# Patient Record
Sex: Female | Born: 1975
Health system: Southern US, Community
[De-identification: ages and names within clinical notes are randomized; demographics above are authoritative.]

## PROBLEM LIST (undated history)

## (undated) DIAGNOSIS — F32A Depression, unspecified: Secondary | ICD-10-CM

## (undated) DIAGNOSIS — E782 Mixed hyperlipidemia: Secondary | ICD-10-CM

## (undated) DIAGNOSIS — M542 Cervicalgia: Secondary | ICD-10-CM

## (undated) DIAGNOSIS — J309 Allergic rhinitis, unspecified: Secondary | ICD-10-CM

## (undated) DIAGNOSIS — I10 Essential (primary) hypertension: Secondary | ICD-10-CM

## (undated) DIAGNOSIS — G43909 Migraine, unspecified, not intractable, without status migrainosus: Secondary | ICD-10-CM

## (undated) DIAGNOSIS — Z8711 Personal history of peptic ulcer disease: Secondary | ICD-10-CM

## (undated) DIAGNOSIS — E538 Deficiency of other specified B group vitamins: Secondary | ICD-10-CM

## (undated) DIAGNOSIS — K219 Gastro-esophageal reflux disease without esophagitis: Secondary | ICD-10-CM

## (undated) DIAGNOSIS — E611 Iron deficiency: Secondary | ICD-10-CM

## (undated) DIAGNOSIS — R569 Unspecified convulsions: Secondary | ICD-10-CM

## (undated) DIAGNOSIS — R55 Syncope and collapse: Secondary | ICD-10-CM

## (undated) DIAGNOSIS — Z8619 Personal history of other infectious and parasitic diseases: Secondary | ICD-10-CM

## (undated) DIAGNOSIS — F419 Anxiety disorder, unspecified: Secondary | ICD-10-CM

## (undated) DIAGNOSIS — K909 Intestinal malabsorption, unspecified: Secondary | ICD-10-CM

## (undated) DIAGNOSIS — E559 Vitamin D deficiency, unspecified: Secondary | ICD-10-CM

## (undated) DIAGNOSIS — F329 Major depressive disorder, single episode, unspecified: Secondary | ICD-10-CM

## (undated) DIAGNOSIS — E161 Other hypoglycemia: Secondary | ICD-10-CM

## (undated) DIAGNOSIS — Z8719 Personal history of other diseases of the digestive system: Secondary | ICD-10-CM

## (undated) DIAGNOSIS — G8929 Other chronic pain: Secondary | ICD-10-CM

## (undated) DIAGNOSIS — E162 Hypoglycemia, unspecified: Secondary | ICD-10-CM

## (undated) DIAGNOSIS — R4182 Altered mental status, unspecified: Secondary | ICD-10-CM

## (undated) DIAGNOSIS — N926 Irregular menstruation, unspecified: Secondary | ICD-10-CM

## (undated) DIAGNOSIS — R238 Other skin changes: Secondary | ICD-10-CM

## (undated) HISTORY — DX: Altered mental status, unspecified: R41.82

## (undated) HISTORY — DX: Other hypoglycemia: E16.1

## (undated) HISTORY — DX: Mixed hyperlipidemia: E78.2

## (undated) HISTORY — PX: EYE SURGERY: SHX253

## (undated) HISTORY — PX: ENDOMETRIAL BIOPSY: SHX622

## (undated) HISTORY — DX: Intestinal malabsorption, unspecified: K90.9

## (undated) HISTORY — DX: Gastro-esophageal reflux disease without esophagitis: K21.9

## (undated) HISTORY — DX: Deficiency of other specified B group vitamins: E53.8

## (undated) HISTORY — DX: Irregular menstruation, unspecified: N92.6

## (undated) HISTORY — DX: Personal history of other infectious and parasitic diseases: Z86.19

## (undated) HISTORY — DX: Migraine, unspecified, not intractable, without status migrainosus: G43.909

## (undated) HISTORY — DX: Iron deficiency: E61.1

## (undated) HISTORY — DX: Unspecified convulsions: R56.9

## (undated) HISTORY — DX: Syncope and collapse: R55

## (undated) HISTORY — DX: Depression, unspecified: F32.A

## (undated) HISTORY — DX: Personal history of peptic ulcer disease: Z87.11

## (undated) HISTORY — DX: Hypoglycemia, unspecified: E16.2

## (undated) HISTORY — DX: Allergic rhinitis, unspecified: J30.9

## (undated) HISTORY — DX: Cervicalgia: M54.2

## (undated) HISTORY — PX: OTHER SURGICAL HISTORY: SHX169

## (undated) HISTORY — PX: TONSILLECTOMY AND ADENOIDECTOMY: SHX28

## (undated) HISTORY — DX: Major depressive disorder, single episode, unspecified: F32.9

## (undated) HISTORY — DX: Anxiety disorder, unspecified: F41.9

## (undated) HISTORY — DX: Essential (primary) hypertension: I10

## (undated) HISTORY — PX: COLONOSCOPY W/ POLYPECTOMY: SHX1380

## (undated) HISTORY — DX: Personal history of other diseases of the digestive system: Z87.19

## (undated) HISTORY — DX: Vitamin D deficiency, unspecified: E55.9

## (undated) HISTORY — DX: Other chronic pain: G89.29

## (undated) HISTORY — DX: Other skin changes: R23.8

## (undated) MED FILL — Medication: Fill #1 | Status: CN

---

## 1998-09-07 ENCOUNTER — Other Ambulatory Visit: Admission: RE | Admit: 1998-09-07 | Discharge: 1998-09-07 | Payer: Self-pay | Admitting: *Deleted

## 1998-10-25 ENCOUNTER — Encounter: Payer: Self-pay | Admitting: Obstetrics and Gynecology

## 1998-10-25 ENCOUNTER — Ambulatory Visit (HOSPITAL_COMMUNITY): Admission: RE | Admit: 1998-10-25 | Discharge: 1998-10-25 | Payer: Self-pay | Admitting: Obstetrics and Gynecology

## 1999-03-07 ENCOUNTER — Inpatient Hospital Stay (HOSPITAL_COMMUNITY): Admission: AD | Admit: 1999-03-07 | Discharge: 1999-03-07 | Payer: Self-pay | Admitting: Obstetrics and Gynecology

## 1999-03-07 ENCOUNTER — Encounter: Payer: Self-pay | Admitting: Obstetrics and Gynecology

## 1999-03-15 ENCOUNTER — Encounter (HOSPITAL_COMMUNITY): Admission: RE | Admit: 1999-03-15 | Discharge: 1999-03-26 | Payer: Self-pay | Admitting: Obstetrics and Gynecology

## 1999-03-25 ENCOUNTER — Inpatient Hospital Stay (HOSPITAL_COMMUNITY): Admission: AD | Admit: 1999-03-25 | Discharge: 1999-03-27 | Payer: Self-pay | Admitting: Obstetrics and Gynecology

## 1999-03-25 ENCOUNTER — Encounter (INDEPENDENT_AMBULATORY_CARE_PROVIDER_SITE_OTHER): Payer: Self-pay | Admitting: Specialist

## 2000-03-28 ENCOUNTER — Other Ambulatory Visit: Admission: RE | Admit: 2000-03-28 | Discharge: 2000-03-28 | Payer: Self-pay | Admitting: Obstetrics and Gynecology

## 2000-10-07 HISTORY — PX: GASTRIC BYPASS: SHX52

## 2001-04-01 ENCOUNTER — Other Ambulatory Visit: Admission: RE | Admit: 2001-04-01 | Discharge: 2001-04-01 | Payer: Self-pay | Admitting: Obstetrics and Gynecology

## 2001-04-10 ENCOUNTER — Encounter: Payer: Self-pay | Admitting: Obstetrics and Gynecology

## 2001-04-10 ENCOUNTER — Encounter: Admission: RE | Admit: 2001-04-10 | Discharge: 2001-04-10 | Payer: Self-pay | Admitting: Obstetrics and Gynecology

## 2002-01-05 HISTORY — PX: ROUX-EN-Y GASTRIC BYPASS: SHX1104

## 2002-04-01 ENCOUNTER — Other Ambulatory Visit: Admission: RE | Admit: 2002-04-01 | Discharge: 2002-04-01 | Payer: Self-pay | Admitting: Obstetrics and Gynecology

## 2003-03-09 ENCOUNTER — Other Ambulatory Visit: Admission: RE | Admit: 2003-03-09 | Discharge: 2003-03-09 | Payer: Self-pay | Admitting: Obstetrics and Gynecology

## 2004-03-13 ENCOUNTER — Other Ambulatory Visit: Admission: RE | Admit: 2004-03-13 | Discharge: 2004-03-13 | Payer: Self-pay | Admitting: Obstetrics and Gynecology

## 2004-06-27 ENCOUNTER — Ambulatory Visit (HOSPITAL_COMMUNITY): Admission: RE | Admit: 2004-06-27 | Discharge: 2004-06-27 | Payer: Self-pay | Admitting: Obstetrics and Gynecology

## 2004-10-07 HISTORY — PX: TUBAL LIGATION: SHX77

## 2004-10-07 HISTORY — PX: OTHER SURGICAL HISTORY: SHX169

## 2004-10-07 LAB — HM MAMMOGRAPHY: HM Mammogram: NORMAL

## 2004-11-06 ENCOUNTER — Inpatient Hospital Stay (HOSPITAL_COMMUNITY): Admission: AD | Admit: 2004-11-06 | Discharge: 2004-11-10 | Payer: Self-pay | Admitting: Obstetrics and Gynecology

## 2004-11-07 ENCOUNTER — Encounter (INDEPENDENT_AMBULATORY_CARE_PROVIDER_SITE_OTHER): Payer: Self-pay | Admitting: *Deleted

## 2004-11-11 ENCOUNTER — Encounter: Admission: RE | Admit: 2004-11-11 | Discharge: 2004-12-11 | Payer: Self-pay | Admitting: Obstetrics and Gynecology

## 2004-11-16 ENCOUNTER — Encounter: Admission: RE | Admit: 2004-11-16 | Discharge: 2004-11-16 | Payer: Self-pay | Admitting: Obstetrics and Gynecology

## 2005-03-15 ENCOUNTER — Other Ambulatory Visit: Admission: RE | Admit: 2005-03-15 | Discharge: 2005-03-15 | Payer: Self-pay | Admitting: Obstetrics and Gynecology

## 2005-06-08 ENCOUNTER — Encounter (INDEPENDENT_AMBULATORY_CARE_PROVIDER_SITE_OTHER): Payer: Self-pay | Admitting: Specialist

## 2005-06-08 ENCOUNTER — Inpatient Hospital Stay (HOSPITAL_COMMUNITY): Admission: RE | Admit: 2005-06-08 | Discharge: 2005-06-10 | Payer: Self-pay | Admitting: Obstetrics and Gynecology

## 2005-06-24 ENCOUNTER — Inpatient Hospital Stay (HOSPITAL_COMMUNITY): Admission: EM | Admit: 2005-06-24 | Discharge: 2005-06-24 | Payer: Self-pay | Admitting: Diagnostic Radiology

## 2006-08-04 ENCOUNTER — Other Ambulatory Visit: Admission: RE | Admit: 2006-08-04 | Discharge: 2006-08-04 | Payer: Self-pay | Admitting: Family Medicine

## 2007-05-13 ENCOUNTER — Other Ambulatory Visit: Admission: RE | Admit: 2007-05-13 | Discharge: 2007-05-13 | Payer: Self-pay | Admitting: Family Medicine

## 2008-08-19 ENCOUNTER — Other Ambulatory Visit: Admission: RE | Admit: 2008-08-19 | Discharge: 2008-08-19 | Payer: Self-pay | Admitting: Family Medicine

## 2009-07-04 ENCOUNTER — Emergency Department (HOSPITAL_COMMUNITY): Admission: EM | Admit: 2009-07-04 | Discharge: 2009-07-04 | Payer: Self-pay | Admitting: Emergency Medicine

## 2009-08-21 ENCOUNTER — Other Ambulatory Visit: Admission: RE | Admit: 2009-08-21 | Discharge: 2009-08-21 | Payer: Self-pay | Admitting: Family Medicine

## 2009-10-07 DIAGNOSIS — R238 Other skin changes: Secondary | ICD-10-CM

## 2009-10-07 DIAGNOSIS — R233 Spontaneous ecchymoses: Secondary | ICD-10-CM

## 2009-10-07 HISTORY — DX: Spontaneous ecchymoses: R23.3

## 2009-10-07 HISTORY — DX: Other skin changes: R23.8

## 2009-12-05 ENCOUNTER — Ambulatory Visit: Payer: Self-pay | Admitting: Oncology

## 2009-12-13 LAB — CBC WITH DIFFERENTIAL/PLATELET
BASO%: 0.4 % (ref 0.0–2.0)
EOS%: 1.1 % (ref 0.0–7.0)
HGB: 12.3 g/dL (ref 11.6–15.9)
LYMPH%: 30.7 % (ref 14.0–49.7)
MCV: 86.4 fL (ref 79.5–101.0)
MONO#: 0.6 10*3/uL (ref 0.1–0.9)
MONO%: 7.2 % (ref 0.0–14.0)
NEUT#: 4.8 10*3/uL (ref 1.5–6.5)
Platelets: 253 10*3/uL (ref 145–400)
RBC: 4.2 10*6/uL (ref 3.70–5.45)
RDW: 14.3 % (ref 11.2–14.5)
WBC: 8 10*3/uL (ref 3.9–10.3)
lymph#: 2.5 10*3/uL (ref 0.9–3.3)

## 2009-12-13 LAB — APTT: aPTT: 34 seconds (ref 24–37)

## 2009-12-15 LAB — ABO AND RH: Rh Type: POSITIVE

## 2009-12-20 LAB — VON WILLEBRAND FACTOR MULTIMER
Ristocetin Co-Factor: 71 % (ref 42–200)
Von Willebrand Factor Ag: 105 % (ref 50–217)

## 2010-08-20 ENCOUNTER — Other Ambulatory Visit: Admission: RE | Admit: 2010-08-20 | Discharge: 2010-08-20 | Payer: Self-pay | Admitting: Family Medicine

## 2010-10-27 ENCOUNTER — Encounter: Payer: Self-pay | Admitting: Obstetrics and Gynecology

## 2010-10-28 ENCOUNTER — Encounter: Payer: Self-pay | Admitting: Family Medicine

## 2011-02-22 NOTE — Consult Note (Signed)
Toni [name], Toni [name]                 ACCOUNT NO.:  [id number]   MEDICAL RECORD NO.:  [id number]          PATIENT TYPE:  INP   LOCATION:  1407                         FACILITY:  [facility]   PHYSICIAN:  Althea Grimmer. Santogade, M.D.DATE OF BIRTH:  03/16/1976   DATE OF CONSULTATION:  06/23/2005  DATE OF DISCHARGE:                                   CONSULTATION   [name] is a 35 year old female who presents to the emergency room with  three episodes of hematemesis and four episodes of melena in the past 24  hours. She also says she has some lower abdominal discomfort but does not  really complain of upper abdominal pain; however, she had a tubal ligation  earlier this month and has been taking ibuprofen for the discomfort from the  surgery. She has never had a history of an ulcer or gastrointestinal  bleeding before; however, she is status post laparoscopic gastric bypass  surgery which resulted in a 160 pound weight loss several years ago. At the  current time, she has not had any melena since 10 a.m. and her hemoglobin is  11.9 and her BUN 18. She is not orthostatic, though she says she has felt  somewhat lightheaded.   PAST MEDICAL HISTORY:  1.  Pertinent for morbid obesity.  2.  Multiparity.  3.  Recent tubal ligation.  4.  She has also had a cesarean section.  5.  Gastric bypass surgery.   MEDICATIONS:  Micronor, Zoloft 50 mg daily, Citracal, and Sudafed.   ALLERGIES:  None reported.   FAMILY HISTORY:  Grandmother had colon cancer.   SOCIAL HISTORY:  Married. No alcohol or tobacco use.   REVIEW OF SYSTEMS:  Negative except for above.   PHYSICAL EXAMINATION:  GENERAL:  She is in no acute distress.  VITAL SIGNS:  Afebrile. Pulse 74, blood pressure 106/66.  SKIN:  Normal.  HEENT:  Eyes:  Anicteric. Oropharynx:  Unremarkable.  NECK:  Supple without thyromegaly or adenopathy.  CHEST:  Sounds clear.  HEART:  Regular rate and rhythm.  ABDOMEN:  Soft with normal bowel sounds. There is  mild lower abdominal  tenderness to deep palpation. She is still overweight.  EXTREMITIES:  Without edema.   LABORATORY DATA:  Reveal hemoglobin of 11.9, platelet count 337,000, white  blood count 9.7. Prothrombin time is normal. Electrolyte normal. BUN 18.   IMPRESSION:  Upper gastrointestinal bleed in the face of ibuprofen use for  the last few weeks. Rule out ulcer, gastritis, or anastomotic ulcer.   PLAN:  Keep the patient n.p.o., Protonix IV, schedule for endoscopy early in  the morning which is discussed with the patient and reviewed in terms of  technique, preparation, and risks of complications including bleeding and  perforation. She agrees to proceed. Please see the orders.      Althea Grimmer. Luther Parody, M.D.  Electronically Signed     PJS/MEDQ  D:  06/24/2005  T:  06/24/2005  Job:  [id number]   cc:   Jethro Bastos, M.D.  [address]  [address]  [place]  [place] [address]  Fax: [phone]   Shirley Friar, MD  Fax: [phone]   Jackie Plum, M.D.   Naima A. Normand Sloop, M.D.  Fax: [phone]

## 2011-02-22 NOTE — Op Note (Signed)
Toni [name], Toni [name]                 ACCOUNT NO.:  [id number]   MEDICAL RECORD NO.:  [id number]          PATIENT TYPE:  INP   LOCATION:  1407                         FACILITY:  [facility]   PHYSICIAN:  Shirley Friar, MDDATE OF BIRTH:  [birth year]   DATE OF PROCEDURE:  06/24/2005  DATE OF DISCHARGE:  06/24/2005                                 OPERATIVE REPORT   PROCEDURE:  Upper endoscopy.   PREOPERATIVE DIAGNOSIS:  Melena and hematemesis.   POSTOPERATIVE DIAGNOSIS:  Anastomotic ulcer.   HISTORY:  The patient is a 35 year old female, who presented on June 23, 2005, with multiple episodes of hematemesis and melena in the setting of  a gastric bypass several years ago, who recently started taking ibuprofen  twice a day x 2 weeks due to a recent bilateral tubal ligation.  She was  hemodynamically stable on admission and also had a hemoglobin of 11.9 and  BUN of 18.  The patient was placed on Protonix IV and placed NPO for this  procedure to evaluate for anastomotic ulcer.   SEDATIONS:  The patient received 60 mg of Demerol IV and 5 mg of Versed IV.   PROCEDURE:  Informed consent was obtained from the patient prior to  initiation of procedure.   FINDINGS:  Endoscope was inserted into the oropharynx, and esophagus was  intubated without difficulty.  The endoscope was passed down into the  gastric pouch, and careful evaluation in this area revealed a clean-based,  circumferential, ulcerated anastomosis in the immediate area of intact  sutures.  The endoscope was then advanced down into both limbs of the small  intestines which were patent without any bleeding.  The endoscope was  withdrawn back into the gastric remnant and, again, the anastomotic ulcer  was noted which was clean-based without any bleeding or visible vessel  noted.  The stomach otherwise was normal in appearance without any bleeding.  The endoscope was withdrawn into the esophagus where the esophagogastric  junction was noted to be approximately 37 cm from the incisors, and this  area was free of ulcers or erythema.  The endoscope was withdrawn and  confirmed the above findings.   DIAGNOSES:  Anastomotic ulcer, likely secondary to nonsteroidal anti-  inflammatory drugs.   PLAN:  1.  Change Protonix to p.o. twice daily.  2.  Avoid nonsteroidal anti-inflammatory drugs.  3.  Clear today and advance as tolerated.      Shirley Friar, MD  Electronically Signed     Shirley Friar, MD  Electronically Signed    VCS/MEDQ  D:  06/24/2005  T:  06/24/2005  Job:  [id number]

## 2011-02-22 NOTE — H&P (Signed)
Toni [name], Toni [name]                 ACCOUNT NO.:  [id number]   MEDICAL RECORD NO.:  [id number]          PATIENT TYPE:  INP   LOCATION:  1407                         [facility]ACILITY:  [facility]   PHYSICIAN:  Jackie Plum, M.D.DATE OF BIRTH:  01-11-1976   DATE OF ADMISSION:  06/23/2005  DATE OF DISCHARGE:                                HISTORY & PHYSICAL   CHIEF COMPLAINT:  Hematemesis.   HISTORY OF PRESENT ILLNESS:  The patient is a 35 year old lady who presents  with above complaint. The patient has cesarean section 7 months ago and  delivered her baby by a gynecologist and went back for tubal ligation 2  weeks ago. Post tubal ligation she required nonsteroidal analgesics for pain  control. She came to the ED because she vomited bright red blood a couple of  times today, as well as dark stools noted. At the emergency room, the  patient had basic laboratory work done and hospitalist service was asked to  evaluate for admission. The patient denies any chest pain, fever, chills,  cough, sputum production, nausea, or vomiting. She denies any constipation  or diarrhea. She indicated that she is feeling a little bit dizzy,  especially on sitting up. She had some melena without any bright red blood  stools.   PAST MEDICAL HISTORY:  The patient denies any history of diabetes or  hypertension. She does not have any significant previous medical history.   MEDICATIONS:  She has no known medication allergies. The patient's  medications include calcium citrate, multivitamins, Percocet, Zoloft, and  ibuprofen.   FAMILY HISTORY:  Negative for history of strokes or bowel cancers.   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 Does not smoke cigarettes, does not drink alcohol.   REVIEW OF SYSTEMS:  As noted above in the HPI; otherwise, unremarkable.   PHYSICAL EXAMINATION ON ADMISSION:  VITAL SIGNS:  Blood pressure 106/56,  pulse 74, respirations 18,  temperature 97.2 degrees Fahrenheit.  GENERAL:  The patient was not in any acute cardiopulmonary distress.  HEENT:  Normocephalic, atraumatic. Pupils were equal, round, reacted to  light. Extraocular movements were intact. Oropharynx moist. She had mild  conjunctival pallor, no icterus.  NECK:  Supple, no JVD.  LUNGS:  Clear to auscultation.  CARDIAC:  Regular rate and rhythm. No gallops or murmur.  ABDOMEN:  Soft. She has mild epigastric tenderness. No rebound or guarding.  EXTREMITIES:  No cyanosis.  CENTRAL NERVOUS SYSTEM:  Nonfocal.   LABORATORY WORK:  Wbc count 9.6, hemoglobin 11.9, hematocrit 35.8, MCV 86.8,  platelet count 337. Urine pregnancy test was negative. Color yellow,  appearance clear, specific gravity of 1.015, pH 6.0, glucose negative,  moderate hemoglobin present, negative bilirubin, negative ketones, negative  total protein, negative nitrite, negative leukocyte esterase, wbc's 0-2 per  high-power field. PT 13.8, INR 1.0, PTT 31 seconds. Sodium 139, potassium  3.8, chloride 104, CO2 26, glucose 89, BUN 18, creatinine 0.7, calcium 8.7,  total protein 6.2, albumin 3.4, AST 18, ALT 13, alkaline phosphatase 94,  total bilirubin 0.8, lipase 27.  IMPRESSION:  1.  Gastrointestinal bleed, likely upper gastrointestinal bleed secondary to      nonsteroidal antiinflammatory drug gastropathy.  2.  Anemia of blood loss.   She will be admitted for IV fluids, management of her hemoglobin. She is  hemodynamically stable at this point and Dr. Roosvelt Harps has been  consulted.      Jackie Plum, M.D.  Electronically Signed     GO/MEDQ  D:  06/24/2005  T:  06/24/2005  Job:  [id number]   cc:   Althea Grimmer. Luther Parody, M.D.  [address]  [place]  [place] [address]  Fax: [phone]

## 2011-02-22 NOTE — Discharge Summary (Signed)
[name], [name]                 ACCOUNT NO.:  [id number]   MEDICAL RECORD NO.:  [id number]          PATIENT TYPE:  INP   LOCATION:  9310                          FACILITY:  [facility]   PHYSICIAN:  Toni A. Dillard, M.D. DATE OF BIRTH:  11-14-1975   DATE OF ADMISSION:  06/08/2005  DATE OF DISCHARGE:  06/10/2005                                 DISCHARGE SUMMARY   ADMISSION DIAGNOSIS:  Multiparity, desires tubal ligation.   DISCHARGE DIAGNOSES:  Multiparity, desires tubal ligation, status post  attempted laparoscopy, mini laparotomy, bilateral tubal ligation and lysis  of adhesions.   DISCHARGE MEDICATIONS:  Motrin, Percocet and previous medications.   CONDITION ON DISCHARGE:  Stable.   HOSPITAL COURSE:  The patient underwent attempted diagnostic laparoscopy and  mini laparotomy, lysis of adhesions, for bilateral tubal ligation.  On  postoperative day #1, she remained afebrile with stable vital signs.  Abdomen was soft and nontender.  The patient was tolerating some clear  liquids.  Her hemoglobin was 13.2.  Previous hemoglobin was 13.4.  On  postoperative day #2, the patient was tolerating p.o., passing gas and  looked good bowel habits.  She was afebrile with stable vital signs.  Abdomen was soft and nontender.  Minimal vaginal bleeding.  The patient was  deemed to benefit from her hospital stay and will be discharged home.   FOLLOW UP:  She will follow up in four to six weeks at the office and was  given [facility] discharge instructions.      Toni [name], M.D.  Electronically Signed     NAD/MEDQ  D:  06/10/2005  T:  06/10/2005  Job:  [id number]

## 2011-08-26 ENCOUNTER — Other Ambulatory Visit (HOSPITAL_COMMUNITY)
Admission: RE | Admit: 2011-08-26 | Discharge: 2011-08-26 | Disposition: A | Discharge status: Home / Self Care | Payer: BC Managed Care – PPO | Source: Ambulatory Visit | Attending: Family Medicine | Admitting: Family Medicine

## 2011-08-26 ENCOUNTER — Other Ambulatory Visit: Payer: Self-pay | Admitting: Family Medicine

## 2011-08-26 DIAGNOSIS — Z124 Encounter for screening for malignant neoplasm of cervix: Secondary | ICD-10-CM | POA: Insufficient documentation

## 2011-08-26 DIAGNOSIS — Z1159 Encounter for screening for other viral diseases: Secondary | ICD-10-CM | POA: Insufficient documentation

## 2013-10-07 DIAGNOSIS — R569 Unspecified convulsions: Secondary | ICD-10-CM

## 2013-10-07 HISTORY — DX: Unspecified convulsions: R56.9

## 2013-10-30 ENCOUNTER — Encounter (HOSPITAL_COMMUNITY): Payer: Self-pay | Admitting: Emergency Medicine

## 2013-10-30 ENCOUNTER — Emergency Department (HOSPITAL_COMMUNITY)
Admission: EM | Admit: 2013-10-30 | Discharge: 2013-10-30 | Disposition: A | Discharge status: Home / Self Care | Payer: BC Managed Care – PPO | Source: Home / Self Care | Attending: Emergency Medicine | Admitting: Emergency Medicine

## 2013-10-30 DIAGNOSIS — S61209A Unspecified open wound of unspecified finger without damage to nail, initial encounter: Secondary | ICD-10-CM

## 2013-10-30 DIAGNOSIS — S61019A Laceration without foreign body of unspecified thumb without damage to nail, initial encounter: Secondary | ICD-10-CM

## 2013-10-30 MED ORDER — HYDROCODONE-ACETAMINOPHEN 5-325 MG PO TABS: ORAL_TABLET | ORAL | Status: DC

## 2013-10-30 NOTE — ED Provider Notes (Signed)
Chief Complaint:   Chief Complaint  Patient presents with  . Laceration    History of Present Illness:   Toni [name] is a 38 year old female who lacerated her left thumb on a knife at home this afternoon. Her last tetanus shot was about 5 years ago and she has a full range of motion of all her joints and denies any numbness or tingling.  Review of Systems:  Other than noted above, the patient denies any of the following symptoms: Systemic:  No fever or chills. Musculoskeletal:  No joint pain or decreased range of motion. Neuro:  No numbness, tingling, or weakness.  PMFSH:  Past medical history, family history, social history, meds, and allergies were reviewed.   Physical Exam:   Vital signs:  BP 150/98  Pulse 108  Temp(Src) 98.7 F (37.1 C) (Oral)  Resp 18  SpO2 99%  LMP 10/04/2013 Ext:  There is a 1 cm laceration on the volar aspect of the left thumb, just proximal to the interphalangeal joint crease. This is shallow and does not involve the tendon. She has full flexion of the thumb and intact sensation at the tip of the thumb as well as good capillary refill.  All other joints had a full ROM without pain.  Pulses were full.  Good capillary refill in all digits.  No edema. Neurological:  Alert and oriented.  No muscle weakness.  Sensation was intact to light touch.   Procedure: Verbal informed consent was obtained.  The patient was informed of the risks and benefits of the procedure and understands and accepts.  Identity of the patient was verified verbally and by wristband.   The laceration area described above was prepped with Betadine and saline  and anesthetized with a digital block with 5 mL of 2% Xylocaine without epinephrine.  The wound was then closed as follows:  Skin edges were approximated with 3 5-0 nylon sutures.  There were no immediate complications, and the patient tolerated the procedure well. The laceration was then cleansed, Bacitracin ointment was applied and a  clean, dry pressure dressing was put on.   Assessment:  The encounter diagnosis was Thumb laceration.  Plan:   1.  Meds:  The following meds were prescribed:   Discharge Medication List as of 10/30/2013  6:06 PM    START taking these medications   Details  HYDROcodone-acetaminophen (NORCO/VICODIN) 5-325 MG per tablet 1 to 2 tabs every 4 to 6 hours as needed for pain., Print        2.  Patient Education/Counseling:  The patient was given appropriate handouts, self care instructions, and instructed in symptomatic relief. Instructions were given for wound care.    3.  Follow up:  The patient was told to follow up immediately if there is any sign of infection.The patient will return in 14 days for suture removal.      Reuben Likesavid C Ubaldo Daywalt, MD 10/30/13 2116

## 2013-10-30 NOTE — ED Notes (Signed)
Pt reports laceration to left thumb onset 30 minutes ago cutting potatoes Laceration is 2 cm; bleeding controlled; tetanus w/in 5 yrs Alert w/no signs of acute distress.

## 2013-10-30 NOTE — Discharge Instructions (Signed)

## 2013-11-03 ENCOUNTER — Telehealth (HOSPITAL_COMMUNITY): Payer: Self-pay | Admitting: *Deleted

## 2013-11-03 NOTE — ED Notes (Signed)
Pt. called on VM 1/26, and asked for a refill of her Hydrocodone for thumb laceration. Got #20 on 1/24.  Discussed with Dr. Lorenz CoasterKeller and he said no, if it is hurting that bad, she needs to come back for a recheck. I called pt. back and left a message on her voicemail telling her this and to call back if any questions. Vassie MoselleYork, Adysson Revelle M 11/03/2013

## 2014-06-22 ENCOUNTER — Encounter: Payer: Self-pay | Admitting: Neurology

## 2014-06-22 ENCOUNTER — Ambulatory Visit (INDEPENDENT_AMBULATORY_CARE_PROVIDER_SITE_OTHER): Payer: BLUE CROSS/BLUE SHIELD | Admitting: Radiology

## 2014-06-22 ENCOUNTER — Encounter (INDEPENDENT_AMBULATORY_CARE_PROVIDER_SITE_OTHER): Payer: Self-pay

## 2014-06-22 ENCOUNTER — Ambulatory Visit (INDEPENDENT_AMBULATORY_CARE_PROVIDER_SITE_OTHER): Payer: BLUE CROSS/BLUE SHIELD | Admitting: Neurology

## 2014-06-22 VITALS — BP 136/87 | HR 67 | Ht 67.0 in | Wt 260.0 lb

## 2014-06-22 DIAGNOSIS — R569 Unspecified convulsions: Secondary | ICD-10-CM | POA: Diagnosis not present

## 2014-06-22 DIAGNOSIS — G43909 Migraine, unspecified, not intractable, without status migrainosus: Secondary | ICD-10-CM | POA: Insufficient documentation

## 2014-06-22 DIAGNOSIS — R55 Syncope and collapse: Secondary | ICD-10-CM | POA: Insufficient documentation

## 2014-06-22 NOTE — Progress Notes (Signed)
PATIENT: Toni [name] DOB: 20-Apr-1976  HISTORICAL  Toni [name] is a 89 years right-handed female, is referred by her primary care PA Carilyn Goodpasture, accompanied by her fianc and mother at today's clinical visit.  She suffered a seizure in June 18 2014, she drove overnight to help her cousin morning, early morning, while moving boxes to [employer], she felt dizziness, lightheadedness, woke up on the ambulance, this was at [place], this seizure was witnessed by her fianc, saw her felt to the floor, whole-body jerking movement, lasting for few minutes, bilateral tongue biting, urinary incontinence, followed by post event confusion. She now complains of body achy pain, bilateral calf muscle cramping post seizure event.  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  She had a history of obesity, in 2002, with more than 100 pound weight loss,   REVIEW OF SYSTEMS: Full 14 system review of systems performed and notable only for anemia, easy bruising, feeling cold, memory loss, headaches, passing out  ALLERGIES: No Known Allergies  HOME MEDICATIONS: Current Outpatient Prescriptions on File Prior to Visit  Medication Sig Dispense Refill  . HYDROcodone-acetaminophen (NORCO/VICODIN) 5-325 MG per tablet 1 to 2 tabs every 4 to 6 hours as needed for pain.  20 tablet  0     PAST MEDICAL HISTORY: No past medical history on file.  PAST SURGICAL HISTORY: Past Surgical History  Procedure Laterality Date  . Gastric bypass  2002    FAMILY HISTORY:  Mother: COPD, DM. Asthma  SOCIAL HISTORY:  History   Social History  . Marital Status: Married    Spouse Name: N/A    Number of Children: 2  . Years of Education: N/A   Occupational History  . 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   Social History Main  Topics  . Smoking status: Never Smoker   . Smokeless tobacco: Not on file  . Alcohol Use: No  . Drug Use: No  . Sexual Activity: Not on file   Other Topics Concern  . Not on file   Social History Narrative  . No narrative on file   PHYSICAL EXAM   Filed Vitals:   06/22/14 1004  BP: 136/87  Pulse: 67  Height:  (1.702 m)  Weight: 260 lb (117.935 kg)    Not recorded    Body mass index is 40.71 kg/(m^2).   Generalized: In no acute distress  Neck: Supple, no carotid bruits   Cardiac: Regular rate rhythm  Pulmonary: Clear to auscultation bilaterally  Musculoskeletal: No deformity  Neurological examination  Mentation: Alert oriented to time, place, history taking, and causual conversation, obese.  Cranial nerve II-XII: Pupils were equal round reactive to light. Extraocular movements were full.  Visual field were full on confrontational test. Bilateral fundi were sharp.  Facial sensation and strength were normal. Hearing was intact to finger rubbing bilaterally. Uvula tongue midline.  Head turning and shoulder shrug and were normal and symmetric.Tongue protrusion into cheek strength was normal.  Motor: Normal tone, bulk and strength.  Sensory: Intact to fine touch, pinprick, preserved vibratory sensation, and proprioception at toes.  Coordination: Normal finger to nose, heel-to-shin bilaterally there was no truncal ataxia  Gait: Rising up from seated position without assistance, normal stance, without trunk ataxia, moderate stride, good arm swing, smooth turning, able to perform tiptoe, and heel walking without  difficulty.   Romberg signs: Negative  Deep tendon reflexes: Brachioradialis 2/2, biceps 2/2, triceps 2/2, patellar 2/2, Achilles 2/2, plantar responses were flexor bilaterally.   DIAGNOSTIC DATA (LABS, IMAGING, TESTING) - I reviewed patient records, labs, notes, testing and imaging myself where available.  Lab Results  Component Value Date   WBC 8.0  12/13/2009   HGB 12.3 12/13/2009   HCT 36.3 12/13/2009   MCV 86.4 12/13/2009   PLT 253 12/13/2009   ASSESSMENT AND PLAN  Toni [name] is a 38 y.o. female presenting with one generalized seizure in September 12, normal neurological examination, she is on polypharmacy treatment, including occasionally Xanax, trazodone, Zoloft,  1. Complete evaluation with MRI of brain with without contrast  2. EEG.  3. No driving until seizure free for 6 months, this is her first generalized seizure, will not put her on any antiepileptic medications.  4. Return to clinic in one month     Levert Feinstein, M.D. Ph.D.  [facility] [address] [place], [place] [address] [phone]

## 2014-06-22 NOTE — Patient Instructions (Signed)
No driving until seizure-free for 6 months 

## 2014-06-24 ENCOUNTER — Ambulatory Visit: Payer: BC Managed Care – PPO | Admitting: Neurology

## 2014-06-24 NOTE — Procedures (Signed)
   HISTORY: 37 years old female, presenting to his progressive memory loss for many years, episode of lost consciousness in September twelfth 20/15, was tongue biting, urinary incontinence  TECHNIQUE:  16 channel EEG was performed based on standard 10-16 international system. One channel was dedicated to EKG, which has demonstrates normal sinus rhythm of 72 beats per minutes.  Upon awakening, the posterior background activity was well-developed, in alpha range 10 Hz, reactive to eye opening and closure.  There was no evidence of epileptiform discharge.  Photic stimulation was performed, which induced a symmetric photic driving.  Hyperventilation was performed, there was no abnormality elicit.  Stage II sleep was achieved, as evident by K complex, sleep spindles.  CONCLUSION: This is a  normal awake and asleep EEG.  There is no electrodiagnostic evidence of epileptiform discharge

## 2014-06-29 ENCOUNTER — Telehealth: Payer: Self-pay | Admitting: Neurology

## 2014-06-29 ENCOUNTER — Other Ambulatory Visit: Payer: BC Managed Care – PPO

## 2014-06-29 NOTE — Telephone Encounter (Signed)
Patient requesting EEG results, states she has been waiting, please return call and advise.

## 2014-06-29 NOTE — Telephone Encounter (Signed)
I have called her for normal EEG.

## 2014-06-30 ENCOUNTER — Ambulatory Visit (INDEPENDENT_AMBULATORY_CARE_PROVIDER_SITE_OTHER): Payer: BLUE CROSS/BLUE SHIELD

## 2014-06-30 DIAGNOSIS — R569 Unspecified convulsions: Secondary | ICD-10-CM

## 2014-06-30 MED ORDER — GADOPENTETATE DIMEGLUMINE 469.01 MG/ML IV SOLN
20.0000 mL | Freq: Once | INTRAVENOUS | Status: AC | PRN
Start: 2014-06-30 — End: 2014-06-30

## 2014-07-05 ENCOUNTER — Telehealth: Payer: Self-pay | Admitting: Neurology

## 2014-07-05 NOTE — Telephone Encounter (Signed)
Patient calling for MRI results.  Please call anytime and may leave detailed message on voicemail. °

## 2014-07-06 NOTE — Telephone Encounter (Signed)
I have called her normal MRI brain. Normal EEG.

## 2014-07-06 NOTE — Telephone Encounter (Signed)
Patient calling back regarding MRI results, very Anxious.  Please call and advise

## 2014-07-08 ENCOUNTER — Telehealth: Payer: Self-pay | Admitting: *Deleted

## 2014-07-08 NOTE — Telephone Encounter (Signed)
Spoke with Toni [name] and informed her of normal MRI of the brain, Toni [name] verbalized understanding. Toni [name] also wanted appointment cancelled for 07/25/14 with Dr Terrace ArabiaYan, states that she did not need it at this time and will call back and r/s at a later time.

## 2014-07-25 ENCOUNTER — Ambulatory Visit: Payer: BC Managed Care – PPO | Admitting: Neurology

## 2014-08-18 ENCOUNTER — Ambulatory Visit: Payer: BC Managed Care – PPO

## 2014-10-24 ENCOUNTER — Ambulatory Visit: Payer: BC Managed Care – PPO | Admitting: Family

## 2014-11-07 LAB — HM PAP SMEAR: HM Pap smear: NORMAL

## 2014-11-25 ENCOUNTER — Telehealth: Payer: Self-pay | Admitting: Family Medicine

## 2014-11-25 NOTE — Telephone Encounter (Signed)
Patient was suppose to establish with Padonda on 12/02/14 but has to cancel.  She took time off work for the appointment and needs to have a new patient appointment on 12/02/14 due to her already approved requested time off.  Please advise if we can accommodate the patient and work her in for a new patient appointment on 12/02/14.

## 2014-11-28 NOTE — Telephone Encounter (Signed)
Patient is scheduled for 11:15 on 12/02/14.

## 2014-11-28 NOTE — Telephone Encounter (Signed)
We could put her on a wait list to see if anyone cancels is she is ok w/ that - I don't think we have any spots open that day now.

## 2014-11-28 NOTE — Telephone Encounter (Signed)
She didn't want that when I spoke to her.  She wanted an appointment for the 26 due to her work schedule.

## 2014-11-28 NOTE — Telephone Encounter (Addendum)
Ok to work in if two 15 minute appointments back to back to back.

## 2014-12-02 ENCOUNTER — Ambulatory Visit (INDEPENDENT_AMBULATORY_CARE_PROVIDER_SITE_OTHER): Payer: Self-pay | Admitting: Family Medicine

## 2014-12-02 ENCOUNTER — Ambulatory Visit: Payer: BC Managed Care – PPO | Admitting: Family

## 2014-12-02 DIAGNOSIS — R69 Illness, unspecified: Secondary | ICD-10-CM

## 2014-12-02 NOTE — Progress Notes (Signed)
NO SHOW FOR NEW PT VISIT - with very late cancel after insisting we work her in this date and time.

## 2015-03-31 ENCOUNTER — Encounter (HOSPITAL_COMMUNITY): Payer: Self-pay | Admitting: Emergency Medicine

## 2015-03-31 ENCOUNTER — Emergency Department (HOSPITAL_COMMUNITY)
Admission: EM | Admit: 2015-03-31 | Discharge: 2015-03-31 | Disposition: A | Discharge status: Home / Self Care | Payer: 59 | Source: Home / Self Care | Attending: Family Medicine | Admitting: Family Medicine

## 2015-03-31 DIAGNOSIS — S161XXA Strain of muscle, fascia and tendon at neck level, initial encounter: Secondary | ICD-10-CM

## 2015-03-31 LAB — POCT URINALYSIS DIP (DEVICE)
BILIRUBIN URINE: NEGATIVE
Glucose, UA: NEGATIVE mg/dL
Hgb urine dipstick: NEGATIVE
KETONES UR: NEGATIVE mg/dL
LEUKOCYTES UA: NEGATIVE
Nitrite: NEGATIVE
PH: 5.5 (ref 5.0–8.0)
Protein, ur: NEGATIVE mg/dL
Specific Gravity, Urine: >=1.03 (ref 1.005–1.030)
Urobilinogen, UA: 0.2 mg/dL (ref 0.0–1.0)

## 2015-03-31 LAB — POCT PREGNANCY, URINE: PREG TEST UR: NEGATIVE

## 2015-03-31 MED ORDER — OXYCODONE-ACETAMINOPHEN 5-325 MG PO TABS: 1.0000 | ORAL_TABLET | Freq: Three times a day (TID) | ORAL | Status: DC | PRN

## 2015-03-31 MED ORDER — METRONIDAZOLE 500 MG PO TABS
500.0000 mg | ORAL_TABLET | Freq: Two times a day (BID) | ORAL | Status: DC
Start: 2015-03-31 — End: 2015-04-21

## 2015-03-31 MED ORDER — METHOCARBAMOL 500 MG PO TABS: 500.0000 mg | ORAL_TABLET | Freq: Three times a day (TID) | ORAL | Status: DC | PRN

## 2015-03-31 NOTE — ED Notes (Signed)
Here for vag d/c onset 2 days... Hx of BV Denies fevers, chills, abd pain Also reports pain on right side of neck Alert, no signs of acute distress.

## 2015-03-31 NOTE — Discharge Instructions (Signed)
Thank you for coming in today. Come back or go to the emergency room if you notice new weakness new numbness problems walking or bowel or bladder problems. If your belly pain worsens, or you have high fever, bad vomiting, blood in your stool or black tarry stool go to the Emergency Room.  Follow up with your doctor for BV if not better.  Follow up with me for neck pain as needed.   Cervical Strain and Sprain (Whiplash) with Rehab Cervical strain and sprain are injuries that commonly occur with "whiplash" injuries. Whiplash occurs when the neck is forcefully whipped backward or forward, such as during a motor vehicle accident or during contact sports. The muscles, ligaments, tendons, discs, and nerves of the neck are susceptible to injury when this occurs. RISK FACTORS Risk of having a whiplash injury increases if:  Osteoarthritis of the spine.  Situations that make head or neck accidents or trauma more likely.  High-risk sports (football, rugby, wrestling, hockey, auto racing, gymnastics, diving, contact karate, or boxing).  Poor strength and flexibility of the neck.  Previous neck injury.  Poor tackling technique.  Improperly fitted or padded equipment. SYMPTOMS   Pain or stiffness in the front or back of neck or both.  Symptoms may present immediately or up to 24 hours after injury.  Dizziness, headache, nausea, and vomiting.  Muscle spasm with soreness and stiffness in the neck.  Tenderness and swelling at the injury site. PREVENTION  Learn and use proper technique (avoid tackling with the head, spearing, and head-butting; use proper falling techniques to avoid landing on the head).  Warm up and stretch properly before activity.  Maintain physical fitness:  Strength, flexibility, and endurance.  Cardiovascular fitness.  Wear properly fitted and padded protective equipment, such as padded soft collars, for participation in contact sports. PROGNOSIS  Recovery from  cervical strain and sprain injuries is dependent on the extent of the injury. These injuries are usually curable in 1 week to 3 months with appropriate treatment.  RELATED COMPLICATIONS   Temporary numbness and weakness may occur if the nerve roots are damaged, and this may persist until the nerve has completely healed.  Chronic pain due to frequent recurrence of symptoms.  Prolonged healing, especially if activity is resumed too soon (before complete recovery). TREATMENT  Treatment initially involves the use of ice and medication to help reduce pain and inflammation. It is also important to perform strengthening and stretching exercises and modify activities that worsen symptoms so the injury does not get worse. These exercises may be performed at home or with a therapist. For patients who experience severe symptoms, a soft, padded collar may be recommended to be worn around the neck.  Improving your posture may help reduce symptoms. Posture improvement includes pulling your chin and abdomen in while sitting or standing. If you are sitting, sit in a firm chair with your buttocks against the back of the chair. While sleeping, try replacing your pillow with a small towel rolled to 2 inches in diameter, or use a cervical pillow or soft cervical collar. Poor sleeping positions delay healing.  For patients with nerve root damage, which causes numbness or weakness, the use of a cervical traction apparatus may be recommended. Surgery is rarely necessary for these injuries. However, cervical strain and sprains that are present at birth (congenital) may require surgery. MEDICATION   If pain medication is necessary, nonsteroidal anti-inflammatory medications, such as aspirin and ibuprofen, or other minor pain relievers, such as acetaminophen, are  often recommended.  Do not take pain medication for 7 days before surgery.  Prescription pain relievers may be given if deemed necessary by your caregiver. Use  only as directed and only as much as you need. HEAT AND COLD:   Cold treatment (icing) relieves pain and reduces inflammation. Cold treatment should be applied for 10 to 15 minutes every 2 to 3 hours for inflammation and pain and immediately after any activity that aggravates your symptoms. Use ice packs or an ice massage.  Heat treatment may be used prior to performing the stretching and strengthening activities prescribed by your caregiver, physical therapist, or athletic trainer. Use a heat pack or a warm soak. SEEK MEDICAL CARE IF:   Symptoms get worse or do not improve in 2 weeks despite treatment.  New, unexplained symptoms develop (drugs used in treatment may produce side effects). EXERCISES RANGE OF MOTION (ROM) AND STRETCHING EXERCISES - Cervical Strain and Sprain These exercises may help you when beginning to rehabilitate your injury. In order to successfully resolve your symptoms, you must improve your posture. These exercises are designed to help reduce the forward-head and rounded-shoulder posture which contributes to this condition. Your symptoms may resolve with or without further involvement from your physician, physical therapist or athletic trainer. While completing these exercises, remember:   Restoring tissue flexibility helps normal motion to return to the joints. This allows healthier, less painful movement and activity.  An effective stretch should be held for at least 20 seconds, although you may need to begin with shorter hold times for comfort.  A stretch should never be painful. You should only feel a gentle lengthening or release in the stretched tissue. STRETCH- Axial Extensors  Lie on your back on the floor. You may bend your knees for comfort. Place a rolled-up hand towel or dish towel, about 2 inches in diameter, under the part of your head that makes contact with the floor.  Gently tuck your chin, as if trying to make a "double chin," until you feel a gentle  stretch at the base of your head.  Hold __________ seconds. Repeat __________ times. Complete this exercise __________ times per day.  STRETCH - Axial Extension   Stand or sit on a firm surface. Assume a good posture: chest up, shoulders drawn back, abdominal muscles slightly tense, knees unlocked (if standing) and feet hip width apart.  Slowly retract your chin so your head slides back and your chin slightly lowers. Continue to look straight ahead.  You should feel a gentle stretch in the back of your head. Be certain not to feel an aggressive stretch since this can cause headaches later.  Hold for __________ seconds. Repeat __________ times. Complete this exercise __________ times per day. STRETCH - Cervical Side Bend   Stand or sit on a firm surface. Assume a good posture: chest up, shoulders drawn back, abdominal muscles slightly tense, knees unlocked (if standing) and feet hip width apart.  Without letting your nose or shoulders move, slowly tip your right / left ear to your shoulder until your feel a gentle stretch in the muscles on the opposite side of your neck.  Hold __________ seconds. Repeat __________ times. Complete this exercise __________ times per day. STRETCH - Cervical Rotators   Stand or sit on a firm surface. Assume a good posture: chest up, shoulders drawn back, abdominal muscles slightly tense, knees unlocked (if standing) and feet hip width apart.  Keeping your eyes level with the ground, slowly turn your head  until you feel a gentle stretch along the back and opposite side of your neck.  Hold __________ seconds. Repeat __________ times. Complete this exercise __________ times per day. RANGE OF MOTION - Neck Circles   Stand or sit on a firm surface. Assume a good posture: chest up, shoulders drawn back, abdominal muscles slightly tense, knees unlocked (if standing) and feet hip width apart.  Gently roll your head down and around from the back of one shoulder  to the back of the other. The motion should never be forced or painful.  Repeat the motion 10-20 times, or until you feel the neck muscles relax and loosen. Repeat __________ times. Complete the exercise __________ times per day. STRENGTHENING EXERCISES - Cervical Strain and Sprain These exercises may help you when beginning to rehabilitate your injury. They may resolve your symptoms with or without further involvement from your physician, physical therapist, or athletic trainer. While completing these exercises, remember:   Muscles can gain both the endurance and the strength needed for everyday activities through controlled exercises.  Complete these exercises as instructed by your physician, physical therapist, or athletic trainer. Progress the resistance and repetitions only as guided.  You may experience muscle soreness or fatigue, but the pain or discomfort you are trying to eliminate should never worsen during these exercises. If this pain does worsen, stop and make certain you are following the directions exactly. If the pain is still present after adjustments, discontinue the exercise until you can discuss the trouble with your clinician. STRENGTH - Cervical Flexors, Isometric  Face a wall, standing about 6 inches away. Place a small pillow, a ball about 6-8 inches in diameter, or a folded towel between your forehead and the wall.  Slightly tuck your chin and gently push your forehead into the soft object. Push only with mild to moderate intensity, building up tension gradually. Keep your jaw and forehead relaxed.  Hold 10 to 20 seconds. Keep your breathing relaxed.  Release the tension slowly. Relax your neck muscles completely before you start the next repetition. Repeat __________ times. Complete this exercise __________ times per day. STRENGTH- Cervical Lateral Flexors, Isometric   Stand about 6 inches away from a wall. Place a small pillow, a ball about 6-8 inches in diameter,  or a folded towel between the side of your head and the wall.  Slightly tuck your chin and gently tilt your head into the soft object. Push only with mild to moderate intensity, building up tension gradually. Keep your jaw and forehead relaxed.  Hold 10 to 20 seconds. Keep your breathing relaxed.  Release the tension slowly. Relax your neck muscles completely before you start the next repetition. Repeat __________ times. Complete this exercise __________ times per day. STRENGTH - Cervical Extensors, Isometric   Stand about 6 inches away from a wall. Place a small pillow, a ball about 6-8 inches in diameter, or a folded towel between the back of your head and the wall.  Slightly tuck your chin and gently tilt your head back into the soft object. Push only with mild to moderate intensity, building up tension gradually. Keep your jaw and forehead relaxed.  Hold 10 to 20 seconds. Keep your breathing relaxed.  Release the tension slowly. Relax your neck muscles completely before you start the next repetition. Repeat __________ times. Complete this exercise __________ times per day. POSTURE AND BODY MECHANICS CONSIDERATIONS - Cervical Strain and Sprain Keeping correct posture when sitting, standing or completing your activities will reduce the  stress put on different body tissues, allowing injured tissues a chance to heal and limiting painful experiences. The following are general guidelines for improved posture. Your physician or physical therapist will provide you with any instructions specific to your needs. While reading these guidelines, remember:  The exercises prescribed by your provider will help you have the flexibility and strength to maintain correct postures.  The correct posture provides the optimal environment for your joints to work. All of your joints have less wear and tear when properly supported by a spine with good posture. This means you will experience a healthier, less painful  body.  Correct posture must be practiced with all of your activities, especially prolonged sitting and standing. Correct posture is as important when doing repetitive low-stress activities (typing) as it is when doing a single heavy-load activity (lifting). PROLONGED STANDING WHILE SLIGHTLY LEANING FORWARD When completing a task that requires you to lean forward while standing in one place for a long time, place either foot up on a stationary 2- to 4-inch high object to help maintain the best posture. When both feet are on the ground, the low back tends to lose its slight inward curve. If this curve flattens (or becomes too large), then the back and your other joints will experience too much stress, fatigue more quickly, and can cause pain.  RESTING POSITIONS Consider which positions are most painful for you when choosing a resting position. If you have pain with flexion-based activities (sitting, bending, stooping, squatting), choose a position that allows you to rest in a less flexed posture. You would want to avoid curling into a fetal position on your side. If your pain worsens with extension-based activities (prolonged standing, working overhead), avoid resting in an extended position such as sleeping on your stomach. Most people will find more comfort when they rest with their spine in a more neutral position, neither too rounded nor too arched. Lying on a non-sagging bed on your side with a pillow between your knees, or on your back with a pillow under your knees will often provide some relief. Keep in mind, being in any one position for a prolonged period of time, no matter how correct your posture, can still lead to stiffness. WALKING Walk with an upright posture. Your ears, shoulders, and hips should all line up. OFFICE WORK When working at a desk, create an environment that supports good, upright posture. Without extra support, muscles fatigue and lead to excessive strain on joints and other  tissues. CHAIR:  A chair should be able to slide under your desk when your back makes contact with the back of the chair. This allows you to work closely.  The chair's height should allow your eyes to be level with the upper part of your monitor and your hands to be slightly lower than your elbows.  Body position:  Your feet should make contact with the floor. If this is not possible, use a foot rest.  Keep your ears over your shoulders. This will reduce stress on your neck and low back. Document Released: 09/23/2005 Document Revised: 02/07/2014 Document Reviewed: 01/05/2009 [facility] Patient Information 2015 [place], [place]. This information is not intended to replace advice given to you by your health care provider. Make sure you discuss any questions you have with your health care provider.   Bacterial Vaginosis Bacterial vaginosis is a vaginal infection that occurs when the normal balance of bacteria in the vagina is disrupted. It results from an overgrowth of certain bacteria. This  is the most common vaginal infection in women of childbearing age. Treatment is important to prevent complications, especially in pregnant women, as it can cause a premature delivery. CAUSES  Bacterial vaginosis is caused by an increase in harmful bacteria that are normally present in smaller amounts in the vagina. Several different kinds of bacteria can cause bacterial vaginosis. However, the reason that the condition develops is not fully understood. RISK FACTORS Certain activities or behaviors can put you at an increased risk of developing bacterial vaginosis, including:  Having a new sex partner or multiple sex partners.  Douching.  Using an intrauterine device (IUD) for contraception. Women do not get bacterial vaginosis from toilet seats, bedding, swimming pools, or contact with objects around them. SIGNS AND SYMPTOMS  Some women with bacterial vaginosis have no signs or symptoms. Common symptoms  include:  Grey vaginal discharge.  A fishlike odor with discharge, especially after sexual intercourse.  Itching or burning of the vagina and vulva.  Burning or pain with urination. DIAGNOSIS  Your health care provider will take a medical history and examine the vagina for signs of bacterial vaginosis. A sample of vaginal fluid may be taken. Your health care provider will look at this sample under a microscope to check for bacteria and abnormal cells. A vaginal pH test may also be done.  TREATMENT  Bacterial vaginosis may be treated with antibiotic medicines. These may be given in the form of a pill or a vaginal cream. A second round of antibiotics may be prescribed if the condition comes back after treatment.  HOME CARE INSTRUCTIONS   Only take over-the-counter or prescription medicines as directed by your health care provider.  If antibiotic medicine was prescribed, take it as directed. Make sure you finish it even if you start to feel better.  Do not have sex until treatment is completed.  Tell all sexual partners that you have a vaginal infection. They should see their health care provider and be treated if they have problems, such as a mild rash or itching.  Practice safe sex by using condoms and only having one sex partner. SEEK MEDICAL CARE IF:   Your symptoms are not improving after 3 days of treatment.  You have increased discharge or pain.  You have a fever. MAKE SURE YOU:   Understand these instructions.  Will watch your condition.  Will get help right away if you are not doing well or get worse. FOR MORE INFORMATION  Centers for Disease Control and Prevention, Division of STD Prevention: [employer] American Sexual Health Association (ASHA): www.ashastd.org  Document Released: 09/23/2005 Document Revised: 07/14/2013 Document Reviewed: 05/05/2013 [facility] Patient Information 2015 [place], [place]. This information is not intended to replace advice given to you  by your health care provider. Make sure you discuss any questions you have with your health care provider.

## 2015-03-31 NOTE — ED Provider Notes (Signed)
Toni [name] is a 39 y.o. female who presents to Urgent Care today for vaginal discharge and neck pain. Patient notes a 2 day history of vaginal discharge. The discharge is consistent with previous episodes of bacterial vaginosis. She denies any pain fevers chills nausea vomiting or diarrhea. She would like to avoid a pelvic exam if possible.  Additionally patient notes pain in her right trapezius. She has chronic intermittent pain in this area. She's had physical therapy in the past which has helped. She's tried some Tylenol which really is not helping. No radiating pain weakness or numbness. Pain is severe at times. Pain limits her activity.   Past Medical History  Diagnosis Date  . Seizure   . Syncope   . Migraine    Past Surgical History  Procedure Laterality Date  . Gastric bypass  2002  . Cesarean section  2006  . Tubuligation  2006  . Tonsillectomy and adenoidectomy    . Tubes in ears both Bilateral    History  Substance Use Topics  . Smoking status: Never Smoker   . Smokeless tobacco: Never Used  . Alcohol Use: No   ROS as above Medications: No current facility-administered medications for this encounter.   Current Outpatient Prescriptions  Medication Sig Dispense Refill  . Calcium 250 MG CAPS Take by mouth daily.    . Cholecalciferol (VITAMIN D) 2000 UNITS CAPS Take by mouth daily.    . hydrochlorothiazide (MICROZIDE) 12.5 MG capsule Take 12.5 mg by mouth daily.    Marland Kitchen ALPRAZolam (XANAX) 1 MG tablet Take 1 mg by mouth at bedtime as needed for anxiety. 1/2 -1 tablet oral once daily    . BIOTIN PO Take by mouth daily.    . Boric Acid POWD by Does not apply route.    . cetirizine (ZYRTEC) 10 MG tablet Take 10 mg by mouth daily.    . cyclobenzaprine (FLEXERIL) 10 MG tablet Take 10 mg by mouth 3 (three) times daily as needed for muscle spasms.    Marland Kitchen eletriptan (RELPAX) 40 MG tablet Take 40 mg by mouth as needed for migraine or headache. One tablet by mouth at onset of  headache. May repeat in 2 hours if headache persists or recurs.    . fluconazole (DIFLUCAN) 150 MG tablet 150 mg daily.    . fluticasone (FLONASE) 50 MCG/ACT nasal spray Place into both nostrils daily.    . methocarbamol (ROBAXIN) 500 MG tablet Take 1 tablet (500 mg total) by mouth every 8 (eight) hours as needed for muscle spasms. 30 tablet 0  . metroNIDAZOLE (FLAGYL) 500 MG tablet Take 1 tablet (500 mg total) by mouth 2 (two) times daily. 14 tablet 0  . Omega-3 Fatty Acids (FISH OIL) 1000 MG CAPS Take by mouth daily.    Marland Kitchen omeprazole (PRILOSEC) 20 MG capsule Take 20 mg by mouth daily.    . ondansetron (ZOFRAN) 8 MG tablet Take by mouth every 8 (eight) hours as needed for nausea or vomiting.    Marland Kitchen ONDANSETRON HCL IJ Inject as directed.    Marland Kitchen oxyCODONE-acetaminophen (PERCOCET/ROXICET) 5-325 MG per tablet Take 1 tablet by mouth every 8 (eight) hours as needed for severe pain. 9 tablet 0  . sertraline (ZOLOFT) 100 MG tablet Take 100 mg by mouth daily.    . traZODone (DESYREL) 100 MG tablet Take 100 mg by mouth at bedtime.    . vitamin B-12 (CYANOCOBALAMIN) 1000 MCG tablet Take 1,000 mcg by mouth daily.     No Known Allergies  Exam:  BP 152/91 mmHg  Pulse 90  Temp(Src) 98.2 F (36.8 C) (Oral)  Resp 16  SpO2 100%  LMP 03/17/2015 Gen: Well NAD HEENT: EOMI,  MMM Lungs: Normal work of breathing. CTABL Heart: RRR no MRG Abd: NABS, Soft. Nondistended, Nontender Exts: Brisk capillary refill, warm and well perfused.  Neck: Nontender to midline. Tender palpation right trapezius. Normal neck range of motion and negative Spurling's test. Upper extremity strength is intact throughout. Reflexes are equal and normal bilaterally. Normal sensation and motion.  Results for orders placed or performed during the hospital encounter of 03/31/15 (from the past 24 hour(s))  POCT urinalysis dip (device)     Status: None   Collection Time: 03/31/15  8:26 PM  Result Value Ref Range   Glucose, UA NEGATIVE  NEGATIVE mg/dL   Bilirubin Urine NEGATIVE NEGATIVE   Ketones, ur NEGATIVE NEGATIVE mg/dL   Specific Gravity, Urine >=1.030 1.005 - 1.030   Hgb urine dipstick NEGATIVE NEGATIVE   pH 5.5 5.0 - 8.0   Protein, ur NEGATIVE NEGATIVE mg/dL   Urobilinogen, UA 0.2 0.0 - 1.0 mg/dL   Nitrite NEGATIVE NEGATIVE   Leukocytes, UA NEGATIVE NEGATIVE  Pregnancy, urine POC     Status: None   Collection Time: 03/31/15  8:29 PM  Result Value Ref Range   Preg Test, Ur NEGATIVE NEGATIVE   No results found.  Assessment and Plan: 39 y.o. female with  1) vaginal discharge. Discussed options. Patient declines vaginal exam. Treat empirically with metronidazole for presumptive BV. Follow-up with PCP if not better. 2) trapezius pain and spasm. Myofascial strain. Treat with Robaxin Aleve and tiny amount of oxycodone for pain control.  Discussed warning signs or symptoms. Please see discharge instructions. Patient expresses understanding.     Rodolph Bong, MD 03/31/15 2056

## 2015-04-11 ENCOUNTER — Ambulatory Visit: Payer: Self-pay | Admitting: Family Medicine

## 2015-04-11 ENCOUNTER — Ambulatory Visit (INDEPENDENT_AMBULATORY_CARE_PROVIDER_SITE_OTHER): Payer: 59 | Admitting: Family Medicine

## 2015-04-11 ENCOUNTER — Encounter: Payer: Self-pay | Admitting: Family Medicine

## 2015-04-11 VITALS — BP 100/80 | HR 87 | Temp 98.2°F | Ht 65.0 in | Wt 289.2 lb

## 2015-04-11 DIAGNOSIS — F39 Unspecified mood [affective] disorder: Secondary | ICD-10-CM | POA: Diagnosis not present

## 2015-04-11 DIAGNOSIS — Z7689 Persons encountering health services in other specified circumstances: Secondary | ICD-10-CM

## 2015-04-11 DIAGNOSIS — K219 Gastro-esophageal reflux disease without esophagitis: Secondary | ICD-10-CM | POA: Insufficient documentation

## 2015-04-11 DIAGNOSIS — E669 Obesity, unspecified: Secondary | ICD-10-CM

## 2015-04-11 DIAGNOSIS — M542 Cervicalgia: Secondary | ICD-10-CM

## 2015-04-11 DIAGNOSIS — Z7189 Other specified counseling: Secondary | ICD-10-CM

## 2015-04-11 DIAGNOSIS — A499 Bacterial infection, unspecified: Secondary | ICD-10-CM

## 2015-04-11 DIAGNOSIS — N760 Acute vaginitis: Secondary | ICD-10-CM

## 2015-04-11 DIAGNOSIS — G43009 Migraine without aura, not intractable, without status migrainosus: Secondary | ICD-10-CM

## 2015-04-11 DIAGNOSIS — G8929 Other chronic pain: Secondary | ICD-10-CM

## 2015-04-11 DIAGNOSIS — B9689 Other specified bacterial agents as the cause of diseases classified elsewhere: Secondary | ICD-10-CM

## 2015-04-11 DIAGNOSIS — I10 Essential (primary) hypertension: Secondary | ICD-10-CM

## 2015-04-11 MED ORDER — ELETRIPTAN HYDROBROMIDE 40 MG PO TABS: 40.0000 mg | ORAL_TABLET | ORAL | Status: DC | PRN

## 2015-04-11 MED ORDER — ALPRAZOLAM 1 MG PO TABS: ORAL_TABLET | ORAL | Status: DC

## 2015-04-11 NOTE — Progress Notes (Signed)
Pre visit review using our clinic review tool, if applicable. No additional management support is needed unless otherwise documented below in the visit note. 

## 2015-04-11 NOTE — Patient Instructions (Addendum)
BEFORE YOU LEAVE: -schedule follow up in 3 months, come fasting and we will plan to check labs  We will try to get a hold of your neurologist to see if twice weekly vaginal metronidazole would be ok and will try a 4-6 months  Vit D3 1000 IU daily  Vit B12 1000 mcg daily subligual  See the psychiatrist for the anxiety  Follow up with orthopedics about the neck pain  We recommend the following healthy lifestyle measures: - eat a healthy diet consisting of lots of vegetables, fruits, beans, nuts, seeds, healthy meats such as white chicken and fish and whole grains.  - avoid fried foods, fast food, processed foods, sodas, red meet and other fattening foods.  - get a least 150 minutes of aerobic exercise per week.

## 2015-04-11 NOTE — Progress Notes (Addendum)
HPI:  Toni [name] is here to establish care. She was a No Show for her initial visit after insisting we work her in for a specific date and time. Her other PCP left practice. Last PCP and physical: had pap normal in feb  Has the following chronic problems that require follow up and concerns today:  Hx of migraines, seizure: -saw neurologist but did not like her, ? seizur ein 2015, none since -about 1 migraine per month -not on any seizure medications -meds: relpax, zofran -MRI and EEG with neuro in 2015 ok  Bacterial vaginosis: -reports recurrent for many years -has 4-6 episodes per year -treated with metronidazole 1 week ago for this -reports can't keep taking oral metro again due to concern for seizure trigger she reports her neurologist told her; but thinks can do vaginal flagyl -she does not want to follow up with her neurologist as did not like her -clear discharge today and started period today  Seasonal allergies: -takes zyrtec and flonase daily for this -doing well on this  Depression/Anxiety/Insomnia: -started about 10 years ago after second child -zoloft stopped after seizure as she thought may have contributed, xanax prn now 1-2 times per day for stress -no depressive symptoms recently, no severe anxiety or panic -no hx of hospitalization or SI -takes trazadone 1-2 times per month for sleep  HTN: -for many years -on hctz 25 mg daily -denies: CP, SOb, DOE  GERD: -since 2006 -hx of gastric ulcer with GI bleeding, saw GI in the past and did EGD  -as long as she takes the prilosec 20mg  daily she does not have any symptoms symptoms but occ take 40 mg about 2 times per week   Cervical neck strain: -has seen [facility] and [facility] (Dr. Ethelene Halamos) -has seen had MRI, PT, injections -she may try to get back in with Dr. Ethelene Halamos -doing neck exercises -takes percocet and flexeril as needed  Vit D and B12 def: -history of gastric  bypass -gets b12 injections q6 months and 500mcg daily -taking vit D3 1000 IU daily, was on 500 IU   ROS negative for unless reported above: fevers, unintentional weight loss, hearing or vision loss, chest pain, palpitations, struggling to breath, hemoptysis, melena, hematochezia, hematuria, falls, loc, si, thoughts of self harm  Past Medical History  Diagnosis Date  . Seizure     s/p eval with neuro, no medication, no events since  . Syncope     with ? seizure in 2015, eval with neruo  . Migraine   . GERD (gastroesophageal reflux disease)     hx gastric ulcer and upper GI bleed, ? NSAID induced  . Anxiety and depression     started with PPD  . Allergy   . Hypertension   . Bacterial vaginitis   . Vitamin D deficiency   . Vitamin B12 deficiency   . Chronic neck pain     sees [facility] for neck strain    Past Surgical History  Procedure Laterality Date  . Gastric bypass  2002  . Cesarean section  2006  . Tubuligation  2006  . Tonsillectomy and adenoidectomy    . Tubes in ears both Bilateral   . Eye surgery Bilateral     2001    Family History  Problem Relation Age of Onset  . Arthritis Mother   . Aneurysm Father     deceased  . Lung cancer Maternal Aunt   . Hyperlipidemia Mother   . Heart  disease Mother   . Diabetes Mother   . Asthma Mother     History   Social History  . Marital Status: Married    Spouse Name: N/A  . Number of Children: N/A  . Years of Education: N/A   Social History Main Topics  . Smoking status: Never Smoker   . Smokeless tobacco: Never Used  . Alcohol Use: No  . Drug Use: No  . Sexual Activity: Not on file   Other Topics Concern  . None   Social History Narrative     Current outpatient prescriptions:  .  ALPRAZolam (XANAX) 1 MG tablet, 1/2 -1 tablet oral once daily as needed for anxiety. Taper off slowly if you plan to stop. See psychiatrist if you feel you need to continue this medication., Disp: 30 tablet, Rfl:  0 .  BIOTIN PO, Take by mouth daily., Disp: , Rfl:  .  Calcium 250 MG CAPS, Take by mouth daily., Disp: , Rfl:  .  cetirizine (ZYRTEC) 10 MG tablet, Take 10 mg by mouth daily., Disp: , Rfl:  .  Cholecalciferol (VITAMIN D) 2000 UNITS CAPS, Take by mouth daily., Disp: , Rfl:  .  cyclobenzaprine (FLEXERIL) 10 MG tablet, Take 10 mg by mouth 3 (three) times daily as needed for muscle spasms., Disp: , Rfl:  .  eletriptan (RELPAX) 40 MG tablet, Take 1 tablet (40 mg total) by mouth as needed for migraine or headache. One tablet by mouth at onset of headache. May repeat in 2 hours if headache persists or recurs., Disp: 10 tablet, Rfl: 3 .  fluticasone (FLONASE) 50 MCG/ACT nasal spray, Place into both nostrils daily., Disp: , Rfl:  .  hydrochlorothiazide (HYDRODIURIL) 25 MG tablet, Take 25 mg by mouth daily., Disp: , Rfl:  .  metroNIDAZOLE (FLAGYL) 500 MG tablet, Take 1 tablet (500 mg total) by mouth 2 (two) times daily., Disp: 14 tablet, Rfl: 0 .  omeprazole (PRILOSEC) 20 MG capsule, Take 20 mg by mouth daily., Disp: , Rfl:  .  ondansetron (ZOFRAN) 8 MG tablet, Take by mouth every 8 (eight) hours as needed for nausea or vomiting., Disp: , Rfl:  .  ONDANSETRON HCL IJ, Inject as directed., Disp: , Rfl:  .  oxyCODONE-acetaminophen (PERCOCET/ROXICET) 5-325 MG per tablet, Take 1 tablet by mouth every 8 (eight) hours as needed for severe pain., Disp: 9 tablet, Rfl: 0 .  traZODone (DESYREL) 100 MG tablet, Take 100 mg by mouth at bedtime., Disp: , Rfl:  .  vitamin B-12 (CYANOCOBALAMIN) 1000 MCG tablet, Take 1,000 mcg by mouth daily., Disp: , Rfl:   EXAM:  Filed Vitals:   04/11/15 1623  BP: 100/80  Pulse: 87  Temp: 98.2 F (36.8 C)    Body mass index is 48.13 kg/(m^2).  GENERAL: vitals reviewed and listed above, alert, oriented, appears well hydrated and in no acute distress  HEENT: atraumatic, conjunttiva clear, no obvious abnormalities on inspection of external nose and ears  NECK: no obvious masses  on inspection  LUNGS: clear to auscultation bilaterally, no wheezes, rales or rhonchi, good air movement  CV: HRRR, no peripheral edema  MS: moves all extremities without noticeable abnormality  PSYCH: pleasant and cooperative, no obvious depression or anxiety  ASSESSMENT AND PLAN:  Discussed the following assessment and plan:  Migraine without aura and without status migrainosus, not intractable  Anxiety and depression  Obesity  Gastroesophageal reflux disease, esophagitis presence not specified  Chronic neck pain  Essential hypertension  Encounter to establish care  Bacterial vaginosis -We reviewed the PMH, PSH, FH, SH, Meds and Allergies. -We provided refills for any medications we will prescribe as needed. -We addressed current concerns per orders and patient instructions. -We have asked for records for pertinent exams, studies, vaccines and notes from previous providers. -We have advised patient to follow up per instructions below. -advised Vit D3 100 IU daily and subligual b12 daily and recheck these labs in 3 months -advised CBT for stress and information provided and advised against continued xanax for stress without sig anxiety or panic, discussed safe options to taper vs seeing psychiatry and numbers provided to contact psych if she feels worsening or the need to continue benzo, one refill provided as bridge or taper -advised my assistant to contact neuro office to see if PV metro gel bi-weekly would be ok with hx ? seizure -advised I do not rx opoids for chronic musculoskeletal pain, she plans to follow up with her treating orthopedic doctor regarding this -lifestyle recs for obsity, FASTING labs at follow up -lifestyle recs for GERD and continue lowest dose possible PPI  > 30 minutes spent with this patient with > 50% of times spent face to face in counseling.  -Patient advised to return or notify a doctor immediately if symptoms worsen or persist or new  concerns arise.  Patient Instructions  BEFORE YOU LEAVE: -schedule follow up in 3 months, come fasting and we will plan to check labs  We will try to get a hold of your neurologist to see if twice weekly vaginal metronidazole would be ok and will try a 4-6 months  Vit D3 1000 IU daily  Vit B12 1000 mcg daily subligual  See the psychiatrist for the anxiety  Follow up with orthopedics about the neck pain  We recommend the following healthy lifestyle measures: - eat a healthy diet consisting of lots of vegetables, fruits, beans, nuts, seeds, healthy meats such as white chicken and fish and whole grains.  - avoid fried foods, fast food, processed foods, sodas, red meet and other fattening foods.  - get a least 150 minutes of aerobic exercise per week.         Kriste Basque R.

## 2015-04-12 ENCOUNTER — Telehealth: Payer: Self-pay | Admitting: *Deleted

## 2015-04-12 ENCOUNTER — Telehealth: Payer: Self-pay | Admitting: Neurology

## 2015-04-12 ENCOUNTER — Telehealth: Payer: Self-pay | Admitting: Physician Assistant

## 2015-04-12 NOTE — Telephone Encounter (Signed)
I called the pt and informed her of the message below and per Dr Selena BattenKim that the neurologist office could not give any advice since she did not follow up with them. Per Dr Selena BattenKim she is not sure if using the medication vaginally would be any different than the pill form which would cause side effects and in this case she should see a gynecologist as this is their specialty and she was given the gynecologists numbers from our pamphlet.  Patient stated she does not understand why Dr Selena BattenKim keeps referring her out for every problem she has, just like she is referring her to a gastroenterology doctor for a medication she has been taking for years and knows that this works for her and she asked if there were any other doctors here that do not refer to another doctor for problems.  Patient stated she recalls Dr Selena BattenKim telling her BV is not harmless and she has taken this medication for years and does not understand.  I advised the pt Dr Selena BattenKim is trying to help her by referring her to someone else as this is their specialty to help her and other doctors here would do the same.  I advised her if she is having recurrent problems with BV this is all the more a reason she should see a specialist.  She asked if she had a yeast infection would Dr Selena BattenKim treat that and I advised her she does treat yeast infections and she would need to be seen first.  I offered to have Dr Selena BattenKim call the pt tomorrow when she comes in to the office tomorrow and she stated this is not necessary. Message forwarded to Dr Selena BattenKim for review.

## 2015-04-12 NOTE — Telephone Encounter (Signed)
Per our conversation.

## 2015-04-12 NOTE — Telephone Encounter (Signed)
Randa EvensJoanne with  Dr. Selena BattenKim at [facility] called requesting to speak with someone regarding some questions about the patient. Dr. Selena BattenKim would like to know if the patient can take Rx. METRONIZAOLE vaginally, because she read that it lowers the seizure threshold. Please call and advise.

## 2015-04-12 NOTE — Telephone Encounter (Signed)
Spoke with Selena BattenKim - told her this patient was lost to follow up but her records showed one reported seizure, normal EEG and normal MRI.  It will be up to Dr. Selena BattenKim if the benefits of the medication outweigh the risks.  Also, ran this by Dr. Epimenio FootSater and he was in agreement that it will be up to the PCP to prescribe this medication or choose another antibiotic with lower risks.

## 2015-04-12 NOTE — Telephone Encounter (Signed)
It is fine with me if her PCP is ok with the transfer

## 2015-04-12 NOTE — Telephone Encounter (Signed)
Pt called to request transition of care to [facility] to Malva Coganody Martin, PA-C from Dr. Kriste BasqueHannah Kim. I advised pt that both providers have to agree to transition care. Pt understands. [place] Sp[place] Please advise.

## 2015-04-12 NOTE — Telephone Encounter (Signed)
I called [facility] and left a message with Duwayne HeckDanielle for Dr Glenna DurandYin as Dr Selena BattenKim wanted to know if the pt can use Metronidazole vaginally as this lowers the seizure threshold since the pt has a history of seizures.  Duwayne HeckDanielle stated she will forward this to the doctors assistant.

## 2015-04-12 NOTE — Telephone Encounter (Signed)
Marcelino DusterMichelle, Dr Melvyn NovasYin's assistant called back to let Dr Selena BattenKim know he is out of the office today and she consulted with Dr Epimenio FootSater and he stated they have only seen this patient once and she reported to them she had a seizure that was witnessed by a family member at her home, EEG was normal and they did not prescribe any medications for her and she cancelled all follow up visits so he can only give generic information as in her case if Dr Selena BattenKim feels the benefits outweigh the risks, it is up to her to prescribe Flagyl or not.

## 2015-04-13 NOTE — Telephone Encounter (Signed)
Phone message mis-communicated to pt. Called pt. LM for her to call back. Advise per our discussion at her appt doubt much risk with the PV metrogel biweekly and can try if she wishes until gets in with new pcp. Can see gyn if persists or concerns regarding this. Contacted her neurologist as a courtesy given her expressed concern at her visit.  We also did not refer her to GI for anything, so I am not sure why she thought this.  [phone]

## 2015-04-13 NOTE — Telephone Encounter (Signed)
Left msg to notify pt that providers have agreed and pt can [facility] care to [facility], PA-C. Advised her to call in and schedule new pt appt.

## 2015-04-13 NOTE — Telephone Encounter (Signed)
Ok with me 

## 2015-04-21 ENCOUNTER — Telehealth: Payer: Self-pay | Admitting: Behavioral Health

## 2015-04-21 ENCOUNTER — Encounter: Payer: Self-pay | Admitting: Behavioral Health

## 2015-04-21 NOTE — Telephone Encounter (Signed)
Pre-Visit Call completed with patient and chart updated.   Pre-Visit Info documented in Specialty Comments under [employer].    

## 2015-04-21 NOTE — Telephone Encounter (Signed)
Unable to reach patient at time of Pre-Visit Call.  Left message for patient to return call when available.    

## 2015-04-21 NOTE — Addendum Note (Signed)
Addended by: Melanee SpryBYRD, Gaylon Bentz E on: 04/21/2015 03:48 PM   Modules accepted: Orders, Medications

## 2015-04-24 ENCOUNTER — Ambulatory Visit (INDEPENDENT_AMBULATORY_CARE_PROVIDER_SITE_OTHER): Payer: 59 | Admitting: Physician Assistant

## 2015-04-24 ENCOUNTER — Other Ambulatory Visit (HOSPITAL_COMMUNITY)
Admission: RE | Admit: 2015-04-24 | Discharge: 2015-04-24 | Disposition: A | Discharge status: Home / Self Care | Payer: 59 | Source: Ambulatory Visit | Attending: Family Medicine | Admitting: Family Medicine

## 2015-04-24 ENCOUNTER — Encounter: Payer: Self-pay | Admitting: Physician Assistant

## 2015-04-24 VITALS — BP 121/76 | HR 80 | Temp 98.4°F | Ht 65.0 in | Wt 284.6 lb

## 2015-04-24 DIAGNOSIS — Z113 Encounter for screening for infections with a predominantly sexual mode of transmission: Secondary | ICD-10-CM | POA: Insufficient documentation

## 2015-04-24 DIAGNOSIS — N760 Acute vaginitis: Secondary | ICD-10-CM

## 2015-04-24 DIAGNOSIS — N898 Other specified noninflammatory disorders of vagina: Secondary | ICD-10-CM | POA: Diagnosis not present

## 2015-04-24 DIAGNOSIS — K219 Gastro-esophageal reflux disease without esophagitis: Secondary | ICD-10-CM

## 2015-04-24 DIAGNOSIS — A499 Bacterial infection, unspecified: Secondary | ICD-10-CM

## 2015-04-24 DIAGNOSIS — Z8639 Personal history of other endocrine, nutritional and metabolic disease: Secondary | ICD-10-CM

## 2015-04-24 DIAGNOSIS — E559 Vitamin D deficiency, unspecified: Secondary | ICD-10-CM | POA: Diagnosis not present

## 2015-04-24 DIAGNOSIS — F39 Unspecified mood [affective] disorder: Secondary | ICD-10-CM

## 2015-04-24 DIAGNOSIS — B9689 Other specified bacterial agents as the cause of diseases classified elsewhere: Secondary | ICD-10-CM

## 2015-04-24 DIAGNOSIS — G8929 Other chronic pain: Secondary | ICD-10-CM

## 2015-04-24 DIAGNOSIS — M542 Cervicalgia: Secondary | ICD-10-CM

## 2015-04-24 LAB — CBC
HCT: 39.2 % (ref 36.0–46.0)
HEMOGLOBIN: 12.8 g/dL (ref 12.0–15.0)
MCHC: 32.7 g/dL (ref 30.0–36.0)
MCV: 81.9 fl (ref 78.0–100.0)
PLATELETS: 305 10*3/uL (ref 150.0–400.0)
RBC: 4.79 Mil/uL (ref 3.87–5.11)
RDW: 15.7 % — ABNORMAL HIGH (ref 11.5–15.5)
WBC: 6.7 10*3/uL (ref 4.0–10.5)

## 2015-04-24 LAB — VITAMIN B12: VITAMIN B 12: 445 pg/mL (ref 211–911)

## 2015-04-24 MED ORDER — OXYCODONE-ACETAMINOPHEN 10-325 MG PO TABS: 1.0000 | ORAL_TABLET | Freq: Three times a day (TID) | ORAL | Status: DC | PRN

## 2015-04-24 MED ORDER — FLUCONAZOLE 150 MG PO TABS: 150.0000 mg | ORAL_TABLET | Freq: Once | ORAL | Status: DC

## 2015-04-24 MED ORDER — CLINDAMYCIN PHOSPHATE 2 % VA CREA: 1.0000 | TOPICAL_CREAM | Freq: Every day | VAGINAL | Status: DC

## 2015-04-24 NOTE — Progress Notes (Signed)
Pre visit review using our clinic review tool, if applicable. No additional management support is needed unless otherwise documented below in the visit note. 

## 2015-04-24 NOTE — Progress Notes (Signed)
Patient presents to clinic today to transfer care from previous PCP Dr. Selena Batten with [facility].  Patient with history of recurrent bacterial vaginosis, previously well successfully treated with Flagyl. Patient endorses clear odorous discharge 2 days. Denies recent sexual activity. Denies vaginal pain or. Test. Denies urinary urgency, frequency or hematuria.  Patient with history of GERD, well controlled with Prilosec daily. Denies breakthrough symptoms.  Patient also suffers from chronic neck pain, combination of muscle tension/spasm and degenerative disc disease. Has been set up with orthopedics by previous PCP. Endorses symptoms controlled with Percocet and Flexeril daily.  Patient also with history of anxiety and depression. Endorses symptoms currently well controlled with combination of trazodone and Xanax. Takes Xanax as needed for panic attack. Patient is out of medication and needs refill.  Patient also with history of hypertension, taking hydrochlorothiazide 25 mg daily. Patient denies chest pain, palpitations, lightheadedness, dizziness, vision changes or frequent headaches.   BP Readings from Last 3 Encounters:  04/24/15 121/76  04/11/15 100/80  03/31/15 152/91   Past Medical History  Diagnosis Date  . Seizure     s/p eval with neuro, no medication, no events since  . Syncope     with ? seizure in 2015, eval with neruo  . Migraine   . GERD (gastroesophageal reflux disease)     hx gastric ulcer and upper GI bleed, ? NSAID induced  . Anxiety and depression     started with PPD  . Allergy   . Hypertension   . Bacterial vaginitis   . Vitamin D deficiency   . Vitamin B12 deficiency   . Chronic neck pain     sees [facility] for neck strain    Current Outpatient Prescriptions on File Prior to Visit  Medication Sig Dispense Refill  . ALPRAZolam (XANAX) 1 MG tablet 1/2 -1 tablet oral once daily as needed for anxiety. Taper off slowly if you plan to stop.  See psychiatrist if you feel you need to continue this medication. 30 tablet 0  . BIOTIN PO Take by mouth daily.    . Calcium 250 MG CAPS Take by mouth daily.    . cetirizine (ZYRTEC) 10 MG tablet Take 10 mg by mouth daily.    . Cholecalciferol (VITAMIN D) 2000 UNITS CAPS Take by mouth daily.    . cyclobenzaprine (FLEXERIL) 10 MG tablet Take 10 mg by mouth 3 (three) times daily as needed for muscle spasms.    Marland Kitchen eletriptan (RELPAX) 40 MG tablet Take 1 tablet (40 mg total) by mouth as needed for migraine or headache. One tablet by mouth at onset of headache. May repeat in 2 hours if headache persists or recurs. 10 tablet 3  . fluticasone (FLONASE) 50 MCG/ACT nasal spray Place into both nostrils daily.    . hydrochlorothiazide (HYDRODIURIL) 25 MG tablet Take 25 mg by mouth daily.    Marland Kitchen omeprazole (PRILOSEC) 20 MG capsule Take 20 mg by mouth daily.    . ondansetron (ZOFRAN) 8 MG tablet Take by mouth every 8 (eight) hours as needed for nausea or vomiting.    Marland Kitchen ONDANSETRON HCL IJ Inject as directed.    . traZODone (DESYREL) 100 MG tablet Take 100 mg by mouth at bedtime.    . vitamin B-12 (CYANOCOBALAMIN) 1000 MCG tablet Take 1,000 mcg by mouth daily.     No current facility-administered medications on file prior to visit.    Allergies  Allergen Reactions  . Ibuprofen     Causes ulcers  Family History  Problem Relation Age of Onset  . Arthritis Mother   . Aneurysm Father     deceased  . Lung cancer Maternal Aunt   . Hyperlipidemia Mother   . Heart disease Mother   . Diabetes Mother   . Asthma Mother     History   Social History  . Marital Status: Married    Spouse Name: N/A  . Number of Children: N/A  . Years of Education: N/A   Social History Main Topics  . Smoking status: Never Smoker   . Smokeless tobacco: Never Used  . Alcohol Use: No  . Drug Use: No  . Sexual Activity: Not on file   Other Topics Concern  . None   Social History Narrative    Review of Systems -  See HPI.  All other ROS are negative.  BP 121/76 mmHg  Pulse 80  Temp(Src) 98.4 F (36.9 C) (Oral)  Ht 5\' 5"  (1.651 m)  Wt 284 lb 9.6 oz (129.094 kg)  BMI 47.36 kg/m2  SpO2 100%  LMP 04/11/2015  Physical Exam  Constitutional: She is oriented to person, place, and time and well-developed, well-nourished, and in no distress.  HENT:  Head: Normocephalic and atraumatic.  Eyes: Conjunctivae are normal.  Neck: Neck supple. No thyromegaly present.  Cardiovascular: Normal rate, regular rhythm, normal heart sounds and intact distal pulses.   Pulmonary/Chest: Effort normal and breath sounds normal. No respiratory distress. She has no wheezes. She has no rales. She exhibits no tenderness.  Neurological: She is alert and oriented to person, place, and time.  Skin: Skin is warm and dry. No rash noted.  Psychiatric: Affect normal.  Vitals reviewed.   Recent Results (from the past 2160 hour(s))  POCT urinalysis dip (device)     Status: None   Collection Time: 03/31/15  8:26 PM  Result Value Ref Range   Glucose, UA NEGATIVE NEGATIVE mg/dL   Bilirubin Urine NEGATIVE NEGATIVE   Ketones, ur NEGATIVE NEGATIVE mg/dL   Specific Gravity, Urine >=1.030 1.005 - 1.030   Hgb urine dipstick NEGATIVE NEGATIVE   pH 5.5 5.0 - 8.0   Protein, ur NEGATIVE NEGATIVE mg/dL   Urobilinogen, UA 0.2 0.0 - 1.0 mg/dL   Nitrite NEGATIVE NEGATIVE   Leukocytes, UA NEGATIVE NEGATIVE    Comment: Biochemical Testing Only. Please order routine urinalysis from main lab if confirmatory testing is needed.  Pregnancy, urine POC     Status: None   Collection Time: 03/31/15  8:29 PM  Result Value Ref Range   Preg Test, Ur NEGATIVE NEGATIVE    Comment:        THE SENSITIVITY OF THIS METHODOLOGY IS >24 mIU/mL   Urine cytology ancillary only     Status: None   Collection Time: 04/24/15 12:00 AM  Result Value Ref Range   Chlamydia Negative     Comment: Normal Reference Range - Negative   Neisseria gonorrhea Negative      Comment: Normal Reference Range - Negative   Trichomonas Negative     Comment: Normal Reference Range - Negative  Urine cytology ancillary only     Status: None   Collection Time: 04/24/15 12:00 AM  Result Value Ref Range   Bacterial vaginitis Negative for Bacterial Vaginitis Microorganisms     Comment: Normal Reference Range - Negative   Candida vaginitis Negative for Candida Vaginitis Microorganisms     Comment: Normal Reference Range - Negative  CBC     Status: Abnormal   Collection  Time: 04/24/15  8:12 AM  Result Value Ref Range   WBC 6.7 4.0 - 10.5 K/uL   RBC 4.79 3.87 - 5.11 Mil/uL   Platelets 305.0 150.0 - 400.0 K/uL   Hemoglobin 12.8 12.0 - 15.0 g/dL   HCT 16.1 09.6 - 04.5 %   MCV 81.9 78.0 - 100.0 fl   MCHC 32.7 30.0 - 36.0 g/dL   RDW 40.9 (H) 81.1 - 91.4 %  B12     Status: None   Collection Time: 04/24/15  8:12 AM  Result Value Ref Range   Vitamin B-12 445 211 - 911 pg/mL  Vitamin D 1,25 dihydroxy     Status: None   Collection Time: 04/24/15  8:12 AM  Result Value Ref Range   Vitamin D 1, 25 (OH)2 Total 60 18 - 72 pg/mL   Vitamin D3 1, 25 (OH)2 60 pg/mL   Vitamin D2 1, 25 (OH)2 <8 pg/mL    Comment: Vitamin D3, 1,25(OH)2 indicates both endogenous production and supplementation.  Vitamin D2, 1,25(OH)2 is an indicator of exogeous sources, such as diet or supplementation.  Interpretation and therapy are based on measurement of Vitamin D,1,25(OH)2, Total. This test was developed and its analytical performance characteristics have been determined by [facility], [place], [place]. It has not been cleared or approved by the FDA. This assay has been validated pursuant to the CLIA regulations and is used for clinical purposes.     Assessment/Plan: Bacterial vaginosis Positive history. Patient endorses symptoms identical to previous infections. Patient endorses history of seizure last year with negative MRI and neurology clearance. To be careful  we'll stick with topical preparations. Will avoid Flagyl and Rx Cleocin gel. We'll also Rx Diflucan as patient is prone to yeast infections with anabiotic use. Patient encouraged to start a daily probiotic. Topical boric acid and vinegar wipes discussed with patient. Follow-up if symptoms are not improving.  Esophageal reflux Patient endorses well controlled. Continue current regimen.  Anxiety and depression Well-controlled on current regimen. Continue trazodone. Will refill Xanax. Controlled substance contract signed. Will obtain UDS at follow-up.  Vitamin D deficiency Noted in chart. Will obtain repeat vitamin D. We'll also check CBC and B12 due to history of gastric bypass. We'll treat based on findings.  Chronic neck pain Patient encouraged to attend new patient appointment with Dr. Mikle Bosworth as scheduled. Continue current medication regimen.

## 2015-04-24 NOTE — Patient Instructions (Signed)
Please go to the lab for blood work and urine testing. I will call you with your results.  Please continue medications as directed with exception of Trazodone -- take 1/2 tablet at bedtime for mood and sleep. Limit Xanax use.  Use Cleogel for bacterial vaginosis. Use diflucan as directed if needed for yeast infection from antibiotic use. Stay well hydrated and continue probiotic. Consider trying the vinegar treatment we discussed.  Follow-up with me in 4 weeks to reassess mood/stress levels.

## 2015-04-25 LAB — URINE CYTOLOGY ANCILLARY ONLY
Bacterial vaginitis: NEGATIVE
CANDIDA VAGINITIS: NEGATIVE
Chlamydia: NEGATIVE
NEISSERIA GONORRHEA: NEGATIVE
TRICH (WINDOWPATH): NEGATIVE

## 2015-04-26 ENCOUNTER — Telehealth: Payer: Self-pay | Admitting: Physician Assistant

## 2015-04-26 NOTE — Telephone Encounter (Signed)
Relation to pt: self  Call back number: [phone]   Reason for call:  Patient returning your call regarding lab results

## 2015-04-27 LAB — VITAMIN D 1,25 DIHYDROXY
VITAMIN D3 1, 25 (OH): 60 pg/mL
Vitamin D 1, 25 (OH)2 Total: 60 pg/mL (ref 18–72)

## 2015-04-27 NOTE — Telephone Encounter (Signed)
Called and spoke with the pt and informed her of the note below.  Pt verbalized understanding.  Pt stated that she has not seen the Hematologist since her initial visit.  She does have severe bleeding during her menses,and she was given a injection after having a surgical procedure.  Pt stated that she will try to get her records.//AB/CMA

## 2015-04-27 NOTE — Telephone Encounter (Signed)
-----   Message from Waldon Merl, PA-C sent at 04/26/2015  4:31 PM EDT ----- Did she see a Hematologist who diagnosed her with this? Has she ever been treated or had severe bleeding during menses or after surgical procedures? Would like records of her diagnosis so I can review things.  RDW not associated with Vonwillebrand usually. Usually due to issues with iron but in her case since she was not anemic, nothing to worry about presently.

## 2015-04-27 NOTE — Telephone Encounter (Signed)
Patient returning your call regarding lab results. °

## 2015-04-30 DIAGNOSIS — E559 Vitamin D deficiency, unspecified: Secondary | ICD-10-CM | POA: Insufficient documentation

## 2015-04-30 NOTE — Assessment & Plan Note (Signed)
Noted in chart. Will obtain repeat vitamin D. We'll also check CBC and B12 due to history of gastric bypass. We'll treat based on findings.

## 2015-04-30 NOTE — Assessment & Plan Note (Signed)
Positive history. Patient endorses symptoms identical to previous infections. Patient endorses history of seizure last year with negative MRI and neurology clearance. To be careful we'll stick with topical preparations. Will avoid Flagyl and Rx Cleocin gel. We'll also Rx Diflucan as patient is prone to yeast infections with anabiotic use. Patient encouraged to start a daily probiotic. Topical boric acid and vinegar wipes discussed with patient. Follow-up if symptoms are not improving.

## 2015-04-30 NOTE — Assessment & Plan Note (Signed)
Patient endorses well controlled. Continue current regimen.

## 2015-04-30 NOTE — Assessment & Plan Note (Signed)
Well-controlled on current regimen. Continue trazodone. Will refill Xanax. Controlled substance contract signed. Will obtain UDS at follow-up.

## 2015-04-30 NOTE — Assessment & Plan Note (Signed)
Patient encouraged to attend new patient appointment with Dr. Mikle Bosworth as scheduled. Continue current medication regimen.

## 2015-05-10 ENCOUNTER — Telehealth: Payer: Self-pay | Admitting: Family Medicine

## 2015-05-12 MED ORDER — ALPRAZOLAM 1 MG PO TABS: ORAL_TABLET | ORAL | Status: DC

## 2015-05-12 NOTE — Telephone Encounter (Signed)
Rx printed and forwarded to [facility] to sign. CSC and UDS will need to be completed when pt picks up Rx. Pt has been notified and voices understanding.  FW: Medication Renewal Request     Sandford Craze, NP    Sent: Wed May 10, 2015 3:56 PM    To: Kathi Simpers, CMA    Phone [phone]    Teddie Curd    MRN: [id number] DOB: 1976-03-19    Pt Work: [phone] Pt Home: [phone]    Entered: [phone]      Message     OK to give 30 tabs zero refills. Did she complete UDS? If not needs to complete please.

## 2015-05-12 NOTE — Addendum Note (Signed)
Addended by: Mervin Kung A on: 05/12/2015 04:56 PM   Modules accepted: Orders

## 2015-05-15 NOTE — Telephone Encounter (Signed)
Pt signed CSC and completed UDS.  Rx given to pt.

## 2015-05-23 ENCOUNTER — Telehealth: Payer: Self-pay | Admitting: Physician Assistant

## 2015-05-23 NOTE — Telephone Encounter (Signed)
UDS obtained when I was out of office when Melissa refilled medications. UDS results came back positive for medications that she is prescribed. It did also test positive for diazepam and its metabolites -- found in medications like Valium. She is not to be taking medications that are not prescribed to her. We will rescreen before any further refills will be given. If she tests positive for non-prescribed medications again, she will unfortunately no longer be able to receive controlled medications from this practice.

## 2015-05-24 NOTE — Telephone Encounter (Signed)
Called and spoke with the pt on (05/23/15) and informed her of the note below.  Pt verbalized understanding.  Pt asked if the med that show up on the UDS report was Xanax.  Informed the pt that it was not Xanax but it was Valium.  Pt stated that her mother gave her a nerve pill and she took it prior.   Informed the pt again that she should not be taking anyone else medication.   She stated that she has seen Dr. Ethelene Hal and placed her on a pain med Percocet twice daily, and she wanted to make should it will not be a problem.//AB/CMA

## 2015-05-24 NOTE — Telephone Encounter (Deleted)
Called and spoke with the pt on (8/16/

## 2015-06-02 ENCOUNTER — Encounter: Payer: Self-pay | Admitting: Physician Assistant

## 2015-06-02 ENCOUNTER — Ambulatory Visit (INDEPENDENT_AMBULATORY_CARE_PROVIDER_SITE_OTHER): Payer: 59 | Admitting: Physician Assistant

## 2015-06-02 VITALS — BP 126/90 | HR 82 | Temp 98.4°F | Resp 16 | Ht 65.0 in | Wt 284.0 lb

## 2015-06-02 DIAGNOSIS — F39 Unspecified mood [affective] disorder: Secondary | ICD-10-CM | POA: Diagnosis not present

## 2015-06-02 DIAGNOSIS — M542 Cervicalgia: Secondary | ICD-10-CM | POA: Diagnosis not present

## 2015-06-02 DIAGNOSIS — E559 Vitamin D deficiency, unspecified: Secondary | ICD-10-CM

## 2015-06-02 DIAGNOSIS — G8929 Other chronic pain: Secondary | ICD-10-CM | POA: Diagnosis not present

## 2015-06-02 DIAGNOSIS — E539 Vitamin B deficiency, unspecified: Secondary | ICD-10-CM | POA: Diagnosis not present

## 2015-06-02 MED ORDER — AMITRIPTYLINE HCL 10 MG PO TABS: 10.0000 mg | ORAL_TABLET | Freq: Every day | ORAL | Status: DC

## 2015-06-02 MED ORDER — PANTOPRAZOLE SODIUM 40 MG PO TBEC: 40.0000 mg | DELAYED_RELEASE_TABLET | Freq: Every day | ORAL | Status: DC

## 2015-06-02 MED ORDER — ALPRAZOLAM 2 MG PO TABS: 1.0000 mg | ORAL_TABLET | Freq: Three times a day (TID) | ORAL | Status: DC | PRN

## 2015-06-02 MED ORDER — CYANOCOBALAMIN 1000 MCG/ML IJ SOLN
1000.0000 ug | Freq: Once | INTRAMUSCULAR | Status: AC
  Administered 2015-06-02: 1000 ug via INTRAMUSCULAR

## 2015-06-02 NOTE — Patient Instructions (Signed)
Please start the Elavil at bedtime nightly. Continue Xanax as directed.  Start the Pantoprazole (Protonix) once you have ran out of the Prilosec.  Follow-up 6 weeks.

## 2015-06-02 NOTE — Progress Notes (Signed)
Pre visit review using our clinic review tool, if applicable. No additional management support is needed unless otherwise documented below in the visit note/SLS  

## 2015-06-05 DIAGNOSIS — E538 Deficiency of other specified B group vitamins: Secondary | ICD-10-CM | POA: Insufficient documentation

## 2015-06-05 NOTE — Progress Notes (Signed)
Patient presents to clinic today for 1 month follow-up of anxiety and depression, taking Xanax prn for acute anxiety. Is helping some but still with overall anxiety and depressed mood, affecting sleep. Is willing to consider long-term therapy at present time.  Is requesting b12 injection due to hx of b12 deficiency secondary to gastric surgery.  Past Medical History  Diagnosis Date  . Seizure     s/p eval with neuro, no medication, no events since  . Syncope     with ? seizure in 2015, eval with neruo  . Migraine   . GERD (gastroesophageal reflux disease)     hx gastric ulcer and upper GI bleed, ? NSAID induced  . Anxiety and depression     started with PPD  . Allergy   . Hypertension   . Bacterial vaginitis   . Vitamin D deficiency   . Vitamin B12 deficiency   . Chronic neck pain     sees [facility] for [facility] strain    Current Outpatient Prescriptions on File Prior to Visit  Medication Sig Dispense Refill  . BIOTIN PO Take by mouth daily.    . Calcium 250 MG CAPS Take by mouth daily.    . cetirizine (ZYRTEC) 10 MG tablet Take 10 mg by mouth daily.    . Cholecalciferol (VITAMIN D) 2000 UNITS CAPS Take by mouth daily.    Marland Kitchen eletriptan (RELPAX) 40 MG tablet Take 1 tablet (40 mg total) by mouth as needed for migraine or headache. One tablet by mouth at onset of headache. May repeat in 2 hours if headache persists or recurs. 10 tablet 3  . fluticasone (FLONASE) 50 MCG/ACT nasal spray Place into both nostrils daily.    . hydrochlorothiazide (HYDRODIURIL) 25 MG tablet Take 25 mg by mouth daily.    . ondansetron (ZOFRAN) 8 MG tablet Take by mouth every 8 (eight) hours as needed for nausea or vomiting.    Marland Kitchen ONDANSETRON HCL IJ Inject as directed.    Marland Kitchen oxyCODONE-acetaminophen (PERCOCET) 10-325 MG per tablet Take 1 tablet by mouth every 8 (eight) hours as needed for pain. Take 1 tablet by mouth every 8 hours as needed for severe pain. 15 tablet 0  . vitamin B-12  (CYANOCOBALAMIN) 1000 MCG tablet Take 1,000 mcg by mouth daily.     No current facility-administered medications on file prior to visit.    Allergies  Allergen Reactions  . Ibuprofen     Causes ulcers    Family History  Problem Relation Age of Onset  . Arthritis Mother   . Aneurysm Father     deceased  . Lung cancer Maternal Aunt   . Hyperlipidemia Mother   . Heart disease Mother   . Diabetes Mother   . Asthma Mother     Social History   Social History  . Marital Status: Married    Spouse Name: N/A  . Number of Children: N/A  . Years of Education: N/A   Social History Main Topics  . Smoking status: Never Smoker   . Smokeless tobacco: Never Used  . Alcohol Use: No  . Drug Use: No  . Sexual Activity: Not Asked   Other Topics Concern  . None   Social History Narrative    Review of Systems - See HPI.  All other ROS are negative.  BP 126/90 mmHg  Pulse 82  Temp(Src) 98.4 F (36.9 C) (Oral)  Resp 16  Ht 5\' 5"  (1.651 m)  Wt 284  lb (128.822 kg)  BMI 47.26 kg/m2  SpO2 97%  LMP 06/01/2015  Physical Exam  Constitutional: She is oriented to person, place, and time and well-developed, well-nourished, and in no distress.  HENT:  Head: Normocephalic and atraumatic.  Eyes: Conjunctivae are normal.  Cardiovascular: Normal rate, regular rhythm, normal heart sounds and intact distal pulses.   Pulmonary/Chest: Effort normal and breath sounds normal. No respiratory distress. She has no wheezes. She has no rales. She exhibits no tenderness.  Neurological: She is alert and oriented to person, place, and time.  Skin: Skin is warm and dry. No rash noted.  Psychiatric: Affect normal.  Vitals reviewed.   Recent Results (from the past 2160 hour(s))  POCT urinalysis dip (device)     Status: None   Collection Time: 03/31/15  8:26 PM  Result Value Ref Range   Glucose, UA NEGATIVE NEGATIVE mg/dL   Bilirubin Urine NEGATIVE NEGATIVE   Ketones, ur NEGATIVE NEGATIVE mg/dL    Specific Gravity, Urine >=1.030 1.005 - 1.030   Hgb urine dipstick NEGATIVE NEGATIVE   pH 5.5 5.0 - 8.0   Protein, ur NEGATIVE NEGATIVE mg/dL   Urobilinogen, UA 0.2 0.0 - 1.0 mg/dL   Nitrite NEGATIVE NEGATIVE   Leukocytes, UA NEGATIVE NEGATIVE    Comment: Biochemical Testing Only. Please order routine urinalysis from main lab if confirmatory testing is needed.  Pregnancy, urine POC     Status: None   Collection Time: 03/31/15  8:29 PM  Result Value Ref Range   Preg Test, Ur NEGATIVE NEGATIVE    Comment:        THE SENSITIVITY OF THIS METHODOLOGY IS >24 mIU/mL   Urine cytology ancillary only     Status: None   Collection Time: 04/24/15 12:00 AM  Result Value Ref Range   Chlamydia Negative     Comment: Normal Reference Range - Negative   Neisseria gonorrhea Negative     Comment: Normal Reference Range - Negative   Trichomonas Negative     Comment: Normal Reference Range - Negative  Urine cytology ancillary only     Status: None   Collection Time: 04/24/15 12:00 AM  Result Value Ref Range   Bacterial vaginitis Negative for Bacterial Vaginitis Microorganisms     Comment: Normal Reference Range - Negative   Candida vaginitis Negative for Candida Vaginitis Microorganisms     Comment: Normal Reference Range - Negative  CBC     Status: Abnormal   Collection Time: 04/24/15  8:12 AM  Result Value Ref Range   WBC 6.7 4.0 - 10.5 K/uL   RBC 4.79 3.87 - 5.11 Mil/uL   Platelets 305.0 150.0 - 400.0 K/uL   Hemoglobin 12.8 12.0 - 15.0 g/dL   HCT 16.1 09.6 - 04.5 %   MCV 81.9 78.0 - 100.0 fl   MCHC 32.7 30.0 - 36.0 g/dL   RDW 40.9 (H) 81.1 - 91.4 %  B12     Status: None   Collection Time: 04/24/15  8:12 AM  Result Value Ref Range   Vitamin B-12 445 211 - 911 pg/mL  Vitamin D 1,25 dihydroxy     Status: None   Collection Time: 04/24/15  8:12 AM  Result Value Ref Range   Vitamin D 1, 25 (OH)2 Total 60 18 - 72 pg/mL   Vitamin D3 1, 25 (OH)2 60 pg/mL   Vitamin D2 1, 25 (OH)2 <8 pg/mL     Comment: Vitamin D3, 1,25(OH)2 indicates both endogenous production and supplementation.  Vitamin D2, 1,25(OH)2  is an indicator of exogeous sources, such as diet or supplementation.  Interpretation and therapy are based on measurement of Vitamin D,1,25(OH)2, Total. This test was developed and its analytical performance characteristics have been determined by [facility], [place], [place]. It has not been cleared or approved by the FDA. This assay has been validated pursuant to the CLIA regulations and is used for clinical purposes.     Assessment/Plan: Anxiety and depression Will begin Elavil 10 mg nightly. Continue Xanax PRN with goal of weaning off completely over next 6 months. Will obtain repeat UDS at follow-up in 1 month as she had previously tested positive for another benzo at first visit when medications refilled. Return sooner if needed.  Vitamin B deficiency B12 injection given by nursing staff.

## 2015-06-05 NOTE — Assessment & Plan Note (Signed)
Will begin Elavil 10 mg nightly. Continue Xanax PRN with goal of weaning off completely over next 6 months. Will obtain repeat UDS at follow-up in 1 month as she had previously tested positive for another benzo at first visit when medications refilled. Return sooner if needed.

## 2015-06-05 NOTE — Assessment & Plan Note (Signed)
B12 injection given by nursing staff.  

## 2015-07-18 ENCOUNTER — Ambulatory Visit: Payer: 59 | Admitting: Physician Assistant

## 2015-07-19 ENCOUNTER — Ambulatory Visit: Payer: 59 | Admitting: Family Medicine

## 2015-07-21 ENCOUNTER — Encounter: Payer: Self-pay | Admitting: Physician Assistant

## 2015-07-21 ENCOUNTER — Other Ambulatory Visit (HOSPITAL_COMMUNITY)
Admission: RE | Admit: 2015-07-21 | Discharge: 2015-07-21 | Disposition: A | Discharge status: Home / Self Care | Payer: 59 | Source: Ambulatory Visit | Attending: Physician Assistant | Admitting: Physician Assistant

## 2015-07-21 ENCOUNTER — Ambulatory Visit (INDEPENDENT_AMBULATORY_CARE_PROVIDER_SITE_OTHER): Payer: 59 | Admitting: Physician Assistant

## 2015-07-21 VITALS — BP 136/87 | HR 74 | Temp 98.6°F | Resp 16 | Ht 65.0 in | Wt 285.1 lb

## 2015-07-21 DIAGNOSIS — F39 Unspecified mood [affective] disorder: Secondary | ICD-10-CM | POA: Diagnosis not present

## 2015-07-21 DIAGNOSIS — N898 Other specified noninflammatory disorders of vagina: Secondary | ICD-10-CM | POA: Diagnosis not present

## 2015-07-21 DIAGNOSIS — Z113 Encounter for screening for infections with a predominantly sexual mode of transmission: Secondary | ICD-10-CM | POA: Insufficient documentation

## 2015-07-21 DIAGNOSIS — K219 Gastro-esophageal reflux disease without esophagitis: Secondary | ICD-10-CM

## 2015-07-21 DIAGNOSIS — N760 Acute vaginitis: Secondary | ICD-10-CM | POA: Diagnosis present

## 2015-07-21 MED ORDER — ALPRAZOLAM 2 MG PO TABS: 1.0000 mg | ORAL_TABLET | Freq: Three times a day (TID) | ORAL | Status: DC | PRN

## 2015-07-21 MED ORDER — AMITRIPTYLINE HCL 10 MG PO TABS: 10.0000 mg | ORAL_TABLET | Freq: Every day | ORAL | Status: DC

## 2015-07-21 NOTE — Assessment & Plan Note (Signed)
Stable. Medications refilled. UDS obtained today.

## 2015-07-21 NOTE — Patient Instructions (Signed)
Please continue medications as directed. Increase fluids and rest. I will call with all results -- no sexual activity until results are in. Make sure to keep a check on your MyChart so I can contact you this weekend with results and instructions. Start a daily probiotic.  Follow-up in February for an annual exam.

## 2015-07-21 NOTE — Assessment & Plan Note (Signed)
Will continue Prilosec OTC daily. GERD diet discussed again. Follow-up PRN.

## 2015-07-21 NOTE — Progress Notes (Signed)
Patient presents to clinic today for 6-week follow-up of Depression/Anxiety and GERD.   Patient endorses doing better with current medication Amitriptyline. Sleeping better with medication. Denies chest pain or racing heart.  Is averaging using Xanax about three times per day.  Patient endorses switching her Rx Protonix to omeprazole 20 mg daily with better relief of symptoms. Denies breakthrough symptoms as long as she is taking her medications.  Patient endorses 1 week of vaginal discharge with odor. Denies vaginal pain or itching but notes some irritation. Denies urinary symptoms. Is currently sexually active. Patient endorses chlamydia scare last year. Denies fever or chills.   Past Medical History  Diagnosis Date  . Seizure (HCC)     s/p eval with neuro, no medication, no events since  . Syncope     with ? seizure in 2015, eval with neruo  . Migraine   . GERD (gastroesophageal reflux disease)     hx gastric ulcer and upper GI bleed, ? NSAID induced  . Anxiety and depression     started with PPD  . Allergy   . Hypertension   . Bacterial vaginitis   . Vitamin D deficiency   . Vitamin B12 deficiency   . Chronic neck pain     sees [facility] for neck strain    Current Outpatient Prescriptions on File Prior to Visit  Medication Sig Dispense Refill  . BIOTIN PO Take by mouth daily.    . Calcium 250 MG CAPS Take by mouth daily.    . cetirizine (ZYRTEC) 10 MG tablet Take 10 mg by mouth daily.    . Cholecalciferol (VITAMIN D) 2000 UNITS CAPS Take by mouth daily.    Marland Kitchen eletriptan (RELPAX) 40 MG tablet Take 1 tablet (40 mg total) by mouth as needed for migraine or headache. One tablet by mouth at onset of headache. May repeat in 2 hours if headache persists or recurs. 10 tablet 3  . fluticasone (FLONASE) 50 MCG/ACT nasal spray Place into both nostrils daily.    . hydrochlorothiazide (HYDRODIURIL) 25 MG tablet Take 25 mg by mouth daily.    . methocarbamol (ROBAXIN) 500 MG  tablet Take 500 mg by mouth every 8 (eight) hours as needed for muscle spasms.    . ondansetron (ZOFRAN) 8 MG tablet Take by mouth every 8 (eight) hours as needed for nausea or vomiting.    Marland Kitchen oxyCODONE-acetaminophen (PERCOCET) 10-325 MG per tablet Take 1 tablet by mouth every 8 (eight) hours as needed for pain. Take 1 tablet by mouth every 8 hours as needed for severe pain. 15 tablet 0  . pantoprazole (PROTONIX) 40 MG tablet Take 1 tablet (40 mg total) by mouth daily. 90 tablet 1  . vitamin B-12 (CYANOCOBALAMIN) 1000 MCG tablet Take 1,000 mcg by mouth daily.     No current facility-administered medications on file prior to visit.    Allergies  Allergen Reactions  . Ibuprofen     Causes ulcers    Family History  Problem Relation Age of Onset  . Arthritis Mother   . Aneurysm Father     deceased  . Lung cancer Maternal Aunt   . Hyperlipidemia Mother   . Heart disease Mother   . Diabetes Mother   . Asthma Mother     Social History   Social History  . Marital Status: Married    Spouse Name: N/A  . Number of Children: N/A  . Years of Education: N/A   Social History Main Topics  .  Smoking status: Never Smoker   . Smokeless tobacco: Never Used  . Alcohol Use: No  . Drug Use: No  . Sexual Activity: Not Asked   Other Topics Concern  . None   Social History Narrative    Review of Systems - See HPI.  All other ROS are negative.  BP 136/87 mmHg  Pulse 74  Temp(Src) 98.6 F (37 C) (Oral)  Resp 16  Ht 5\' 5"  (1.651 m)  Wt 285 lb 2 oz (129.332 kg)  BMI 47.45 kg/m2  SpO2 100%  LMP 06/28/2015  Physical Exam  Constitutional: She is oriented to person, place, and time and well-developed, well-nourished, and in no distress.  HENT:  Head: Normocephalic and atraumatic.  Eyes: Conjunctivae are normal.  Cardiovascular: Normal rate, regular rhythm, normal heart sounds and intact distal pulses.   Pulmonary/Chest: Effort normal and breath sounds normal. No respiratory distress.  She has no wheezes. She has no rales. She exhibits no tenderness.  Neurological: She is alert and oriented to person, place, and time.  Skin: Skin is warm and dry. No rash noted.  Psychiatric: Affect normal.  Vitals reviewed.   Recent Results (from the past 2160 hour(s))  Urine cytology ancillary only     Status: None   Collection Time: 04/24/15 12:00 AM  Result Value Ref Range   Chlamydia Negative     Comment: Normal Reference Range - Negative   Neisseria gonorrhea Negative     Comment: Normal Reference Range - Negative   Trichomonas Negative     Comment: Normal Reference Range - Negative  Urine cytology ancillary only     Status: None   Collection Time: 04/24/15 12:00 AM  Result Value Ref Range   Bacterial vaginitis Negative for Bacterial Vaginitis Microorganisms     Comment: Normal Reference Range - Negative   Candida vaginitis Negative for Candida Vaginitis Microorganisms     Comment: Normal Reference Range - Negative  CBC     Status: Abnormal   Collection Time: 04/24/15  8:12 AM  Result Value Ref Range   WBC 6.7 4.0 - 10.5 K/uL   RBC 4.79 3.87 - 5.11 Mil/uL   Platelets 305.0 150.0 - 400.0 K/uL   Hemoglobin 12.8 12.0 - 15.0 g/dL   HCT 16.139.2 09.636.0 - 04.546.0 %   MCV 81.9 78.0 - 100.0 fl   MCHC 32.7 30.0 - 36.0 g/dL   RDW 40.915.7 (H) 81.111.5 - 91.415.5 %  B12     Status: None   Collection Time: 04/24/15  8:12 AM  Result Value Ref Range   Vitamin B-12 445 211 - 911 pg/mL  Vitamin D 1,25 dihydroxy     Status: None   Collection Time: 04/24/15  8:12 AM  Result Value Ref Range   Vitamin D 1, 25 (OH)2 Total 60 18 - 72 pg/mL   Vitamin D3 1, 25 (OH)2 60 pg/mL   Vitamin D2 1, 25 (OH)2 <8 pg/mL    Comment: Vitamin D3, 1,25(OH)2 indicates both endogenous production and supplementation.  Vitamin D2, 1,25(OH)2 is an indicator of exogeous sources, such as diet or supplementation.  Interpretation and therapy are based on measurement of Vitamin D,1,25(OH)2, Total. This test was developed and its  analytical performance characteristics have been determined by [facility], [place], [place]. It has not been cleared or approved by the FDA. This assay has been validated pursuant to the CLIA regulations and is used for clinical purposes.     Assessment/Plan: Vaginal discharge Patient deferring vaginal examination. Will  obtain urine ancillary testing for vaginal infections -- gardnerella, yeast, gonorrhea and chlamydia. Will treat based on results.  Esophageal reflux Will continue Prilosec OTC daily. GERD diet discussed again. Follow-up PRN.  Anxiety and depression Stable. Medications refilled. UDS obtained today.

## 2015-07-21 NOTE — Assessment & Plan Note (Signed)
Patient deferring vaginal examination. Will obtain urine ancillary testing for vaginal infections -- gardnerella, yeast, gonorrhea and chlamydia. Will treat based on results.

## 2015-07-21 NOTE — Progress Notes (Signed)
Pre visit review using our clinic review tool, if applicable. No additional management support is needed unless otherwise documented below in the visit note/SLS  

## 2015-07-25 LAB — URINE CYTOLOGY ANCILLARY ONLY
CHLAMYDIA, DNA PROBE: NEGATIVE
NEISSERIA GONORRHEA: NEGATIVE
TRICH (WINDOWPATH): NEGATIVE

## 2015-07-26 ENCOUNTER — Encounter: Payer: Self-pay | Admitting: Physician Assistant

## 2015-07-27 LAB — CERVICOVAGINAL ANCILLARY ONLY
BACTERIAL VAGINITIS: POSITIVE — AB
CANDIDA VAGINITIS: NEGATIVE

## 2015-07-28 ENCOUNTER — Telehealth: Payer: Self-pay | Admitting: *Deleted

## 2015-07-28 ENCOUNTER — Encounter: Payer: Self-pay | Admitting: Physician Assistant

## 2015-07-28 MED ORDER — METRONIDAZOLE 500 MG PO TABS: 500.0000 mg | ORAL_TABLET | Freq: Two times a day (BID) | ORAL | Status: DC

## 2015-07-28 NOTE — Telephone Encounter (Signed)
-----   Message from Waldon MerlWilliam C Martin, PA-C sent at 07/28/2015  7:14 AM EDT ----- Positive for BV infection. Please send in Rx for Flagyl 500 mg BID x 7 days with 0 refills. Please call and inform the patient.

## 2015-07-28 NOTE — Telephone Encounter (Signed)
Patient informed, understood & agreed; Rx sent to requested pharmacy/SLS

## 2015-08-16 ENCOUNTER — Other Ambulatory Visit: Payer: Self-pay | Admitting: Physician Assistant

## 2015-08-18 ENCOUNTER — Encounter: Payer: Self-pay | Admitting: Physician Assistant

## 2015-08-18 MED ORDER — ALPRAZOLAM 2 MG PO TABS: 1.0000 mg | ORAL_TABLET | Freq: Three times a day (TID) | ORAL | Status: DC | PRN

## 2015-08-18 NOTE — Addendum Note (Signed)
Addended by: Regis BillSCATES, SHARON L on: 08/18/2015 04:29 PM   Modules accepted: Orders

## 2015-08-18 NOTE — Telephone Encounter (Signed)
Rx resent topharmacy, provider informed patient/SLS

## 2015-08-23 ENCOUNTER — Telehealth: Payer: Self-pay | Admitting: Physician Assistant

## 2015-08-23 MED ORDER — ALPRAZOLAM 2 MG PO TABS: 1.0000 mg | ORAL_TABLET | Freq: Three times a day (TID) | ORAL | Status: DC | PRN

## 2015-08-23 NOTE — Telephone Encounter (Signed)
°  Relation to ZO:XWRUpt:self Call back [phone] Pharmacy:[facility]  Reason for call: pt states that the pharmacy informed her that they received the rx for alprazolam Prudy Feeler(XANAX) 2 MG tablet , however it did not have attending physician or the NPI # on it. Need rx refaxed .

## 2015-08-23 NOTE — Telephone Encounter (Signed)
Rx reprinted -- NPI and attending MD listed on Rx. Refaxed to mail order pharmacy. Not sure what the issue was last time as those things always print out with Rx. Must have been computer error.

## 2015-08-24 ENCOUNTER — Encounter: Payer: Self-pay | Admitting: Physician Assistant

## 2015-08-25 NOTE — Telephone Encounter (Signed)
Called [facility] to follow up regarding xanax refill.  Associate stated that there is a hold on the medication.  They need DEA and NPI of the supervising provider to process the request.  Information was then given by phone.  Per Ricki Rodriguezdriana this is all that was needed in order to process rx.    Called patient to make her aware.  Left a message for call back.

## 2015-08-28 NOTE — Telephone Encounter (Signed)
Pt returned call.  Made aware that the information was provided to pharmacy and that her Rx should have processed.  She was advised to call them back regarding Rx and to call back if she encounter further barriers.  She stated understanding and agreed.

## 2015-09-19 ENCOUNTER — Encounter: Payer: Self-pay | Admitting: Physician Assistant

## 2015-09-20 MED ORDER — OMEPRAZOLE 20 MG PO CPDR: 20.0000 mg | DELAYED_RELEASE_CAPSULE | Freq: Every day | ORAL | Status: DC

## 2015-10-03 ENCOUNTER — Telehealth: Payer: Self-pay | Admitting: *Deleted

## 2015-10-03 NOTE — Telephone Encounter (Signed)
Requesting Xanax 2mg -Take 0.5mg  tablet by mouth 3 times daily as needed for anxiety. Last refill:08/23/15:#90,0 Last OV:07/21/15-Has an appt scheduled for-11/24/15 UDS:07/21/15-Moderate risk-Next screen:10/21/15 Please advise.//AB/CMA

## 2015-10-04 MED ORDER — ALPRAZOLAM 2 MG PO TABS: 1.0000 mg | ORAL_TABLET | Freq: Three times a day (TID) | ORAL | Status: DC | PRN

## 2015-10-04 NOTE — Telephone Encounter (Signed)
Ok for #30, no refills.  If she wants the full amount she will need to wait until PCP returns

## 2015-10-04 NOTE — Telephone Encounter (Signed)
Called and spoke with the pt and informed her of the note below.  Pt verbalized understanding and agreed.  Rx printed and faxed to the pharmacy.//AB/CMA

## 2015-10-06 ENCOUNTER — Ambulatory Visit (INDEPENDENT_AMBULATORY_CARE_PROVIDER_SITE_OTHER): Payer: 59 | Admitting: Family Medicine

## 2015-10-06 ENCOUNTER — Encounter: Payer: Self-pay | Admitting: Family Medicine

## 2015-10-06 VITALS — BP 100/80 | HR 103 | Temp 103.0°F | Ht 65.0 in | Wt 272.9 lb

## 2015-10-06 DIAGNOSIS — N939 Abnormal uterine and vaginal bleeding, unspecified: Secondary | ICD-10-CM | POA: Diagnosis not present

## 2015-10-06 LAB — CBC WITH DIFFERENTIAL/PLATELET
BASOS ABS: 0 10*3/uL (ref 0.0–0.1)
Basophils Relative: 0 % (ref 0–1)
EOS ABS: 0.1 10*3/uL (ref 0.0–0.7)
EOS PCT: 1 % (ref 0–5)
HCT: 37.8 % (ref 36.0–46.0)
Hemoglobin: 11.9 g/dL — ABNORMAL LOW (ref 12.0–15.0)
LYMPHS ABS: 2.6 10*3/uL (ref 0.7–4.0)
Lymphocytes Relative: 29 % (ref 12–46)
MCH: 25.2 pg — AB (ref 26.0–34.0)
MCHC: 31.5 g/dL (ref 30.0–36.0)
MCV: 79.9 fL (ref 78.0–100.0)
MONO ABS: 0.9 10*3/uL (ref 0.1–1.0)
MPV: 10.4 fL (ref 8.6–12.4)
Monocytes Relative: 10 % (ref 3–12)
Neutro Abs: 5.5 10*3/uL (ref 1.7–7.7)
Neutrophils Relative %: 60 % (ref 43–77)
PLATELETS: 346 10*3/uL (ref 150–400)
RBC: 4.73 MIL/uL (ref 3.87–5.11)
RDW: 15.9 % — AB (ref 11.5–15.5)
WBC: 9.1 10*3/uL (ref 4.0–10.5)

## 2015-10-06 LAB — POCT URINE PREGNANCY: Preg Test, Ur: NEGATIVE

## 2015-10-06 NOTE — Patient Instructions (Signed)

## 2015-10-06 NOTE — Progress Notes (Signed)
Subjective:    Patient ID: Toni [name], female    DOB: 08-27-1976, 39 y.o.   MRN: [id number]  HPI Patient seen with abnormal uterine bleeding Usually has normal periods but over the past few months has had some occasional spotting between periods She has reported von Willebrand trait. Sometimes has easy bruising.  Denies any gross hematuria. She is fairly certain this is vaginal spotting-as source of bleeding. She recalls normal menstrual period November 2 and then normal menstrual Dec 14th-lasting about 5 days which is her norm. Yesterday on the 29th she had recurrence of menstrual bleeding. For the past 3 or 4 month she's had some intermittent spotting between periods. She has occasional abdominal cramping but no pain. Patient relates bilateral tubal ligation 2006. No dizziness or syncope.   Past Medical History  Diagnosis Date  . Seizure (HCC)     s/p eval with neuro, no medication, no events since  . Syncope     with ? seizure in 2015, eval with neruo  . Migraine   . GERD (gastroesophageal reflux disease)     hx gastric ulcer and upper GI bleed, ? NSAID induced  . Anxiety and depression     started with PPD  . Allergy   . Hypertension   . Bacterial vaginitis   . Vitamin D deficiency   . Vitamin B12 deficiency   . Chronic neck pain     sees [place] [facility] for neck strain   Past Surgical History  Procedure Laterality Date  . Gastric bypass  2002  . Cesarean section  2006  . Tubuligation  2006  . Tonsillectomy and adenoidectomy    . Tubes in ears both Bilateral   . Eye surgery Bilateral     2001    reports that she has never smoked. She has never used smokeless tobacco. She reports that she does not drink alcohol or use illicit drugs. family history includes Aneurysm in her father; Arthritis in her mother; Asthma in her mother; Diabetes in her mother; Heart disease in her mother; Hyperlipidemia in her mother; Lung cancer in her maternal  aunt. Allergies  Allergen Reactions  . Ibuprofen     Causes ulcers      Review of Systems  Constitutional: Negative for fever and chills.  Respiratory: Negative for shortness of breath.   Cardiovascular: Negative for chest pain.  Gastrointestinal: Negative for nausea, vomiting, abdominal pain and blood in stool.  Genitourinary: Positive for menstrual problem. Negative for dysuria, hematuria, vaginal pain and pelvic pain.       Objective:   Physical Exam  Constitutional: She appears well-developed and well-nourished.  Neck: Neck supple. No thyromegaly present.  Cardiovascular: Normal rate and regular rhythm.   Pulmonary/Chest: Effort normal and breath sounds normal. No respiratory distress. She has no wheezes. She has no rales.  Genitourinary:  Normal external genitalia. Patient has some dried blood in the vaginal vault. Cervix is grossly normal in appearance. Patient has minimal tenderness right adnexa. No masses palpated. No vaginal discharge          Assessment & Plan:  Abnormal uterine bleeding. Patient has had some recent intermenstrual spotting for the past few months and current period started 2 weeks early. History of reported von Willebrand trait-though she has not had any chronic bleeding problems related to this. Check CBC and TSH. She does not have any galactorrhea, headaches or other symptoms to suggest likely prolactin issue. We discussed possible pelvic ultrasound but start with the above  labs first. She is encouraged to follow-up with her primary provider if symptoms persist. She has scheduled gynecologic follow-up in February and has gotten regular Pap smears in the past

## 2015-10-07 LAB — TSH: TSH: 1.12 u[IU]/mL (ref 0.350–4.500)

## 2015-10-09 ENCOUNTER — Encounter: Payer: Self-pay | Admitting: Family Medicine

## 2015-10-09 ENCOUNTER — Inpatient Hospital Stay (HOSPITAL_COMMUNITY)
Admission: AD | Admit: 2015-10-09 | Discharge: 2015-10-09 | Discharge status: Against Medical Advice | Payer: 59 | Source: Ambulatory Visit | Attending: Family Medicine | Admitting: Family Medicine

## 2015-10-09 DIAGNOSIS — Z3200 Encounter for pregnancy test, result unknown: Secondary | ICD-10-CM | POA: Diagnosis present

## 2015-10-09 DIAGNOSIS — Z3202 Encounter for pregnancy test, result negative: Secondary | ICD-10-CM | POA: Diagnosis not present

## 2015-10-09 LAB — POCT PREGNANCY, URINE: PREG TEST UR: NEGATIVE

## 2015-10-09 NOTE — MAU Note (Signed)
Pt signed out AMA, unable to stay, states she will try to see her MD tomorrow.

## 2015-10-09 NOTE — MAU Note (Addendum)
Pt C/O breakthrough bleeding between cycles 2 months.  Had period on 12/14 - somewhat heavy, started bleeding again on Thursday, is heavy with clots.  C/O lower abd cramping. Saw MD on Friday - had pelvic exam & CBC done, his office is closed today.  Hx of von Wildebrand's trait.

## 2015-10-10 NOTE — Telephone Encounter (Signed)
Okay to refer pt to OBGYN?

## 2015-10-11 DIAGNOSIS — N926 Irregular menstruation, unspecified: Secondary | ICD-10-CM | POA: Diagnosis not present

## 2015-10-12 ENCOUNTER — Ambulatory Visit: Payer: BLUE CROSS/BLUE SHIELD | Admitting: Podiatry

## 2015-10-19 DIAGNOSIS — H52223 Regular astigmatism, bilateral: Secondary | ICD-10-CM | POA: Diagnosis not present

## 2015-10-19 DIAGNOSIS — I10 Essential (primary) hypertension: Secondary | ICD-10-CM | POA: Diagnosis not present

## 2015-10-19 DIAGNOSIS — H35033 Hypertensive retinopathy, bilateral: Secondary | ICD-10-CM | POA: Diagnosis not present

## 2015-10-19 DIAGNOSIS — H5213 Myopia, bilateral: Secondary | ICD-10-CM | POA: Diagnosis not present

## 2015-10-19 DIAGNOSIS — H1789 Other corneal scars and opacities: Secondary | ICD-10-CM | POA: Diagnosis not present

## 2015-10-29 ENCOUNTER — Encounter: Payer: Self-pay | Admitting: Physician Assistant

## 2015-10-29 DIAGNOSIS — G8929 Other chronic pain: Secondary | ICD-10-CM

## 2015-10-29 DIAGNOSIS — R1013 Epigastric pain: Secondary | ICD-10-CM

## 2015-10-29 DIAGNOSIS — K219 Gastro-esophageal reflux disease without esophagitis: Secondary | ICD-10-CM

## 2015-10-30 ENCOUNTER — Encounter: Payer: Self-pay | Admitting: Physician Assistant

## 2015-11-15 DIAGNOSIS — M791 Myalgia: Secondary | ICD-10-CM | POA: Diagnosis not present

## 2015-11-15 DIAGNOSIS — G894 Chronic pain syndrome: Secondary | ICD-10-CM | POA: Diagnosis not present

## 2015-11-15 DIAGNOSIS — M25571 Pain in right ankle and joints of right foot: Secondary | ICD-10-CM | POA: Diagnosis not present

## 2015-11-15 DIAGNOSIS — M542 Cervicalgia: Secondary | ICD-10-CM | POA: Diagnosis not present

## 2015-11-16 ENCOUNTER — Other Ambulatory Visit (INDEPENDENT_AMBULATORY_CARE_PROVIDER_SITE_OTHER): Payer: 59

## 2015-11-16 ENCOUNTER — Ambulatory Visit (INDEPENDENT_AMBULATORY_CARE_PROVIDER_SITE_OTHER): Payer: 59 | Admitting: Physician Assistant

## 2015-11-16 ENCOUNTER — Encounter: Payer: Self-pay | Admitting: Physician Assistant

## 2015-11-16 VITALS — BP 140/86 | HR 82 | Ht 66.0 in | Wt 270.0 lb

## 2015-11-16 DIAGNOSIS — D509 Iron deficiency anemia, unspecified: Secondary | ICD-10-CM

## 2015-11-16 DIAGNOSIS — R1013 Epigastric pain: Secondary | ICD-10-CM

## 2015-11-16 DIAGNOSIS — K289 Gastrojejunal ulcer, unspecified as acute or chronic, without hemorrhage or perforation: Secondary | ICD-10-CM | POA: Diagnosis not present

## 2015-11-16 LAB — CBC WITH DIFFERENTIAL/PLATELET
BASOS ABS: 0 10*3/uL (ref 0.0–0.1)
Basophils Relative: 0.4 % (ref 0.0–3.0)
EOS ABS: 0 10*3/uL (ref 0.0–0.7)
Eosinophils Relative: 0.3 % (ref 0.0–5.0)
HCT: 38.3 % (ref 36.0–46.0)
Hemoglobin: 12.3 g/dL (ref 12.0–15.0)
LYMPHS ABS: 2.9 10*3/uL (ref 0.7–4.0)
Lymphocytes Relative: 34.9 % (ref 12.0–46.0)
MCHC: 32.1 g/dL (ref 30.0–36.0)
MCV: 78.4 fl (ref 78.0–100.0)
MONO ABS: 0.5 10*3/uL (ref 0.1–1.0)
MONOS PCT: 6.6 % (ref 3.0–12.0)
NEUTROS ABS: 4.8 10*3/uL (ref 1.4–7.7)
NEUTROS PCT: 57.8 % (ref 43.0–77.0)
PLATELETS: 306 10*3/uL (ref 150.0–400.0)
RBC: 4.89 Mil/uL (ref 3.87–5.11)
RDW: 16.9 % — ABNORMAL HIGH (ref 11.5–15.5)
WBC: 8.2 10*3/uL (ref 4.0–10.5)

## 2015-11-16 LAB — IBC PANEL
IRON: 25 ug/dL — AB (ref 42–145)
SATURATION RATIOS: 5.3 % — AB (ref 20.0–50.0)
TRANSFERRIN: 335 mg/dL (ref 212.0–360.0)

## 2015-11-16 LAB — FERRITIN: FERRITIN: 5.3 ng/mL — AB (ref 10.0–291.0)

## 2015-11-16 MED ORDER — OMEPRAZOLE 20 MG PO CPDR: 20.0000 mg | DELAYED_RELEASE_CAPSULE | Freq: Two times a day (BID) | ORAL | Status: DC

## 2015-11-16 MED FILL — OMEPRAZOLE DR 20 MG CAPSULE: 20 | 30 days supply | Qty: 60 | Fill #0

## 2015-11-16 NOTE — Patient Instructions (Signed)
Please go to the basement level to have your labs drawn.  Increase Prilosec 20 mg to twice daily. We sent refills to the pharmacy [facility].  You have been scheduled for an endoscopy. Please follow written instructions given to you at your visit today. If you use inhalers (even only as needed), please bring them with you on the day of your procedure. Your physician has requested that you go to www.startemmi.com and enter the access code given to you at your visit today. This web site gives a general overview about your procedure. However, you should still follow specific instructions given to you by our office regarding your preparation for the procedure.

## 2015-11-16 NOTE — Progress Notes (Signed)
Patient ID: Toni [name], female   DOB: 08-20-1976, 40 y.o.   MRN: [id number]   Subjective:    Patient ID: Toni [name], female    DOB: January 23, 1976, 40 y.o.   MRN: [id number]  HPI  Toni [name] is a pleasant 40 year old African-American female referred today by Toni Cogan PA-C. She is new Toni [name] but had undergone remote EGD with Dr. Bosie [name] in 2006 at which time she was admitted with a [name] bleed. She was found to have an anastomotic ulcer. Patient is status post Roux-en-Y gastric bypass done in 2002, she has had B12 deficiency. She comes in today stating that she has been on medication for her stomach and for acid reflux ever since 2006. She has generally taken omeprazole 20 mg by mouth every morning. She says she was switched to Protonix at one point but did not find that worked as well and went back to [employer]. She says over the past year she has been having problems with intermittent epigastric pain and burning and cramping. She has no complaints of dysphagia or odynophagia and really does not have much in the way of heartburn. He has not been taking any aspirin or NSAIDs. She feels that her epigastric discomfort is worse postprandially. She is also undergoing GYN workup at this time for excessive vaginal bleeding. She says she has an appointment tomorrow. Recent labs done on 10/06/2015 hemoglobin 11.9 hematocrit of 37.8 MCV of 79 platelets 346. She is on chronic B12 replacement as not required iron replacement in the past.   Review of Systems Pertinent positive and negative review of systems were noted in the above HPI section.  All other review of systems was otherwise negative.  Outpatient Encounter Prescriptions as of 11/16/2015  Medication Sig  . alprazolam (XANAX) 2 MG tablet Take 0.5 tablets (1 mg total) by mouth 3 (three) times daily as needed for anxiety.  Marland Kitchen amitriptyline (ELAVIL) 10 MG tablet Take 1 tablet (10 mg total) by mouth at bedtime.  Marland Kitchen BIOTIN PO Take by mouth  daily.  . Calcium 250 MG CAPS Take by mouth daily.  . cetirizine (ZYRTEC) 10 MG tablet Take 10 mg by mouth daily.  . Cholecalciferol (VITAMIN D) 2000 UNITS CAPS Take by mouth daily.  Marland Kitchen eletriptan (RELPAX) 40 MG tablet Take 1 tablet (40 mg total) by mouth as needed for migraine or headache. One tablet by mouth at onset of headache. May repeat in 2 hours if headache persists or recurs.  . fluticasone (FLONASE) 50 MCG/ACT nasal spray Place into both nostrils daily.  . hydrochlorothiazide (HYDRODIURIL) 25 MG tablet Take 25 mg by mouth daily.  . methocarbamol (ROBAXIN) 500 MG tablet Take 500 mg by mouth every 8 (eight) hours as needed for muscle spasms.  Marland Kitchen omeprazole (PRILOSEC) 20 MG capsule Take 1 capsule (20 mg total) by mouth 2 (two) times daily before a meal.  . ondansetron (ZOFRAN) 8 MG tablet Take by mouth every 8 (eight) hours as needed for nausea or vomiting.  Marland Kitchen oxyCODONE-acetaminophen (PERCOCET) 10-325 MG per tablet Take 1 tablet by mouth every 8 (eight) hours as needed for pain. Take 1 tablet by mouth every 8 hours as needed for severe pain.  . vitamin B-12 (CYANOCOBALAMIN) 1000 MCG tablet Take 1,000 mcg by mouth daily.  . [DISCONTINUED] omeprazole (PRILOSEC) 20 MG capsule Take 1 capsule (20 mg total) by mouth daily.   No facility-administered encounter medications on file as of 11/16/2015.   Allergies  Allergen Reactions  . Ibuprofen  Causes ulcers   Patient Active Problem List   Diagnosis Date Noted  . Vaginal discharge 07/21/2015  . Vitamin B deficiency 06/05/2015  . Vitamin D deficiency 04/30/2015  . Chronic neck pain 04/11/2015  . Esophageal reflux 04/11/2015  . Obesity 04/11/2015  . Anxiety and depression 04/11/2015  . Migraine    Social History   Social History  . Marital Status: Married    Spouse Name: N/A  . Number of Children: N/A  . Years of Education: N/A   Occupational History  . Not on file.   Social History Main Topics  . Smoking status: Never Smoker     . Smokeless tobacco: Never Used  . Alcohol Use: 0.0 oz/week    0 Standard drinks or equivalent per week     Comment: occasional   . Drug Use: No  . Sexual Activity: Not on file   Other Topics Concern  . Not on file   Social History Narrative    [name]'s family history includes Aneurysm in her father; Arthritis in her mother; Asthma in her mother; Diabetes in her mother; Heart disease in her mother; Hyperlipidemia in her mother; Lung cancer in her maternal aunt.      Objective:    Filed Vitals:   11/16/15 1030  BP: 140/86  Pulse: 82    Physical Exam  well-developed African American female in no acute distress, pleasant blood pressure 140/86 pulse 82 height 5 foot 6 weight 270, BMI 43.6. HEENT ;nontraumatic normocephalic EOMI PERRLA sclera anicteric, Cardiovascular; regular rate and rhythm with S1-S2, Pulmonary; clear bilaterally, Abdomen; soft, bowel sounds are present she has some mild epigastric tenderness no guarding or rebound no palpable mass or hepatosplenomegaly bowel sounds present, Rectal; exam not done, Extremities ;no clubbing cyanosis or edema skin warm and dry, Neuropsych; mood and affect appropriate     Assessment & Plan:   #1 40 yo female s/p Roux-en -y gastric bypass 2002 with anastomotic ulcer with bleed 2006, now with one year hx of persistent intermittent epigastric pain and burning despite Omeprazole 20 mg daily  R/O recurrent anastomotic ulcer , gastropathy, Hpylori #2 chronic b12 deficiency- on replacement #3 mild microcytic anemia- likely related to menorrhagia #4 obesity #5 Family hx of colon cancer - Toni [name]-age 77's  Plan; increase Prilosec to 20 mg po BID short term Schedule for EGD with Dr Toni [name]-procedure discussed in detail with pt ans she is agreeable to proceed Cbc, Fe studies today  Toni [name] Toni Hillock PA-C 11/16/2015   Cc: Toni Merl, PA-C

## 2015-11-16 NOTE — Progress Notes (Signed)
Agree with assessment and plan. I would otherwise recommend she break open prilosec capsules and ingest with food and take them that way to maximize absorption given gastric bypass status, as she is likely not absorbing much PPI if she is not breaking open the capsules first. We will await EGD results.

## 2015-11-17 ENCOUNTER — Other Ambulatory Visit: Payer: Self-pay | Admitting: Obstetrics & Gynecology

## 2015-11-17 DIAGNOSIS — R300 Dysuria: Secondary | ICD-10-CM | POA: Diagnosis not present

## 2015-11-17 DIAGNOSIS — N898 Other specified noninflammatory disorders of vagina: Secondary | ICD-10-CM | POA: Diagnosis not present

## 2015-11-17 DIAGNOSIS — N938 Other specified abnormal uterine and vaginal bleeding: Secondary | ICD-10-CM | POA: Diagnosis not present

## 2015-11-17 DIAGNOSIS — Z1231 Encounter for screening mammogram for malignant neoplasm of breast: Secondary | ICD-10-CM | POA: Diagnosis not present

## 2015-11-17 DIAGNOSIS — N926 Irregular menstruation, unspecified: Secondary | ICD-10-CM | POA: Diagnosis not present

## 2015-11-17 DIAGNOSIS — N939 Abnormal uterine and vaginal bleeding, unspecified: Secondary | ICD-10-CM | POA: Diagnosis not present

## 2015-11-17 DIAGNOSIS — B373 Candidiasis of vulva and vagina: Secondary | ICD-10-CM | POA: Diagnosis not present

## 2015-11-17 LAB — HM MAMMOGRAPHY

## 2015-11-20 ENCOUNTER — Ambulatory Visit (AMBULATORY_SURGERY_CENTER): Payer: 59 | Admitting: Gastroenterology

## 2015-11-20 ENCOUNTER — Encounter: Payer: Self-pay | Admitting: Gastroenterology

## 2015-11-20 VITALS — BP 111/54 | HR 66 | Temp 98.7°F | Resp 18 | Ht 66.0 in | Wt 270.0 lb

## 2015-11-20 DIAGNOSIS — R1013 Epigastric pain: Secondary | ICD-10-CM | POA: Diagnosis not present

## 2015-11-20 DIAGNOSIS — K2950 Unspecified chronic gastritis without bleeding: Secondary | ICD-10-CM | POA: Diagnosis not present

## 2015-11-20 DIAGNOSIS — K219 Gastro-esophageal reflux disease without esophagitis: Secondary | ICD-10-CM | POA: Diagnosis not present

## 2015-11-20 MED ORDER — SODIUM CHLORIDE 0.9 % IV SOLN: 500.0000 mL | INTRAVENOUS | Status: DC

## 2015-11-20 NOTE — Progress Notes (Signed)
Called to room to assist during endoscopic procedure.  Patient ID and intended procedure confirmed with present staff. Received instructions for my participation in the procedure from the performing physician.  

## 2015-11-20 NOTE — Progress Notes (Signed)
  [facility] Anesthesia Post-op Note  Patient: Toni [name]  Procedure(s) Performed: endoscopy  Patient Location: [facility] - Recovery Area  Anesthesia Type: Deep Sedation/Propofol  Level of Consciousness: awake, oriented and patient cooperative  Airway and Oxygen Therapy: Patient Spontanous Breathing  Post-op Pain: none  Post-op Assessment:  Post-op Vital signs reviewed, Patient's Cardiovascular Status Stable, Respiratory Function Stable, Patent Airway, No signs of Nausea or vomiting and Pain level controlled  Post-op Vital Signs: Reviewed and stable  Complications: No apparent anesthesia complications  Kemar Pandit E 2:31 PM

## 2015-11-20 NOTE — Op Note (Signed)
[facility] [address] [place], [address]   ENDOSCOPY PROCEDURE REPORT  PATIENT: Maricel, Swartzendruber  MR#: [id number] BIRTHDATE: 10/24/1975 , 40  yrs. old GENDER: female ENDOSCOPIST: Benancio Deeds, MD REFERRED BY: PROCEDURE DATE:  11/20/2015 PROCEDURE:  EGD w/ biopsy ASA CLASS:     Class II INDICATIONS:  dyspepsia and heartburn, history of gastric bypass with marginal ulcer. MEDICATIONS: Propofol 350 mg IV TOPICAL ANESTHETIC:  DESCRIPTION OF PROCEDURE: After the risks benefits and alternatives of the procedure were thoroughly explained, informed consent was obtained.  The LB [id number] endoscope was introduced through the mouth and advanced to the second portion of the duodenum , Without limitations.  The instrument was slowly withdrawn as the mucosa was fully examined.    FINDINGS: The esophagus was normal in appearance.  DH, GEJ, and SCJ located 33cm from the incisors, with normal appearing GEJ.  Normal gastric pouch was appreciated without ulcerations or erosions. Biopsies taken to rule out H pylori.  The surgical anastomosis was normal and widely patent.  No ulcerations appreciated.  The blind limb was normal, as was the distal small bowel.  Retroflexed views revealed no abnormalities.     The scope was then withdrawn from the patient and the procedure completed.  COMPLICATIONS: There were no immediate complications.  ENDOSCOPIC IMPRESSION: Normal esophagus Normal gastric pouch, biopsies obtained Normal surgical anastomosis, no marginal ulcers appreciated, normal small bowel  RECOMMENDATIONS: Await pathology results Resume diet Resume medications Break omeprazole capsules open and mix with food to maximize absorption given gastric bypass state  eSigned:  Benancio Deeds, MD 11/20/2015 2:30 PM    CC: the patient

## 2015-11-20 NOTE — Patient Instructions (Signed)
YOU HAD AN ENDOSCOPIC PROCEDURE TODAY AT THE [facility]:   Refer to the procedure report that was given to you for any specific questions about what was found during the examination.  If the procedure report does not answer your questions, please call your gastroenterologist to clarify.  If you requested that your care partner not be given the details of your procedure findings, then the procedure report has been included in a sealed envelope for you to review at your convenience later.  YOU SHOULD EXPECT: Some feelings of bloating in the abdomen. Passage of more gas than usual.  Walking can help get rid of the air that was put into your GI tract during the procedure and reduce the bloating. If you had a lower endoscopy (such as a colonoscopy or flexible sigmoidoscopy) you may notice spotting of blood in your stool or on the toilet paper. If you underwent a bowel prep for your procedure, you may not have a normal bowel movement for a few days.  Please Note:  You might notice some irritation and congestion in your nose or some drainage.  This is from the oxygen used during your procedure.  There is no need for concern and it should clear up in a day or so.  SYMPTOMS TO REPORT IMMEDIATELY:    Following upper endoscopy (EGD)  Vomiting of blood or coffee ground material  New chest pain or pain under the shoulder blades  Painful or persistently difficult swallowing  New shortness of breath  Fever of 100F or higher  Black, tarry-looking stools  For urgent or emergent issues, a gastroenterologist can be reached at any hour by calling [phone].   DIET: Your first meal following the procedure should be a small meal and then it is ok to progress to your normal diet. Heavy or fried foods are harder to digest and may make you feel nauseous or bloated.  Likewise, meals heavy in dairy and vegetables can increase bloating.  Drink plenty of fluids but you should avoid alcoholic beverages  for 24 hours.  ACTIVITY:  You should plan to take it easy for the rest of today and you should NOT DRIVE or use heavy machinery until tomorrow (because of the sedation medicines used during the test).    FOLLOW UP: Our staff will call the number listed on your records the next business day following your procedure to check on you and address any questions or concerns that you may have regarding the information given to you following your procedure. If we do not reach you, we will leave a message.  However, if you are feeling well and you are not experiencing any problems, there is no need to return our call.  We will assume that you have returned to your regular daily activities without incident.  If any biopsies were taken you will be contacted by phone or by letter within the next 1-3 weeks.  Please call us at [phone] if you have not heard about the biopsies in 3 weeks.    SIGNATURES/CONFIDENTIALITY: You and/or your care partner have signed paperwork which will be entered into your electronic medical record.  These signatures attest to the fact that that the information above on your After Visit Summary has been reviewed and is understood.  Full responsibility of the confidentiality of this discharge information lies with you and/or your care-partner.  Break omeprazole capsules and mix with food to maximize absorption.  Wait biopsy results.

## 2015-11-21 ENCOUNTER — Telehealth: Payer: Self-pay

## 2015-11-21 NOTE — Telephone Encounter (Signed)
  Follow up Call-  Call back number 11/20/2015  Post procedure Call Back phone  # [phone]  Permission to leave phone message Yes     Patient was called for follow up after procedure on 11/20/2015. No answer at the number given for follow up phone call. A message was left on the answering machine.

## 2015-11-23 ENCOUNTER — Encounter: Payer: Self-pay | Admitting: Behavioral Health

## 2015-11-23 ENCOUNTER — Telehealth: Payer: Self-pay | Admitting: Behavioral Health

## 2015-11-23 NOTE — Telephone Encounter (Signed)
Pre-Visit Call completed with patient and chart updated.   Pre-Visit Info documented in Specialty Comments under [employer].    

## 2015-11-24 ENCOUNTER — Ambulatory Visit (INDEPENDENT_AMBULATORY_CARE_PROVIDER_SITE_OTHER): Payer: 59 | Admitting: Physician Assistant

## 2015-11-24 ENCOUNTER — Encounter: Payer: Self-pay | Admitting: Physician Assistant

## 2015-11-24 VITALS — BP 110/82 | HR 81 | Temp 98.8°F | Ht 66.0 in | Wt 269.2 lb

## 2015-11-24 DIAGNOSIS — Z0000 Encounter for general adult medical examination without abnormal findings: Secondary | ICD-10-CM | POA: Insufficient documentation

## 2015-11-24 DIAGNOSIS — E538 Deficiency of other specified B group vitamins: Secondary | ICD-10-CM

## 2015-11-24 DIAGNOSIS — F39 Unspecified mood [affective] disorder: Secondary | ICD-10-CM | POA: Diagnosis not present

## 2015-11-24 DIAGNOSIS — G8929 Other chronic pain: Secondary | ICD-10-CM

## 2015-11-24 DIAGNOSIS — M542 Cervicalgia: Secondary | ICD-10-CM | POA: Diagnosis not present

## 2015-11-24 LAB — CBC
HEMATOCRIT: 35 % — AB (ref 36.0–46.0)
Hemoglobin: 11.4 g/dL — ABNORMAL LOW (ref 12.0–15.0)
MCHC: 32.7 g/dL (ref 30.0–36.0)
MCV: 78.5 fl (ref 78.0–100.0)
PLATELETS: 300 10*3/uL (ref 150.0–400.0)
RBC: 4.46 Mil/uL (ref 3.87–5.11)
RDW: 17 % — ABNORMAL HIGH (ref 11.5–15.5)
WBC: 8.5 10*3/uL (ref 4.0–10.5)

## 2015-11-24 LAB — COMPREHENSIVE METABOLIC PANEL
ALK PHOS: 72 U/L (ref 39–117)
ALT: 11 U/L (ref 0–35)
AST: 16 U/L (ref 0–37)
Albumin: 3.9 g/dL (ref 3.5–5.2)
BUN: 12 mg/dL (ref 6–23)
CHLORIDE: 108 meq/L (ref 96–112)
CO2: 23 mEq/L (ref 19–32)
Calcium: 8.9 mg/dL (ref 8.4–10.5)
Creatinine, Ser: 0.84 mg/dL (ref 0.40–1.20)
GFR: 96.53 mL/min (ref >60.00)
GLUCOSE: 74 mg/dL (ref 70–99)
POTASSIUM: 3.8 meq/L (ref 3.5–5.1)
Sodium: 139 mEq/L (ref 135–145)
Total Bilirubin: 0.3 mg/dL (ref 0.2–1.2)
Total Protein: 7.1 g/dL (ref 6.0–8.3)

## 2015-11-24 LAB — URINALYSIS, ROUTINE W REFLEX MICROSCOPIC
Hgb urine dipstick: NEGATIVE
LEUKOCYTES UA: NEGATIVE
Nitrite: NEGATIVE
PH: 6 (ref 5.0–8.0)
SPECIFIC GRAVITY, URINE: 1.025 (ref 1.000–1.030)
URINE GLUCOSE: NEGATIVE
UROBILINOGEN UA: 0.2 (ref 0.0–1.0)

## 2015-11-24 LAB — LIPID PANEL
CHOL/HDL RATIO: 3
Cholesterol: 185 mg/dL (ref 0–200)
HDL: 64.3 mg/dL (ref >39.00)
LDL CALC: 97 mg/dL (ref 0–99)
NonHDL: 120.53
Triglycerides: 119 mg/dL (ref 0.0–149.0)
VLDL: 23.8 mg/dL (ref 0.0–40.0)

## 2015-11-24 LAB — TSH: TSH: 1.08 u[IU]/mL (ref 0.35–4.50)

## 2015-11-24 LAB — HEMOGLOBIN A1C: Hgb A1c MFr Bld: 5.2 % (ref 4.6–6.5)

## 2015-11-24 MED ORDER — AMITRIPTYLINE HCL 10 MG PO TABS: 10.0000 mg | ORAL_TABLET | Freq: Every day | ORAL | Status: DC

## 2015-11-24 MED ORDER — CETIRIZINE HCL 10 MG PO TABS: 10.0000 mg | ORAL_TABLET | Freq: Every day | ORAL | Status: DC

## 2015-11-24 MED ORDER — CYANOCOBALAMIN 1000 MCG/ML IJ SOLN
1000.0000 ug | Freq: Once | INTRAMUSCULAR | Status: AC
  Administered 2015-11-24: 1000 ug via INTRAMUSCULAR

## 2015-11-24 MED ORDER — ALPRAZOLAM 2 MG PO TABS: 1.0000 mg | ORAL_TABLET | Freq: Three times a day (TID) | ORAL | Status: DC | PRN

## 2015-11-24 MED FILL — ALPRAZolam 2 MG TABS: 2 | 60 days supply | Qty: 90 | Fill #0

## 2015-11-24 MED FILL — AMITRIPTYLINE HCL 10 MG TAB: 10 | 30 days supply | Qty: 30 | Fill #0

## 2015-11-24 NOTE — Addendum Note (Signed)
Addended by: Verdie Shire on: 11/24/2015 09:22 AM   Modules accepted: Orders

## 2015-11-24 NOTE — Assessment & Plan Note (Signed)
Depression screen negative. Health Maintenance reviewed -- immunizations, pap and mammogram up-to-date. Preventive schedule discussed and handout given in AVS. Will obtain fasting labs today.

## 2015-11-24 NOTE — Progress Notes (Signed)
Pre visit review using our clinic review tool, if applicable. No additional management support is needed unless otherwise documented below in the visit note. 

## 2015-11-24 NOTE — Progress Notes (Signed)
  Patient presents to clinic today for annual exam.  Patient is fasting for labs.  Acute Concerns: Denies acute concerns at today's visit.  Chronic Issues: Anxiety and Depression -- doing well with current regimen of Elavil and Xanax. Is sleeping well. Denies SI/HI.  Migraines -- Endorses very rare headaches now that she is on the Amitriptyline. Is very happy with improvement in symptoms.  Health Maintenance: Immunizations -- up-to-date Mammogram -- up-to-date PAP --up-to-date  Past Medical History  Diagnosis Date  . Syncope     with ? seizure in 2015, eval with neruo  . Migraine   . GERD (gastroesophageal reflux disease)     hx gastric ulcer and upper GI bleed, ? NSAID induced  . Anxiety and depression     started with PPD  . Allergy   . Hypertension   . Bacterial vaginitis   . Vitamin D deficiency   . Vitamin B12 deficiency   . Chronic neck pain     sees [place] [facility] for neck strain  . Seizure ([facility])     s/p eval with neuro, no medication, no events since    Past Surgical History  Procedure Laterality Date  . Gastric bypass  2002  . Cesarean section  2006  . Tubuligation  2006  . Tonsillectomy and adenoidectomy    . Tubes in ears both Bilateral   . Eye surgery Bilateral     2001  . Gastric bypass    . Endometrial biopsy      Current Outpatient Prescriptions on File Prior to Visit  Medication Sig Dispense Refill  . BIOTIN PO Take by mouth daily.    . Calcium 250 MG CAPS Take by mouth daily.    . Cholecalciferol (VITAMIN D) 2000 UNITS CAPS Take by mouth daily.    . eletriptan (RELPAX) 40 MG tablet Take 1 tablet (40 mg total) by mouth as needed for migraine or headache. One tablet by mouth at onset of headache. May repeat in 2 hours if headache persists or recurs. 10 tablet 3  . fluticasone (FLONASE) 50 MCG/ACT nasal spray Place into both nostrils daily.    . hydrochlorothiazide (HYDRODIURIL) 25 MG tablet Take 25 mg by mouth daily.    .  methocarbamol (ROBAXIN) 500 MG tablet Take 500 mg by mouth every 8 (eight) hours as needed for muscle spasms.    . MetroNIDAZOLE (FLAGYL PO) Take 1 tablet by mouth every 12 (twelve) hours.    . MONONESSA 0.25-35 MG-MCG tablet TK 1 T PO QD  4  . omeprazole (PRILOSEC) 20 MG capsule Take 1 capsule (20 mg total) by mouth 2 (two) times daily before a meal. 60 capsule 1  . ondansetron (ZOFRAN) 8 MG tablet Take by mouth every 8 (eight) hours as needed for nausea or vomiting.    . oxyCODONE-acetaminophen (PERCOCET) 10-325 MG per tablet Take 1 tablet by mouth every 8 (eight) hours as needed for pain. Take 1 tablet by mouth every 8 hours as needed for severe pain. 15 tablet 0  . vitamin B-12 (CYANOCOBALAMIN) 1000 MCG tablet Take 1,000 mcg by mouth daily.     No current facility-administered medications on file prior to visit.    Allergies  Allergen Reactions  . Ibuprofen     Causes ulcers    Family History  Problem Relation Age of Onset  . Arthritis Mother   . Aneurysm Father     deceased  . Lung cancer Maternal Aunt   . Hyperlipidemia Mother   .   Heart disease Mother   . Diabetes Mother   . Asthma Mother     Social History   Social History  . Marital Status: Married    Spouse Name: N/A  . Number of Children: N/A  . Years of Education: N/A   Occupational History  . Not on file.   Social History Main Topics  . Smoking status: Never Smoker   . Smokeless tobacco: Never Used  . Alcohol Use: 0.0 oz/week    0 Standard drinks or equivalent per week     Comment: occasional   . Drug Use: No  . Sexual Activity: Not on file   Other Topics Concern  . Not on file   Social History Narrative   Review of Systems  Constitutional: Negative for fever and weight loss.  HENT: Negative for ear discharge, ear pain, hearing loss and tinnitus.   Eyes: Negative for blurred vision, double vision, photophobia and pain.  Respiratory: Negative for cough and shortness of breath.   Cardiovascular:  Negative for chest pain and palpitations.  Gastrointestinal: Positive for heartburn. Negative for nausea, vomiting, abdominal pain, diarrhea, constipation, blood in stool and melena.  Genitourinary: Negative for dysuria, urgency, frequency, hematuria and flank pain.  Musculoskeletal: Negative for falls.  Neurological: Negative for dizziness, loss of consciousness and headaches.  Endo/Heme/Allergies: Negative for environmental allergies.  Psychiatric/Behavioral: Negative for depression, suicidal ideas, hallucinations and substance abuse. The patient is not nervous/anxious and does not have insomnia.     BP 110/82 mmHg  Pulse 81  Temp(Src) 98.8 F (37.1 C) (Oral)  Ht 5' 6" (1.676 m)  Wt 269 lb 3.2 oz (122.108 kg)  BMI 43.47 kg/m2  SpO2 99%  LMP 11/04/2015  Physical Exam  Constitutional: She is oriented to person, place, and time and well-developed, well-nourished, and in no distress.  HENT:  Head: Normocephalic and atraumatic.  Right Ear: Tympanic membrane, external ear and ear canal normal.  Left Ear: Tympanic membrane, external ear and ear canal normal.  Nose: Nose normal. No mucosal edema.  Mouth/Throat: Uvula is midline, oropharynx is clear and moist and mucous membranes are normal. No oropharyngeal exudate or posterior oropharyngeal erythema.  Eyes: Conjunctivae are normal. Pupils are equal, round, and reactive to light.  Neck: Neck supple. No thyromegaly present.  Cardiovascular: Normal rate, regular rhythm, normal heart sounds and intact distal pulses.   Pulmonary/Chest: Effort normal and breath sounds normal. No respiratory distress. She has no wheezes. She has no rales.  Abdominal: Soft. Bowel sounds are normal. She exhibits no distension and no mass. There is no tenderness. There is no rebound and no guarding.  Lymphadenopathy:    She has no cervical adenopathy.  Neurological: She is alert and oriented to person, place, and time. No cranial nerve deficit.  Skin: Skin is  warm and dry. No rash noted.  Psychiatric: Affect normal.  Vitals reviewed.   Recent Results (from the past 2160 hour(s))  CBC with Differential/Platelet     Status: Abnormal   Collection Time: 10/06/15  4:34 PM  Result Value Ref Range   WBC 9.1 4.0 - 10.5 K/uL   RBC 4.73 3.87 - 5.11 MIL/uL   Hemoglobin 11.9 (L) 12.0 - 15.0 g/dL   HCT 37.8 36.0 - 46.0 %   MCV 79.9 78.0 - 100.0 fL   MCH 25.2 (L) 26.0 - 34.0 pg   MCHC 31.5 30.0 - 36.0 g/dL   RDW 15.9 (H) 11.5 - 15.5 %   Platelets 346 150 - 400 K/uL  MPV 10.4 8.6 - 12.4 fL   Neutrophils Relative % 60 43 - 77 %   Neutro Abs 5.5 1.7 - 7.7 K/uL   Lymphocytes Relative 29 12 - 46 %   Lymphs Abs 2.6 0.7 - 4.0 K/uL   Monocytes Relative 10 3 - 12 %   Monocytes Absolute 0.9 0.1 - 1.0 K/uL   Eosinophils Relative 1 0 - 5 %   Eosinophils Absolute 0.1 0.0 - 0.7 K/uL   Basophils Relative 0 0 - 1 %   Basophils Absolute 0.0 0.0 - 0.1 K/uL   Smear Review Criteria for review not met   TSH     Status: None   Collection Time: 10/06/15  4:34 PM  Result Value Ref Range   TSH 1.120 0.350 - 4.500 uIU/mL  POCT urine pregnancy     Status: None   Collection Time: 10/06/15  4:51 PM  Result Value Ref Range   Preg Test, Ur Negative Negative  Pregnancy, urine POC     Status: None   Collection Time: 10/09/15 12:57 PM  Result Value Ref Range   Preg Test, Ur NEGATIVE NEGATIVE    Comment:        THE SENSITIVITY OF THIS METHODOLOGY IS >24 mIU/mL   CBC with Differential/Platelet     Status: Abnormal   Collection Time: 11/16/15 11:30 AM  Result Value Ref Range   WBC 8.2 4.0 - 10.5 K/uL   RBC 4.89 3.87 - 5.11 Mil/uL   Hemoglobin 12.3 12.0 - 15.0 g/dL   HCT 38.3 36.0 - 46.0 %   MCV 78.4 78.0 - 100.0 fl   MCHC 32.1 30.0 - 36.0 g/dL   RDW 16.9 (H) 11.5 - 15.5 %   Platelets 306.0 150.0 - 400.0 K/uL   Neutrophils Relative % 57.8 43.0 - 77.0 %   Lymphocytes Relative 34.9 12.0 - 46.0 %   Monocytes Relative 6.6 3.0 - 12.0 %   Eosinophils Relative 0.3 0.0  - 5.0 %   Basophils Relative 0.4 0.0 - 3.0 %   Neutro Abs 4.8 1.4 - 7.7 K/uL   Lymphs Abs 2.9 0.7 - 4.0 K/uL   Monocytes Absolute 0.5 0.1 - 1.0 K/uL   Eosinophils Absolute 0.0 0.0 - 0.7 K/uL   Basophils Absolute 0.0 0.0 - 0.1 K/uL  IBC panel     Status: Abnormal   Collection Time: 11/16/15 11:30 AM  Result Value Ref Range   Iron 25 (L) 42 - 145 ug/dL   Transferrin 335.0 212.0 - 360.0 mg/dL   Saturation Ratios 5.3 (L) 20.0 - 50.0 %  Ferritin     Status: Abnormal   Collection Time: 11/16/15 11:30 AM  Result Value Ref Range   Ferritin 5.3 (L) 10.0 - 291.0 ng/mL  [facility] MAMMOGRAPHY     Status: None   Collection Time: 11/17/15 12:00 AM  Result Value Ref Range   [facility] Mammogram      with [facility]; pt. reported; results pending    Assessment/Plan: Visit for preventive health examination Depression screen negative. Health Maintenance reviewed -- immunizations, pap and mammogram up-to-date. Preventive schedule discussed and handout given in AVS. Will obtain fasting labs today.   Anxiety and depression Doing very well. Continue current medication regimen.

## 2015-11-24 NOTE — Patient Instructions (Signed)
Please go to the lab for blood work.  I will call you with your results. If your blood work is normal we will follow-up yearly for physicals.  Please continue chronic medications as directed.  Preventive Care for Adults, Female A healthy lifestyle and preventive care can promote health and wellness. Preventive health guidelines for women include the following key practices.  A routine yearly physical is a good way to check with your health care provider about your health and preventive screening. It is a chance to share any concerns and updates on your health and to receive a thorough exam.  Visit your dentist for a routine exam and preventive care every 6 months. Brush your teeth twice a day and floss once a day. Good oral hygiene prevents tooth decay and gum disease.  The frequency of eye exams is based on your age, health, family medical history, use of contact lenses, and other factors. Follow your health care provider's recommendations for frequency of eye exams.  Eat a healthy diet. Foods like vegetables, fruits, whole grains, low-fat dairy products, and lean protein foods contain the nutrients you need without too many calories. Decrease your intake of foods high in solid fats, added sugars, and salt. Eat the right amount of calories for you.Get information about a proper diet from your health care provider, if necessary.  Regular physical exercise is one of the most important things you can do for your health. Most adults should get at least 150 minutes of moderate-intensity exercise (any activity that increases your heart rate and causes you to sweat) each week. In addition, most adults need muscle-strengthening exercises on 2 or more days a week.  Maintain a healthy weight. The body mass index (BMI) is a screening tool to identify possible weight problems. It provides an estimate of body fat based on height and weight. Your health care provider can find your BMI and can help you achieve  or maintain a healthy weight.For adults 20 years and older:  A BMI below 18.5 is considered underweight.  A BMI of 18.5 to 24.9 is normal.  A BMI of 25 to 29.9 is considered overweight.  A BMI of 30 and above is considered obese.  Maintain normal blood lipids and cholesterol levels by exercising and minimizing your intake of saturated fat. Eat a balanced diet with plenty of fruit and vegetables. Blood tests for lipids and cholesterol should begin at age 72 and be repeated every 5 years. If your lipid or cholesterol levels are high, you are over 50, or you are at high risk for heart disease, you may need your cholesterol levels checked more frequently.Ongoing high lipid and cholesterol levels should be treated with medicines if diet and exercise are not working.  If you smoke, find out from your health care provider how to quit. If you do not use tobacco, do not start.  Lung cancer screening is recommended for adults aged 53-80 years who are at high risk for developing lung cancer because of a history of smoking. A yearly low-dose CT scan of the lungs is recommended for people who have at least a 30-pack-year history of smoking and are a current smoker or have quit within the past 15 years. A pack year of smoking is smoking an average of 1 pack of cigarettes a day for 1 year (for example: 1 pack a day for 30 years or 2 packs a day for 15 years). Yearly screening should continue until the smoker has stopped smoking for at  least 15 years. Yearly screening should be stopped for people who develop a health problem that would prevent them from having lung cancer treatment.  If you are pregnant, do not drink alcohol. If you are breastfeeding, be very cautious about drinking alcohol. If you are not pregnant and choose to drink alcohol, do not have more than 1 drink per day. One drink is considered to be 12 ounces (355 mL) of beer, 5 ounces (148 mL) of wine, or 1.5 ounces (44 mL) of liquor.  Avoid use of  street drugs. Do not share needles with anyone. Ask for help if you need support or instructions about stopping the use of drugs.  High blood pressure causes heart disease and increases the risk of stroke. Your blood pressure should be checked at least every 1 to 2 years. Ongoing high blood pressure should be treated with medicines if weight loss and exercise do not work.  If you are 26-55 years old, ask your health care provider if you should take aspirin to prevent strokes.  Diabetes screening is done by taking a blood sample to check your blood glucose level after you have not eaten for a certain period of time (fasting). If you are not overweight and you do not have risk factors for diabetes, you should be screened once every 3 years starting at age 19. If you are overweight or obese and you are 4-35 years of age, you should be screened for diabetes every year as part of your cardiovascular risk assessment.  Breast cancer screening is essential preventive care for women. You should practice "breast self-awareness." This means understanding the normal appearance and feel of your breasts and may include breast self-examination. Any changes detected, no matter how small, should be reported to a health care provider. Women in their 44s and 30s should have a clinical breast exam (CBE) by a health care provider as part of a regular health exam every 1 to 3 years. After age 52, women should have a CBE every year. Starting at age 78, women should consider having a mammogram (breast X-ray test) every year. Women who have a family history of breast cancer should talk to their health care provider about genetic screening. Women at a high risk of breast cancer should talk to their health care providers about having an MRI and a mammogram every year.  Breast cancer gene (BRCA)-related cancer risk assessment is recommended for women who have family members with BRCA-related cancers. BRCA-related cancers include  breast, ovarian, tubal, and peritoneal cancers. Having family members with these cancers may be associated with an increased risk for harmful changes (mutations) in the breast cancer genes BRCA1 and BRCA2. Results of the assessment will determine the need for genetic counseling and BRCA1 and BRCA2 testing.  Your health care provider may recommend that you be screened regularly for cancer of the pelvic organs (ovaries, uterus, and vagina). This screening involves a pelvic examination, including checking for microscopic changes to the surface of your cervix (Pap test). You may be encouraged to have this screening done every 3 years, beginning at age 72.  For women ages 46-65, health care providers may recommend pelvic exams and Pap testing every 3 years, or they may recommend the Pap and pelvic exam, combined with testing for human papilloma virus (HPV), every 5 years. Some types of HPV increase your risk of cervical cancer. Testing for HPV may also be done on women of any age with unclear Pap test results.  Other health care  providers may not recommend any screening for nonpregnant women who are considered low risk for pelvic cancer and who do not have symptoms. Ask your health care provider if a screening pelvic exam is right for you.  If you have had past treatment for cervical cancer or a condition that could lead to cancer, you need Pap tests and screening for cancer for at least 20 years after your treatment. If Pap tests have been discontinued, your risk factors (such as having a new sexual partner) need to be reassessed to determine if screening should resume. Some women have medical problems that increase the chance of getting cervical cancer. In these cases, your health care provider may recommend more frequent screening and Pap tests.  Colorectal cancer can be detected and often prevented. Most routine colorectal cancer screening begins at the age of 19 years and continues through age 80 years.  However, your health care provider may recommend screening at an earlier age if you have risk factors for colon cancer. On a yearly basis, your health care provider may provide home test kits to check for hidden blood in the stool. Use of a small camera at the end of a tube, to directly examine the colon (sigmoidoscopy or colonoscopy), can detect the earliest forms of colorectal cancer. Talk to your health care provider about this at age 63, when routine screening begins. Direct exam of the colon should be repeated every 5-10 years through age 65 years, unless early forms of precancerous polyps or small growths are found.  People who are at an increased risk for hepatitis B should be screened for this virus. You are considered at high risk for hepatitis B if:  You were born in a country where hepatitis B occurs often. Talk with your health care provider about which countries are considered high risk.  Your parents were born in a high-risk country and you have not received a shot to protect against hepatitis B (hepatitis B vaccine).  You have HIV or AIDS.  You use needles to inject street drugs.  You live with, or have sex with, someone who has hepatitis B.  You get hemodialysis treatment.  You take certain medicines for conditions like cancer, organ transplantation, and autoimmune conditions.  Hepatitis C blood testing is recommended for all people born from [birth year] through 1965 and any individual with known risks for hepatitis C.  Practice safe sex. Use condoms and avoid high-risk sexual practices to reduce the spread of sexually transmitted infections (STIs). STIs include gonorrhea, chlamydia, syphilis, trichomonas, herpes, HPV, and human immunodeficiency virus (HIV). Herpes, HIV, and HPV are viral illnesses that have no cure. They can result in disability, cancer, and death.  You should be screened for sexually transmitted illnesses (STIs) including gonorrhea and chlamydia if:  You are  sexually active and are younger than 24 years.  You are older than 24 years and your health care provider tells you that you are at risk for this type of infection.  Your sexual activity has changed since you were last screened and you are at an increased risk for chlamydia or gonorrhea. Ask your health care provider if you are at risk.  If you are at risk of being infected with HIV, it is recommended that you take a prescription medicine daily to prevent HIV infection. This is called preexposure prophylaxis (PrEP). You are considered at risk if:  You are sexually active and do not regularly use condoms or know the HIV status of your partner(s).  You take drugs by injection.  You are sexually active with a partner who has HIV.  Talk with your health care provider about whether you are at high risk of being infected with HIV. If you choose to begin PrEP, you should first be tested for HIV. You should then be tested every 3 months for as long as you are taking PrEP.  Osteoporosis is a disease in which the bones lose minerals and strength with aging. This can result in serious bone fractures or breaks. The risk of osteoporosis can be identified using a bone density scan. Women ages 48 years and over and women at risk for fractures or osteoporosis should discuss screening with their health care providers. Ask your health care provider whether you should take a calcium supplement or vitamin D to reduce the rate of osteoporosis.  Menopause can be associated with physical symptoms and risks. Hormone replacement therapy is available to decrease symptoms and risks. You should talk to your health care provider about whether hormone replacement therapy is right for you.  Use sunscreen. Apply sunscreen liberally and repeatedly throughout the day. You should seek shade when your shadow is shorter than you. Protect yourself by wearing long sleeves, pants, a wide-brimmed hat, and sunglasses year round, whenever  you are outdoors.  Once a month, do a whole body skin exam, using a mirror to look at the skin on your back. Tell your health care provider of new moles, moles that have irregular borders, moles that are larger than a pencil eraser, or moles that have changed in shape or color.  Stay current with required vaccines (immunizations).  Influenza vaccine. All adults should be immunized every year.  Tetanus, diphtheria, and acellular pertussis (Td, Tdap) vaccine. Pregnant women should receive 1 dose of Tdap vaccine during each pregnancy. The dose should be obtained regardless of the length of time since the last dose. Immunization is preferred during the 27th-36th week of gestation. An adult who has not previously received Tdap or who does not know her vaccine status should receive 1 dose of Tdap. This initial dose should be followed by tetanus and diphtheria toxoids (Td) booster doses every 10 years. Adults with an unknown or incomplete history of completing a 3-dose immunization series with Td-containing vaccines should begin or complete a primary immunization series including a Tdap dose. Adults should receive a Td booster every 10 years.  Varicella vaccine. An adult without evidence of immunity to varicella should receive 2 doses or a second dose if she has previously received 1 dose. Pregnant females who do not have evidence of immunity should receive the first dose after pregnancy. This first dose should be obtained before leaving the health care facility. The second dose should be obtained 4-8 weeks after the first dose.  Human papillomavirus (HPV) vaccine. Females aged 13-26 years who have not received the vaccine previously should obtain the 3-dose series. The vaccine is not recommended for use in pregnant females. However, pregnancy testing is not needed before receiving a dose. If a female is found to be pregnant after receiving a dose, no treatment is needed. In that case, the remaining doses  should be delayed until after the pregnancy. Immunization is recommended for any person with an immunocompromised condition through the age of 74 years if she did not get any or all doses earlier. During the 3-dose series, the second dose should be obtained 4-8 weeks after the first dose. The third dose should be obtained 24 weeks after the first  dose and 16 weeks after the second dose.  Zoster vaccine. One dose is recommended for adults aged 36 years or older unless certain conditions are present.  Measles, mumps, and rubella (MMR) vaccine. Adults born before [birth year] generally are considered immune to measles and mumps. Adults born in [birth year] or later should have 1 or more doses of MMR vaccine unless there is a contraindication to the vaccine or there is laboratory evidence of immunity to each of the three diseases. A routine second dose of MMR vaccine should be obtained at least 28 days after the first dose for students attending postsecondary schools, health care workers, or international travelers. People who received inactivated measles vaccine or an unknown type of measles vaccine during 1963-1967 should receive 2 doses of MMR vaccine. People who received inactivated mumps vaccine or an unknown type of mumps vaccine before 1979 and are at high risk for mumps infection should consider immunization with 2 doses of MMR vaccine. For females of childbearing age, rubella immunity should be determined. If there is no evidence of immunity, females who are not pregnant should be vaccinated. If there is no evidence of immunity, females who are pregnant should delay immunization until after pregnancy. Unvaccinated health care workers born before 65 who lack laboratory evidence of measles, mumps, or rubella immunity or laboratory confirmation of disease should consider measles and mumps immunization with 2 doses of MMR vaccine or rubella immunization with 1 dose of MMR vaccine.  Pneumococcal 13-valent conjugate (PCV13)  vaccine. When indicated, a person who is uncertain of his immunization history and has no record of immunization should receive the PCV13 vaccine. All adults 8 years of age and older should receive this vaccine. An adult aged 35 years or older who has certain medical conditions and has not been previously immunized should receive 1 dose of PCV13 vaccine. This PCV13 should be followed with a dose of pneumococcal polysaccharide (PPSV23) vaccine. Adults who are at high risk for pneumococcal disease should obtain the PPSV23 vaccine at least 8 weeks after the dose of PCV13 vaccine. Adults older than 40 years of age who have normal immune system function should obtain the PPSV23 vaccine dose at least 1 year after the dose of PCV13 vaccine.  Pneumococcal polysaccharide (PPSV23) vaccine. When PCV13 is also indicated, PCV13 should be obtained first. All adults aged 67 years and older should be immunized. An adult younger than age 10 years who has certain medical conditions should be immunized. Any person who resides in a nursing home or long-term care facility should be immunized. An adult smoker should be immunized. People with an immunocompromised condition and certain other conditions should receive both PCV13 and PPSV23 vaccines. People with human immunodeficiency virus (HIV) infection should be immunized as soon as possible after diagnosis. Immunization during chemotherapy or radiation therapy should be avoided. Routine use of PPSV23 vaccine is not recommended for American Indians, [place]atives, or people younger than 65 years unless there are medical conditions that require PPSV23 vaccine. When indicated, people who have unknown immunization and have no record of immunization should receive PPSV23 vaccine. One-time revaccination 5 years after the first dose of PPSV23 is recommended for people aged 19-64 years who have chronic kidney failure, nephrotic syndrome, asplenia, or immunocompromised conditions. People who  received 1-2 doses of PPSV23 before age 54 years should receive another dose of PPSV23 vaccine at age 4 years or later if at least 5 years have passed since the previous dose. Doses of PPSV23 are not needed for  people immunized with PPSV23 at or after age 55 years.  Meningococcal vaccine. Adults with asplenia or persistent complement component deficiencies should receive 2 doses of quadrivalent meningococcal conjugate (MenACWY-D) vaccine. The doses should be obtained at least 2 months apart. Microbiologists working with certain meningococcal bacteria, [employer] recruits, people at risk during an outbreak, and people who travel to or live in countries with a high rate of meningitis should be immunized. A first-year college student up through age 63 years who is living in a residence hall should receive a dose if she did not receive a dose on or after her 16th birthday. Adults who have certain high-risk conditions should receive one or more doses of vaccine.  Hepatitis A vaccine. Adults who wish to be protected from this disease, have certain high-risk conditions, work with hepatitis A-infected animals, work in hepatitis A research labs, or travel to or work in countries with a high rate of hepatitis A should be immunized. Adults who were previously unvaccinated and who anticipate close contact with an international adoptee during the first 60 days after arrival in the [place] from a country with a high rate of hepatitis A should be immunized.  Hepatitis B vaccine. Adults who wish to be protected from this disease, have certain high-risk conditions, may be exposed to blood or other infectious body fluids, are household contacts or sex partners of hepatitis B positive people, are clients or workers in certain care facilities, or travel to or work in countries with a high rate of hepatitis B should be immunized.  Haemophilus influenzae type b (Hib) vaccine. A previously unvaccinated person with asplenia or  sickle cell disease or having a scheduled splenectomy should receive 1 dose of Hib vaccine. Regardless of previous immunization, a recipient of a hematopoietic stem cell transplant should receive a 3-dose series 6-12 months after her successful transplant. Hib vaccine is not recommended for adults with HIV infection. Preventive Services / Frequency Ages 54 to 53 years  Blood pressure check.** / Every 3-5 years.  Lipid and cholesterol check.** / Every 5 years beginning at age 23.  Clinical breast exam.** / Every 3 years for women in their 7s and 70s.  BRCA-related cancer risk assessment.** / For women who have family members with a BRCA-related cancer (breast, ovarian, tubal, or peritoneal cancers).  Pap test.** / Every 2 years from ages 21 through 39. Every 3 years starting at age 2 through age 44 or 88 with a history of 3 consecutive normal Pap tests.  HPV screening.** / Every 3 years from ages 31 through ages 9 to 25 with a history of 3 consecutive normal Pap tests.  Hepatitis C blood test.** / For any individual with known risks for hepatitis C.  Skin self-exam. / Monthly.  Influenza vaccine. / Every year.  Tetanus, diphtheria, and acellular pertussis (Tdap, Td) vaccine.** / Consult your health care provider. Pregnant women should receive 1 dose of Tdap vaccine during each pregnancy. 1 dose of Td every 10 years.  Varicella vaccine.** / Consult your health care provider. Pregnant females who do not have evidence of immunity should receive the first dose after pregnancy.  HPV vaccine. / 3 doses over 6 months, if 22 and younger. The vaccine is not recommended for use in pregnant females. However, pregnancy testing is not needed before receiving a dose.  Measles, mumps, rubella (MMR) vaccine.** / You need at least 1 dose of MMR if you were born in 1957 or later. You may also need a 2nd  dose. For females of childbearing age, rubella immunity should be determined. If there is no evidence  of immunity, females who are not pregnant should be vaccinated. If there is no evidence of immunity, females who are pregnant should delay immunization until after pregnancy.  Pneumococcal 13-valent conjugate (PCV13) vaccine.** / Consult your health care provider.  Pneumococcal polysaccharide (PPSV23) vaccine.** / 1 to 2 doses if you smoke cigarettes or if you have certain conditions.  Meningococcal vaccine.** / 1 dose if you are age 44 to 63 years and a Market researcher living in a residence hall, or have one of several medical conditions, you need to get vaccinated against meningococcal disease. You may also need additional booster doses.  Hepatitis A vaccine.** / Consult your health care provider.  Hepatitis B vaccine.** / Consult your health care provider.  Haemophilus influenzae type b (Hib) vaccine.** / Consult your health care provider. Ages 66 to 61 years  Blood pressure check.** / Every year.  Lipid and cholesterol check.** / Every 5 years beginning at age 17 years.  Lung cancer screening. / Every year if you are aged 47-80 years and have a 30-pack-year history of smoking and currently smoke or have quit within the past 15 years. Yearly screening is stopped once you have quit smoking for at least 15 years or develop a health problem that would prevent you from having lung cancer treatment.  Clinical breast exam.** / Every year after age 44 years.  BRCA-related cancer risk assessment.** / For women who have family members with a BRCA-related cancer (breast, ovarian, tubal, or peritoneal cancers).  Mammogram.** / Every year beginning at age 9 years and continuing for as long as you are in good health. Consult with your health care provider.  Pap test.** / Every 3 years starting at age 48 years through age 77 or 63 years with a history of 3 consecutive normal Pap tests.  HPV screening.** / Every 3 years from ages 28 years through ages 85 to 68 years with a history of 3  consecutive normal Pap tests.  Fecal occult blood test (FOBT) of stool. / Every year beginning at age 37 years and continuing until age 68 years. You may not need to do this test if you get a colonoscopy every 10 years.  Flexible sigmoidoscopy or colonoscopy.** / Every 5 years for a flexible sigmoidoscopy or every 10 years for a colonoscopy beginning at age 27 years and continuing until age 75 years.  Hepatitis C blood test.** / For all people born from [birth year] through 1965 and any individual with known risks for hepatitis C.  Skin self-exam. / Monthly.  Influenza vaccine. / Every year.  Tetanus, diphtheria, and acellular pertussis (Tdap/Td) vaccine.** / Consult your health care provider. Pregnant women should receive 1 dose of Tdap vaccine during each pregnancy. 1 dose of Td every 10 years.  Varicella vaccine.** / Consult your health care provider. Pregnant females who do not have evidence of immunity should receive the first dose after pregnancy.  Zoster vaccine.** / 1 dose for adults aged 46 years or older.  Measles, mumps, rubella (MMR) vaccine.** / You need at least 1 dose of MMR if you were born in 1957 or later. You may also need a second dose. For females of childbearing age, rubella immunity should be determined. If there is no evidence of immunity, females who are not pregnant should be vaccinated. If there is no evidence of immunity, females who are pregnant should delay immunization until after pregnancy.  Pneumococcal 13-valent conjugate (PCV13) vaccine.** / Consult your health care provider.  Pneumococcal polysaccharide (PPSV23) vaccine.** / 1 to 2 doses if you smoke cigarettes or if you have certain conditions.  Meningococcal vaccine.** / Consult your health care provider.  Hepatitis A vaccine.** / Consult your health care provider.  Hepatitis B vaccine.** / Consult your health care provider.  Haemophilus influenzae type b (Hib) vaccine.** / Consult your health care  provider. Ages 56 years and over  Blood pressure check.** / Every year.  Lipid and cholesterol check.** / Every 5 years beginning at age 36 years.  Lung cancer screening. / Every year if you are aged 1-80 years and have a 30-pack-year history of smoking and currently smoke or have quit within the past 15 years. Yearly screening is stopped once you have quit smoking for at least 15 years or develop a health problem that would prevent you from having lung cancer treatment.  Clinical breast exam.** / Every year after age 37 years.  BRCA-related cancer risk assessment.** / For women who have family members with a BRCA-related cancer (breast, ovarian, tubal, or peritoneal cancers).  Mammogram.** / Every year beginning at age 15 years and continuing for as long as you are in good health. Consult with your health care provider.  Pap test.** / Every 3 years starting at age 98 years through age 21 or 61 years with 3 consecutive normal Pap tests. Testing can be stopped between 65 and 70 years with 3 consecutive normal Pap tests and no abnormal Pap or HPV tests in the past 10 years.  HPV screening.** / Every 3 years from ages 49 years through ages 70 or 32 years with a history of 3 consecutive normal Pap tests. Testing can be stopped between 65 and 70 years with 3 consecutive normal Pap tests and no abnormal Pap or HPV tests in the past 10 years.  Fecal occult blood test (FOBT) of stool. / Every year beginning at age 2 years and continuing until age 13 years. You may not need to do this test if you get a colonoscopy every 10 years.  Flexible sigmoidoscopy or colonoscopy.** / Every 5 years for a flexible sigmoidoscopy or every 10 years for a colonoscopy beginning at age 66 years and continuing until age 73 years.  Hepatitis C blood test.** / For all people born from [birth year] through 1965 and any individual with known risks for hepatitis C.  Osteoporosis screening.** / A one-time screening for women ages  31 years and over and women at risk for fractures or osteoporosis.  Skin self-exam. / Monthly.  Influenza vaccine. / Every year.  Tetanus, diphtheria, and acellular pertussis (Tdap/Td) vaccine.** / 1 dose of Td every 10 years.  Varicella vaccine.** / Consult your health care provider.  Zoster vaccine.** / 1 dose for adults aged 35 years or older.  Pneumococcal 13-valent conjugate (PCV13) vaccine.** / Consult your health care provider.  Pneumococcal polysaccharide (PPSV23) vaccine.** / 1 dose for all adults aged 98 years and older.  Meningococcal vaccine.** / Consult your health care provider.  Hepatitis A vaccine.** / Consult your health care provider.  Hepatitis B vaccine.** / Consult your health care provider.  Haemophilus influenzae type b (Hib) vaccine.** / Consult your health care provider. ** Family history and personal history of risk and conditions may change your health care provider's recommendations.   This information is not intended to replace advice given to you by your health care provider. Make sure you discuss any questions you have  with your health care provider.   Document Released: 11/19/2001 Document Revised: 10/14/2014 Document Reviewed: 02/18/2011 Elsevier Interactive Patient Education [employer].

## 2015-11-24 NOTE — Assessment & Plan Note (Signed)
Doing very well. Continue current medication regimen.  

## 2015-11-27 ENCOUNTER — Telehealth: Payer: Self-pay | Admitting: *Deleted

## 2015-11-27 DIAGNOSIS — R8290 Unspecified abnormal findings in urine: Secondary | ICD-10-CM

## 2015-11-27 NOTE — Telephone Encounter (Signed)
Called and spoke with the pt and informed her of recent lab results and note.  Pt verbalized understanding and agreed.  Pt was scheduled a lab appt for (Friday-01/05/16 @ 7:15am).  Future lab ordered and sent.//AB/CMA

## 2015-11-27 NOTE — Telephone Encounter (Signed)
Called and [facility] @ 4;37pm @ [phone]) informing the pt that Selena Batten stated that it was only a trace of ketones not anything worrisome, but likely due to mild dehydration from fasting for labs.  Informed the pt that it's her decision if she would like to change the lab appt to a sooner appt.  The 31th will be okay.  Its at her earliest convenience.  Asked the pt to give [place] a call back if she would like to change the lab appt time.//AB/CMA

## 2015-11-27 NOTE — Telephone Encounter (Signed)
Pt says that she was told to schedule a lab appt. Pt has scheduled but would like to know if PCP need labs completed sooner than scheduled date of 3/31?

## 2015-11-27 NOTE — Telephone Encounter (Signed)
-----   Message from Waldon Merl, PA-C sent at 11/26/2015  8:01 PM EST ----- Labs look great overall. Her anemia is very mild and stable. Will continue her B12 injections. Urine with trace ketones which is not a typical finding. Do not feel this is anything worrisome, likely due to mild dehydration from fasting for labs. Would like her to return to the lab for repeat UA at her earliest convenience just to make sure all is good.

## 2015-11-28 ENCOUNTER — Other Ambulatory Visit: Payer: Self-pay

## 2015-11-28 MED ORDER — INTEGRA PLUS PO CAPS: ORAL_CAPSULE | ORAL | Status: DC

## 2015-11-28 MED FILL — FOLIVANE-PLUS CAPSULE: 30 days supply | Qty: 30 | Fill #0

## 2015-12-08 ENCOUNTER — Telehealth: Payer: Self-pay | Admitting: Physician Assistant

## 2015-12-08 NOTE — Telephone Encounter (Signed)
That means she scheduled an appointment for Monday.

## 2015-12-08 NOTE — Telephone Encounter (Signed)
°  Relation to ZO:XWRUpt:self Call back number: [phone] Pharmacy:  Reason for call:  Patient sent a message stating the following:    Appointment For: MONROE-THOMPSON,Meda ([id number])    Visit Type: MYCHART OFFICE VISIT (1064)      12/11/2015   7:00 AM 15 mins. Waldon MerlWilliam C Martin, PA-C  [id number]      Patient Comments:   Office Visit   Vaginal irritation after meds finished.

## 2015-12-11 ENCOUNTER — Ambulatory Visit: Payer: Self-pay | Admitting: Physician Assistant

## 2015-12-20 DIAGNOSIS — N771 Vaginitis, vulvitis and vulvovaginitis in diseases classified elsewhere: Secondary | ICD-10-CM | POA: Diagnosis not present

## 2015-12-20 DIAGNOSIS — N7689 Other specified inflammation of vagina and vulva: Secondary | ICD-10-CM | POA: Diagnosis not present

## 2015-12-22 ENCOUNTER — Other Ambulatory Visit: Payer: Self-pay | Admitting: Physician Assistant

## 2015-12-22 ENCOUNTER — Telehealth: Payer: Self-pay | Admitting: Physician Assistant

## 2015-12-22 MED FILL — OMEPRAZOLE DR 20 MG CAPSULE: 20 | 30 days supply | Qty: 60 | Fill #1

## 2015-12-22 MED FILL — FOLIVANE-PLUS CAPSULE: 30 days supply | Qty: 30 | Fill #1

## 2015-12-22 MED FILL — AMITRIPTYLINE HCL 10 MG TAB: 10 | 30 days supply | Qty: 30 | Fill #1

## 2015-12-22 NOTE — Telephone Encounter (Signed)
Pt called in stating she is out of xanax. RX is marked as "done". Pt needs [facility] [address] bc they close at 5:30pm today.

## 2015-12-22 NOTE — Telephone Encounter (Signed)
Requesting: alprazolam Contract  05/12/2015 UDS  Moderate--due Last OV  11/24/2015 Last Refill   #90 with 0 refills on 11/24/2015  Please Advise

## 2015-12-24 NOTE — Telephone Encounter (Signed)
Sorry did not see this til after hours. Can refill as requested

## 2015-12-25 ENCOUNTER — Other Ambulatory Visit: Payer: Self-pay | Admitting: Physician Assistant

## 2015-12-25 MED ORDER — ALPRAZOLAM 2 MG PO TABS: 1.0000 mg | ORAL_TABLET | Freq: Three times a day (TID) | ORAL | Status: DC | PRN

## 2015-12-25 NOTE — Telephone Encounter (Signed)
Have already handled this.

## 2015-12-25 NOTE — Telephone Encounter (Signed)
Completed.

## 2015-12-26 ENCOUNTER — Telehealth: Payer: Self-pay | Admitting: Physician Assistant

## 2015-12-26 DIAGNOSIS — Z8639 Personal history of other endocrine, nutritional and metabolic disease: Secondary | ICD-10-CM

## 2015-12-26 DIAGNOSIS — E559 Vitamin D deficiency, unspecified: Secondary | ICD-10-CM

## 2015-12-26 NOTE — Telephone Encounter (Signed)
Relation to pt: self Call back number:[phone]   Reason for call:  patient called to confirm upcoming lab appointment for 01/05/16 and would like to know why she's repeating UA patient states she was unaware of intial UA results in addition would like her Viatmin D and B checked as well. Please advise

## 2015-12-26 NOTE — Telephone Encounter (Signed)
Called and spoke with the pt and informed her again of her recent lab results and note.  Pt again verbalized understanding.  She stated again that she would like to have her Vit. D and B levels drawn when she come in for the repeat UA.  [facility] from the pt and he okay the request for the Vit D and B level to be drawn.  Future labs ordered and sent.//AB/CMA

## 2016-01-05 ENCOUNTER — Other Ambulatory Visit: Payer: 59

## 2016-01-10 DIAGNOSIS — R8271 Bacteriuria: Secondary | ICD-10-CM | POA: Diagnosis not present

## 2016-01-10 DIAGNOSIS — N760 Acute vaginitis: Secondary | ICD-10-CM | POA: Diagnosis not present

## 2016-01-12 ENCOUNTER — Other Ambulatory Visit: Payer: 59

## 2016-01-16 ENCOUNTER — Other Ambulatory Visit (INDEPENDENT_AMBULATORY_CARE_PROVIDER_SITE_OTHER): Payer: 59

## 2016-01-16 DIAGNOSIS — R8290 Unspecified abnormal findings in urine: Secondary | ICD-10-CM | POA: Diagnosis not present

## 2016-01-16 DIAGNOSIS — Z8639 Personal history of other endocrine, nutritional and metabolic disease: Secondary | ICD-10-CM | POA: Diagnosis not present

## 2016-01-16 DIAGNOSIS — E559 Vitamin D deficiency, unspecified: Secondary | ICD-10-CM

## 2016-01-16 LAB — URINALYSIS, ROUTINE W REFLEX MICROSCOPIC
BILIRUBIN URINE: NEGATIVE
Hgb urine dipstick: NEGATIVE
Ketones, ur: NEGATIVE
Leukocytes, UA: NEGATIVE
NITRITE: NEGATIVE
PH: 6.5 (ref 5.0–8.0)
SPECIFIC GRAVITY, URINE: 1.015 (ref 1.000–1.030)
TOTAL PROTEIN, URINE-UPE24: NEGATIVE
Urine Glucose: NEGATIVE
Urobilinogen, UA: 0.2 (ref 0.0–1.0)

## 2016-01-16 LAB — VITAMIN B12: Vitamin B-12: 179 pg/mL — ABNORMAL LOW (ref 211–911)

## 2016-01-16 LAB — VITAMIN D 25 HYDROXY (VIT D DEFICIENCY, FRACTURES): VITD: 22.31 ng/mL — ABNORMAL LOW (ref 30.00–100.00)

## 2016-01-17 ENCOUNTER — Telehealth: Payer: Self-pay | Admitting: *Deleted

## 2016-01-17 DIAGNOSIS — E538 Deficiency of other specified B group vitamins: Secondary | ICD-10-CM

## 2016-01-17 DIAGNOSIS — N390 Urinary tract infection, site not specified: Secondary | ICD-10-CM

## 2016-01-17 MED ORDER — VITAMIN D (ERGOCALCIFEROL) 1.25 MG (50000 UNIT) PO CAPS: ORAL_CAPSULE | ORAL | Status: DC

## 2016-01-17 NOTE — Telephone Encounter (Signed)
Level is low so I would recommend a repeat injection. We can try to recheck in 1 month after injection to assess if level stays normal or returns to a low level. This would help us verify how often to dose it.

## 2016-01-17 NOTE — Telephone Encounter (Signed)
-----   Message from Waldon MerlWilliam C Martin, PA-C sent at 01/16/2016  9:21 PM EDT ----- Repeat urine looks great. Vitamin D remains low. Recommend ERgocalciferol 50,000 units once weekly for 10 weeks, then once monthly along with her daily OTC Vitamin D. B12 is low -- will need to continue b12 injections monthly.

## 2016-01-17 NOTE — Telephone Encounter (Signed)
Called and spoke with the pt and informed her of recent lab results and note.  Pt verbalized understanding and agreed.  New prescription sent to the pharmacy by e-script.  Pt stated that she was only taking her Vitamin B12 injection every 6 months.  She wants to verify having to take the injection monthly.  Please advise.//AB/CMA

## 2016-01-18 MED FILL — VIT D2 1.25 MG (50,000 UNIT: 1.25 MG | 70 days supply | Qty: 10 | Fill #0

## 2016-01-22 ENCOUNTER — Other Ambulatory Visit: Payer: Self-pay | Admitting: Physician Assistant

## 2016-01-22 MED FILL — ALPRAZolam 2 MG TABS: 2 | 60 days supply | Qty: 90 | Fill #0

## 2016-01-22 NOTE — Telephone Encounter (Signed)
Patient informed of all instructions.

## 2016-01-22 NOTE — Telephone Encounter (Signed)
I would say ok to give injection and repeat labs in 1 month.

## 2016-01-22 NOTE — Telephone Encounter (Signed)
Spoke to the patient and she is not due until August for her next B12 injection.  She wanted to know if ok to go ahead and schedule nurse visit now to have B12 injection or do you want her to wait? Once scheduled she understands to recheck B12 levels a month after injection

## 2016-01-22 NOTE — Telephone Encounter (Signed)
Scheduled appointment for her B12 injection for this coming Friday. Patient states she was seen at an Urgent Care and diagnosed with a UTI.  Once she completes the antibiotic would like to come here to have her urine rechecked to make sure urine is clear.  Advise please.

## 2016-01-22 NOTE — Telephone Encounter (Signed)
Pt requesting refills of Omeprazole 20 mg BID. Last seen 11-20-15 for endo. No follow up visit at this time. Please advise on refills.

## 2016-01-22 NOTE — Telephone Encounter (Signed)
She can come in for a UA once antibiotic is completed. Ok to place order. She will need to call up here to schedule with the lab if she is not coming in to see me.

## 2016-01-22 NOTE — Telephone Encounter (Signed)
Okay to refill however are her symptoms well controlled on this regimen? Given gastric bypass status her capsule should be opened prior to ingestion. If symptoms persist I would like to see her back in clinic. Thanks

## 2016-01-23 MED FILL — OMEPRAZOLE DR 20 MG CAPSULE: 20 | 30 days supply | Qty: 60 | Fill #0 | Status: TO

## 2016-01-23 NOTE — Telephone Encounter (Signed)
Pt contacted. She states the omeprazole twice a day is working much better. Pt informed to call and make a follow up if symptoms become worse.

## 2016-01-26 ENCOUNTER — Ambulatory Visit (INDEPENDENT_AMBULATORY_CARE_PROVIDER_SITE_OTHER): Payer: 59 | Admitting: Behavioral Health

## 2016-01-26 ENCOUNTER — Other Ambulatory Visit (INDEPENDENT_AMBULATORY_CARE_PROVIDER_SITE_OTHER): Payer: 59

## 2016-01-26 DIAGNOSIS — N390 Urinary tract infection, site not specified: Secondary | ICD-10-CM

## 2016-01-26 DIAGNOSIS — E538 Deficiency of other specified B group vitamins: Secondary | ICD-10-CM

## 2016-01-26 MED ORDER — CYANOCOBALAMIN 1000 MCG/ML IJ SOLN
1000.0000 ug | Freq: Once | INTRAMUSCULAR | Status: AC
  Administered 2016-01-26: 1000 ug via INTRAMUSCULAR

## 2016-01-26 NOTE — Progress Notes (Signed)
Pre visit review using our clinic review tool, if applicable. No additional management support is needed unless otherwise documented below in the visit note.  Patient came in office today for B12 injection. IM given in Right Deltoid. Patient tolerated injection well. Next appointment scheduled for 02/23/16 at 4:00 PM.

## 2016-01-27 LAB — URINALYSIS
BILIRUBIN URINE: NEGATIVE
GLUCOSE, UA: NEGATIVE
HGB URINE DIPSTICK: NEGATIVE
Leukocytes, UA: NEGATIVE
Nitrite: NEGATIVE
PH: 5.5 (ref 5.0–8.0)
Specific Gravity, Urine: 1.038 — ABNORMAL HIGH (ref 1.001–1.035)

## 2016-01-30 MED FILL — FLUCONAZOLE 150 MG TABLET: 150 | 1 days supply | Qty: 1 | Fill #0

## 2016-02-05 ENCOUNTER — Ambulatory Visit: Payer: 59 | Admitting: Physician Assistant

## 2016-02-07 DIAGNOSIS — M791 Myalgia: Secondary | ICD-10-CM | POA: Diagnosis not present

## 2016-02-07 DIAGNOSIS — M542 Cervicalgia: Secondary | ICD-10-CM | POA: Diagnosis not present

## 2016-02-07 DIAGNOSIS — G894 Chronic pain syndrome: Secondary | ICD-10-CM | POA: Diagnosis not present

## 2016-02-07 DIAGNOSIS — Z79891 Long term (current) use of opiate analgesic: Secondary | ICD-10-CM | POA: Diagnosis not present

## 2016-02-09 ENCOUNTER — Encounter: Payer: Self-pay | Admitting: Physician Assistant

## 2016-02-12 NOTE — Telephone Encounter (Signed)
Called and spoke with the pt and informed her per Selena BattenCody that it will be to early to have the Vit B12 on Tues.  It will not be 4 weeks.   Informed the pt that she can change her follow-up appt to 5/19.  Pt verbalized understanding and agreed to change the follow-up appt date.//AB/CMA

## 2016-02-13 ENCOUNTER — Encounter: Payer: Self-pay | Admitting: Physician Assistant

## 2016-02-13 ENCOUNTER — Ambulatory Visit (INDEPENDENT_AMBULATORY_CARE_PROVIDER_SITE_OTHER): Payer: 59 | Admitting: Physician Assistant

## 2016-02-13 ENCOUNTER — Other Ambulatory Visit (HOSPITAL_COMMUNITY)
Admission: RE | Admit: 2016-02-13 | Discharge: 2016-02-13 | Disposition: A | Discharge status: Home / Self Care | Payer: 59 | Source: Ambulatory Visit | Attending: Physician Assistant | Admitting: Physician Assistant

## 2016-02-13 VITALS — BP 132/80 | HR 86 | Temp 97.9°F | Resp 16 | Ht 66.0 in | Wt 260.4 lb

## 2016-02-13 DIAGNOSIS — N898 Other specified noninflammatory disorders of vagina: Secondary | ICD-10-CM | POA: Diagnosis not present

## 2016-02-13 DIAGNOSIS — N760 Acute vaginitis: Secondary | ICD-10-CM | POA: Diagnosis not present

## 2016-02-13 DIAGNOSIS — F39 Unspecified mood [affective] disorder: Secondary | ICD-10-CM | POA: Diagnosis not present

## 2016-02-13 DIAGNOSIS — Z113 Encounter for screening for infections with a predominantly sexual mode of transmission: Secondary | ICD-10-CM | POA: Diagnosis not present

## 2016-02-13 LAB — POCT URINALYSIS DIPSTICK
Bilirubin, UA: POSITIVE
Blood, UA: NEGATIVE
Glucose, UA: NEGATIVE
Ketones, UA: POSITIVE
Leukocytes, UA: NEGATIVE
NITRITE UA: NEGATIVE
PH UA: 6
PROTEIN UA: POSITIVE
Spec Grav, UA: >=1.03
Urobilinogen, UA: 0.2

## 2016-02-13 MED ORDER — AMITRIPTYLINE HCL 25 MG PO TABS: 25.0000 mg | ORAL_TABLET | Freq: Every day | ORAL | Status: DC

## 2016-02-13 MED FILL — AMITRIPTYLINE HCL 25 MG TAB: 25 | 30 days supply | Qty: 30 | Fill #0

## 2016-02-13 NOTE — Assessment & Plan Note (Signed)
Will increase Elavil to 25 mg nightly. Continue Xanax as directed. FU 3 months.

## 2016-02-13 NOTE — Assessment & Plan Note (Signed)
Endorses recent BV treated with Flagyl and recent UTI. Denies residual dysuria. Notes mild frequency that is chronic for her. Denies vaginal pain or pruritus. Declines exam. Will check UA, Urine culture, Urine ancillary for BV, G/C, yeast and trichomoniasis.

## 2016-02-13 NOTE — Progress Notes (Signed)
Patient presents to clinic today for follow-up of anxiety/depression as well as to give a repeat urine sample for UA due to some abnormal findings noted (trace ketones and protein).  Endorses urinary frequency at nighttime. Endorses some dysuria and vaginal discharge with odor. Endorses recent diagnosis of bacterial vaginitis and UTI by GYN. Has just completed course of Flagyl and Diflucan. Endorses some mild residual discharge. Is sexually active with her husband only. Is requesting urine testing for STI.  Endorses mood is labile even with current use of Elavil 10 mg nightly and Xanax PRN. Is trying to remove herself from stressful situations. Is having some issue with sleep and acute anxiety. Denies SI/HI.   Past Medical History  Diagnosis Date  . Syncope     with ? seizure in 2015, eval with neruo  . Migraine   . GERD (gastroesophageal reflux disease)     hx gastric ulcer and upper GI bleed, ? NSAID induced  . Anxiety and depression     started with PPD  . Allergy   . Hypertension   . Bacterial vaginitis   . Vitamin D deficiency   . Vitamin B12 deficiency   . Chronic neck pain     sees [place] and [facility] for neck strain  . Seizure ([place])     s/p eval with neuro, no medication, no events since    Current Outpatient Prescriptions on File Prior to Visit  Medication Sig Dispense Refill  . alprazolam (XANAX) 2 MG tablet Take 0.5 tablets (1 mg total) by mouth 3 (three) times daily as needed for anxiety. 90 tablet 0  . BIOTIN PO Take by mouth daily.    . Calcium 250 MG CAPS Take by mouth daily.    . cetirizine (ZYRTEC) 10 MG tablet Take 1 tablet (10 mg total) by mouth daily. 30 tablet 3  . Cholecalciferol (VITAMIN D) 2000 UNITS CAPS Take by mouth daily.    Marland Kitchen eletriptan (RELPAX) 40 MG tablet Take 1 tablet (40 mg total) by mouth as needed for migraine or headache. One tablet by mouth at onset of headache. May repeat in 2 hours if headache persists or recurs. 10 tablet 3  .  FeFum-FePoly-FA-B Cmp-C-Biot (INTEGRA PLUS) CAPS 1 PO daily for anemia 30 capsule 3  . fluticasone (FLONASE) 50 MCG/ACT nasal spray Place into both nostrils daily.    . methocarbamol (ROBAXIN) 500 MG tablet Take 500 mg by mouth every 8 (eight) hours as needed for muscle spasms.    Marland Kitchen MONONESSA 0.25-35 MG-MCG tablet TK 1 T PO QD  4  . omeprazole (PRILOSEC) 20 MG capsule TAKE 1 CAPSULE BY MOUTH 2 TIMES DAILY BEFORE A MEAL 60 capsule 11  . ondansetron (ZOFRAN) 8 MG tablet Take by mouth every 8 (eight) hours as needed for nausea or vomiting.    Marland Kitchen oxyCODONE-acetaminophen (PERCOCET) 10-325 MG per tablet Take 1 tablet by mouth every 8 (eight) hours as needed for pain. Take 1 tablet by mouth every 8 hours as needed for severe pain. 15 tablet 0  . vitamin B-12 (CYANOCOBALAMIN) 1000 MCG tablet Take 1,000 mcg by mouth daily.    . Vitamin D, Ergocalciferol, (DRISDOL) 50000 units CAPS capsule Take 1 capsule (50,000 units) by mouth every 7 days for 10 weeks. 10 capsule 0   No current facility-administered medications on file prior to visit.    Allergies  Allergen Reactions  . Ibuprofen     Causes ulcers    Family History  Problem Relation Age of  Onset  . Arthritis Mother   . Aneurysm Father     deceased  . Lung cancer Maternal Aunt   . Hyperlipidemia Mother   . Heart disease Mother   . Diabetes Mother   . Asthma Mother     Social History   Social History  . Marital Status: Married    Spouse Name: N/A  . Number of Children: N/A  . Years of Education: N/A   Social History Main Topics  . Smoking status: Never Smoker   . Smokeless tobacco: Never Used  . Alcohol Use: 0.0 oz/week    0 Standard drinks or equivalent per week     Comment: occasional   . Drug Use: No  . Sexual Activity: Not Asked   Other Topics Concern  . None   Social History Narrative   Review of Systems - See HPI.  All other ROS are negative.  BP 132/80 mmHg  Pulse 86  Temp(Src) 97.9 F (36.6 C) (Oral)  Resp 16   Ht 5' 6"  (1.676 m)  Wt 260 lb 6 oz (118.105 kg)  BMI 42.05 kg/m2  SpO2 100%  LMP 02/07/2016  Physical Exam  Constitutional: She is oriented to person, place, and time and well-developed, well-nourished, and in no distress.  HENT:  Head: Normocephalic and atraumatic.  Cardiovascular: Normal rate, regular rhythm, normal heart sounds and intact distal pulses.   Pulmonary/Chest: Effort normal and breath sounds normal. No respiratory distress. She has no wheezes. She has no rales. She exhibits no tenderness.  Neurological: She is alert and oriented to person, place, and time.  Skin: Skin is warm and dry. No rash noted.  Psychiatric: Her mood appears anxious.  Vitals reviewed.   Recent Results (from the past 2160 hour(s))  CBC with Differential/Platelet     Status: Abnormal   Collection Time: 11/16/15 11:30 AM  Result Value Ref Range   WBC 8.2 4.0 - 10.5 K/uL   RBC 4.89 3.87 - 5.11 Mil/uL   Hemoglobin 12.3 12.0 - 15.0 g/dL   HCT 38.3 36.0 - 46.0 %   MCV 78.4 78.0 - 100.0 fl   MCHC 32.1 30.0 - 36.0 g/dL   RDW 16.9 (H) 11.5 - 15.5 %   Platelets 306.0 150.0 - 400.0 K/uL   Neutrophils Relative % 57.8 43.0 - 77.0 %   Lymphocytes Relative 34.9 12.0 - 46.0 %   Monocytes Relative 6.6 3.0 - 12.0 %   Eosinophils Relative 0.3 0.0 - 5.0 %   Basophils Relative 0.4 0.0 - 3.0 %   Neutro Abs 4.8 1.4 - 7.7 K/uL   Lymphs Abs 2.9 0.7 - 4.0 K/uL   Monocytes Absolute 0.5 0.1 - 1.0 K/uL   Eosinophils Absolute 0.0 0.0 - 0.7 K/uL   Basophils Absolute 0.0 0.0 - 0.1 K/uL  IBC panel     Status: Abnormal   Collection Time: 11/16/15 11:30 AM  Result Value Ref Range   Iron 25 (L) 42 - 145 ug/dL   Transferrin 335.0 212.0 - 360.0 mg/dL   Saturation Ratios 5.3 (L) 20.0 - 50.0 %  Ferritin     Status: Abnormal   Collection Time: 11/16/15 11:30 AM  Result Value Ref Range   Ferritin 5.3 (L) 10.0 - 291.0 ng/mL  HM MAMMOGRAPHY     Status: None   Collection Time: 11/17/15 12:00 AM  Result Value Ref Range   HM  Mammogram      with [facility]; pt. reported; results pending  CBC  Status: Abnormal   Collection Time: 11/24/15  8:09 AM  Result Value Ref Range   WBC 8.5 4.0 - 10.5 K/uL   RBC 4.46 3.87 - 5.11 Mil/uL   Platelets 300.0 150.0 - 400.0 K/uL   Hemoglobin 11.4 (L) 12.0 - 15.0 g/dL   HCT 35.0 (L) 36.0 - 46.0 %   MCV 78.5 78.0 - 100.0 fl   MCHC 32.7 30.0 - 36.0 g/dL   RDW 17.0 (H) 11.5 - 15.5 %  Comp Met (CMET)     Status: None   Collection Time: 11/24/15  8:09 AM  Result Value Ref Range   Sodium 139 135 - 145 mEq/L   Potassium 3.8 3.5 - 5.1 mEq/L   Chloride 108 96 - 112 mEq/L   CO2 23 19 - 32 mEq/L   Glucose, Bld 74 70 - 99 mg/dL   BUN 12 6 - 23 mg/dL   Creatinine, Ser 0.84 0.40 - 1.20 mg/dL   Total Bilirubin 0.3 0.2 - 1.2 mg/dL   Alkaline Phosphatase 72 39 - 117 U/L   AST 16 0 - 37 U/L   ALT 11 0 - 35 U/L   Total Protein 7.1 6.0 - 8.3 g/dL   Albumin 3.9 3.5 - 5.2 g/dL   Calcium 8.9 8.4 - 10.5 mg/dL   GFR 96.53 >60.00 mL/min  TSH     Status: None   Collection Time: 11/24/15  8:09 AM  Result Value Ref Range   TSH 1.08 0.35 - 4.50 uIU/mL  Hemoglobin A1c     Status: None   Collection Time: 11/24/15  8:09 AM  Result Value Ref Range   Hgb A1c MFr Bld 5.2 4.6 - 6.5 %    Comment: Glycemic Control Guidelines for People with Diabetes:Non Diabetic:  <6%Goal of Therapy: <7%Additional Action Suggested:  >8%   Urinalysis, Routine w reflex microscopic     Status: Abnormal   Collection Time: 11/24/15  8:09 AM  Result Value Ref Range   Color, Urine YELLOW Yellow;Lt. Yellow   APPearance CLEAR Clear   Specific Gravity, Urine 1.025 1.000-1.030   pH 6.0 5.0 - 8.0   Total Protein, Urine TRACE (A) Negative   Urine Glucose NEGATIVE Negative   Ketones, ur TRACE (A) Negative   Bilirubin Urine SMALL (A) Negative   Hgb urine dipstick NEGATIVE Negative   Urobilinogen, UA 0.2 0.0 - 1.0   Leukocytes, UA NEGATIVE Negative   Nitrite NEGATIVE Negative   WBC, UA 0-2/hpf 0-2/hpf   RBC  / HPF 0-2/hpf 0-2/hpf   Squamous Epithelial / LPF Rare(0-4/hpf) Rare(0-4/hpf)  Lipid Profile     Status: None   Collection Time: 11/24/15  8:09 AM  Result Value Ref Range   Cholesterol 185 0 - 200 mg/dL    Comment: ATP III Classification       Desirable:  < 200 mg/dL               Borderline High:  200 - 239 mg/dL          High:  > = 240 mg/dL   Triglycerides 119.0 0.0 - 149.0 mg/dL    Comment: Normal:  <150 mg/dLBorderline High:  150 - 199 mg/dL   HDL 64.30 >39.00 mg/dL   VLDL 23.8 0.0 - 40.0 mg/dL   LDL Cholesterol 97 0 - 99 mg/dL   Total CHOL/HDL Ratio 3     Comment:                Men  Women1/2 Average Risk     3.4          3.3Average Risk          5.0          4.42X Average Risk          9.6          7.13X Average Risk          15.0          11.0                       NonHDL 120.53     Comment: NOTE:  Non-HDL goal should be 30 mg/dL higher than patient's LDL goal (i.e. LDL goal of < 70 mg/dL, would have non-HDL goal of < 100 mg/dL)  Urinalysis, Routine w reflex microscopic     Status: Abnormal   Collection Time: 01/16/16  7:06 AM  Result Value Ref Range   Color, Urine YELLOW Yellow;Lt. Yellow   APPearance CLEAR Clear   Specific Gravity, Urine 1.015 1.000-1.030   pH 6.5 5.0 - 8.0   Total Protein, Urine NEGATIVE Negative   Urine Glucose NEGATIVE Negative   Ketones, ur NEGATIVE Negative   Bilirubin Urine NEGATIVE Negative   Hgb urine dipstick NEGATIVE Negative   Urobilinogen, UA 0.2 0.0 - 1.0   Leukocytes, UA NEGATIVE Negative   Nitrite NEGATIVE Negative   WBC, UA 0-2/hpf 0-2/hpf   RBC / HPF 0-2/hpf 0-2/hpf   Squamous Epithelial / LPF Rare(0-4/hpf) Rare(0-4/hpf)   Hyaline Casts, UA Presence of (A) None  VITAMIN D 25 Hydroxy (Vit-D Deficiency, Fractures)     Status: Abnormal   Collection Time: 01/16/16  7:06 AM  Result Value Ref Range   VITD 22.31 (L) 30.00 - 100.00 ng/mL  Vitamin B12     Status: Abnormal   Collection Time: 01/16/16  7:06 AM  Result Value Ref Range     Vitamin B-12 179 (L) 211 - 911 pg/mL  Urinalysis     Status: Abnormal   Collection Time: 01/26/16  4:39 PM  Result Value Ref Range   Color, Urine DARK YELLOW YELLOW    Comment: ** Please note change in unit of measure and reference range(s). **      APPearance CLEAR CLEAR   Specific Gravity, Urine 1.038 (H) 1.001 - 1.035   pH 5.5 5.0 - 8.0   Glucose, UA NEGATIVE NEGATIVE   Bilirubin Urine NEGATIVE NEGATIVE    Comment: Verified by repeat analysis.   Ketones, ur TRACE (A) NEGATIVE   Hgb urine dipstick NEGATIVE NEGATIVE   Protein, ur TRACE (A) NEGATIVE   Nitrite NEGATIVE NEGATIVE   Leukocytes, UA NEGATIVE NEGATIVE    Assessment/Plan: Anxiety and depression Will increase Elavil to 25 mg nightly. Continue Xanax as directed. FU 3 months.  Vaginal discharge Endorses recent BV treated with Flagyl and recent UTI. Denies residual dysuria. Notes mild frequency that is chronic for her. Denies vaginal pain or pruritus. Declines exam. Will check UA, Urine culture, Urine ancillary for BV, G/C, yeast and trichomoniasis.

## 2016-02-13 NOTE — Patient Instructions (Signed)
Please stay hydrated and continue medications as directed by GYN. Try the vinegar wiping as directed to help cut down on occurrence of BV. I will call you with all of your results. If indicated by results, we will start additional treatment.  Start the new dose of Amitriptyline for mood and sleep. Continue Xanax as directed. Follow-up in 3 months.

## 2016-02-14 LAB — URINE CYTOLOGY ANCILLARY ONLY
CHLAMYDIA, DNA PROBE: NEGATIVE
NEISSERIA GONORRHEA: NEGATIVE
Trichomonas: NEGATIVE

## 2016-02-15 ENCOUNTER — Encounter: Payer: Self-pay | Admitting: Physician Assistant

## 2016-02-15 LAB — URINE CYTOLOGY ANCILLARY ONLY
Bacterial vaginitis: NEGATIVE
Candida vaginitis: NEGATIVE

## 2016-02-16 LAB — CULTURE, URINE COMPREHENSIVE: Colony Count: 2000

## 2016-02-22 ENCOUNTER — Ambulatory Visit (INDEPENDENT_AMBULATORY_CARE_PROVIDER_SITE_OTHER): Payer: 59 | Admitting: Behavioral Health

## 2016-02-22 ENCOUNTER — Encounter: Payer: Self-pay | Admitting: Physician Assistant

## 2016-02-22 DIAGNOSIS — E538 Deficiency of other specified B group vitamins: Secondary | ICD-10-CM | POA: Diagnosis not present

## 2016-02-22 MED ORDER — CYANOCOBALAMIN 1000 MCG/ML IJ SOLN
1000.0000 ug | Freq: Once | INTRAMUSCULAR | Status: AC
  Administered 2016-02-22: 1000 ug via INTRAMUSCULAR

## 2016-02-22 NOTE — Progress Notes (Signed)
Pre visit review using our clinic review tool, if applicable. No additional management support is needed unless otherwise documented below in the visit note.  Patient in office today for B12 injection. IM given in Left Deltoid. Patient tolerated injection well. Next appointment scheduled for 03/21/16 at 4:00 PM.

## 2016-02-23 ENCOUNTER — Ambulatory Visit: Payer: 59

## 2016-03-07 DIAGNOSIS — E162 Hypoglycemia, unspecified: Secondary | ICD-10-CM

## 2016-03-07 HISTORY — DX: Hypoglycemia, unspecified: E16.2

## 2016-03-15 ENCOUNTER — Other Ambulatory Visit: Payer: Self-pay | Admitting: Physician Assistant

## 2016-03-15 MED ORDER — ALPRAZOLAM 2 MG PO TABS: 1.0000 mg | ORAL_TABLET | Freq: Three times a day (TID) | ORAL | Status: DC | PRN

## 2016-03-15 NOTE — Telephone Encounter (Signed)
Rx faxed to pharmacy/[facility] 06/09

## 2016-03-15 NOTE — Addendum Note (Signed)
Addended by: Marcelline MatesMARTIN, Neidra Girvan on: 03/15/2016 08:56 AM   Modules accepted: Orders

## 2016-03-19 ENCOUNTER — Ambulatory Visit: Payer: 59 | Admitting: Physician Assistant

## 2016-03-20 ENCOUNTER — Encounter: Payer: Self-pay | Admitting: Physician Assistant

## 2016-03-20 ENCOUNTER — Other Ambulatory Visit: Payer: Self-pay | Admitting: Physician Assistant

## 2016-03-20 ENCOUNTER — Other Ambulatory Visit (HOSPITAL_COMMUNITY)
Admission: RE | Admit: 2016-03-20 | Discharge: 2016-03-20 | Disposition: A | Discharge status: Home / Self Care | Payer: 59 | Source: Ambulatory Visit | Attending: Physician Assistant | Admitting: Physician Assistant

## 2016-03-20 ENCOUNTER — Ambulatory Visit (INDEPENDENT_AMBULATORY_CARE_PROVIDER_SITE_OTHER): Payer: 59 | Admitting: Physician Assistant

## 2016-03-20 VITALS — BP 112/82 | HR 68 | Temp 98.6°F | Resp 16 | Ht 66.0 in | Wt 264.5 lb

## 2016-03-20 DIAGNOSIS — N898 Other specified noninflammatory disorders of vagina: Secondary | ICD-10-CM

## 2016-03-20 DIAGNOSIS — Z113 Encounter for screening for infections with a predominantly sexual mode of transmission: Secondary | ICD-10-CM | POA: Diagnosis not present

## 2016-03-20 DIAGNOSIS — M545 Low back pain, unspecified: Secondary | ICD-10-CM

## 2016-03-20 DIAGNOSIS — M546 Pain in thoracic spine: Secondary | ICD-10-CM | POA: Diagnosis not present

## 2016-03-20 LAB — POCT URINALYSIS DIPSTICK
Bilirubin, UA: NEGATIVE
Glucose, UA: NEGATIVE
Ketones, UA: NEGATIVE
Leukocytes, UA: NEGATIVE
NITRITE UA: NEGATIVE
PH UA: 6
Protein, UA: NEGATIVE
RBC UA: NEGATIVE
Spec Grav, UA: 1.02
UROBILINOGEN UA: 0.2

## 2016-03-20 MED ORDER — FLUCONAZOLE 150 MG PO TABS: 150.0000 mg | ORAL_TABLET | Freq: Once | ORAL | Status: DC

## 2016-03-20 MED FILL — OMEPRAZOLE DR 20 MG CAPSULE: 20 | 30 days supply | Qty: 60 | Fill #0 | Status: TO

## 2016-03-20 MED FILL — FLUCONAZOLE 150 MG TABLET: 150 | 2 days supply | Qty: 2 | Fill #0

## 2016-03-20 MED FILL — ALPRAZolam 2 MG TABS: 2 | 60 days supply | Qty: 90 | Fill #0

## 2016-03-20 NOTE — Progress Notes (Signed)
Pre visit review using our clinic review tool, if applicable. No additional management support is needed unless otherwise documented below in the visit note/SLS  

## 2016-03-20 NOTE — Progress Notes (Signed)
Patient with history of both bacterial vaginitis and yeast vaginitis presents to clinic today c/o thick white vaginal discharge 3 days. Patient endorses some mild vaginal discomfort. Denies pruritus, vaginal lesion, dyspareunia, urinary urgency, frequency or dysuria. Denies hematuria.  She has had some right-sided lumbar pain with current symptoms since this morning. Denies fever, chills, nausea or vomiting. Denies suprapubic pressure or pain.   Past Medical History  Diagnosis Date  . Syncope     with ? seizure in 2015, eval with neruo  . Migraine   . GERD (gastroesophageal reflux disease)     hx gastric ulcer and upper GI bleed, ? NSAID induced  . Anxiety and depression     started with PPD  . Allergy   . Hypertension   . Bacterial vaginitis   . Vitamin D deficiency   . Vitamin B12 deficiency   . Chronic neck pain     sees [place] [facility] for neck strain  . Seizure ([place])     s/p eval with neuro, no medication, no events since    No current facility-administered medications on file prior to visit.   Current Outpatient Prescriptions on File Prior to Visit  Medication Sig Dispense Refill  . alprazolam (XANAX) 2 MG tablet Take 0.5 tablets (1 mg total) by mouth 3 (three) times daily as needed for anxiety. 90 tablet 0  . amitriptyline (ELAVIL) 25 MG tablet Take 1 tablet (25 mg total) by mouth at bedtime. 30 tablet 5  . BIOTIN PO Take by mouth daily.    . Calcium 250 MG CAPS Take by mouth daily.    . cetirizine (ZYRTEC) 10 MG tablet Take 1 tablet (10 mg total) by mouth daily. 30 tablet 3  . Cholecalciferol (VITAMIN D) 2000 UNITS CAPS Take by mouth daily.    Marland Kitchen eletriptan (RELPAX) 40 MG tablet Take 1 tablet (40 mg total) by mouth as needed for migraine or headache. One tablet by mouth at onset of headache. May repeat in 2 hours if headache persists or recurs. 10 tablet 3  . FeFum-FePoly-FA-B Cmp-C-Biot (INTEGRA PLUS) CAPS 1 PO daily for anemia 30 capsule 3  . methocarbamol  (ROBAXIN) 500 MG tablet Take 500 mg by mouth every 8 (eight) hours as needed for muscle spasms.    Marland Kitchen MONONESSA 0.25-35 MG-MCG tablet TK 1 T PO QD  4  . omeprazole (PRILOSEC) 20 MG capsule TAKE 1 CAPSULE BY MOUTH 2 TIMES DAILY BEFORE A MEAL 60 capsule 11  . ondansetron (ZOFRAN) 8 MG tablet Take by mouth every 8 (eight) hours as needed for nausea or vomiting.    Marland Kitchen oxyCODONE-acetaminophen (PERCOCET) 10-325 MG per tablet Take 1 tablet by mouth every 8 (eight) hours as needed for pain. Take 1 tablet by mouth every 8 hours as needed for severe pain. 15 tablet 0  . vitamin B-12 (CYANOCOBALAMIN) 1000 MCG tablet Take 1,000 mcg by mouth daily.    . Vitamin D, Ergocalciferol, (DRISDOL) 50000 units CAPS capsule Take 1 capsule (50,000 units) by mouth every 7 days for 10 weeks. 10 capsule 0    Allergies  Allergen Reactions  . Ibuprofen     Causes ulcers    Family History  Problem Relation Age of Onset  . Arthritis Mother   . Aneurysm Father     deceased  . Lung cancer Maternal Aunt   . Hyperlipidemia Mother   . Heart disease Mother   . Diabetes Mother   . Asthma Mother     Social  History   Social History  . Marital Status: Married    Spouse Name: N/A  . Number of Children: N/A  . Years of Education: N/A   Social History Main Topics  . Smoking status: Never Smoker   . Smokeless tobacco: Never Used  . Alcohol Use: 0.0 oz/week    0 Standard drinks or equivalent per week     Comment: occasional   . Drug Use: No  . Sexual Activity: Not Asked   Other Topics Concern  . None   Social History Narrative   Review of Systems - See HPI.  All other ROS are negative.  BP 112/82 mmHg  Pulse 68  Temp(Src) 98.6 F (37 C) (Oral)  Resp 16  Ht _0  (1.676 m)  Wt 264 lb 8 oz (119.976 kg)  BMI 42.71 kg/m2  SpO2 100%  LMP 03/06/2016  Physical Exam  Constitutional: She is well-developed, well-nourished, and in no distress.  HENT:  Head: Normocephalic and atraumatic.  Eyes: Conjunctivae  are normal.  Cardiovascular: Normal rate, regular rhythm, normal heart sounds and intact distal pulses.   Pulmonary/Chest: Effort normal and breath sounds normal. No respiratory distress. She has no wheezes. She has no rales. She exhibits no tenderness.  Abdominal: Soft. Bowel sounds are normal. She exhibits no distension. There is no tenderness. There is no rebound.  Negative CVA tenderness.  Genitourinary:  Patient declining pelvic exam today AMA. Wants things assessed through urine.  Musculoskeletal:       Lumbar back: She exhibits tenderness. She exhibits normal range of motion and no bony tenderness.  Vitals reviewed.   Recent Results (from the past 2160 hour(s))  Urinalysis, Routine w reflex microscopic     Status: Abnormal   Collection Time: 01/16/16  7:06 AM  Result Value Ref Range   Color, Urine YELLOW Yellow;Lt. Yellow   APPearance CLEAR Clear   Specific Gravity, Urine 1.015 1.000-1.030   pH 6.5 5.0 - 8.0   Total Protein, Urine NEGATIVE Negative   Urine Glucose NEGATIVE Negative   Ketones, ur NEGATIVE Negative   Bilirubin Urine NEGATIVE Negative   Hgb urine dipstick NEGATIVE Negative   Urobilinogen, UA 0.2 0.0 - 1.0   Leukocytes, UA NEGATIVE Negative   Nitrite NEGATIVE Negative   WBC, UA 0-2/hpf 0-2/hpf   RBC / HPF 0-2/hpf 0-2/hpf   Squamous Epithelial / LPF Rare(0-4/hpf) Rare(0-4/hpf)   Hyaline Casts, UA Presence of (A) None  VITAMIN D 25 Hydroxy (Vit-D Deficiency, Fractures)     Status: Abnormal   Collection Time: 01/16/16  7:06 AM  Result Value Ref Range   VITD 22.31 (L) 30.00 - 100.00 ng/mL  Vitamin B12     Status: Abnormal   Collection Time: 01/16/16  7:06 AM  Result Value Ref Range   Vitamin B-12 179 (L) 211 - 911 pg/mL  Urinalysis     Status: Abnormal   Collection Time: 01/26/16  4:39 PM  Result Value Ref Range   Color, Urine DARK YELLOW YELLOW    Comment: ** Please note change in unit of measure and reference range(s). **      APPearance CLEAR CLEAR    Specific Gravity, Urine 1.038 (H) 1.001 - 1.035   pH 5.5 5.0 - 8.0   Glucose, UA NEGATIVE NEGATIVE   Bilirubin Urine NEGATIVE NEGATIVE    Comment: Verified by repeat analysis.   Ketones, ur TRACE (A) NEGATIVE   Hgb urine dipstick NEGATIVE NEGATIVE   Protein, ur TRACE (A) NEGATIVE   Nitrite NEGATIVE NEGATIVE  Leukocytes, UA NEGATIVE NEGATIVE  Urine cytology ancillary only     Status: None   Collection Time: 02/13/16 12:00 AM  Result Value Ref Range   Chlamydia Negative     Comment: Normal Reference Range - Negative   Neisseria gonorrhea Negative     Comment: Normal Reference Range - Negative   Trichomonas Negative     Comment: Normal Reference Range - Negative  Urine cytology ancillary only     Status: None   Collection Time: 02/13/16 12:00 AM  Result Value Ref Range   Bacterial vaginitis Negative for Bacterial Vaginitis Microorganisms     Comment: Normal Reference Range - Negative   Candida vaginitis Negative for Candida Vaginitis Microorganisms     Comment: Normal Reference Range - Negative  POCT urinalysis dipstick     Status: None   Collection Time: 02/13/16  8:00 AM  Result Value Ref Range   Color, UA yellow    Clarity, UA cloudy    Glucose, UA neg    Bilirubin, UA positive    Ketones, UA positive    Spec Grav, UA >=1.030    Blood, UA negative    pH, UA 6.0    Protein, UA positive    Urobilinogen, UA 0.2    Nitrite, UA neg    Leukocytes, UA Negative Negative  CULTURE, URINE COMPREHENSIVE     Status: None   Collection Time: 02/13/16  8:35 AM  Result Value Ref Range   Colony Count 2,000 COLONIES/ML    Organism ID, Bacteria GROUP B STREP (S.AGALACTIAE) ISOLATED     Comment: Beta hemolytic streptococci are predictably susceptible to penicillin and other beta-lactams. Susceptibility testing not routinely performed.   POCT urinalysis dipstick     Status: None   Collection Time: 03/20/16  2:26 PM  Result Value Ref Range   Color, UA straw    Clarity, UA clear     Glucose, UA neg    Bilirubin, UA neg    Ketones, UA neg    Spec Grav, UA 1.020    Blood, UA neg    pH, UA 6.0    Protein, UA neg    Urobilinogen, UA 0.2    Nitrite, UA neg    Leukocytes, UA Negative Negative  N. gonorrhoeae, RNA     Status: None   Collection Time: 03/20/16  3:07 PM  Result Value Ref Range   GC Probe RNA NOT DETECTED     Comment:                    **Normal Reference Range: NOT DETECTED**   This test was performed using the APTIMA COMBO2 Assay ([employer]   The analytical performance characteristics of this assay, when used to test SurePath specimens have been determined by [facility]     CBG monitoring, ED     Status: None   Collection Time: 03/21/16 11:16 AM  Result Value Ref Range   Glucose-Capillary 72 65 - 99 mg/dL  Protime-INR     Status: None   Collection Time: 03/21/16 11:32 AM  Result Value Ref Range   Prothrombin Time 14.5 11.6 - 15.2 seconds   INR 1.11 0.00 - 1.49  APTT     Status: None   Collection Time: 03/21/16 11:32 AM  Result Value Ref Range   aPTT 30 24 - 37 seconds  CBC     Status: None   Collection Time: 03/21/16 11:32 AM  Result Value Ref Range   WBC 8.5  4.0 - 10.5 K/uL   RBC 4.63 3.87 - 5.11 MIL/uL   Hemoglobin 12.7 12.0 - 15.0 g/dL   HCT 39.8 36.0 - 46.0 %   MCV 86.0 78.0 - 100.0 fL   MCH 27.4 26.0 - 34.0 pg   MCHC 31.9 30.0 - 36.0 g/dL   RDW 14.5 11.5 - 15.5 %   Platelets 338 150 - 400 K/uL  Differential     Status: None   Collection Time: 03/21/16 11:32 AM  Result Value Ref Range   Neutrophils Relative % 54 %   Neutro Abs 4.7 1.7 - 7.7 K/uL   Lymphocytes Relative 37 %   Lymphs Abs 3.1 0.7 - 4.0 K/uL   Monocytes Relative 8 %   Monocytes Absolute 0.7 0.1 - 1.0 K/uL   Eosinophils Relative 1 %   Eosinophils Absolute 0.0 0.0 - 0.7 K/uL   Basophils Relative 0 %   Basophils Absolute 0.0 0.0 - 0.1 K/uL  Comprehensive metabolic panel     Status: Abnormal   Collection Time: 03/21/16 11:32 AM  Result Value Ref  Range   Sodium 140 135 - 145 mmol/L   Potassium 4.1 3.5 - 5.1 mmol/L   Chloride 112 (H) 101 - 111 mmol/L   CO2 21 (L) 22 - 32 mmol/L   Glucose, Bld 90 65 - 99 mg/dL   BUN 11 6 - 20 mg/dL   Creatinine, Ser 1.09 (H) 0.44 - 1.00 mg/dL   Calcium 9.3 8.9 - 10.3 mg/dL   Total Protein 6.6 6.5 - 8.1 g/dL   Albumin 3.5 3.5 - 5.0 g/dL   AST 17 15 - 41 U/L   ALT 14 14 - 54 U/L   Alkaline Phosphatase 54 38 - 126 U/L   Total Bilirubin 0.3 0.3 - 1.2 mg/dL   GFR calc non Af Amer >60 >60 mL/min   GFR calc Af Amer >60 >60 mL/min    Comment: (NOTE) The eGFR has been calculated using the CKD EPI equation. This calculation has not been validated in all clinical situations. eGFR's persistently <60 mL/min signify possible Chronic Kidney Disease.    Anion gap 7 5 - 15  I-stat troponin, ED     Status: None   Collection Time: 03/21/16 11:36 AM  Result Value Ref Range   Troponin i, poc 0.00 0.00 - 0.08 ng/mL   Comment 3            Comment: Due to the release kinetics of cTnI, a negative result within the first hours of the onset of symptoms does not rule out myocardial infarction with certainty. If myocardial infarction is still suspected, repeat the test at appropriate intervals.   I-Stat Chem 8, ED     Status: Abnormal   Collection Time: 03/21/16 11:38 AM  Result Value Ref Range   Sodium 143 135 - 145 mmol/L   Potassium 4.1 3.5 - 5.1 mmol/L   Chloride 107 101 - 111 mmol/L   BUN 13 6 - 20 mg/dL   Creatinine, Ser 1.10 (H) 0.44 - 1.00 mg/dL   Glucose, Bld 86 65 - 99 mg/dL   Calcium, Ion 1.26 (H) 1.12 - 1.23 mmol/L   TCO2 22 0 - 100 mmol/L   Hemoglobin 13.6 12.0 - 15.0 g/dL   HCT 40.0 36.0 - 46.0 %  CBG monitoring, ED     Status: Abnormal   Collection Time: 03/21/16 12:42 PM  Result Value Ref Range   Glucose-Capillary 54 (L) 65 - 99 mg/dL  CBG monitoring, ED  Status: Abnormal   Collection Time: 03/21/16  1:00 PM  Result Value Ref Range   Glucose-Capillary 101 (H) 65 - 99 mg/dL  I-Stat  Beta hCG blood, ED ([facility], [facility], [facility] only)     Status: None   Collection Time: 03/21/16  1:15 PM  Result Value Ref Range   I-stat hCG, quantitative <5.0 <5 mIU/mL   Comment 3            Comment:   GEST. AGE      CONC.  (mIU/mL)   <=1 WEEK        5 - 50     2 WEEKS       50 - 500     3 WEEKS       100 - 10,000     4 WEEKS     1,000 - 30,000        FEMALE AND NON-PREGNANT FEMALE:     LESS THAN 5 mIU/mL     Assessment/Plan: 1. Right-sided low back pain without sciatica Exam with mild muscular tenderness of perispinal musculature. RICE discussed. OTC pain medication reviewed. Heating pad recommended. Urine dip completely unremarkable.   - POCT urinalysis dipstick - Urine cytology ancillary only - GC Probe amplification, urine - Urine culture  2. Vaginal discharge Is refusing pelvic exam. Can check urine ancillary studies. Giving PMH and current symptoms will give a Diflucan while results are pending. Again urine dip completely unremarkable. Will send for culture and ancillary studies for yeast, BV, trich, GC/ chlamydia. Will alter regimen based on results.  - POCT urinalysis dipstick; Standing - POCT urinalysis dipstick - Urine cytology ancillary only - GC Probe amplification, urine - Urine culture

## 2016-03-20 NOTE — Patient Instructions (Signed)
Please take the diflucan as directed.  Increase fluids. Take a cranberry supplement for overall urinary tract health.   Tylenol if needed for pain.  Since you are declining pelvic exam today we will wait on your urine results before changing therapy.  If you note any new or worsening symptoms, please return for pelvic exam.

## 2016-03-21 ENCOUNTER — Observation Stay (HOSPITAL_COMMUNITY): Payer: 59

## 2016-03-21 ENCOUNTER — Ambulatory Visit: Payer: 59

## 2016-03-21 ENCOUNTER — Emergency Department (HOSPITAL_COMMUNITY): Payer: 59

## 2016-03-21 ENCOUNTER — Ambulatory Visit: Payer: 59 | Admitting: Family Medicine

## 2016-03-21 ENCOUNTER — Observation Stay (HOSPITAL_COMMUNITY)
Admission: EM | Admit: 2016-03-21 | Discharge: 2016-03-24 | Disposition: A | Discharge status: Home / Self Care | Payer: 59 | Attending: Internal Medicine | Admitting: Internal Medicine

## 2016-03-21 ENCOUNTER — Encounter (HOSPITAL_COMMUNITY): Payer: Self-pay | Admitting: Emergency Medicine

## 2016-03-21 DIAGNOSIS — K219 Gastro-esophageal reflux disease without esophagitis: Secondary | ICD-10-CM | POA: Diagnosis present

## 2016-03-21 DIAGNOSIS — F419 Anxiety disorder, unspecified: Secondary | ICD-10-CM | POA: Diagnosis not present

## 2016-03-21 DIAGNOSIS — Z833 Family history of diabetes mellitus: Secondary | ICD-10-CM | POA: Diagnosis not present

## 2016-03-21 DIAGNOSIS — E162 Hypoglycemia, unspecified: Principal | ICD-10-CM | POA: Diagnosis present

## 2016-03-21 DIAGNOSIS — E539 Vitamin B deficiency, unspecified: Secondary | ICD-10-CM | POA: Diagnosis not present

## 2016-03-21 DIAGNOSIS — I10 Essential (primary) hypertension: Secondary | ICD-10-CM | POA: Insufficient documentation

## 2016-03-21 DIAGNOSIS — Z6841 Body Mass Index (BMI) 40.0 and over, adult: Secondary | ICD-10-CM | POA: Insufficient documentation

## 2016-03-21 DIAGNOSIS — Z9884 Bariatric surgery status: Secondary | ICD-10-CM | POA: Insufficient documentation

## 2016-03-21 DIAGNOSIS — F329 Major depressive disorder, single episode, unspecified: Secondary | ICD-10-CM | POA: Insufficient documentation

## 2016-03-21 DIAGNOSIS — K210 Gastro-esophageal reflux disease with esophagitis: Secondary | ICD-10-CM | POA: Diagnosis not present

## 2016-03-21 DIAGNOSIS — E559 Vitamin D deficiency, unspecified: Secondary | ICD-10-CM | POA: Diagnosis not present

## 2016-03-21 DIAGNOSIS — Z3202 Encounter for pregnancy test, result negative: Secondary | ICD-10-CM | POA: Diagnosis not present

## 2016-03-21 DIAGNOSIS — R410 Disorientation, unspecified: Secondary | ICD-10-CM | POA: Diagnosis not present

## 2016-03-21 DIAGNOSIS — E669 Obesity, unspecified: Secondary | ICD-10-CM | POA: Diagnosis not present

## 2016-03-21 DIAGNOSIS — R4182 Altered mental status, unspecified: Secondary | ICD-10-CM

## 2016-03-21 DIAGNOSIS — G9340 Encephalopathy, unspecified: Secondary | ICD-10-CM

## 2016-03-21 DIAGNOSIS — F39 Unspecified mood [affective] disorder: Secondary | ICD-10-CM | POA: Diagnosis present

## 2016-03-21 DIAGNOSIS — E538 Deficiency of other specified B group vitamins: Secondary | ICD-10-CM | POA: Diagnosis present

## 2016-03-21 LAB — TSH
TSH: 1.205 u[IU]/mL (ref 0.350–4.500)
TSH: 1.596 u[IU]/mL (ref 0.350–4.500)

## 2016-03-21 LAB — DIFFERENTIAL
Basophils Absolute: 0 10*3/uL (ref 0.0–0.1)
Basophils Relative: 0 %
EOS ABS: 0 10*3/uL (ref 0.0–0.7)
EOS PCT: 1 %
Lymphocytes Relative: 37 %
Lymphs Abs: 3.1 10*3/uL (ref 0.7–4.0)
MONO ABS: 0.7 10*3/uL (ref 0.1–1.0)
MONOS PCT: 8 %
NEUTROS PCT: 54 %
Neutro Abs: 4.7 10*3/uL (ref 1.7–7.7)

## 2016-03-21 LAB — I-STAT CHEM 8, ED
BUN: 13 mg/dL (ref 6–20)
CALCIUM ION: 1.26 mmol/L — AB (ref 1.12–1.23)
CHLORIDE: 107 mmol/L (ref 101–111)
Creatinine, Ser: 1.1 mg/dL — ABNORMAL HIGH (ref 0.44–1.00)
GLUCOSE: 86 mg/dL (ref 65–99)
HCT: 40 % (ref 36.0–46.0)
Hemoglobin: 13.6 g/dL (ref 12.0–15.0)
Potassium: 4.1 mmol/L (ref 3.5–5.1)
SODIUM: 143 mmol/L (ref 135–145)
TCO2: 22 mmol/L (ref 0–100)

## 2016-03-21 LAB — URINALYSIS, ROUTINE W REFLEX MICROSCOPIC
BILIRUBIN URINE: NEGATIVE
GLUCOSE, UA: 250 mg/dL — AB
HGB URINE DIPSTICK: NEGATIVE
Ketones, ur: NEGATIVE mg/dL
Leukocytes, UA: NEGATIVE
Nitrite: NEGATIVE
PROTEIN: NEGATIVE mg/dL
Specific Gravity, Urine: 1.012 (ref 1.005–1.030)
pH: 5.5 (ref 5.0–8.0)

## 2016-03-21 LAB — CBC
HCT: 39.8 % (ref 36.0–46.0)
Hemoglobin: 12.7 g/dL (ref 12.0–15.0)
MCH: 27.4 pg (ref 26.0–34.0)
MCHC: 31.9 g/dL (ref 30.0–36.0)
MCV: 86 fL (ref 78.0–100.0)
PLATELETS: 338 10*3/uL (ref 150–400)
RBC: 4.63 MIL/uL (ref 3.87–5.11)
RDW: 14.5 % (ref 11.5–15.5)
WBC: 8.5 10*3/uL (ref 4.0–10.5)

## 2016-03-21 LAB — CBG MONITORING, ED
GLUCOSE-CAPILLARY: 72 mg/dL (ref 65–99)
GLUCOSE-CAPILLARY: 101 mg/dL — AB (ref 65–99)
GLUCOSE-CAPILLARY: 54 mg/dL — AB (ref 65–99)
GLUCOSE-CAPILLARY: 111 mg/dL — AB (ref 65–99)
GLUCOSE-CAPILLARY: 74 mg/dL (ref 65–99)
GLUCOSE-CAPILLARY: 84 mg/dL (ref 65–99)
GLUCOSE-CAPILLARY: 76 mg/dL (ref 65–99)
GLUCOSE-CAPILLARY: 79 mg/dL (ref 65–99)
Glucose-Capillary: 103 mg/dL — ABNORMAL HIGH (ref 65–99)
Glucose-Capillary: 47 mg/dL — ABNORMAL LOW (ref 65–99)
Glucose-Capillary: 80 mg/dL (ref 65–99)
Glucose-Capillary: 94 mg/dL (ref 65–99)

## 2016-03-21 LAB — COMPREHENSIVE METABOLIC PANEL
ALK PHOS: 54 U/L (ref 38–126)
ALT: 14 U/L (ref 14–54)
ANION GAP: 7 (ref 5–15)
AST: 17 U/L (ref 15–41)
Albumin: 3.5 g/dL (ref 3.5–5.0)
BILIRUBIN TOTAL: 0.3 mg/dL (ref 0.3–1.2)
BUN: 11 mg/dL (ref 6–20)
CALCIUM: 9.3 mg/dL (ref 8.9–10.3)
CO2: 21 mmol/L — ABNORMAL LOW (ref 22–32)
Chloride: 112 mmol/L — ABNORMAL HIGH (ref 101–111)
Creatinine, Ser: 1.09 mg/dL — ABNORMAL HIGH (ref 0.44–1.00)
GLUCOSE: 90 mg/dL (ref 65–99)
POTASSIUM: 4.1 mmol/L (ref 3.5–5.1)
Sodium: 140 mmol/L (ref 135–145)
TOTAL PROTEIN: 6.6 g/dL (ref 6.5–8.1)

## 2016-03-21 LAB — PROTIME-INR
INR: 1.11 (ref 0.00–1.49)
PROTHROMBIN TIME: 14.5 s (ref 11.6–15.2)

## 2016-03-21 LAB — FOLATE: Folate: 16.1 ng/mL (ref >5.9)

## 2016-03-21 LAB — RAPID URINE DRUG SCREEN, HOSP PERFORMED
Amphetamines: NOT DETECTED
BENZODIAZEPINES: POSITIVE — AB
Barbiturates: NOT DETECTED
COCAINE: NOT DETECTED
Opiates: NOT DETECTED
Tetrahydrocannabinol: NOT DETECTED

## 2016-03-21 LAB — GLUCOSE, CAPILLARY: Glucose-Capillary: 94 mg/dL (ref 65–99)

## 2016-03-21 LAB — CORTISOL: Cortisol, Plasma: 9.8 ug/dL

## 2016-03-21 LAB — I-STAT TROPONIN, ED: TROPONIN I, POC: 0 ng/mL (ref 0.00–0.08)

## 2016-03-21 LAB — VITAMIN B12: Vitamin B-12: 754 pg/mL (ref 180–914)

## 2016-03-21 LAB — MRSA PCR SCREENING: MRSA by PCR: NEGATIVE

## 2016-03-21 LAB — I-STAT BETA HCG BLOOD, ED (MC, WL, AP ONLY): I-stat hCG, quantitative: <5 m[IU]/mL (ref <5)

## 2016-03-21 LAB — URINE CULTURE: Colony Count: 25000

## 2016-03-21 LAB — APTT: aPTT: 30 seconds (ref 24–37)

## 2016-03-21 LAB — SALICYLATE LEVEL: Salicylate Lvl: <4 mg/dL (ref 2.8–30.0)

## 2016-03-21 LAB — ACETAMINOPHEN LEVEL: Acetaminophen (Tylenol), Serum: <10 ug/mL — ABNORMAL LOW (ref 10–30)

## 2016-03-21 LAB — NEISSERIA GONORRHOEAE, PROBE AMP: GC PROBE AMP APTIMA: NOT DETECTED

## 2016-03-21 LAB — BETA-HYDROXYBUTYRIC ACID: Beta-Hydroxybutyric Acid: 0.06 mmol/L (ref 0.05–0.27)

## 2016-03-21 MED ORDER — ALPRAZOLAM 0.25 MG PO TABS
0.2500 mg | ORAL_TABLET | Freq: Two times a day (BID) | ORAL | Status: DC | PRN
Start: 2016-03-21 — End: 2016-03-24
  Administered 2016-03-21 – 2016-03-23 (×4): 0.25 mg via ORAL
  Filled 2016-03-21 (×4): qty 1

## 2016-03-21 MED ORDER — DEXTROSE 50 % IV SOLN
INTRAVENOUS | Status: AC
  Filled 2016-03-21: qty 50

## 2016-03-21 MED ORDER — AMITRIPTYLINE HCL 25 MG PO TABS
25.0000 mg | ORAL_TABLET | Freq: Every day | ORAL | Status: DC
  Administered 2016-03-21 – 2016-03-23 (×3): 25 mg via ORAL
  Filled 2016-03-21 (×5): qty 1

## 2016-03-21 MED ORDER — BIOTIN 5 MG PO TABS: ORAL_TABLET | Freq: Every day | ORAL | Status: DC

## 2016-03-21 MED ORDER — VITAMIN B-12 1000 MCG PO TABS
1000.0000 ug | ORAL_TABLET | Freq: Every day | ORAL | Status: DC
  Administered 2016-03-22 – 2016-03-24 (×3): 1000 ug via ORAL
  Filled 2016-03-21 (×3): qty 1

## 2016-03-21 MED ORDER — PANTOPRAZOLE SODIUM 40 MG PO TBEC
40.0000 mg | DELAYED_RELEASE_TABLET | Freq: Every day | ORAL | Status: DC
  Administered 2016-03-21 – 2016-03-24 (×4): 40 mg via ORAL
  Filled 2016-03-21 (×4): qty 1

## 2016-03-21 MED ORDER — VITAMIN D 50 MCG (2000 UT) PO CAPS: ORAL_CAPSULE | Freq: Every day | ORAL | Status: DC

## 2016-03-21 MED ORDER — DEXTROSE-NACL 5-0.45 % IV SOLN
INTRAVENOUS | Status: DC
  Administered 2016-03-21: 17:00:00 via INTRAVENOUS

## 2016-03-21 MED ORDER — CALCIUM 250 MG PO CAPS: ORAL_CAPSULE | Freq: Every day | ORAL | Status: DC

## 2016-03-21 MED ORDER — DEXTROSE-NACL 5-0.45 % IV SOLN
INTRAVENOUS | Status: DC
  Administered 2016-03-21 – 2016-03-22 (×2): via INTRAVENOUS

## 2016-03-21 MED ORDER — VITAMIN D 1000 UNITS PO TABS
2000.0000 [IU] | ORAL_TABLET | Freq: Every day | ORAL | Status: DC
Start: 2016-03-22 — End: 2016-03-24
  Administered 2016-03-22 – 2016-03-24 (×3): 2000 [IU] via ORAL
  Filled 2016-03-21 (×3): qty 2

## 2016-03-21 MED ORDER — ELETRIPTAN HYDROBROMIDE 40 MG PO TABS
40.0000 mg | ORAL_TABLET | ORAL | Status: DC | PRN
  Filled 2016-03-21: qty 1

## 2016-03-21 MED ORDER — SODIUM CHLORIDE 0.9 % IV BOLUS (SEPSIS): 1000.0000 mL | Freq: Once | INTRAVENOUS | Status: DC

## 2016-03-21 MED ORDER — DEXTROSE 5 % IV SOLN: Freq: Once | INTRAVENOUS | Status: DC

## 2016-03-21 MED ORDER — CALCIUM CARBONATE 1250 (500 CA) MG PO TABS
1.0000 | ORAL_TABLET | Freq: Every day | ORAL | Status: DC
  Administered 2016-03-22 – 2016-03-24 (×3): 500 mg via ORAL
  Filled 2016-03-21 (×3): qty 1

## 2016-03-21 MED ORDER — HEPARIN SODIUM (PORCINE) 5000 UNIT/ML IJ SOLN
5000.0000 [IU] | Freq: Three times a day (TID) | INTRAMUSCULAR | Status: DC
  Administered 2016-03-21 – 2016-03-24 (×7): 5000 [IU] via SUBCUTANEOUS
  Filled 2016-03-21 (×7): qty 1

## 2016-03-21 MED ORDER — DEXTROSE 50 % IV SOLN
50.0000 mL | Freq: Once | INTRAVENOUS | Status: AC
  Administered 2016-03-21: 50 mL via INTRAVENOUS

## 2016-03-21 NOTE — ED Provider Notes (Signed)
Medical screening examination/treatment/procedure(s) were conducted as a shared visit with non-physician practitioner(s) and myself.  I personally evaluated the patient during the encounter.   EKG Interpretation   Date/Time:  Thursday March 21 2016 11:15:57 EDT Ventricular Rate:  83 PR Interval:  144 QRS Duration: 84 QT Interval:  374 QTC Calculation: 439 R Axis:   19 Text Interpretation:  Normal sinus rhythm Normal ECG Normal EKG. No prior  EKg  Confirmed by Viva Gallaher MD, Cristian Davitt [phone]) on 03/21/2016 1:03:[id number] PM      40 year old female who presents with altered mental status. She has a history of syncope, migraine headaches, hypertension, anxiety and depression. History is primarily provided by the patient's husband who states that patient woke up this morning seemingly altered and confused. States that she was very slow to answer questions, and at times answer questions inappropriately. In the shower patient collapsed, and he states that he was able to catch her and she may have questionable loss of consciousness for a few seconds. She went to work and had a subsequent syncopal episode witnessed by coworkers. Since then has seemed increasingly slow to respond and confused. Brought to ED husband. She denies any tongue biting, urinary or fecal incontinence, numbness or weakness, double vision, or speech changes. She has not had chest pain difficulty breathing or recent infections. No recent medication changes but states that more recently her doctor has been trying to increase her gabapentin but she did not take any last night or today. On presentation, vital signs are within normal limits. She is alert, but does appear disoriented and is very slow to answer questions. Globally weak, but otherwise grossly neurologically intact. CVA workup was initially initiated in triage, with negative CT head. Presentation does not seem fully consistent with that of stroke. She does have recurrent episodes of hypoglycemia  here in the emergency department but does not respond to D50, and subsequently placed on a dextrose drip. Even after correcting for her hypoglycemia she still shows persistent confusion. She is admitted to stepdown under hospitalist service for ongoing management and workup.  Toni Guiseana Duo Avalynne Diver, MD 03/21/16 [phone]

## 2016-03-21 NOTE — ED Notes (Signed)
Pt remains lethargic, family member at bedside. CBG 80

## 2016-03-21 NOTE — ED Notes (Signed)
Spoke w/ Dr. Randol KernElgergawy regarding pt going to MRI. Ok for pt to travel to MRI w/ d5 .45NS running w/o RN accompanying pt and off monitor.  Aware CBG 75. States pt is stable to do so. Pt able to drink apple juice prior to traveling to MRI.

## 2016-03-21 NOTE — ED Provider Notes (Signed)
CSN: [id number]     Arrival date & time 03/21/16  1103 History   First MD Initiated Contact with Patient 03/21/16 1229     Chief Complaint  Patient presents with  . stroke like symptoms      (Consider location/radiation/quality/duration/timing/severity/associated sxs/prior Treatment) HPI Comments: Toni [name] is a 40 y.o. female with history of obesity, anxiety, depression, and vitamin D and B deficiency presents to ED with AMS. Per husband patient states last known normal was yesterday evening around 7pm. States patient seemed a little more lethargic than normal, she went to bed early; however, woke up in the middle of the night working around the house. Husband noticed some slurred speech and patient fell out of the shower this morning, possible LOC, no muscle jerking noted. He drove patient to work and states patient kept asking the same question over and over again. At work, patient had LOC witnessed by co-workers. No jerking movements were noted; however patient was disoriented and confused, she did not know the year or who the president was. Per husband, patient was evaluated for possible seizure in July 2015, she is not currently on any seizure medication.    Level V caveat - AMS  The history is provided by the patient, the spouse and medical records.    Past Medical History  Diagnosis Date  . Syncope     with ? seizure in 2015, eval with neruo  . Migraine   . GERD (gastroesophageal reflux disease)     hx gastric ulcer and upper GI bleed, ? NSAID induced  . Anxiety and depression     started with PPD  . Allergy   . Hypertension   . Bacterial vaginitis   . Vitamin D deficiency   . Vitamin B12 deficiency   . Chronic neck pain     sees [place] for neck strain  . Seizure (HCC)     s/p eval with neuro, no medication, no events since   Past Surgical History  Procedure Laterality Date  . Gastric bypass  2002  . Cesarean section  2006  . Tubuligation   2006  . Tonsillectomy and adenoidectomy    . Tubes in ears both Bilateral   . Eye surgery Bilateral     2001  . Gastric bypass    . Endometrial biopsy     Family History  Problem Relation Age of Onset  . Arthritis Mother   . Aneurysm Father     deceased  . Lung cancer Maternal Aunt   . Hyperlipidemia Mother   . Heart disease Mother   . Diabetes Mother   . Asthma Mother    Social History  Substance Use Topics  . Smoking status: Never Smoker   . Smokeless tobacco: Never Used  . Alcohol Use: 0.0 oz/week    0 Standard drinks or equivalent per week     Comment: occasional    OB History    No data available     Review of Systems  Constitutional: Negative for fever, chills, diaphoresis and fatigue.  HENT: Negative for sore throat and trouble swallowing.   Eyes: Negative for visual disturbance.  Respiratory: Negative for shortness of breath.   Cardiovascular: Negative for chest pain.  Gastrointestinal: Negative for nausea, vomiting, abdominal pain, diarrhea, constipation and blood in stool.  Genitourinary: Negative for dysuria and hematuria.  Musculoskeletal: Positive for back pain ( right lower back).  Skin: Negative for rash.  Neurological: Negative for dizziness, weakness, light-headedness, numbness and  headaches.      Allergies  Ibuprofen  Home Medications   Prior to Admission medications   Medication Sig Start Date End Date Taking? Authorizing Provider  alprazolam Prudy Feeler(XANAX) 2 MG tablet Take 0.5 tablets (1 mg total) by mouth 3 (three) times daily as needed for anxiety. 03/15/16   Waldon MerlWilliam C Martin, PA-C  amitriptyline (ELAVIL) 25 MG tablet Take 1 tablet (25 mg total) by mouth at bedtime. 02/13/16   Waldon MerlWilliam C Martin, PA-C  BIOTIN PO Take by mouth daily.    Historical Provider, MD  Calcium 250 MG CAPS Take by mouth daily.    Historical Provider, MD  cetirizine (ZYRTEC) 10 MG tablet Take 1 tablet (10 mg total) by mouth daily. 11/24/15   Waldon MerlWilliam C Martin, PA-C   Cholecalciferol (VITAMIN D) 2000 UNITS CAPS Take by mouth daily.    Historical Provider, MD  eletriptan (RELPAX) 40 MG tablet Take 1 tablet (40 mg total) by mouth as needed for migraine or headache. One tablet by mouth at onset of headache. May repeat in 2 hours if headache persists or recurs. 04/11/15   Terressa KoyanagiHannah R Kim, DO  FeFum-FePoly-FA-B Cmp-C-Biot (INTEGRA PLUS) CAPS 1 PO daily for anemia 11/28/15   Amy S Esterwood, PA-C  fluconazole (DIFLUCAN) 150 MG tablet Take 1 tablet (150 mg total) by mouth once. May take [1] tablet in three days after initial dosage 03/20/16   Waldon MerlWilliam C Martin, PA-C  methocarbamol (ROBAXIN) 500 MG tablet Take 500 mg by mouth every 8 (eight) hours as needed for muscle spasms.    Historical Provider, MD  MONONESSA 0.25-35 MG-MCG tablet TK 1 T PO QD 11/17/15   Historical Provider, MD  omeprazole (PRILOSEC) 20 MG capsule TAKE 1 CAPSULE BY MOUTH 2 TIMES DAILY BEFORE A MEAL 01/23/16   Charlie PitterHenry L Danis III, MD  ondansetron (ZOFRAN) 8 MG tablet Take by mouth every 8 (eight) hours as needed for nausea or vomiting.    Historical Provider, MD  oxyCODONE-acetaminophen (PERCOCET) 10-325 MG per tablet Take 1 tablet by mouth every 8 (eight) hours as needed for pain. Take 1 tablet by mouth every 8 hours as needed for severe pain. 04/24/15   Waldon MerlWilliam C Martin, PA-C  vitamin B-12 (CYANOCOBALAMIN) 1000 MCG tablet Take 1,000 mcg by mouth daily.    Historical Provider, MD  Vitamin D, Ergocalciferol, (DRISDOL) 50000 units CAPS capsule Take 1 capsule (50,000 units) by mouth every 7 days for 10 weeks. 01/17/16   Waldon MerlWilliam C Martin, PA-C   BP 112/59 mmHg  Pulse 65  Temp(Src) 98.1 F (36.7 C) (Oral)  Resp 16  Ht 5\' 6"  (1.676 m)  SpO2 100%  LMP 03/06/2016 Physical Exam  Constitutional: She appears well-developed and well-nourished. No distress.  HENT:  Head: Normocephalic and atraumatic.  Mouth/Throat: Oropharynx is clear and moist.  No tongue biting appreciated.   Eyes: Conjunctivae and EOM are normal.  Pupils are equal, round, and reactive to light. Right eye exhibits no discharge. Left eye exhibits no discharge. No scleral icterus.  Neck: Normal range of motion. Neck supple.  Cardiovascular: Normal rate, regular rhythm, normal heart sounds and intact distal pulses.   No murmur heard. Pulmonary/Chest: Effort normal and breath sounds normal. No respiratory distress.  Abdominal: Soft. Bowel sounds are normal. There is no tenderness. There is no rebound and no guarding.  Musculoskeletal: Normal range of motion.  Lymphadenopathy:    She has no cervical adenopathy.  Neurological: She is alert. She is disoriented.  Mental Status:  Alert, oriented  X  1. Not able to provide a coherent history. Speech is fluent without aphasia. Difficulty following commands, mentation is slowed.  Cranial Nerves:  II:  pupils equal, round, reactive to light III,IV, VI: ptosis not present, extra-ocular motions intact bilaterally  V,VII: smile symmetric, facial light touch sensation equal VIII: hearing grossly normal to voice  X: uvula elevates symmetrically  XI: bilateral shoulder shrug symmetric and strong XII: midline tongue extension without fassiculations Motor:  Normal tone. 4/5 in upper extremities bilaterally, decrease in grip strength. Strength 5/5 lower extremities b/l and equal dorsiflexion/plantar flexion.    Sensory: grossly intact to light sensation.  Cerebellar: normal finger-to-nose with bilateral upper extremities Negative pronator drift.  CV: distal pulses palpable throughout     Skin: Skin is warm and dry. She is not diaphoretic.  Psychiatric: Her speech is delayed. She is slowed.    ED Course  Procedures (including critical care time) Labs Review Labs Reviewed  COMPREHENSIVE METABOLIC PANEL - Abnormal; Notable for the following:    Chloride 112 (*)    CO2 21 (*)    Creatinine, Ser 1.09 (*)    All other components within normal limits  URINALYSIS, ROUTINE W REFLEX MICROSCOPIC (NOT AT  [facility]) - Abnormal; Notable for the following:    Glucose, UA 250 (*)    All other components within normal limits  ACETAMINOPHEN LEVEL - Abnormal; Notable for the following:    Acetaminophen (Tylenol), Serum <10 (*)    All other components within normal limits  CBG MONITORING, ED - Abnormal; Notable for the following:    Glucose-Capillary 54 (*)    All other components within normal limits  I-STAT CHEM 8, ED - Abnormal; Notable for the following:    Creatinine, Ser 1.10 (*)    Calcium, Ion 1.26 (*)    All other components within normal limits  CBG MONITORING, ED - Abnormal; Notable for the following:    Glucose-Capillary 101 (*)    All other components within normal limits  CBG MONITORING, ED - Abnormal; Notable for the following:    Glucose-Capillary 103 (*)    All other components within normal limits  CBG MONITORING, ED - Abnormal; Notable for the following:    Glucose-Capillary 47 (*)    All other components within normal limits  CBG MONITORING, ED - Abnormal; Notable for the following:    Glucose-Capillary 111 (*)    All other components within normal limits  PROTIME-INR  APTT  CBC  DIFFERENTIAL  SALICYLATE LEVEL  C-PEPTIDE  PROINSULIN/INSULIN RATIO  BETA-HYDROXYBUTYRIC ACID  SULFONYLUREA HYPOGLYCEMICS PANEL, SERUM  CORTISOL  URINE RAPID DRUG SCREEN, HOSP PERFORMED  FOLATE  VITAMIN B12  HEMOGLOBIN A1C  TSH  CBG MONITORING, ED  I-STAT TROPOININ, ED  I-STAT BETA HCG BLOOD, ED ([facility], [facility], [facility] ONLY)  CBG MONITORING, ED  CBG MONITORING, ED  CBG MONITORING, ED    Imaging Review Ct Head Wo Contrast  03/21/2016  CLINICAL DATA:  Confusion, unsteady gait. EXAM: CT HEAD WITHOUT CONTRAST TECHNIQUE: Contiguous axial images were obtained from the base of the skull through the vertex without intravenous contrast. COMPARISON:  None. FINDINGS: Brain: Ventricles are normal in size and configuration. All areas of the brain demonstrate appropriate gray-white matter differentiation.  There is no mass, hemorrhage, edema or other evidence of acute parenchymal abnormality. No extra-axial hemorrhage. Vascular: No hyperdense vessel or unexpected calcification. Skull: Negative for fracture or focal lesion. Sinuses/Orbits: No acute findings. Other: None. IMPRESSION: Normal head CT. Electronically Signed   By: Bary Richard  M.D.   On: 03/21/2016 12:04   I have personally reviewed and evaluated these images and lab results as part of my medical decision-making.   EKG Interpretation   Date/Time:  Thursday March 21 2016 11:15:57 EDT Ventricular Rate:  83 PR Interval:  144 QRS Duration: 84 QT Interval:  374 QTC Calculation: 439 R Axis:   19 Text Interpretation:  Normal sinus rhythm Normal ECG Normal EKG. No prior  EKg  Confirmed by LIU MD, DANA [id number]) on 03/21/2016 1:03:42 PM      MDM   Final diagnoses:  Altered mental state   Patient is afebile and non-toxic appearing. Her vital signs are stable. Patient and husband are poor historians. On initial evaluation patient hypoglycemic (CBG 54), she was given PO OJ and d50. She became mildly more alert; however, oriented to self only. Neurologic exam showed oriented x 1, slow mentation, and mild decrease in strength in upper extremities b/l. Repeat CBG improved to 103. IVF initiated. Patient discussed with Dr. Verdie Mosher, who also agreed to see patient.   At approximately 2:45, patient again became hypoglycemic with CBG at 47, Dr. Verdie Mosher made aware. D50 given and 5% dextrose drip initiated. UA negative for UTI, pregnancy test negative, troponin negative, no leukocytosis, mild hyperchloremia, PT/INR/APTT normal, EKG NSR. CT head normal. Salicylate and acetaminophen normal. Low suspicion for stroke, infection, or seizure as cause of symptoms.  Unsure etiology of AMS and recurrent hypoglyemia. Consult to [facility] for further evaluation and management.   Consult to [facility], Dr. Randol Kern, appreciated his input. Agrees to see patient.   4:04 PM: per Dr.  Randol Kern, patient to be admitted for further evaluation and management of AMS and hypoglycemia.     Lona Kettle, PA-C 03/21/16 1724  Lavera Guise, MD 03/22/16 [phone]

## 2016-03-21 NOTE — ED Notes (Signed)
This RN went over to MRI to check CBG again before MRI. CGG 94.

## 2016-03-21 NOTE — ED Notes (Signed)
1250 - Pt became lethargic when Heather, EMT entered room to give pt juice. This RN and Chrislyn, RN came to pt room to assess pt. Pt confused as to place, situation, and time. Oriented to self. Morrie SheldonAshley, PA at bedside.  1255- Pt given d50, now more responsive. Pt able to answer questions, remains lethargic, still uncertain as to year/president but oriented to place and situation now.

## 2016-03-21 NOTE — ED Notes (Signed)
Admitting at bedside to evaluate pt.

## 2016-03-21 NOTE — ED Notes (Signed)
Patient states she went to bed last night at 700pm.   Patient went to primary doctor yesterday for UTI and states she was given diflucan.   Patient states that this morning woke up at 0515  And was unsteady.   Husband states patient is unsteady on feet and confused.  Husband states that she has slurred speech, but none appreciated at triage.  Patient appears with delayed speech.   Dr. Verdie MosherLiu called to come to triage to see patient.

## 2016-03-21 NOTE — ED Notes (Signed)
CBG 54. Pt given juice.

## 2016-03-21 NOTE — H&P (Signed)
TRH H&P   Patient Demographics:    Toni [name], is a 40 y.o. female  MRN: [id number]   DOB - 01/25/1976  Admit Date - 03/21/2016  Outpatient Primary MD for the patient is Piedad Climes, PA-C  Referring MD/NP/PA: PA Meyer  Patient coming from: Home  Chief Complaint  Patient presents with  . stroke like symptoms       HPI:    Toni [name]  is a 40 y.o. female, With past medical history of obesity, anxiety, depression, vitamin D and B deficiency, gastric bypass, presents with altered mental status, at bedside, gives most of the history, patient woke up at 5 AM, appears to be confused to husband, lethargic, where she went to do laundry that early, he told her to go back to sleep, she woke up one time, he drove her to work given she was lethargic and confused, and at work patient had an episode of syncope, there was no seizure-like activity, no no urinary or stool incontinence, in ED patient was noticed to be hypoglycemic, recurrent despite D50 pushes and orange juice, so she started on D5 half-normal saline, she is afebrile, no leukocytosis, negative urinalysis, CT head with no acute findings, but this was called to admit.    Review of systems:    In addition to the HPI above No Fever-chills, No Headache, No changes with Vision or hearing, No problems swallowing food or Liquids, No Chest pain, Cough or Shortness of Breath, No Abdominal pain, No Nausea or Vommitting, Bowel movements are regular, No Blood in stool or Urine, No dysuria, No new skin rashes or bruises, No new joints pains-aches,  No new weakness, tingling, numbness in any extremity,Generalized weakness and fatigue No recent weight gain or loss, No polyuria, polydypsia or polyphagia, Patient with confusion and altered mental status  A full 10 point Review of Systems was done, except as  stated above, all other Review of Systems were negative.   With Past History of the following :    Past Medical History  Diagnosis Date  . Syncope     with ? seizure in 2015, eval with neruo  . Migraine   . GERD (gastroesophageal reflux disease)     hx gastric ulcer and upper GI bleed, ? NSAID induced  . Anxiety and depression     started with PPD  . Allergy   . Hypertension   . Bacterial vaginitis   . Vitamin D deficiency   . Vitamin B12 deficiency   . Chronic neck pain     sees [facility] for neck strain  . Seizure (HCC)     s/p eval with neuro, no medication, no events since      Past Surgical History  Procedure Laterality Date  . Gastric bypass  2002  . Cesarean section  2006  . Tubuligation  2006  . Tonsillectomy and adenoidectomy    .  Tubes in ears both Bilateral   . Eye surgery Bilateral     2001  . Gastric bypass    . Endometrial biopsy        Social History:     Social History  Substance Use Topics  . Smoking status: Never Smoker   . Smokeless tobacco: Never Used  . Alcohol Use: 0.0 oz/week    0 Standard drinks or equivalent per week     Comment: occasional      Lives - At home  Mobility - independent     Family History :     Family History  Problem Relation Age of Onset  . Arthritis Mother   . Aneurysm Father     deceased  . Lung cancer Maternal Aunt   . Hyperlipidemia Mother   . Heart disease Mother   . Diabetes Mother   . Asthma Mother      Home Medications:   Prior to Admission medications   Medication Sig Start Date End Date Taking? Authorizing Provider  alprazolam Prudy Feeler(XANAX) 2 MG tablet Take 0.5 tablets (1 mg total) by mouth 3 (three) times daily as needed for anxiety. 03/15/16   Waldon MerlWilliam C Martin, PA-C  amitriptyline (ELAVIL) 25 MG tablet Take 1 tablet (25 mg total) by mouth at bedtime. 02/13/16   Waldon MerlWilliam C Martin, PA-C  BIOTIN PO Take by mouth daily.    Historical Provider, MD  Calcium 250 MG CAPS Take by mouth daily.     Historical Provider, MD  cetirizine (ZYRTEC) 10 MG tablet Take 1 tablet (10 mg total) by mouth daily. 11/24/15   Waldon MerlWilliam C Martin, PA-C  Cholecalciferol (VITAMIN D) 2000 UNITS CAPS Take by mouth daily.    Historical Provider, MD  eletriptan (RELPAX) 40 MG tablet Take 1 tablet (40 mg total) by mouth as needed for migraine or headache. One tablet by mouth at onset of headache. May repeat in 2 hours if headache persists or recurs. 04/11/15   Terressa KoyanagiHannah R Kim, DO  FeFum-FePoly-FA-B Cmp-C-Biot (INTEGRA PLUS) CAPS 1 PO daily for anemia 11/28/15   Amy S Esterwood, PA-C  fluconazole (DIFLUCAN) 150 MG tablet Take 1 tablet (150 mg total) by mouth once. May take [1] tablet in three days after initial dosage 03/20/16   Waldon MerlWilliam C Martin, PA-C  methocarbamol (ROBAXIN) 500 MG tablet Take 500 mg by mouth every 8 (eight) hours as needed for muscle spasms.    Historical Provider, MD  MONONESSA 0.25-35 MG-MCG tablet TK 1 T PO QD 11/17/15   Historical Provider, MD  omeprazole (PRILOSEC) 20 MG capsule TAKE 1 CAPSULE BY MOUTH 2 TIMES DAILY BEFORE A MEAL 01/23/16   Charlie PitterHenry L Danis III, MD  ondansetron (ZOFRAN) 8 MG tablet Take by mouth every 8 (eight) hours as needed for nausea or vomiting.    Historical Provider, MD  oxyCODONE-acetaminophen (PERCOCET) 10-325 MG per tablet Take 1 tablet by mouth every 8 (eight) hours as needed for pain. Take 1 tablet by mouth every 8 hours as needed for severe pain. 04/24/15   Waldon MerlWilliam C Martin, PA-C  vitamin B-12 (CYANOCOBALAMIN) 1000 MCG tablet Take 1,000 mcg by mouth daily.    Historical Provider, MD  Vitamin D, Ergocalciferol, (DRISDOL) 50000 units CAPS capsule Take 1 capsule (50,000 units) by mouth every 7 days for 10 weeks. 01/17/16   Waldon MerlWilliam C Martin, PA-C     Allergies:     Allergies  Allergen Reactions  . Ibuprofen     Causes ulcers     Physical Exam:  Vitals  Blood pressure 112/59, pulse 65, temperature 98.1 F (36.7 C), temperature source Oral, resp. rate 16, height   (1.676 m), last menstrual period 03/06/2016, SpO2 100 %.   1. General Well-developed female lying in bed in NAD,   2. There is to be having poor cognition, slow to respond, confused, Not Suicidal or Homicidal, Awake Alert, Oriented X 3 (month and year but not date).  3. No F.N deficits, ALL C.Nerves Intact, Strength 5/5 all 4 extremities, Sensation intact all 4 extremities, Plantars down going.  4. Ears and Eyes appear Normal, Conjunctivae clear, PERRLA. Moist Oral Mucosa.  5. Supple Neck, No JVD, No cervical lymphadenopathy appriciated, No Carotid Bruits.  6. Symmetrical Chest wall movement, Good air movement bilaterally, CTAB.  7. RRR, No Gallops, Rubs or Murmurs, No Parasternal Heave.  8. Positive Bowel Sounds, Abdomen Soft, No tenderness, No organomegaly appriciated,No rebound -guarding or rigidity.  9.  No Cyanosis, Normal Skin Turgor, No Skin Rash or Bruise.  10. Good muscle tone,  joints appear normal , no effusions, Normal ROM.  11. No Palpable Lymph Nodes in Neck or Axillae     Data Review:    CBC  Recent Labs Lab 03/21/16 1132 03/21/16 1138  WBC 8.5  --   HGB 12.7 13.6  HCT 39.8 40.0  PLT 338  --   MCV 86.0  --   MCH 27.4  --   MCHC 31.9  --   RDW 14.5  --   LYMPHSABS 3.1  --   MONOABS 0.7  --   EOSABS 0.0  --   BASOSABS 0.0  --    ------------------------------------------------------------------------------------------------------------------  Chemistries   Recent Labs Lab 03/21/16 1132 03/21/16 1138  NA 140 143  K 4.1 4.1  CL 112* 107  CO2 21*  --   GLUCOSE 90 86  BUN 11 13  CREATININE 1.09* 1.10*  CALCIUM 9.3  --   AST 17  --   ALT 14  --   ALKPHOS 54  --   BILITOT 0.3  --    ------------------------------------------------------------------------------------------------------------------ estimated creatinine clearance is 89.7 mL/min (by C-G formula based on Cr of  1.1). ------------------------------------------------------------------------------------------------------------------ No results for input(s): TSH, T4TOTAL, T3FREE, THYROIDAB in the last 72 hours.  Invalid input(s): FREET3  Coagulation profile  Recent Labs Lab 03/21/16 1132  INR 1.11   ------------------------------------------------------------------------------------------------------------------- No results for input(s): DDIMER in the last 72 hours. -------------------------------------------------------------------------------------------------------------------  Cardiac Enzymes No results for input(s): CKMB, TROPONINI, MYOGLOBIN in the last 168 hours.  Invalid input(s): CK ------------------------------------------------------------------------------------------------------------------ No results found for: BNP   ---------------------------------------------------------------------------------------------------------------  Urinalysis    Component Value Date/Time   COLORURINE YELLOW 03/21/2016 1350   APPEARANCEUR CLEAR 03/21/2016 1350   LABSPEC 1.012 03/21/2016 1350   PHURINE 5.5 03/21/2016 1350   GLUCOSEU 250* 03/21/2016 1350   GLUCOSEU NEGATIVE 01/16/2016 0706   HGBUR NEGATIVE 03/21/2016 1350   BILIRUBINUR NEGATIVE 03/21/2016 1350   BILIRUBINUR neg 03/20/2016 1426   KETONESUR NEGATIVE 03/21/2016 1350   PROTEINUR NEGATIVE 03/21/2016 1350   PROTEINUR neg 03/20/2016 1426   UROBILINOGEN 0.2 03/20/2016 1426   UROBILINOGEN 0.2 01/16/2016 0706   NITRITE NEGATIVE 03/21/2016 1350   NITRITE neg 03/20/2016 1426   LEUKOCYTESUR NEGATIVE 03/21/2016 1350    ----------------------------------------------------------------------------------------------------------------   Imaging Results:    Ct Head Wo Contrast  03/21/2016  CLINICAL DATA:  Confusion, unsteady gait. EXAM: CT HEAD WITHOUT CONTRAST TECHNIQUE: Contiguous axial images were obtained from the base of the skull  through the vertex without intravenous  contrast. COMPARISON:  None. FINDINGS: Brain: Ventricles are normal in size and configuration. All areas of the brain demonstrate appropriate gray-white matter differentiation. There is no mass, hemorrhage, edema or other evidence of acute parenchymal abnormality. No extra-axial hemorrhage. Vascular: No hyperdense vessel or unexpected calcification. Skull: Negative for fracture or focal lesion. Sinuses/Orbits: No acute findings. Other: None. IMPRESSION: Normal head CT. Electronically Signed   By: Bary Richard M.D.   On: 03/21/2016 12:04    My personal review of EKG: Rhythm NSR, Rate  83 /min, QTc 439 , no Acute ST changes   Assessment & Plan:    Active Problems:   Esophageal reflux   Anxiety and depression   Vitamin D deficiency   Vitamin B deficiency   Hypoglycemia   Encephalopathy acute  Acute encephalopathy - This is most likely in the setting of hypoglycemia, Patient and husband at bedside she took extra Percocet or Xanax, CT head with no acute finding, I'll check MRI brain.  Hypoglycemia - Unclear etiology, she denies taking any herbal supplements, or medication to lose weight, denies taking any oral hypoglycemic agents, will check insulin, proinsulin, C-peptide, sulfonylureas, TSH, folic acid, B-12, and cortisol level. - Continue with D5 half-normal saline, with CBG monitoring every 2 hours.  Vitamin D and B deficiency - Continue supplement  GERD - Continue with PPI  Anxiety and depression - We will resume on Xanax lower dose to prevent withdrawals    DVT Prophylaxis Heparin   AM Labs Ordered, also please review Full Orders  Family Communication: Admission, patients condition and plan of care including tests being ordered have been discussed with the patient and Husband who indicate understanding and agree with the plan and Code Status.  Code Status Full  Likely DC to  Home  Condition GUARDED    Consults called:  none  Admission status: Observation  Time spent in minutes : 55 minutes   Laniesha Das M.D on 03/21/2016 at 4:27 PM  Between 7am to 7pm - Pager - [phone]. After 7pm go to [email or web address] - password [facility]  [facility] - Office  [phone]

## 2016-03-21 NOTE — ED Notes (Signed)
Pt now reporting that she passed out at work this morning. States she went out to car and then when she went back inside of building, she was in a chair and coworkers were surrounding her.

## 2016-03-21 NOTE — ED Notes (Signed)
Pt found to be more lethargic/confused from baseline since coming toED when assessing neuro status. This RN checked CBG, found to be 47. Dr. Verdie MosherLiu made aware and at bedside. Amp d50 given, started on 5% dextrose drip.

## 2016-03-21 NOTE — ED Notes (Addendum)
Gave pt apple juice per Chrislyn-RN.

## 2016-03-21 NOTE — ED Notes (Signed)
Pt's CBG result was 103. Informed Brooke - Charity fundraiserN.

## 2016-03-21 NOTE — ED Notes (Addendum)
Pt's CBG Result was 54. Informed Brooke-RN.

## 2016-03-22 ENCOUNTER — Observation Stay (HOSPITAL_COMMUNITY): Payer: 59

## 2016-03-22 DIAGNOSIS — Z9884 Bariatric surgery status: Secondary | ICD-10-CM | POA: Diagnosis not present

## 2016-03-22 DIAGNOSIS — E669 Obesity, unspecified: Secondary | ICD-10-CM | POA: Diagnosis not present

## 2016-03-22 DIAGNOSIS — E559 Vitamin D deficiency, unspecified: Secondary | ICD-10-CM | POA: Diagnosis not present

## 2016-03-22 DIAGNOSIS — G9340 Encephalopathy, unspecified: Secondary | ICD-10-CM

## 2016-03-22 DIAGNOSIS — F329 Major depressive disorder, single episode, unspecified: Secondary | ICD-10-CM | POA: Diagnosis not present

## 2016-03-22 DIAGNOSIS — R569 Unspecified convulsions: Secondary | ICD-10-CM

## 2016-03-22 DIAGNOSIS — F419 Anxiety disorder, unspecified: Secondary | ICD-10-CM | POA: Diagnosis not present

## 2016-03-22 DIAGNOSIS — E162 Hypoglycemia, unspecified: Principal | ICD-10-CM

## 2016-03-22 DIAGNOSIS — K219 Gastro-esophageal reflux disease without esophagitis: Secondary | ICD-10-CM | POA: Diagnosis not present

## 2016-03-22 DIAGNOSIS — E539 Vitamin B deficiency, unspecified: Secondary | ICD-10-CM | POA: Diagnosis not present

## 2016-03-22 LAB — GLUCOSE, CAPILLARY
GLUCOSE-CAPILLARY: 112 mg/dL — AB (ref 65–99)
GLUCOSE-CAPILLARY: 80 mg/dL (ref 65–99)
GLUCOSE-CAPILLARY: 83 mg/dL (ref 65–99)
GLUCOSE-CAPILLARY: 90 mg/dL (ref 65–99)
GLUCOSE-CAPILLARY: 95 mg/dL (ref 65–99)
GLUCOSE-CAPILLARY: 96 mg/dL (ref 65–99)
Glucose-Capillary: 136 mg/dL — ABNORMAL HIGH (ref 65–99)
Glucose-Capillary: 72 mg/dL (ref 65–99)
Glucose-Capillary: 84 mg/dL (ref 65–99)
Glucose-Capillary: 84 mg/dL (ref 65–99)
Glucose-Capillary: 86 mg/dL (ref 65–99)
Glucose-Capillary: 94 mg/dL (ref 65–99)

## 2016-03-22 LAB — CBC
HCT: 37.7 % (ref 36.0–46.0)
HEMOGLOBIN: 11.8 g/dL — AB (ref 12.0–15.0)
MCH: 27.1 pg (ref 26.0–34.0)
MCHC: 31.3 g/dL (ref 30.0–36.0)
MCV: 86.5 fL (ref 78.0–100.0)
PLATELETS: 300 10*3/uL (ref 150–400)
RBC: 4.36 MIL/uL (ref 3.87–5.11)
RDW: 14.7 % (ref 11.5–15.5)
WBC: 6.4 10*3/uL (ref 4.0–10.5)

## 2016-03-22 LAB — BASIC METABOLIC PANEL
ANION GAP: 7 (ref 5–15)
BUN: 8 mg/dL (ref 6–20)
CALCIUM: 8.5 mg/dL — AB (ref 8.9–10.3)
CO2: 21 mmol/L — ABNORMAL LOW (ref 22–32)
CREATININE: 0.95 mg/dL (ref 0.44–1.00)
Chloride: 113 mmol/L — ABNORMAL HIGH (ref 101–111)
Glucose, Bld: 77 mg/dL (ref 65–99)
Potassium: 3.8 mmol/L (ref 3.5–5.1)
SODIUM: 141 mmol/L (ref 135–145)

## 2016-03-22 LAB — URINE CYTOLOGY ANCILLARY ONLY
Chlamydia: NEGATIVE
NEISSERIA GONORRHEA: NEGATIVE
TRICH (WINDOWPATH): NEGATIVE

## 2016-03-22 LAB — C-PEPTIDE: C PEPTIDE: 2.6 ng/mL (ref 1.1–4.4)

## 2016-03-22 LAB — HEMOGLOBIN A1C
HEMOGLOBIN A1C: 5.2 % (ref 4.8–5.6)
MEAN PLASMA GLUCOSE: 103 mg/dL

## 2016-03-22 LAB — VITAMIN B12: VITAMIN B 12: 760 pg/mL (ref 180–914)

## 2016-03-22 LAB — FOLATE: FOLATE: 17 ng/mL (ref >5.9)

## 2016-03-22 MED ORDER — OXYCODONE-ACETAMINOPHEN 5-325 MG PO TABS
1.0000 | ORAL_TABLET | Freq: Two times a day (BID) | ORAL | Status: DC | PRN
Start: 2016-03-22 — End: 2016-03-24
  Administered 2016-03-22 – 2016-03-24 (×6): 1 via ORAL
  Filled 2016-03-22 (×6): qty 1

## 2016-03-22 NOTE — Consult Note (Signed)
NEURO HOSPITALIST CONSULT NOTE   Requestig physician: Dr. Ella Jubilee  Reason for Consult: Possible seizure  History obtained from:  Patient     HPI:                                                                                                                                          Toni [name] is an 40 y.o. female With past medical history of obesity, anxiety, depression, vitamin D and B12 deficiency, gastric bypass, presents with altered mental status. Patient woke up at 5 AM, appeared to be confused to husband, lethargic.  Drove her to work given that she was lethargic and confused. At work patient had an episode of syncope, there was no seizure-like activity, no urinary or stool incontinence. After being transported to the ED, patient was noticed to be hypoglycemic. CBG was 54 but increased to 103 with D50.   In 2015 she was seen by Dr. Terrace Arabia from [facility]. Per notes: She had suffered a seizure in June 18, 2014; she had driven overnight to help her cousin in the morning; early morning, while moving boxes to [employer], she felt dizziness, lightheadedness, woke up in the ambulance; this was in [place]. This spell/seizure was witnessed by her fianc, who saw her fall to the floor with whole-body jerking movement lasting for a few minutes; there was bilateral tongue biting, urinary incontinence, followed by post-event confusion. She had a normal EEG and MRI at that time. She was not placed on an AED at that time.   Per husband, she had no jerking or other motor activity with the current spell, was just was staring at the computer screen and was very slow to respond with some slurred speech. Currently, she is slow to respond and appears lethargic. She admits to be under significant stress at work and being sleep-deprived. She was on 1 mg Xanax and had recently (within the last 2 days) taken this together with Robaxin.    Past Medical History  Diagnosis Date  . Syncope      with ? seizure in 2015, eval with neruo  . Migraine   . GERD (gastroesophageal reflux disease)     hx gastric ulcer and upper GI bleed, ? NSAID induced  . Anxiety and depression     started with PPD  . Allergy   . Hypertension   . Bacterial vaginitis   . Vitamin D deficiency   . Vitamin B12 deficiency   . Chronic neck pain     sees [place] [facility] for neck strain  . Seizure (HCC)     s/p eval with neuro, no medication, no events since    Past Surgical History  Procedure Laterality Date  . Gastric bypass  2002  . Cesarean section  2006  .  Tubuligation  2006  . Tonsillectomy and adenoidectomy    . Tubes in ears both Bilateral   . Eye surgery Bilateral     2001  . Gastric bypass    . Endometrial biopsy      Family History  Problem Relation Age of Onset  . Arthritis Mother   . Aneurysm Father     deceased  . Lung cancer Maternal Aunt   . Hyperlipidemia Mother   . Heart disease Mother   . Diabetes Mother   . Asthma Mother      Social History:  reports that she has never smoked. She has never used smokeless tobacco. She reports that she drinks alcohol. She reports that she does not use illicit drugs.  Allergies  Allergen Reactions  . Ibuprofen     Causes ulcers    MEDICATIONS:                                                                                                                     Prior to Admission:  Prescriptions prior to admission  Medication Sig Dispense Refill Last Dose  . acetaminophen (TYLENOL) 500 MG tablet Take 1,000 mg by mouth every 6 (six) hours as needed for mild pain, moderate pain or headache.   Past Month at Unknown time  . alprazolam (XANAX) 2 MG tablet Take 0.5 tablets (1 mg total) by mouth 3 (three) times daily as needed for anxiety. 90 tablet 0 Past Month at Unknown time  . amitriptyline (ELAVIL) 25 MG tablet Take 1 tablet (25 mg total) by mouth at bedtime. 30 tablet 5 03/20/2016 at pm  . BIOTIN PO Take 1 tablet by mouth daily.     Past Week at Unknown time  . Calcium 250 MG CAPS Take 1 capsule by mouth daily.    Past Week at Unknown time  . Cholecalciferol (VITAMIN D) 2000 UNITS CAPS Take 1 capsule by mouth daily.    03/21/2016 at am  . CRANBERRY EXTRACT PO Take 1 capsule by mouth daily as needed (for symptoms).   Past Month at Unknown time  . FeFum-FePoly-FA-B Cmp-C-Biot (INTEGRA PLUS) CAPS 1 PO daily for anemia (Patient taking differently: Take 1 capsule by mouth daily. For anemia) 30 capsule 3 03/21/2016 at am  . MONONESSA 0.25-35 MG-MCG tablet Take 1 tablet by mouth once daily  4 03/21/2016 at am  . omeprazole (PRILOSEC) 20 MG capsule TAKE 1 CAPSULE BY MOUTH 2 TIMES DAILY BEFORE A MEAL 60 capsule 11 03/21/2016 at am  . ondansetron (ZOFRAN) 8 MG tablet Take by mouth every 8 (eight) hours as needed for nausea or vomiting.   Past Month at Unknown time  . oxyCODONE-acetaminophen (PERCOCET) 10-325 MG per tablet Take 1 tablet by mouth every 8 (eight) hours as needed for pain. Take 1 tablet by mouth every 8 hours as needed for severe pain. (Patient taking differently: Take 1 tablet by mouth every 8 (eight) hours as needed for pain. ) 15 tablet 0 03/18/2016 at am  .  vitamin B-12 (CYANOCOBALAMIN) 1000 MCG tablet Take 1,000 mcg by mouth daily.   03/21/2016 at am  . Vitamin D, Ergocalciferol, (DRISDOL) 50000 units CAPS capsule Take 1 capsule (50,000 units) by mouth every 7 days for 10 weeks. 10 capsule 0 03/14/2016 at am  . cetirizine (ZYRTEC) 10 MG tablet Take 1 tablet (10 mg total) by mouth daily. (Patient not taking: Reported on 03/21/2016) 30 tablet 3 Not Taking at Unknown time  . eletriptan (RELPAX) 40 MG tablet Take 1 tablet (40 mg total) by mouth as needed for migraine or headache. One tablet by mouth at onset of headache. May repeat in 2 hours if headache persists or recurs. 10 tablet 3 Taking   Scheduled: . amitriptyline  25 mg Oral QHS  . calcium carbonate  1 tablet Oral Q breakfast  . cholecalciferol  2,000 Units Oral Daily  .  heparin  5,000 Units Subcutaneous Q8H  . pantoprazole  40 mg Oral Daily  . sodium chloride  1,000 mL Intravenous Once  . sodium chloride  1,000 mL Intravenous Once  . vitamin B-12  1,000 mcg Oral Daily     ROS:                                                                                                                                       History obtained from the patient  General ROS: negative for - chills, fatigue, fever, night sweats, weight gain or weight loss Psychological ROS: negative for - behavioral disorder, hallucinations, memory difficulties, mood swings or suicidal ideation Ophthalmic ROS: negative for - blurry vision, double vision, eye pain or loss of vision ENT ROS: negative for - epistaxis, nasal discharge, oral lesions, sore throat, tinnitus or vertigo Allergy and Immunology ROS: negative for - hives or itchy/watery eyes Hematological and Lymphatic ROS: negative for - bleeding problems, bruising or swollen lymph nodes Endocrine ROS: negative for - galactorrhea, hair pattern changes, polydipsia/polyuria or temperature intolerance Respiratory ROS: negative for - cough, hemoptysis, shortness of breath or wheezing Cardiovascular ROS: negative for - chest pain, dyspnea on exertion, edema or irregular heartbeat Gastrointestinal ROS: negative for - abdominal pain, diarrhea, hematemesis, nausea/vomiting or stool incontinence Genito-Urinary ROS: negative for - dysuria, hematuria, incontinence or urinary frequency/urgency Musculoskeletal ROS: negative for - joint swelling or muscular weakness Neurological ROS: as noted in HPI Dermatological ROS: negative for rash and skin lesion changes   Blood pressure 95/58, pulse 59, temperature 98 F (36.7 C), temperature source Oral, resp. rate 27, height 5\' 6"  (1.676 m), weight 117.9 kg (259 lb 14.8 oz), last menstrual period 03/06/2016, SpO2 99 %.   Neurologic Examination:  HEENT-  Normocephalic, no lesions, without obvious abnormality.  Normal external eye and conjunctiva.  Normal TM's bilaterally.  Normal auditory canals and external ears. Normal external nose, mucus membranes and septum.  Normal pharynx. Cardiovascular- S1, S2 normal, pulses palpable throughout   Lungs- chest clear, no wheezing, rales, normal symmetric air entry Abdomen- normal findings: bowel sounds normal Extremities- no edema Lymph-no adenopathy palpable Musculoskeletal-no joint tenderness, deformity or swelling Skin-warm and dry, no hyperpigmentation, vitiligo, or suspicious lesions  Neurological Examination Mental Status: Drowsy. Oriented, thought content appropriate.  Speech slow but fluent without evidence of aphasia.  Able to follow 3 step commands without difficulty. Cranial Nerves: II: Visual fields grossly normal, pupils equal, round, reactive to light and accommodation III,IV, VI: ptosis not present, extra-ocular motions intact bilaterally V,VII: smile symmetric, facial light touch sensation normal bilaterally VIII: hearing normal bilaterally IX,X: uvula rises symmetrically XI: bilateral shoulder shrug XII: midline tongue extension Motor: 4/5 throughout in the context of diffuse fatigue.  Sensory: Pinprick and light touch intact throughout, bilaterally, vibration, proprioception and temperature were intact.  Deep Tendon Reflexes: 1+ and symmetric throughout Plantars: Right: downgoing   Left: downgoing Cerebellar: Normal finger-to-nose normal heel-to-shin test Gait: Normal but slow  Lab Results: Basic Metabolic Panel:  Recent Labs Lab 03/21/16 1132 03/21/16 1138 03/22/16 0315  NA 140 143 141  K 4.1 4.1 3.8  CL 112* 107 113*  CO2 21*  --  21*  GLUCOSE 90 86 77  BUN 11 13 8   CREATININE 1.09* 1.10* 0.95  CALCIUM 9.3  --  8.5*    Liver Function Tests:  Recent Labs Lab 03/21/16 1132  AST 17  ALT 14  ALKPHOS 54  BILITOT  0.3  PROT 6.6  ALBUMIN 3.5   No results for input(s): LIPASE, AMYLASE in the last 168 hours. No results for input(s): AMMONIA in the last 168 hours.  CBC:  Recent Labs Lab 03/21/16 1132 03/21/16 1138 03/22/16 0315  WBC 8.5  --  6.4  NEUTROABS 4.7  --   --   HGB 12.7 13.6 11.8*  HCT 39.8 40.0 37.7  MCV 86.0  --  86.5  PLT 338  --  300    Cardiac Enzymes: No results for input(s): CKTOTAL, CKMB, CKMBINDEX, TROPONINI in the last 168 hours.  Lipid Panel: No results for input(s): CHOL, TRIG, HDL, CHOLHDL, VLDL, LDLCALC in the last 168 hours.  CBG:  Recent Labs Lab 03/21/16 2144 03/21/16 2355 03/22/16 0238 03/22/16 0436 03/22/16 0734  GLUCAP 94 95 84 84 72    Microbiology: Results for orders placed or performed during the hospital encounter of 03/21/16  MRSA PCR Screening     Status: None   Collection Time: 03/21/16  8:04 PM  Result Value Ref Range Status   MRSA by PCR NEGATIVE NEGATIVE Final    Comment:        The GeneXpert MRSA Assay (FDA approved for NASAL specimens only), is one component of a comprehensive MRSA colonization surveillance program. It is not intended to diagnose MRSA infection nor to guide or monitor treatment for MRSA infections.     Coagulation Studies:  Recent Labs  03/21/16 1132  LABPROT 14.5  INR 1.11    Imaging: Ct Head Wo Contrast  03/21/2016  CLINICAL DATA:  Confusion, unsteady gait. EXAM: CT HEAD WITHOUT CONTRAST TECHNIQUE: Contiguous axial images were obtained from the base of the skull through the vertex without intravenous contrast. COMPARISON:  None. FINDINGS: Brain: Ventricles are normal in size and configuration. All areas  of the brain demonstrate appropriate gray-white matter differentiation. There is no mass, hemorrhage, edema or other evidence of acute parenchymal abnormality. No extra-axial hemorrhage. Vascular: No hyperdense vessel or unexpected calcification. Skull: Negative for fracture or focal lesion.  Sinuses/Orbits: No acute findings. Other: None. IMPRESSION: Normal head CT. Electronically Signed   By: Bary Richard M.D.   On: 03/21/2016 12:04   Mr Brain Wo Contrast  03/21/2016  CLINICAL DATA:  Altered mental status. EXAM: MRI HEAD WITHOUT CONTRAST TECHNIQUE: Multiplanar, multiecho pulse sequences of the brain and surrounding structures were obtained without intravenous contrast. COMPARISON:  CT head 03/21/2016 FINDINGS: Ventricle size normal.  Cerebral volume normal. Negative for acute or chronic infarction Negative for demyelinating disease. Cerebral white matter normal. Brainstem normal Negative for hemorrhage or fluid collection Negative for mass or edema.  No shift of the midline structures. Paranasal sinuses are normal.  Orbital contents normal. Pituitary and skull base normal. IMPRESSION: Within normal limits. Electronically Signed   By: Marlan Palau M.D.   On: 03/21/2016 19:07   History and examination documented by Felicie Morn PA-C, [facility], [phone]  Assessment: 1. 40 YO female with episode of staring and slow to respond in the setting of hypoglycemia. At this time she remains slow to respond but shows no neurological abnormalities on exam other than diffuse motor weakness, possibly secondary to fatigue. It is unclear what the etiology of her confusion was. Hypoglycemia could definitely cause her confusion; however, her glucose has been corrected and she remains slow to respond.Upon further interviewing of patient and husband, it was noted she took both Robaxin and Xanax at the same time which she has not done before; per literature search, this combination can cause CNS depression which could have resulted in the presentation described above. Per husband, she has had some improvement since recent admission, but still is not back to baseline.   2. CT of head and MRI of brain are normal.  2. History of gastric bypass. Exam reveals preserved vibration sensation distally,  with normal distal temperature sensation and no spasticity.  However, she is diffusely weak on exam, suggesting possible intrinsic motor deficit versus fatigue. May have a nutrient or vitamin-deficiency syndrome, which should be further investigated with serum vitamin panel.   Recommendations: 1. EEG.  2. Vitamin vitamin panel to include vitamin E, B12, B6, thiamine and vitamin D levels. A serum copper level should also be obtained. 3. Would avoid further concomitant use of Robaxin and Xanax.   03/22/2016, 9:44 AM

## 2016-03-22 NOTE — Progress Notes (Deleted)
EEG completed, results pending. 

## 2016-03-22 NOTE — Progress Notes (Signed)
PROGRESS NOTE    Toni [name]  ZOX:[id number] DOB: November 02, 1975 DOA: 03/21/2016 PCP: Piedad Climes, PA-C (Confirm with patient/family/NH records and if not entered, this HAS to be entered at [facility] point of entry. "No PCP" if truly none.)   Brief Narrative: (Start on day 1 of progress note - keep it brief and live) Hypoglycemia with altered mental status, 40 yo non diabetic with remote hx of seizures, Workup in progress, neurology consulted for possible seizures.   Assessment & Plan:   Active Problems:   Esophageal reflux   Anxiety and depression   Vitamin D deficiency   Vitamin B deficiency   Hypoglycemia   Encephalopathy acute   1. Cardiovascular. No signs of systemic infection, will continue supportive care, blood pressure systolic 116 and 94. Will continue gentle hydration with saline.  2, Pulmonary no signs of aspiration or pulmonary infection, will continue oxymetry monitor and supplemental 02 per [place].  3, Nephrology renal function stable, follow renal panel in am, will continue d51/2 saline at 50 cc/hr/  4. Neurology, Continue dextrose in IV, patient had seizures in the past and clinical presentation suggestive recurrent seizure,. Will follow with neurology for further recommendations. Continue neuro checks.    DVT prophylaxis: (Lovenox/Heparin/SCD's/anticoagulated/None (if comfort care) Code Status: (Full/Partial - specify details) Family Communication: (Specify name, relationship & date discussed. NO "discussed with patient") Disposition Plan: (specify when and where you expect patient to be discharged). Include barriers to DC in this tab.   Consultants:   neurolgy  Procedures: (Don't include imaging studies which can be auto populated. Include things that cannot be auto populated i.e. Echo, Carotid and venous dopplers, Foley, Bipap, HD, tubes/drains, wound vac, central lines etc)    Antimicrobials: (specify start and planned stop date. Auto populated  tables are space occupying and do not give end dates)     Subjective: This am patient more awake and alert, back to her baseline per her husband at the bedside. Patient had seizure in the past, not on medications. Early morning on 6/16 patient was noted to be confused and disorientated (5 am). At 9 am, had episode of staring her computer monitor for 45 minutes then developed significant confusion. Patient does not recall the events in detail, information from her husband at the bedside.   Objective: Filed Vitals:   03/22/16 0000 03/22/16 0200 03/22/16 0400 03/22/16 0738  BP: 104/62 99/59 105/69 95/58  Pulse: 70 67 64 59  Temp:   97.3 F (36.3 C) 98 F (36.7 C)  TempSrc:   Oral Oral  Resp: 29 30 28 27   Height:      Weight:      SpO2: 98% 97% 97% 99%    Intake/Output Summary (Last 24 hours) at 03/22/16 0747 Last data filed at 03/22/16 0000  Gross per 24 hour  Intake  222.5 ml  Output    550 ml  Net -327.5 ml   Filed Weights   03/21/16 1900  Weight: 117.9 kg (259 lb 14.8 oz)    Examination:  General exam: deconditioned  E ENT: conjunctivae mild pale but not icterus, oral mucosa moist.  Respiratory system: . Respiratory effort normal. No rales, wheezing or rhonchi Cardiovascular system: S1 & S2 heard, RRR. No JVD, murmurs, rubs, gallops or clicks. No pedal edema. Gastrointestinal system: Abdomen is nondistended, soft and nontender. No organomegaly or masses felt. Normal bowel sounds heard. Central nervous system: Alert and oriented. No focal neurological deficits. Extremities: Symmetric 5 x 5 power. Skin: No rashes, lesions  or ulcers Psychiatry: Judgement and insight appear normal. Mood & affect appropriate.     Data Reviewed: I have personally reviewed following labs and imaging studies  CBC:  Recent Labs Lab 03/21/16 1132 03/21/16 1138 03/22/16 0315  WBC 8.5  --  6.4  NEUTROABS 4.7  --   --   HGB 12.7 13.6 11.8*  HCT 39.8 40.0 37.7  MCV 86.0  --  86.5  PLT  338  --  300   Basic Metabolic Panel:  Recent Labs Lab 03/21/16 1132 03/21/16 1138 03/22/16 0315  NA 140 143 141  K 4.1 4.1 3.8  CL 112* 107 113*  CO2 21*  --  21*  GLUCOSE 90 86 77  BUN 11 13 8   CREATININE 1.09* 1.10* 0.95  CALCIUM 9.3  --  8.5*   GFR: Estimated Creatinine Clearance: 102.8 mL/min (by C-G formula based on Cr of 0.95). Liver Function Tests:  Recent Labs Lab 03/21/16 1132  AST 17  ALT 14  ALKPHOS 54  BILITOT 0.3  PROT 6.6  ALBUMIN 3.5   No results for input(s): LIPASE, AMYLASE in the last 168 hours. No results for input(s): AMMONIA in the last 168 hours. Coagulation Profile:  Recent Labs Lab 03/21/16 1132  INR 1.11   Cardiac Enzymes: No results for input(s): CKTOTAL, CKMB, CKMBINDEX, TROPONINI in the last 168 hours. BNP (last 3 results) No results for input(s): PROBNP in the last 8760 hours. HbA1C:  Recent Labs  03/21/16 1700  HGBA1C 5.2   CBG:  Recent Labs Lab 03/21/16 1922 03/21/16 2144 03/21/16 2355 03/22/16 0238 03/22/16 0436  GLUCAP 79 94 95 84 84   Lipid Profile: No results for input(s): CHOL, HDL, LDLCALC, TRIG, CHOLHDL, LDLDIRECT in the last 72 hours. Thyroid Function Tests:  Recent Labs  03/21/16 2118  TSH 1.596   Anemia Panel:  Recent Labs  03/21/16 1700  VITAMINB12 754  FOLATE 16.1   Sepsis Labs: No results for input(s): PROCALCITON, LATICACIDVEN in the last 168 hours.  Recent Results (from the past 240 hour(s))  N. gonorrhoeae, RNA     Status: None   Collection Time: 03/20/16  3:07 PM  Result Value Ref Range Status   GC Probe RNA NOT DETECTED  Final    Comment:                    **Normal Reference Range: NOT DETECTED**   This test was performed using the APTIMA COMBO2 Assay ([employer]   The analytical performance characteristics of this assay, when used to test SurePath specimens have been determined by [facility]     Urine culture     Status: None   Collection Time: 03/20/16   3:16 PM  Result Value Ref Range Status   Colony Count 25,000 COLONIES/ML  Final   Organism ID, Bacteria Multiple bacterial morphotypes present, none  Final   Organism ID, Bacteria predominant. Suggest appropriate recollection if   Final   Organism ID, Bacteria clinically indicated.  Final  MRSA PCR Screening     Status: None   Collection Time: 03/21/16  8:04 PM  Result Value Ref Range Status   MRSA by PCR NEGATIVE NEGATIVE Final    Comment:        The GeneXpert MRSA Assay (FDA approved for NASAL specimens only), is one component of a comprehensive MRSA colonization surveillance program. It is not intended to diagnose MRSA infection nor to guide or monitor treatment for MRSA infections.  Radiology Studies: Ct Head Wo Contrast  03/21/2016  CLINICAL DATA:  Confusion, unsteady gait. EXAM: CT HEAD WITHOUT CONTRAST TECHNIQUE: Contiguous axial images were obtained from the base of the skull through the vertex without intravenous contrast. COMPARISON:  None. FINDINGS: Brain: Ventricles are normal in size and configuration. All areas of the brain demonstrate appropriate gray-white matter differentiation. There is no mass, hemorrhage, edema or other evidence of acute parenchymal abnormality. No extra-axial hemorrhage. Vascular: No hyperdense vessel or unexpected calcification. Skull: Negative for fracture or focal lesion. Sinuses/Orbits: No acute findings. Other: None. IMPRESSION: Normal head CT. Electronically Signed   By: Bary Richard M.D.   On: 03/21/2016 12:04   Mr Brain Wo Contrast  03/21/2016  CLINICAL DATA:  Altered mental status. EXAM: MRI HEAD WITHOUT CONTRAST TECHNIQUE: Multiplanar, multiecho pulse sequences of the brain and surrounding structures were obtained without intravenous contrast. COMPARISON:  CT head 03/21/2016 FINDINGS: Ventricle size normal.  Cerebral volume normal. Negative for acute or chronic infarction Negative for demyelinating disease. Cerebral white matter  normal. Brainstem normal Negative for hemorrhage or fluid collection Negative for mass or edema.  No shift of the midline structures. Paranasal sinuses are normal.  Orbital contents normal. Pituitary and skull base normal. IMPRESSION: Within normal limits. Electronically Signed   By: Marlan Palau M.D.   On: 03/21/2016 19:07        Scheduled Meds: . amitriptyline  25 mg Oral QHS  . calcium carbonate  1 tablet Oral Q breakfast  . cholecalciferol  2,000 Units Oral Daily  . heparin  5,000 Units Subcutaneous Q8H  . pantoprazole  40 mg Oral Daily  . sodium chloride  1,000 mL Intravenous Once  . sodium chloride  1,000 mL Intravenous Once  . vitamin B-12  1,000 mcg Oral Daily   Continuous Infusions: . dextrose 5 % and 0.45% NaCl 50 mL/hr at 03/22/16 0100  . dextrose 5 % and 0.45% NaCl 50 mL/hr at 03/21/16 2000            Roderick Sweezy Annett Gula, MD [facility] Pager [phone]  If 7PM-7AM, please contact night-coverage [email or web address] Password [facility] 03/22/2016, 7:47 AM

## 2016-03-22 NOTE — Progress Notes (Signed)
Patient arrived to 5M14 from 2H accompanied by mom. Safety precautions and orders reviewed with patient. VSS. Pt request her xanax. No other distress noted. Will continue to monitor.   Sim BoastHavy, RN

## 2016-03-22 NOTE — Progress Notes (Signed)
Patient has been sleepy and drowsy  on and off with slow speech however follow command and forgetful  Of the time of year but later corrects self. MD aware. Of pt condition Neuro team consulted  And made round on pt. EEG ordered not yet done .Marland Kitchen.  No Seizure activity ordered. Pt resting quietly in bed now.  With no distress   Pt is neuro stable however .RN will continue to monitor pt a possible transfer to floor later during the day..Marland Kitchen

## 2016-03-22 NOTE — Progress Notes (Signed)
EEG completed, results pending. 

## 2016-03-22 NOTE — Progress Notes (Signed)
Patient is being transferred to 78M room 14 report called to receiving unit  .spoke with  RN Havy . Patient more alert and oriented. Condition at transfer is stable.

## 2016-03-23 DIAGNOSIS — F419 Anxiety disorder, unspecified: Secondary | ICD-10-CM | POA: Diagnosis not present

## 2016-03-23 DIAGNOSIS — E669 Obesity, unspecified: Secondary | ICD-10-CM | POA: Diagnosis not present

## 2016-03-23 DIAGNOSIS — G9340 Encephalopathy, unspecified: Secondary | ICD-10-CM | POA: Diagnosis not present

## 2016-03-23 DIAGNOSIS — K219 Gastro-esophageal reflux disease without esophagitis: Secondary | ICD-10-CM | POA: Diagnosis not present

## 2016-03-23 DIAGNOSIS — E162 Hypoglycemia, unspecified: Secondary | ICD-10-CM | POA: Diagnosis not present

## 2016-03-23 DIAGNOSIS — E539 Vitamin B deficiency, unspecified: Secondary | ICD-10-CM

## 2016-03-23 DIAGNOSIS — E559 Vitamin D deficiency, unspecified: Secondary | ICD-10-CM | POA: Diagnosis not present

## 2016-03-23 DIAGNOSIS — F39 Unspecified mood [affective] disorder: Secondary | ICD-10-CM | POA: Diagnosis not present

## 2016-03-23 DIAGNOSIS — Z9884 Bariatric surgery status: Secondary | ICD-10-CM | POA: Diagnosis not present

## 2016-03-23 DIAGNOSIS — F329 Major depressive disorder, single episode, unspecified: Secondary | ICD-10-CM | POA: Diagnosis not present

## 2016-03-23 LAB — CBC WITH DIFFERENTIAL/PLATELET
Basophils Absolute: 0 K/uL (ref 0.0–0.1)
Basophils Relative: 1 %
Eosinophils Absolute: 0.1 K/uL (ref 0.0–0.7)
Eosinophils Relative: 3 %
HCT: 47.3 % — ABNORMAL HIGH (ref 36.0–46.0)
Hemoglobin: 14 g/dL (ref 12.0–15.0)
Lymphocytes Relative: 51 %
Lymphs Abs: 2.2 K/uL (ref 0.7–4.0)
MCH: 26.2 pg (ref 26.0–34.0)
MCHC: 29.6 g/dL — ABNORMAL LOW (ref 30.0–36.0)
MCV: 88.4 fL (ref 78.0–100.0)
Monocytes Absolute: 0.3 K/uL (ref 0.1–1.0)
Monocytes Relative: 7 %
Neutro Abs: 1.7 K/uL (ref 1.7–7.7)
Neutrophils Relative %: 40 %
Platelets: 178 K/uL (ref 150–400)
RBC: 5.35 MIL/uL — ABNORMAL HIGH (ref 3.87–5.11)
RDW: 15 % (ref 11.5–15.5)
WBC: 4.4 K/uL (ref 4.0–10.5)

## 2016-03-23 LAB — BASIC METABOLIC PANEL WITH GFR
Anion gap: 8 (ref 5–15)
BUN: 9 mg/dL (ref 6–20)
CO2: 20 mmol/L — ABNORMAL LOW (ref 22–32)
Calcium: 8.4 mg/dL — ABNORMAL LOW (ref 8.9–10.3)
Chloride: 111 mmol/L (ref 101–111)
Creatinine, Ser: 0.78 mg/dL (ref 0.44–1.00)
GFR calc Af Amer: >60 mL/min
GFR calc non Af Amer: >60 mL/min
Glucose, Bld: 78 mg/dL (ref 65–99)
Potassium: 3.9 mmol/L (ref 3.5–5.1)
Sodium: 139 mmol/L (ref 135–145)

## 2016-03-23 LAB — GLUCOSE, CAPILLARY
GLUCOSE-CAPILLARY: 94 mg/dL (ref 65–99)
GLUCOSE-CAPILLARY: 83 mg/dL (ref 65–99)
GLUCOSE-CAPILLARY: 77 mg/dL (ref 65–99)
GLUCOSE-CAPILLARY: 114 mg/dL — AB (ref 65–99)
GLUCOSE-CAPILLARY: 87 mg/dL (ref 65–99)
GLUCOSE-CAPILLARY: 109 mg/dL — AB (ref 65–99)
GLUCOSE-CAPILLARY: 132 mg/dL — AB (ref 65–99)
Glucose-Capillary: 84 mg/dL (ref 65–99)
Glucose-Capillary: 113 mg/dL — ABNORMAL HIGH (ref 65–99)
Glucose-Capillary: 103 mg/dL — ABNORMAL HIGH (ref 65–99)

## 2016-03-23 NOTE — Plan of Care (Addendum)
Problem: Food- and Nutrition-Related Knowledge Deficit (NB-1.1) Goal: Nutrition education Formal process to instruct or train a patient/client in a skill or to impart knowledge to help patients/clients voluntarily manage or modify food choices and eating behavior to maintain or improve health. Outcome: Completed/Met Date Met:  03/23/16 Nutrition Education Note  RD consulted for diet education regarding gastric bypass and current diet. Pt with history of a gastric bypass done in 2002. Pt reports usually consuming 3 meals a day with no other difficulties. Diet recall includes a bagel with fruit for breakfast, smart one prepared meal of either chicken alfredo or pasta with veggies, and a dinner containing protein, starch, and veggies. Pt additionally reports usual snack is nuts with dried fruit. Pt reports taking a multivitamin daily which contains calcium citrate, Vitamin D, folate, biotin, and Vitamin B12. Pt was given "General, Healthful Nutrition Therapy" handout from the Academy of Nutrition and Dietetics Manual. Pt encouraged to continue consuming a diet with a variety of fruits and vegetable and whole grains. Pt questions why she has hypoglycemia. Pt to follow up with MD regarding the issue. RD contact information given. Teach back method used.  Expect good compliance.  Body mass index is 41.97 kg/(m^2). Pt meets morbid obesity category.  Pt is on a regular diet. Pt reports appetite is fine with no other difficulties. Labs and medications reviewed. No further nutrition interventions warranted at this time. If nutrition issues arise, re-consult RD.  Corrin Parker, MS, RD, LDN Pager # [phone] After hours/ weekend pager # [phone]

## 2016-03-23 NOTE — Consult Note (Signed)
EEG Report  Clinical history:  Hypoglycemia and Syncope.  Technical Summary:  A 19 Channel digital EEG was performed using the 10-20 international system of electrode placement.  Bipolar and referential montages were used.  Total recording time was 20 minutes.  Findings:  There is moderately well developed posterior dominant rhythm of 8-9 Hz.  Photic stimulation did not produce a driving response and did not elicit any further abnormalities.  Hyperventilation was not performed.  There was no focal slowing.  There were no epileptiform discharges or electrographic seizures present.  No sleep was recorded.   Impression:  This is a normal EEG in the awake state.  Although there is no evidence of a seizure tendency on this recording, it does not rule out underlying epilepsy.  A repeat EEG with sleep deprivation and/or prolonged EEG may be of additional value if clinically indicated.

## 2016-03-23 NOTE — Evaluation (Signed)
Physical Therapy Evaluation and Discharge Patient Details Name: Toni [name] MRN: [id number] DOB: 03/15/1976 Today's Date: 03/23/2016   History of Present Illness  Toni [name] is an 40 y.o. female With past medical history of obesity, anxiety, depression, vitamin D and B12 deficiency, gastric bypass, presents with altered mental status. Patient woke up at 5 AM, appeared to be confused to husband, lethargic. Drove her to work given that she was lethargic and confused. At work patient had an episode of syncope, there was no seizure-like activity, no urinary or stool incontinence. After being transported to the ED, patient was noticed to be hypoglycemic. CBG was 54 MRI negative. ? episode also medicine related    Clinical Impression  Patient evaluated by Physical Therapy with no further acute PT needs identified. All education has been completed and the patient has no further questions. PT is signing off. Thank you for this referral.     Follow Up Recommendations No PT follow up    Equipment Recommendations  None recommended by PT    Recommendations for Other Services       Precautions / Restrictions Precautions Precautions: None      Mobility  Bed Mobility Overal bed mobility: Needs Assistance Bed Mobility: Sit to Supine       Sit to supine: Supervision   General bed mobility comments: supervision only due to IV tubing and pt not mindful of this   Transfers Overall transfer level: Independent Equipment used: None Transfers: Sit to/from [employer]           General transfer comment: x2  Ambulation/Gait Ambulation/Gait assistance: Supervision Ambulation Distance (Feet): 180 Feet Assistive device: None Gait Pattern/deviations: WFL(Within Functional Limits)   Gait velocity interpretation: Below normal speed for age/gender General Gait Details: maintains straight path, even with head turns; able to incr velocity  Stairs Stairs:  (agreed will not be an  issue)          Wheelchair Mobility    Modified Rankin (Stroke Patients Only)       Balance Overall balance assessment: Independent (bending, reaching, turning without deficits)                                           Pertinent Vitals/Pain Pain Assessment: No/denies pain    Home Living Family/patient expects to be discharged to:: Private residence Living Arrangements: Spouse/significant other;Children (17 and 11) Available Help at Discharge: Family;Available PRN/intermittently Type of Home: Apartment Home Access: Stairs to enter Entrance Stairs-Rails: Doctor, general practiceight;Left Entrance Stairs-Number of Steps: 15 Home Layout: One level        Prior Function Level of Independence: Independent         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"     Hand Dominance        Extremity/Trunk Assessment   Upper Extremity Assessment: Overall WFL for tasks assessed           Lower Extremity Assessment: Overall WFL for tasks assessed      Cervical / Trunk Assessment: Normal  Communication   Communication: No difficulties  Cognition Arousal/Alertness: Awake/alert Behavior During Therapy: Flat affect Overall Cognitive Status: Within Functional Limits for tasks assessed (Pt/husband report "basically" back to normal)                      General Comments General comments (skin integrity, edema, etc.): Patient expressed extreme fatigue after ambulating 180 ft. Encouraged  to ambulate with nursing assist (pt not very mindful/cautious with IV tubing/site). RN and NT informed    Exercises        Assessment/Plan    PT Assessment Patent does not need any further PT services  PT Diagnosis Generalized weakness   PT Problem List    PT Treatment Interventions     PT Goals (Current goals can be found in the Care Plan section) Acute Rehab PT Goals PT Goal Formulation: All assessment and education complete, DC therapy    Frequency     Barriers to  discharge        Co-evaluation               End of Session   Activity Tolerance: Patient limited by fatigue Patient left: in bed;with call bell/phone within reach;with family/visitor present Nurse Communication: Mobility status (likely up independently if IV d/c'd)    Functional Assessment Tool Used: clinical judgement Functional Limitation: Mobility: Walking and moving around Mobility: Walking and Moving Around Current Status [phone]): 0 percent impaired, limited or restricted Mobility: Walking and Moving Around Goal Status [phone]): 0 percent impaired, limited or restricted Mobility: Walking and Moving Around Discharge Status [phone]): 0 percent impaired, limited or restricted    Time: 9147-8295 PT Time Calculation (min) (ACUTE ONLY): 11 min   Charges:   PT Evaluation $PT Eval Low Complexity: 1 Procedure     PT G Codes:   PT G-Codes **NOT FOR INPATIENT CLASS** Functional Assessment Tool Used: clinical judgement Functional Limitation: Mobility: Walking and moving around Mobility: Walking and Moving Around Current Status (A2130): 0 percent impaired, limited or restricted Mobility: Walking and Moving Around Goal Status (Q6578): 0 percent impaired, limited or restricted Mobility: Walking and Moving Around Discharge Status (I6962): 0 percent impaired, limited or restricted    Finnis Colee 03/23/2016, 9:10 AM Pager [phone]

## 2016-03-23 NOTE — Progress Notes (Signed)
PROGRESS NOTE  Toni [name] ZOX:[id number] DOB: [birth year] DOA: 03/21/2016 PCP: Piedad Climes, PA-C  HPI/Recap of past 24 hours:  Feeling better, still weak, but aaox3, family in room  Assessment/Plan: Active Problems:   Esophageal reflux   Anxiety and depression   Vitamin D deficiency   Vitamin B deficiency   Hypoglycemia   Encephalopathy acute  Acute encephalopathy with h/o seizure in 2015 - seizure?  in the setting of hypoglycemia, she took diflucan in the past few days which could lower seizure threshold, side effect from robaxin/ Xanax/percocet? - CT head / MRI brain no acute findings -eeg unremarkable, neurology consulted/input appreciated  Hypoglycemia (patient's mother has diabetes on meds) - Unclear etiology, she denies taking any herbal supplements, or medication to lose weight, denies taking any oral hypoglycemic agents, insulin, proinsulin level pending, C-peptide wnl, , sulfonylureas level pending, TSH wnl, folic acid/ B-12 wnl, and cortisol level wnl. b1 pending - Continue with D5 half-normal saline,   H/o gastric bypass surgery with h/o Vitamin D and B deficiency - Continue supplement  GERD - Continue with PPI  Anxiety and depression - We will resume on Xanax lower dose to prevent withdrawals    DVT Prophylaxis Heparin     Code Status: full  Family Communication: patient and her husband and her mother in room  Disposition Plan: home in 1-2 days   Consultants:  neurology  Procedures:  EEG  Antibiotics:  none   Objective: BP 103/51 mmHg  Pulse 64  Temp(Src) 97.4 F (36.3 C) (Oral)  Resp 20  Ht  (1.676 m)  Wt 117.9 kg (259 lb 14.8 oz)  BMI 41.97 kg/m2  SpO2 100%  LMP 03/06/2016  Intake/Output Summary (Last 24 hours) at 03/23/16 0817 Last data filed at 03/22/16 1800  Gross per 24 hour  Intake   1440 ml  Output    300 ml  Net   1140 ml   Filed Weights   03/21/16 1900  Weight: 117.9 kg (259 lb 14.8 oz)      Exam:   General:  NAD  Cardiovascular: RRR  Respiratory: CTABL  Abdomen: Soft/ND/NT, positive BS  Musculoskeletal: No Edema  Neuro: aaox3, but not able to recall event  Data Reviewed: Basic Metabolic Panel:  Recent Labs Lab 03/21/16 1132 03/21/16 1138 03/22/16 0315 03/23/16 0557  NA 140 143 141 139  K 4.1 4.1 3.8 3.9  CL 112* 107 113* 111  CO2 21*  --  21* 20*  GLUCOSE 90 86 77 78  BUN CREATININE 1.09* 1.10* 0.95 0.78  CALCIUM 9.3  --  8.5* 8.4*   Liver Function Tests:  Recent Labs Lab 03/21/16 1132  AST 17  ALT 14  ALKPHOS 54  BILITOT 0.3  PROT 6.6  ALBUMIN 3.5   No results for input(s): LIPASE, AMYLASE in the last 168 hours. No results for input(s): AMMONIA in the last 168 hours. CBC:  Recent Labs Lab 03/21/16 1132 03/21/16 1138 03/22/16 0315 03/23/16 0557  WBC 8.5  --  6.4 4.4  NEUTROABS 4.7  --   --  1.7  HGB 12.7 13.6 11.8* 14.0  HCT 39.8 40.0 37.7 47.3*  MCV 86.0  --  86.5 88.4  PLT 338  --  300 178   Cardiac Enzymes:   No results for input(s): CKTOTAL, CKMB, CKMBINDEX, TROPONINI in the last 168 hours. BNP (last 3 results) No results for input(s): BNP in the last 8760 hours.  ProBNP (last 3 results)  No results for input(s): PROBNP in the last 8760 hours.  CBG:  Recent Labs Lab 03/22/16 2150 03/22/16 2356 03/23/16 0154 03/23/16 0409 03/23/16 0533  GLUCAP 80 86 94 83 77    Recent Results (from the past 240 hour(s))  N. gonorrhoeae, RNA     Status: None   Collection Time: 03/20/16  3:07 PM  Result Value Ref Range Status   GC Probe RNA NOT DETECTED  Final    Comment:                    **Normal Reference Range: NOT DETECTED**   This test was performed using the APTIMA COMBO2 Assay ([employer]   The analytical performance characteristics of this assay, when used to test SurePath specimens have been determined by [facility]     Urine culture     Status: None   Collection Time: 03/20/16   3:16 PM  Result Value Ref Range Status   Colony Count 25,000 COLONIES/ML  Final   Organism ID, Bacteria Multiple bacterial morphotypes present, none  Final   Organism ID, Bacteria predominant. Suggest appropriate recollection if   Final   Organism ID, Bacteria clinically indicated.  Final  MRSA PCR Screening     Status: None   Collection Time: 03/21/16  8:04 PM  Result Value Ref Range Status   MRSA by PCR NEGATIVE NEGATIVE Final    Comment:        The GeneXpert MRSA Assay (FDA approved for NASAL specimens only), is one component of a comprehensive MRSA colonization surveillance program. It is not intended to diagnose MRSA infection nor to guide or monitor treatment for MRSA infections.      Studies: No results found.  Scheduled Meds: . amitriptyline  25 mg Oral QHS  . calcium carbonate  1 tablet Oral Q breakfast  . cholecalciferol  2,000 Units Oral Daily  . heparin  5,000 Units Subcutaneous Q8H  . pantoprazole  40 mg Oral Daily  . sodium chloride  1,000 mL Intravenous Once  . sodium chloride  1,000 mL Intravenous Once  . vitamin B-12  1,000 mcg Oral Daily    Continuous Infusions: . dextrose 5 % and 0.45% NaCl 50 mL/hr at 03/22/16 1435  . dextrose 5 % and 0.45% NaCl 50 mL/hr at 03/21/16 2000     Time spent: 25mins  Laverne Klugh MD, PhD  [facility] Pager [phone]. If 7PM-7AM, please contact night-coverage at [email or web address], password [facility] 03/23/2016, 8:17 AM

## 2016-03-24 DIAGNOSIS — E539 Vitamin B deficiency, unspecified: Secondary | ICD-10-CM | POA: Diagnosis not present

## 2016-03-24 DIAGNOSIS — E162 Hypoglycemia, unspecified: Secondary | ICD-10-CM | POA: Diagnosis not present

## 2016-03-24 DIAGNOSIS — E559 Vitamin D deficiency, unspecified: Secondary | ICD-10-CM | POA: Diagnosis not present

## 2016-03-24 DIAGNOSIS — E669 Obesity, unspecified: Secondary | ICD-10-CM | POA: Diagnosis not present

## 2016-03-24 DIAGNOSIS — G9340 Encephalopathy, unspecified: Secondary | ICD-10-CM | POA: Diagnosis not present

## 2016-03-24 DIAGNOSIS — F39 Unspecified mood [affective] disorder: Secondary | ICD-10-CM | POA: Diagnosis not present

## 2016-03-24 DIAGNOSIS — F419 Anxiety disorder, unspecified: Secondary | ICD-10-CM | POA: Diagnosis not present

## 2016-03-24 DIAGNOSIS — K219 Gastro-esophageal reflux disease without esophagitis: Secondary | ICD-10-CM | POA: Diagnosis not present

## 2016-03-24 DIAGNOSIS — Z9884 Bariatric surgery status: Secondary | ICD-10-CM | POA: Diagnosis not present

## 2016-03-24 DIAGNOSIS — F329 Major depressive disorder, single episode, unspecified: Secondary | ICD-10-CM | POA: Diagnosis not present

## 2016-03-24 LAB — BASIC METABOLIC PANEL
ANION GAP: 6 (ref 5–15)
BUN: 10 mg/dL (ref 6–20)
CHLORIDE: 110 mmol/L (ref 101–111)
CO2: 23 mmol/L (ref 22–32)
CREATININE: 0.85 mg/dL (ref 0.44–1.00)
Calcium: 8.2 mg/dL — ABNORMAL LOW (ref 8.9–10.3)
GFR calc non Af Amer: >60 mL/min (ref >60)
Glucose, Bld: 83 mg/dL (ref 65–99)
POTASSIUM: 3.7 mmol/L (ref 3.5–5.1)
Sodium: 139 mmol/L (ref 135–145)

## 2016-03-24 LAB — VITAMIN B1: Vitamin B1 (Thiamine): 107.2 nmol/L (ref 66.5–200.0)

## 2016-03-24 LAB — GLUCOSE, CAPILLARY: GLUCOSE-CAPILLARY: 95 mg/dL (ref 65–99)

## 2016-03-24 LAB — MAGNESIUM: MAGNESIUM: 2 mg/dL (ref 1.7–2.4)

## 2016-03-24 NOTE — Progress Notes (Signed)
Pt discharged home with spouse. IV removed and telemetry discontinued. Discharge info given. Questions asked and answered. Ambulated to vehicle via ED. Lawson RadarHeather M Joya Willmott

## 2016-03-24 NOTE — Discharge Summary (Signed)
Discharge Summary  Toni [name] ZOX:[id number] DOB: 1976-04-06  PCP: Piedad Climes, PA-C  Admit date: 03/21/2016 Discharge date: 03/24/2016  Time spent: <52mins  Recommendations for Outpatient Follow-up:  1. F/u with PMD within a week  for hospital discharge follow up, repeat bmp at follow up, -pmd follow up for blood sugar monitoring and pmd to follow up pending test  Result. 2. F/u with [facility] 3. Patient should not drive until cleared by pmd and neurology at follow up  Discharge Diagnoses:  Active Hospital Problems   Diagnosis Date Noted  . Hypoglycemia 03/21/2016  . Encephalopathy acute 03/21/2016  . Vitamin B deficiency 06/05/2015  . Vitamin D deficiency 04/30/2015  . Anxiety and depression 04/11/2015  . Esophageal reflux 04/11/2015    Resolved Hospital Problems   Diagnosis Date Noted Date Resolved  No resolved problems to display.    Discharge Condition: stable  Diet recommendation: heart healthy/carb modified  Filed Weights   03/21/16 1900  Weight: 117.9 kg (259 lb 14.8 oz)    History of present illness:  Toni [name] is a 40 y.o. female, With past medical history of obesity, anxiety, depression, vitamin D and B deficiency, gastric bypass, presents with altered mental status, at bedside, gives most of the history, patient woke up at 5 AM, appears to be confused to husband, lethargic, where she went to do laundry that early, he told her to go back to sleep, she woke up one time, he drove her to work given she was lethargic and confused, and at work patient had an episode of syncope, there was no seizure-like activity, no no urinary or stool incontinence, in ED patient was noticed to be hypoglycemic, recurrent despite D50 pushes and orange juice, so she started on D5 half-normal saline, she is afebrile, no leukocytosis, negative urinalysis, CT head with no acute findings, but this was called to admit.  Hospital Course:  Active  Problems:   Esophageal reflux   Anxiety and depression   Vitamin D deficiency   Vitamin B deficiency   Hypoglycemia   Encephalopathy acute   Acute encephalopathy with h/o seizure in 2015 - seizure? no reported jerky movement but does has staring in the setting of hypoglycemia, she took diflucan in the past few days which could lower seizure threshold, side effect from robaxin/ Xanax/percocet? - CT head / MRI brain no acute findings -eeg unremarkable,  -neurology think symptom could  Be from polypharmacy, neurology input appreciated, continue outpatient follow up with neurology  Hypoglycemia (patient's mother and husband has diabetes on meds) - Unclear etiology, she denies taking any herbal supplements, or medication to lose weight, denies taking any oral hypoglycemic agents, insulin, proinsulin level pending, C-peptide wnl, , sulfonylureas level pending, TSH wnl, folic acid/ B-12 wnl, and cortisol level wnl. b1 pending - patient received D5 half-normal saline,  -pmd follow up for blood sugar monitoring and pmd to follow up pending test  Result.  H/o gastric bypass surgery with h/o Vitamin D and B deficiency - Continue supplement  GERD - Continue with PPI  Anxiety and depression - We will resume on Xanax lower dose to prevent withdrawals  Morbid obesity: Body mass index is 41.97 kg/(m^2).   Code Status: full  Family Communication: patient and her husband   Disposition Plan: home    Consultants:  neurology  Procedures:  EEG  Antibiotics:  none   Discharge Exam: BP 105/53 mmHg  Pulse 69  Temp(Src) 98 F (36.7 C) (Oral)  Resp 20  Ht 5'  6" (1.676 m)  Wt 117.9 kg (259 lb 14.8 oz)  BMI 41.97 kg/m2  SpO2 95%  LMP 03/06/2016   General: NAD, obesity  Cardiovascular: RRR  Respiratory: CTABL  Abdomen: Soft/ND/NT, positive BS  Musculoskeletal: No Edema  Neuro: aaox3, but not able to recall event   Discharge Instructions You were cared for by a  hospitalist during your hospital stay. If you have any questions about your discharge medications or the care you received while you were in the hospital after you are discharged, you can call the unit and asked to speak with the hospitalist on call if the hospitalist that took care of you is not available. Once you are discharged, your primary care physician will handle any further medical issues. Please note that NO REFILLS for any discharge medications will be authorized once you are discharged, as it is imperative that you return to your primary care physician (or establish a relationship with a primary care physician if you do not have one) for your aftercare needs so that they can reassess your need for medications and monitor your lab values.  Discharge Instructions    Discharge instructions    Complete by:  As directed   Regular diet, please do not drive until cleared by pmd at follow up appointment     Increase activity slowly    Complete by:  As directed             Medication List    STOP taking these medications        cetirizine 10 MG tablet  Commonly known as:  ZYRTEC      TAKE these medications        acetaminophen 500 MG tablet  Commonly known as:  TYLENOL  Take 1,000 mg by mouth every 6 (six) hours as needed for mild pain, moderate pain or headache.     alprazolam 2 MG tablet  Commonly known as:  XANAX  Take 0.5 tablets (1 mg total) by mouth 3 (three) times daily as needed for anxiety.     amitriptyline 25 MG tablet  Commonly known as:  ELAVIL  Take 1 tablet (25 mg total) by mouth at bedtime.     BIOTIN PO  Take 1 tablet by mouth daily.     Calcium 250 MG Caps  Take 1 capsule by mouth daily.     CRANBERRY EXTRACT PO  Take 1 capsule by mouth daily as needed (for symptoms).     eletriptan 40 MG tablet  Commonly known as:  RELPAX  Take 1 tablet (40 mg total) by mouth as needed for migraine or headache. One tablet by mouth at onset of headache. May repeat in  2 hours if headache persists or recurs.     INTEGRA PLUS Caps  1 PO daily for anemia     MONONESSA 0.25-35 MG-MCG tablet  Generic drug:  norgestimate-ethinyl estradiol  Take 1 tablet by mouth once daily     omeprazole 20 MG capsule  Commonly known as:  PRILOSEC  TAKE 1 CAPSULE BY MOUTH 2 TIMES DAILY BEFORE A MEAL     ondansetron 8 MG tablet  Commonly known as:  ZOFRAN  Take by mouth every 8 (eight) hours as needed for nausea or vomiting.     oxyCODONE-acetaminophen 10-325 MG tablet  Commonly known as:  PERCOCET  Take 1 tablet by mouth every 8 (eight) hours as needed for pain. Take 1 tablet by mouth every 8 hours as needed for severe pain.  vitamin B-12 1000 MCG tablet  Commonly known as:  CYANOCOBALAMIN  Take 1,000 mcg by mouth daily.     Vitamin D (Ergocalciferol) 50000 units Caps capsule  Commonly known as:  DRISDOL  Take 1 capsule (50,000 units) by mouth every 7 days for 10 weeks.     Vitamin D 2000 units Caps  Take 1 capsule by mouth daily.       Allergies  Allergen Reactions  . Ibuprofen     Causes ulcers       Follow-up Information    Follow up with Piedad Climes, PA-C In 1 week.   Specialty:  Family Medicine   Why:  hospital discharge follow up, repeat bmp at follow up   Contact information:   [address] [place] [address] [phone]        The results of significant diagnostics from this hospitalization (including imaging, microbiology, ancillary and laboratory) are listed below for reference.    Significant Diagnostic Studies: Ct Head Wo Contrast  03/21/2016  CLINICAL DATA:  Confusion, unsteady gait. EXAM: CT HEAD WITHOUT CONTRAST TECHNIQUE: Contiguous axial images were obtained from the base of the skull through the vertex without intravenous contrast. COMPARISON:  None. FINDINGS: Brain: Ventricles are normal in size and configuration. All areas of the brain demonstrate appropriate gray-white matter differentiation.  There is no mass, hemorrhage, edema or other evidence of acute parenchymal abnormality. No extra-axial hemorrhage. Vascular: No hyperdense vessel or unexpected calcification. Skull: Negative for fracture or focal lesion. Sinuses/Orbits: No acute findings. Other: None. IMPRESSION: Normal head CT. Electronically Signed   By: Bary Richard M.D.   On: 03/21/2016 12:04   Mr Brain Wo Contrast  03/21/2016  CLINICAL DATA:  Altered mental status. EXAM: MRI HEAD WITHOUT CONTRAST TECHNIQUE: Multiplanar, multiecho pulse sequences of the brain and surrounding structures were obtained without intravenous contrast. COMPARISON:  CT head 03/21/2016 FINDINGS: Ventricle size normal.  Cerebral volume normal. Negative for acute or chronic infarction Negative for demyelinating disease. Cerebral white matter normal. Brainstem normal Negative for hemorrhage or fluid collection Negative for mass or edema.  No shift of the midline structures. Paranasal sinuses are normal.  Orbital contents normal. Pituitary and skull base normal. IMPRESSION: Within normal limits. Electronically Signed   By: Marlan Palau M.D.   On: 03/21/2016 19:07    Microbiology: Recent Results (from the past 240 hour(s))  N. gonorrhoeae, RNA     Status: None   Collection Time: 03/20/16  3:07 PM  Result Value Ref Range Status   GC Probe RNA NOT DETECTED  Final    Comment:                    **Normal Reference Range: NOT DETECTED**   This test was performed using the APTIMA COMBO2 Assay ([employer]   The analytical performance characteristics of this assay, when used to test SurePath specimens have been determined by [facility]     Urine culture     Status: None   Collection Time: 03/20/16  3:16 PM  Result Value Ref Range Status   Colony Count 25,000 COLONIES/ML  Final   Organism ID, Bacteria Multiple bacterial morphotypes present, none  Final   Organism ID, Bacteria predominant. Suggest appropriate recollection if   Final    Organism ID, Bacteria clinically indicated.  Final  MRSA PCR Screening     Status: None   Collection Time: 03/21/16  8:04 PM  Result Value Ref Range Status  MRSA by PCR NEGATIVE NEGATIVE Final    Comment:        The GeneXpert MRSA Assay (FDA approved for NASAL specimens only), is one component of a comprehensive MRSA colonization surveillance program. It is not intended to diagnose MRSA infection nor to guide or monitor treatment for MRSA infections.      Labs: Basic Metabolic Panel:  Recent Labs Lab 03/21/16 1132 03/21/16 1138 03/22/16 0315 03/23/16 0557 03/24/16 0507  NA 140 143 141 139 139  K 4.1 4.1 3.8 3.9 3.7  CL 112* 107 113* 111 110  CO2 21*  --  21* 20* 23  GLUCOSE 90 86 77 78 83  BUN 11 13 8 9 10   CREATININE 1.09* 1.10* 0.95 0.78 0.85  CALCIUM 9.3  --  8.5* 8.4* 8.2*  MG  --   --   --   --  2.0   Liver Function Tests:  Recent Labs Lab 03/21/16 1132  AST 17  ALT 14  ALKPHOS 54  BILITOT 0.3  PROT 6.6  ALBUMIN 3.5   No results for input(s): LIPASE, AMYLASE in the last 168 hours. No results for input(s): AMMONIA in the last 168 hours. CBC:  Recent Labs Lab 03/21/16 1132 03/21/16 1138 03/22/16 0315 03/23/16 0557  WBC 8.5  --  6.4 4.4  NEUTROABS 4.7  --   --  1.7  HGB 12.7 13.6 11.8* 14.0  HCT 39.8 40.0 37.7 47.3*  MCV 86.0  --  86.5 88.4  PLT 338  --  300 178   Cardiac Enzymes: No results for input(s): CKTOTAL, CKMB, CKMBINDEX, TROPONINI in the last 168 hours. BNP: BNP (last 3 results) No results for input(s): BNP in the last 8760 hours.  ProBNP (last 3 results) No results for input(s): PROBNP in the last 8760 hours.  CBG:  Recent Labs Lab 03/23/16 1605 03/23/16 1859 03/23/16 2020 03/23/16 2159 03/24/16 0651  GLUCAP 87 109* 132* 103* 95       Signed:  Annaya Bangert MD, PhD  [facility] 03/24/2016, 10:16 AM

## 2016-03-24 NOTE — Progress Notes (Signed)
Pt IV infiltrated. MD states OK to not replace if patient eating well and ambulating in the hallway. Possible discharge today. Will continue to monitor. Lawson RadarHeather M Erman Thum

## 2016-03-25 ENCOUNTER — Telehealth: Payer: Self-pay | Admitting: Behavioral Health

## 2016-03-25 LAB — HEMOGLOBIN A1C
HEMOGLOBIN A1C: 5.1 % (ref 4.8–5.6)
MEAN PLASMA GLUCOSE: 100 mg/dL

## 2016-03-25 LAB — URINE CYTOLOGY ANCILLARY ONLY
BACTERIAL VAGINITIS: NEGATIVE
CANDIDA VAGINITIS: NEGATIVE

## 2016-03-25 NOTE — Telephone Encounter (Signed)
Attempted to reach patient for TCM/Hospital Follow-up. Left message for patient to return call when available.    

## 2016-03-26 ENCOUNTER — Telehealth: Payer: Self-pay | Admitting: *Deleted

## 2016-03-26 LAB — PROINSULIN/INSULIN RATIO
INSULIN: 2.7 u[IU]/mL
PROINSULIN/INSULIN RATIO: 26 %
PROINSULIN: 4.7 pmol/L

## 2016-03-26 NOTE — Telephone Encounter (Signed)
I saw her in September 2015 'She suffered a seizure in June 18 2014, she drove overnight to help her cousin morning, early morning, while moving boxes to [employer], she felt dizziness, lightheadedness, [place] on the ambulance, this [place], this seizure was witnessed by her fianc, saw her felt to the floor, whole-body jerking movement, lasting for few minutes, bilateral tongue biting, urinary incontinence, followed by post event confusion. She now complains of body achy pain, bilateral calf muscle cramping post seizure event."  MRI of the brain and EEG was normal.  It is okay for her to switch to Dr. Lucia GaskinsAhern.

## 2016-03-26 NOTE — Telephone Encounter (Signed)
Pt called, pt seen in the hospital, pt seen Dr. Terrace ArabiaYan in the past, pt want to change to Dr. Lucia GaskinsAhern. Please advise

## 2016-03-26 NOTE — Telephone Encounter (Signed)
Patient declines TCM/Hospital Follow-up at this time. She voiced that her preference is to see a neurologist instead, but will call the office back if she changes her mind.

## 2016-03-26 NOTE — Telephone Encounter (Signed)
Pt called back. She said she was seen in ED 03/21/16 for "seizure". She is inquiring how long it will take for providers to make a decision. Please call

## 2016-03-27 ENCOUNTER — Telehealth: Payer: Self-pay | Admitting: Physician Assistant

## 2016-03-27 ENCOUNTER — Ambulatory Visit (INDEPENDENT_AMBULATORY_CARE_PROVIDER_SITE_OTHER): Payer: 59 | Admitting: Physician Assistant

## 2016-03-27 ENCOUNTER — Encounter: Payer: Self-pay | Admitting: Physician Assistant

## 2016-03-27 VITALS — HR 92 | Temp 98.5°F | Resp 16 | Ht 66.0 in | Wt 262.1 lb

## 2016-03-27 DIAGNOSIS — R4182 Altered mental status, unspecified: Secondary | ICD-10-CM

## 2016-03-27 DIAGNOSIS — R569 Unspecified convulsions: Secondary | ICD-10-CM

## 2016-03-27 DIAGNOSIS — E162 Hypoglycemia, unspecified: Secondary | ICD-10-CM

## 2016-03-27 NOTE — Telephone Encounter (Signed)
Relation to ZO:XWRUpt:self Call back [phone] Pharmacy:  Reason for call:  Patient printed FML[place] paperwork and placed in [name]

## 2016-03-27 NOTE — Progress Notes (Signed)
Pre visit review using our clinic review tool, if applicable. No additional management support is needed unless otherwise documented below in the visit note/SLS  

## 2016-03-27 NOTE — Telephone Encounter (Signed)
Pt called back in to make pcp aware that the forms that were left isn't all that's needed. Pt says that she will have her husband to bring in the entire packet to have forms complete.

## 2016-03-27 NOTE — Patient Instructions (Signed)
Please go to the lab for blood work. I will call with your results. Please continue medications as directed.  You need to remain out of work until neurology assessment. No driving.   Stay hydrated and eat a well-balanced diet.  If you have any recurrent symptoms, please go to the ER or call 911.   Follow-up with me in 3-4 weeks.

## 2016-03-27 NOTE — Telephone Encounter (Signed)
That's fine, call and make an appointment with patient for next month. thanks

## 2016-03-27 NOTE — Progress Notes (Signed)
Patient presents to clinic today for hospital follow-up. Patient admitted to [facility] on 03/21/16 after presenting to ER in altered mental state. For detailed hospital history, please refer to Hospital Encounter in EMR. A summary is as follows: Patient presented to ER in AMS since that morning, having transient abnormal behavior noted by husband and syncopal event occurring at work. Initial ER workup negative except for hypoglycemia. CT Head negative for acute event. Patient started on D50 pushes and was still having transient hypoglycemia. Patient was admitted for further assessment.  During admission, MRI Brain and EEG obtained, both unremarkable. Acute encephalopathy felt to be secondary to medication combinations -- patient took Percocet, Xanax and Robaxin at the same time, along with recent use of Diflucan -- thought to potentially lower seizure threshold. Patient was stabilized with no recurrent symptoms. Outpatient Neurology for ongoing assessment recommended. Patient has not set this up presently.  In regards to the hypoglycemia, this was an isolated incident, resolving during admission. Lab panel including insulin level, proinsulin level, c-peptide, TSH, SU panel, cortisol, B12 and folate levels checked. All within normal limits.  Patient endorses doing well overall since discharge. States she notes a mental fogginess but denies confusion. Denies any seizure-like activity, focal neurological deficit or weakness. Husband noted appetite improving. He would like to discuss setting patient up with [facility].    Past Medical History  Diagnosis Date  . Syncope     with ? seizure in 2015, eval with neruo  . Migraine   . GERD (gastroesophageal reflux disease)     hx gastric ulcer and upper GI bleed, ? NSAID induced  . Anxiety and depression     started with PPD  . Allergy   . Hypertension   . Bacterial vaginitis   . Vitamin D deficiency   . Vitamin B12 deficiency   . Chronic neck pain     sees [place] [facility] for neck strain  . Seizure ([place])     s/p eval with neuro, no medication, no events since    Current Outpatient Prescriptions on File Prior to Visit  Medication Sig Dispense Refill  . acetaminophen (TYLENOL) 500 MG tablet Take 1,000 mg by mouth every 6 (six) hours as needed for mild pain, moderate pain or headache.    . alprazolam (XANAX) 2 MG tablet Take 0.5 tablets (1 mg total) by mouth 3 (three) times daily as needed for anxiety. 90 tablet 0  . amitriptyline (ELAVIL) 25 MG tablet Take 1 tablet (25 mg total) by mouth at bedtime. 30 tablet 5  . BIOTIN PO Take 1 tablet by mouth daily.     . Calcium 250 MG CAPS Take 1 capsule by mouth daily.     . Cholecalciferol (VITAMIN D) 2000 UNITS CAPS Take 1 capsule by mouth daily.     Marland Kitchen CRANBERRY EXTRACT PO Take 1 capsule by mouth daily as needed (for symptoms).    . eletriptan (RELPAX) 40 MG tablet Take 1 tablet (40 mg total) by mouth as needed for migraine or headache. One tablet by mouth at onset of headache. May repeat in 2 hours if headache persists or recurs. 10 tablet 3  . FeFum-FePoly-FA-B Cmp-C-Biot (INTEGRA PLUS) CAPS 1 PO daily for anemia (Patient taking differently: Take 1 capsule by mouth daily. For anemia) 30 capsule 3  . MONONESSA 0.25-35 MG-MCG tablet Take 1 tablet by mouth once daily  4  . omeprazole (PRILOSEC) 20 MG capsule TAKE 1 CAPSULE BY MOUTH 2 TIMES DAILY BEFORE A  MEAL 60 capsule 11  . ondansetron (ZOFRAN) 8 MG tablet Take by mouth every 8 (eight) hours as needed for nausea or vomiting.    Marland Kitchen oxyCODONE-acetaminophen (PERCOCET) 10-325 MG per tablet Take 1 tablet by mouth every 8 (eight) hours as needed for pain. Take 1 tablet by mouth every 8 hours as needed for severe pain. (Patient taking differently: Take 1 tablet by mouth every 8 (eight) hours as needed for pain. ) 15 tablet 0  . vitamin B-12 (CYANOCOBALAMIN) 1000 MCG tablet Take 1,000 mcg by mouth daily.    . Vitamin D, Ergocalciferol, (DRISDOL) 50000  units CAPS capsule Take 1 capsule (50,000 units) by mouth every 7 days for 10 weeks. 10 capsule 0   No current facility-administered medications on file prior to visit.    Allergies  Allergen Reactions  . Ibuprofen     Causes ulcers    Family History  Problem Relation Age of Onset  . Arthritis Mother   . Aneurysm Father     deceased  . Lung cancer Maternal Aunt   . Hyperlipidemia Mother   . Heart disease Mother   . Diabetes Mother   . Asthma Mother     Social History   Social History  . Marital Status: Married    Spouse Name: N/A  . Number of Children: N/A  . Years of Education: N/A   Social History Main Topics  . Smoking status: Never Smoker   . Smokeless tobacco: Never Used  . Alcohol Use: 0.0 oz/week    0 Standard drinks or equivalent per week     Comment: occasional   . Drug Use: No  . Sexual Activity: Not Asked   Other Topics Concern  . None   Social History Narrative    Review of Systems - See HPI.  All other ROS are negative.  Pulse 92  Temp(Src) 98.5 F (36.9 C) (Oral)  Resp 16  Ht 5' 6"  (1.676 m)  Wt 262 lb 2 oz (118.899 kg)  BMI 42.33 kg/m2  SpO2 99%  LMP 03/06/2016  Physical Exam  Constitutional: She is oriented to person, place, and time and well-developed, well-nourished, and in no distress.  HENT:  Head: Normocephalic and atraumatic.  Right Ear: External ear normal.  Left Ear: External ear normal.  Nose: Nose normal.  Mouth/Throat: Oropharynx is clear and moist. No oropharyngeal exudate.  TM within normal limits bilaterally  Eyes: Conjunctivae are normal. Pupils are equal, round, and reactive to light.  Neck: Neck supple.  Cardiovascular: Normal rate, regular rhythm, normal heart sounds and intact distal pulses.   Pulmonary/Chest: Effort normal and breath sounds normal. No respiratory distress. She has no wheezes. She has no rales. She exhibits no tenderness.  Lymphadenopathy:    She has no cervical adenopathy.  Neurological: She  is alert and oriented to person, place, and time. She has normal reflexes. She displays facial symmetry. No cranial nerve deficit. Gait normal. Coordination normal. GCS score is 15.  Skin: Skin is warm and dry.  Psychiatric: She has a flat affect.  Vitals reviewed.   Recent Results (from the past 2160 hour(s))  Urinalysis, Routine w reflex microscopic     Status: Abnormal   Collection Time: 01/16/16  7:06 AM  Result Value Ref Range   Color, Urine YELLOW Yellow;Lt. Yellow   APPearance CLEAR Clear   Specific Gravity, Urine 1.015 1.000-1.030   pH 6.5 5.0 - 8.0   Total Protein, Urine NEGATIVE Negative   Urine Glucose NEGATIVE Negative  Ketones, ur NEGATIVE Negative   Bilirubin Urine NEGATIVE Negative   Hgb urine dipstick NEGATIVE Negative   Urobilinogen, UA 0.2 0.0 - 1.0   Leukocytes, UA NEGATIVE Negative   Nitrite NEGATIVE Negative   WBC, UA 0-2/hpf 0-2/hpf   RBC / HPF 0-2/hpf 0-2/hpf   Squamous Epithelial / LPF Rare(0-4/hpf) Rare(0-4/hpf)   Hyaline Casts, UA Presence of (A) None  VITAMIN D 25 Hydroxy (Vit-D Deficiency, Fractures)     Status: Abnormal   Collection Time: 01/16/16  7:06 AM  Result Value Ref Range   VITD 22.31 (L) 30.00 - 100.00 ng/mL  Vitamin B12     Status: Abnormal   Collection Time: 01/16/16  7:06 AM  Result Value Ref Range   Vitamin B-12 179 (L) 211 - 911 pg/mL  Urinalysis     Status: Abnormal   Collection Time: 01/26/16  4:39 PM  Result Value Ref Range   Color, Urine DARK YELLOW YELLOW    Comment: ** Please note change in unit of measure and reference range(s). **      APPearance CLEAR CLEAR   Specific Gravity, Urine 1.038 (H) 1.001 - 1.035   pH 5.5 5.0 - 8.0   Glucose, UA NEGATIVE NEGATIVE   Bilirubin Urine NEGATIVE NEGATIVE    Comment: Verified by repeat analysis.   Ketones, ur TRACE (A) NEGATIVE   Hgb urine dipstick NEGATIVE NEGATIVE   Protein, ur TRACE (A) NEGATIVE   Nitrite NEGATIVE NEGATIVE   Leukocytes, UA NEGATIVE NEGATIVE  Urine cytology  ancillary only     Status: None   Collection Time: 02/13/16 12:00 AM  Result Value Ref Range   Chlamydia Negative     Comment: Normal Reference Range - Negative   Neisseria gonorrhea Negative     Comment: Normal Reference Range - Negative   Trichomonas Negative     Comment: Normal Reference Range - Negative  Urine cytology ancillary only     Status: None   Collection Time: 02/13/16 12:00 AM  Result Value Ref Range   Bacterial vaginitis Negative for Bacterial Vaginitis Microorganisms     Comment: Normal Reference Range - Negative   Candida vaginitis Negative for Candida Vaginitis Microorganisms     Comment: Normal Reference Range - Negative  POCT urinalysis dipstick     Status: None   Collection Time: 02/13/16  8:00 AM  Result Value Ref Range   Color, UA yellow    Clarity, UA cloudy    Glucose, UA neg    Bilirubin, UA positive    Ketones, UA positive    Spec Grav, UA >=1.030    Blood, UA negative    pH, UA 6.0    Protein, UA positive    Urobilinogen, UA 0.2    Nitrite, UA neg    Leukocytes, UA Negative Negative  CULTURE, URINE COMPREHENSIVE     Status: None   Collection Time: 02/13/16  8:35 AM  Result Value Ref Range   Colony Count 2,000 COLONIES/ML    Organism ID, Bacteria GROUP B STREP (S.AGALACTIAE) ISOLATED     Comment: Beta hemolytic streptococci are predictably susceptible to penicillin and other beta-lactams. Susceptibility testing not routinely performed.   Urine cytology ancillary only     Status: None   Collection Time: 03/20/16 12:00 AM  Result Value Ref Range   Chlamydia Negative     Comment: Normal Reference Range - Negative   Neisseria gonorrhea Negative     Comment: Normal Reference Range - Negative   Trichomonas Negative  Comment: Normal Reference Range - Negative  Urine cytology ancillary only     Status: None   Collection Time: 03/20/16 12:00 AM  Result Value Ref Range   Bacterial vaginitis Negative for Bacterial Vaginitis Microorganisms      Comment: Normal Reference Range - Negative   Candida vaginitis Negative for Candida Vaginitis Microorganisms     Comment: Normal Reference Range - Negative  POCT urinalysis dipstick     Status: None   Collection Time: 03/20/16  2:26 PM  Result Value Ref Range   Color, UA straw    Clarity, UA clear    Glucose, UA neg    Bilirubin, UA neg    Ketones, UA neg    Spec Grav, UA 1.020    Blood, UA neg    pH, UA 6.0    Protein, UA neg    Urobilinogen, UA 0.2    Nitrite, UA neg    Leukocytes, UA Negative Negative  N. gonorrhoeae, RNA     Status: None   Collection Time: 03/20/16  3:07 PM  Result Value Ref Range   GC Probe RNA NOT DETECTED     Comment:                    **Normal Reference Range: NOT DETECTED**   This test was performed using the APTIMA COMBO2 Assay ([employer]   The analytical performance characteristics of this assay, when used to test SurePath specimens have been determined by [facility]     Urine culture     Status: None   Collection Time: 03/20/16  3:16 PM  Result Value Ref Range   Colony Count 25,000 COLONIES/ML    Organism ID, Bacteria Multiple bacterial morphotypes present, none    Organism ID, Bacteria predominant. Suggest appropriate recollection if     Organism ID, Bacteria clinically indicated.   CBG monitoring, ED     Status: None   Collection Time: 03/21/16 11:16 AM  Result Value Ref Range   Glucose-Capillary 72 65 - 99 mg/dL  Protime-INR     Status: None   Collection Time: 03/21/16 11:32 AM  Result Value Ref Range   Prothrombin Time 14.5 11.6 - 15.2 seconds   INR 1.11 0.00 - 1.49  APTT     Status: None   Collection Time: 03/21/16 11:32 AM  Result Value Ref Range   aPTT 30 24 - 37 seconds  CBC     Status: None   Collection Time: 03/21/16 11:32 AM  Result Value Ref Range   WBC 8.5 4.0 - 10.5 K/uL   RBC 4.63 3.87 - 5.11 MIL/uL   Hemoglobin 12.7 12.0 - 15.0 g/dL   HCT 39.8 36.0 - 46.0 %   MCV 86.0 78.0 - 100.0 fL   MCH 27.4  26.0 - 34.0 pg   MCHC 31.9 30.0 - 36.0 g/dL   RDW 14.5 11.5 - 15.5 %   Platelets 338 150 - 400 K/uL  Differential     Status: None   Collection Time: 03/21/16 11:32 AM  Result Value Ref Range   Neutrophils Relative % 54 %   Neutro Abs 4.7 1.7 - 7.7 K/uL   Lymphocytes Relative 37 %   Lymphs Abs 3.1 0.7 - 4.0 K/uL   Monocytes Relative 8 %   Monocytes Absolute 0.7 0.1 - 1.0 K/uL   Eosinophils Relative 1 %   Eosinophils Absolute 0.0 0.0 - 0.7 K/uL   Basophils Relative 0 %   Basophils Absolute 0.0 0.0 -  0.1 K/uL  Comprehensive metabolic panel     Status: Abnormal   Collection Time: 03/21/16 11:32 AM  Result Value Ref Range   Sodium 140 135 - 145 mmol/L   Potassium 4.1 3.5 - 5.1 mmol/L   Chloride 112 (H) 101 - 111 mmol/L   CO2 21 (L) 22 - 32 mmol/L   Glucose, Bld 90 65 - 99 mg/dL   BUN 11 6 - 20 mg/dL   Creatinine, Ser 1.09 (H) 0.44 - 1.00 mg/dL   Calcium 9.3 8.9 - 10.3 mg/dL   Total Protein 6.6 6.5 - 8.1 g/dL   Albumin 3.5 3.5 - 5.0 g/dL   AST 17 15 - 41 U/L   ALT 14 14 - 54 U/L   Alkaline Phosphatase 54 38 - 126 U/L   Total Bilirubin 0.3 0.3 - 1.2 mg/dL   GFR calc non Af Amer >60 >60 mL/min   GFR calc Af Amer >60 >60 mL/min    Comment: (NOTE) The eGFR has been calculated using the CKD EPI equation. This calculation has not been validated in all clinical situations. eGFR's persistently <60 mL/min signify possible Chronic Kidney Disease.    Anion gap 7 5 - 15  I-stat troponin, ED     Status: None   Collection Time: 03/21/16 11:36 AM  Result Value Ref Range   Troponin i, poc 0.00 0.00 - 0.08 ng/mL   Comment 3            Comment: Due to the release kinetics of cTnI, a negative result within the first hours of the onset of symptoms does not rule out myocardial infarction with certainty. If myocardial infarction is still suspected, repeat the test at appropriate intervals.   I-Stat Chem 8, ED     Status: Abnormal   Collection Time: 03/21/16 11:38 AM  Result Value Ref  Range   Sodium 143 135 - 145 mmol/L   Potassium 4.1 3.5 - 5.1 mmol/L   Chloride 107 101 - 111 mmol/L   BUN 13 6 - 20 mg/dL   Creatinine, Ser 1.10 (H) 0.44 - 1.00 mg/dL   Glucose, Bld 86 65 - 99 mg/dL   Calcium, Ion 1.26 (H) 1.12 - 1.23 mmol/L   TCO2 22 0 - 100 mmol/L   Hemoglobin 13.6 12.0 - 15.0 g/dL   HCT 40.0 36.0 - 46.0 %  CBG monitoring, ED     Status: Abnormal   Collection Time: 03/21/16 12:42 PM  Result Value Ref Range   Glucose-Capillary 54 (L) 65 - 99 mg/dL  CBG monitoring, ED     Status: Abnormal   Collection Time: 03/21/16  1:00 PM  Result Value Ref Range   Glucose-Capillary 101 (H) 65 - 99 mg/dL  I-Stat Beta hCG blood, ED ([facility], [facility], [facility] only)     Status: None   Collection Time: 03/21/16  1:15 PM  Result Value Ref Range   I-stat hCG, quantitative <5.0 <5 mIU/mL   Comment 3            Comment:   GEST. AGE      CONC.  (mIU/mL)   <=1 WEEK        5 - 50     2 WEEKS       50 - 500     3 WEEKS       100 - 10,000     4 WEEKS     1,000 - 30,000        FEMALE AND NON-PREGNANT FEMALE:  LESS THAN 5 mIU/mL   Acetaminophen level     Status: Abnormal   Collection Time: 03/21/16  1:34 PM  Result Value Ref Range   Acetaminophen (Tylenol), Serum <10 (L) 10 - 30 ug/mL    Comment:        THERAPEUTIC CONCENTRATIONS VARY SIGNIFICANTLY. A RANGE OF 10-30 ug/mL MAY BE AN EFFECTIVE CONCENTRATION FOR MANY PATIENTS. HOWEVER, SOME ARE BEST TREATED AT CONCENTRATIONS OUTSIDE THIS RANGE. ACETAMINOPHEN CONCENTRATIONS >150 ug/mL AT 4 HOURS AFTER INGESTION AND >50 ug/mL AT 12 HOURS AFTER INGESTION ARE OFTEN ASSOCIATED WITH TOXIC REACTIONS.   Salicylate level     Status: None   Collection Time: 03/21/16  1:34 PM  Result Value Ref Range   Salicylate Lvl <8.2 2.8 - 30.0 mg/dL  CBG monitoring, ED     Status: Abnormal   Collection Time: 03/21/16  1:41 PM  Result Value Ref Range   Glucose-Capillary 103 (H) 65 - 99 mg/dL   Comment 1 Notify RN    Comment 2 Document in Chart   Urinalysis,  Routine w reflex microscopic     Status: Abnormal   Collection Time: 03/21/16  1:50 PM  Result Value Ref Range   Color, Urine YELLOW YELLOW   APPearance CLEAR CLEAR   Specific Gravity, Urine 1.012 1.005 - 1.030   pH 5.5 5.0 - 8.0   Glucose, UA 250 (A) NEGATIVE mg/dL   Hgb urine dipstick NEGATIVE NEGATIVE   Bilirubin Urine NEGATIVE NEGATIVE   Ketones, ur NEGATIVE NEGATIVE mg/dL   Protein, ur NEGATIVE NEGATIVE mg/dL   Nitrite NEGATIVE NEGATIVE   Leukocytes, UA NEGATIVE NEGATIVE    Comment: MICROSCOPIC NOT DONE ON URINES WITH NEGATIVE PROTEIN, BLOOD, LEUKOCYTES, NITRITE, OR GLUCOSE <1000 mg/dL.  Urine rapid drug screen (hosp performed)     Status: Abnormal   Collection Time: 03/21/16  1:50 PM  Result Value Ref Range   Opiates NONE DETECTED NONE DETECTED   Cocaine NONE DETECTED NONE DETECTED   Benzodiazepines POSITIVE (A) NONE DETECTED   Amphetamines NONE DETECTED NONE DETECTED   Tetrahydrocannabinol NONE DETECTED NONE DETECTED   Barbiturates NONE DETECTED NONE DETECTED    Comment:        DRUG SCREEN FOR MEDICAL PURPOSES ONLY.  IF CONFIRMATION IS NEEDED FOR ANY PURPOSE, NOTIFY LAB WITHIN 5 DAYS.        LOWEST DETECTABLE LIMITS FOR URINE DRUG SCREEN Drug Class       Cutoff (ng/mL) Amphetamine      1000 Barbiturate      200 Benzodiazepine   993 Tricyclics       716 Opiates          300 Cocaine          300 THC              50   CBG monitoring, ED     Status: Abnormal   Collection Time: 03/21/16  2:36 PM  Result Value Ref Range   Glucose-Capillary 47 (L) 65 - 99 mg/dL  CBG monitoring, ED     Status: Abnormal   Collection Time: 03/21/16  3:04 PM  Result Value Ref Range   Glucose-Capillary 111 (H) 65 - 99 mg/dL  CBG monitoring, ED     Status: None   Collection Time: 03/21/16  3:39 PM  Result Value Ref Range   Glucose-Capillary 74 65 - 99 mg/dL  CBG monitoring, ED     Status: None   Collection Time: 03/21/16  4:11 PM  Result Value Ref Range  Glucose-Capillary 80 65 - 99  mg/dL  CBG monitoring, ED     Status: None   Collection Time: 03/21/16  4:36 PM  Result Value Ref Range   Glucose-Capillary 84 65 - 99 mg/dL  C-peptide     Status: None   Collection Time: 03/21/16  4:59 PM  Result Value Ref Range   C-Peptide 2.6 1.1 - 4.4 ng/mL    Comment: (NOTE) C-Peptide reference interval is for fasting patients. Performed At: [facility] [place], [place] [id number] Lindon Romp MD OM:[id number]   Proinsulin/insulin ratio     Status: None   Collection Time: 03/21/16  4:59 PM  Result Value Ref Range   Proinsulin 4.7 pmol/L    Comment: (NOTE) This result was obtained using less than the normal sample volume. We were unable to employ the optimal sample volume because the amount of specimen provided was inadequate. We therefore recommend that the above result be interpreted with caution. Please consult our pediatric directory for minimum sample requirements or contact our laboratory. Reference Range: Adults (fasting): 1.7 - 12    Insulin 2.7 uIU/mL    Comment: (NOTE) Reference Range: Pubertal Children and Adults (fasting): < or = 17    Proinsulin/Insulin Ratio 26 %    Comment: (NOTE) Reference Range: Adults (fasting): 3.4 - 21 Performed At: [facility] [place], [place] [id number] Pepkowitz Sheral Apley MD [id number]   Beta-hydroxybutyric acid     Status: None   Collection Time: 03/21/16  4:59 PM  Result Value Ref Range   Beta-Hydroxybutyric Acid 0.06 0.05 - 0.27 mmol/L  Cortisol     Status: None   Collection Time: 03/21/16  5:00 PM  Result Value Ref Range   Cortisol, Plasma 9.8 ug/dL    Comment: (NOTE) AM    6.7 - 22.6 ug/dL PM   <10.0       ug/dL   Folate     Status: None   Collection Time: 03/21/16  5:00 PM  Result Value Ref Range   Folate 16.1 >5.9 ng/mL  Vitamin B12     Status: None   Collection Time: 03/21/16  5:00 PM  Result Value Ref Range   Vitamin B-12 754 180 - 914  pg/mL    Comment: (NOTE) This assay is not validated for testing neonatal or myeloproliferative syndrome specimens for Vitamin B12 levels.   Hemoglobin A1c     Status: None   Collection Time: 03/21/16  5:00 PM  Result Value Ref Range   Hgb A1c MFr Bld 5.2 4.8 - 5.6 %    Comment: (NOTE)         Pre-diabetes: 5.7 - 6.4         Diabetes: >6.4         Glycemic control for adults with diabetes: <7.0    Mean Plasma Glucose 103 mg/dL    Comment: (NOTE) Performed At: [facility] [place], [place] [id number] Lindon Romp MD [id number]   TSH     Status: None   Collection Time: 03/21/16  5:00 PM  Result Value Ref Range   TSH 1.205 0.350 - 4.500 uIU/mL  CBG monitoring, ED     Status: None   Collection Time: 03/21/16  5:44 PM  Result Value Ref Range   Glucose-Capillary 76 65 - 99 mg/dL  CBG monitoring, ED     Status: None   Collection Time: 03/21/16  6:18 PM  Result Value Ref Range  Glucose-Capillary 94 65 - 99 mg/dL  CBG monitoring, ED     Status: None   Collection Time: 03/21/16  7:22 PM  Result Value Ref Range   Glucose-Capillary 79 65 - 99 mg/dL   Comment 1 Notify RN    Comment 2 Document in Chart   MRSA PCR Screening     Status: None   Collection Time: 03/21/16  8:04 PM  Result Value Ref Range   MRSA by PCR NEGATIVE NEGATIVE    Comment:        The GeneXpert MRSA Assay (FDA approved for NASAL specimens only), is one component of a comprehensive MRSA colonization surveillance program. It is not intended to diagnose MRSA infection nor to guide or monitor treatment for MRSA infections.   TSH     Status: None   Collection Time: 03/21/16  9:18 PM  Result Value Ref Range   TSH 1.596 0.350 - 4.500 uIU/mL  Hemoglobin A1c     Status: None   Collection Time: 03/21/16  9:18 PM  Result Value Ref Range   Hgb A1c MFr Bld 5.1 4.8 - 5.6 %    Comment: (NOTE)         Pre-diabetes: 5.7 - 6.4         Diabetes: >6.4         Glycemic control for adults  with diabetes: <7.0    Mean Plasma Glucose 100 mg/dL    Comment: (NOTE) Performed At: [facility] [place], [place] [id number] Lindon Romp MD [id number]   Glucose, capillary     Status: None   Collection Time: 03/21/16  9:44 PM  Result Value Ref Range   Glucose-Capillary 94 65 - 99 mg/dL   Comment 1 Capillary Specimen   Glucose, capillary     Status: None   Collection Time: 03/21/16 11:55 PM  Result Value Ref Range   Glucose-Capillary 95 65 - 99 mg/dL   Comment 1 Capillary Specimen   Glucose, capillary     Status: None   Collection Time: 03/22/16  2:38 AM  Result Value Ref Range   Glucose-Capillary 84 65 - 99 mg/dL   Comment 1 Notify RN   Basic metabolic panel     Status: Abnormal   Collection Time: 03/22/16  3:15 AM  Result Value Ref Range   Sodium 141 135 - 145 mmol/L   Potassium 3.8 3.5 - 5.1 mmol/L   Chloride 113 (H) 101 - 111 mmol/L   CO2 21 (L) 22 - 32 mmol/L   Glucose, Bld 77 65 - 99 mg/dL   BUN 8 6 - 20 mg/dL   Creatinine, Ser 0.95 0.44 - 1.00 mg/dL   Calcium 8.5 (L) 8.9 - 10.3 mg/dL   GFR calc non Af Amer >60 >60 mL/min   GFR calc Af Amer >60 >60 mL/min    Comment: (NOTE) The eGFR has been calculated using the CKD EPI equation. This calculation has not been validated in all clinical situations. eGFR's persistently <60 mL/min signify possible Chronic Kidney Disease.    Anion gap 7 5 - 15  CBC     Status: Abnormal   Collection Time: 03/22/16  3:15 AM  Result Value Ref Range   WBC 6.4 4.0 - 10.5 K/uL   RBC 4.36 3.87 - 5.11 MIL/uL   Hemoglobin 11.8 (L) 12.0 - 15.0 g/dL   HCT 37.7 36.0 - 46.0 %   MCV 86.5 78.0 - 100.0 fL   MCH 27.1 26.0 - 34.0 pg  MCHC 31.3 30.0 - 36.0 g/dL   RDW 14.7 11.5 - 15.5 %   Platelets 300 150 - 400 K/uL  Glucose, capillary     Status: None   Collection Time: 03/22/16  4:36 AM  Result Value Ref Range   Glucose-Capillary 84 65 - 99 mg/dL   Comment 1 Notify RN   Glucose, capillary     Status: None     Collection Time: 03/22/16  7:34 AM  Result Value Ref Range   Glucose-Capillary 72 65 - 99 mg/dL   Comment 1 Capillary Specimen   Glucose, capillary     Status: None   Collection Time: 03/22/16  9:45 AM  Result Value Ref Range   Glucose-Capillary 90 65 - 99 mg/dL   Comment 1 Capillary Specimen   Glucose, capillary     Status: Abnormal   Collection Time: 03/22/16 11:47 AM  Result Value Ref Range   Glucose-Capillary 136 (H) 65 - 99 mg/dL   Comment 1 Capillary Specimen   Folate     Status: None   Collection Time: 03/22/16 11:54 AM  Result Value Ref Range   Folate 17.0 >5.9 ng/mL  Vitamin B1     Status: None   Collection Time: 03/22/16 11:54 AM  Result Value Ref Range   Vitamin B1 (Thiamine) 107.2 66.5 - 200.0 nmol/L    Comment: (NOTE) Performed At: [facility] [address] [place], [place] [id number] Lindon Romp MD [id number]   Vitamin B12     Status: None   Collection Time: 03/22/16 11:54 AM  Result Value Ref Range   Vitamin B-12 760 180 - 914 pg/mL    Comment: (NOTE) This assay is not validated for testing neonatal or myeloproliferative syndrome specimens for Vitamin B12 levels.   Glucose, capillary     Status: Abnormal   Collection Time: 03/22/16  2:11 PM  Result Value Ref Range   Glucose-Capillary 112 (H) 65 - 99 mg/dL   Comment 1 Capillary Specimen   Glucose, capillary     Status: None   Collection Time: 03/22/16  3:47 PM  Result Value Ref Range   Glucose-Capillary 94 65 - 99 mg/dL   Comment 1 Capillary Specimen   Glucose, capillary     Status: None   Collection Time: 03/22/16  5:57 PM  Result Value Ref Range   Glucose-Capillary 83 65 - 99 mg/dL  Glucose, capillary     Status: None   Collection Time: 03/22/16  7:59 PM  Result Value Ref Range   Glucose-Capillary 96 65 - 99 mg/dL  Glucose, capillary     Status: None   Collection Time: 03/22/16  9:50 PM  Result Value Ref Range   Glucose-Capillary 80 65 - 99 mg/dL  Glucose, capillary      Status: None   Collection Time: 03/22/16 11:56 PM  Result Value Ref Range   Glucose-Capillary 86 65 - 99 mg/dL   Comment 1 Notify RN    Comment 2 Document in Chart   Glucose, capillary     Status: None   Collection Time: 03/23/16  1:54 AM  Result Value Ref Range   Glucose-Capillary 94 65 - 99 mg/dL   Comment 1 Notify RN    Comment 2 Document in Chart   Glucose, capillary     Status: None   Collection Time: 03/23/16  4:09 AM  Result Value Ref Range   Glucose-Capillary 83 65 - 99 mg/dL  Glucose, capillary     Status: None   Collection  Time: 03/23/16  5:33 AM  Result Value Ref Range   Glucose-Capillary 77 65 - 99 mg/dL   Comment 1 Notify RN    Comment 2 Document in Chart   CBC with Differential/Platelet     Status: Abnormal   Collection Time: 03/23/16  5:57 AM  Result Value Ref Range   WBC 4.4 4.0 - 10.5 K/uL   RBC 5.35 (H) 3.87 - 5.11 MIL/uL   Hemoglobin 14.0 12.0 - 15.0 g/dL   HCT 47.3 (H) 36.0 - 46.0 %   MCV 88.4 78.0 - 100.0 fL   MCH 26.2 26.0 - 34.0 pg   MCHC 29.6 (L) 30.0 - 36.0 g/dL   RDW 15.0 11.5 - 15.5 %   Platelets 178 150 - 400 K/uL   Neutrophils Relative % 40 %   Neutro Abs 1.7 1.7 - 7.7 K/uL   Lymphocytes Relative 51 %   Lymphs Abs 2.2 0.7 - 4.0 K/uL   Monocytes Relative 7 %   Monocytes Absolute 0.3 0.1 - 1.0 K/uL   Eosinophils Relative 3 %   Eosinophils Absolute 0.1 0.0 - 0.7 K/uL   Basophils Relative 1 %   Basophils Absolute 0.0 0.0 - 0.1 K/uL  Basic metabolic panel     Status: Abnormal   Collection Time: 03/23/16  5:57 AM  Result Value Ref Range   Sodium 139 135 - 145 mmol/L   Potassium 3.9 3.5 - 5.1 mmol/L   Chloride 111 101 - 111 mmol/L   CO2 20 (L) 22 - 32 mmol/L   Glucose, Bld 78 65 - 99 mg/dL   BUN 9 6 - 20 mg/dL   Creatinine, Ser 0.78 0.44 - 1.00 mg/dL   Calcium 8.4 (L) 8.9 - 10.3 mg/dL   GFR calc non Af Amer >60 >60 mL/min   GFR calc Af Amer >60 >60 mL/min    Comment: (NOTE) The eGFR has been calculated using the CKD EPI equation. This  calculation has not been validated in all clinical situations. eGFR's persistently <60 mL/min signify possible Chronic Kidney Disease.    Anion gap 8 5 - 15  Glucose, capillary     Status: None   Collection Time: 03/23/16  8:20 AM  Result Value Ref Range   Glucose-Capillary 84 65 - 99 mg/dL   Comment 1 Notify RN    Comment 2 Document in Chart   Glucose, capillary     Status: Abnormal   Collection Time: 03/23/16 10:42 AM  Result Value Ref Range   Glucose-Capillary 113 (H) 65 - 99 mg/dL   Comment 1 Notify RN    Comment 2 Document in Chart   Glucose, capillary     Status: Abnormal   Collection Time: 03/23/16  2:25 PM  Result Value Ref Range   Glucose-Capillary 114 (H) 65 - 99 mg/dL   Comment 1 Notify RN    Comment 2 Document in Chart   Glucose, capillary     Status: None   Collection Time: 03/23/16  4:05 PM  Result Value Ref Range   Glucose-Capillary 87 65 - 99 mg/dL   Comment 1 Notify RN    Comment 2 Document in Chart   Glucose, capillary     Status: Abnormal   Collection Time: 03/23/16  6:59 PM  Result Value Ref Range   Glucose-Capillary 109 (H) 65 - 99 mg/dL   Comment 1 Notify RN    Comment 2 Document in Chart   Glucose, capillary     Status: Abnormal   Collection  Time: 03/23/16  8:20 PM  Result Value Ref Range   Glucose-Capillary 132 (H) 65 - 99 mg/dL  Glucose, capillary     Status: Abnormal   Collection Time: 03/23/16  9:59 PM  Result Value Ref Range   Glucose-Capillary 103 (H) 65 - 99 mg/dL   Comment 1 Notify RN    Comment 2 Document in Chart   Basic metabolic panel     Status: Abnormal   Collection Time: 03/24/16  5:07 AM  Result Value Ref Range   Sodium 139 135 - 145 mmol/L   Potassium 3.7 3.5 - 5.1 mmol/L   Chloride 110 101 - 111 mmol/L   CO2 23 22 - 32 mmol/L   Glucose, Bld 83 65 - 99 mg/dL   BUN 10 6 - 20 mg/dL   Creatinine, Ser 0.85 0.44 - 1.00 mg/dL   Calcium 8.2 (L) 8.9 - 10.3 mg/dL   GFR calc non Af Amer >60 >60 mL/min   GFR calc Af Amer >60 >60  mL/min    Comment: (NOTE) The eGFR has been calculated using the CKD EPI equation. This calculation has not been validated in all clinical situations. eGFR's persistently <60 mL/min signify possible Chronic Kidney Disease.    Anion gap 6 5 - 15  Magnesium     Status: None   Collection Time: 03/24/16  5:07 AM  Result Value Ref Range   Magnesium 2.0 1.7 - 2.4 mg/dL  Glucose, capillary     Status: None   Collection Time: 03/24/16  6:51 AM  Result Value Ref Range   Glucose-Capillary 95 65 - 99 mg/dL   Comment 1 Notify RN    Comment 2 Document in Chart     Assessment/Plan: 1. Seizure-like activity ([place]) EEG, CT and MRI negative. No residual seizure-like activity or confusion noted by patient or husband. Needs OP Neurology to follow. Will set up with Dr. Ellouise Newer with [facility] for further assessment. Will repeat CMP today. Will continue low-dose of her Xanax to prevent withdrawals. Will hold off on Robaxin. Thankfully no further need for Diflucan at present.  - Comprehensive metabolic panel - Ambulatory referral to Neurology  2. Hypoglycemia Isolated event. Will check CMP today. All panels obtained during hospitalization are negative. Diet and hydration reviewed. - Comprehensive metabolic panel  3. Altered mental status, unspecified altered mental status type Neuro exam looks good today. Suspect polypharmacy as cause. Will repeat CMP and refer to Neurology for further assessment. - Comprehensive metabolic panel   Leeanne Rio, PA-C

## 2016-03-28 LAB — SULFONYLUREA HYPOGLYCEMICS PANEL, SERUM
Acetohexamide: NEGATIVE ug/mL (ref 20–60)
Chlorpropamide: NEGATIVE ug/mL (ref 75–250)
GLIMEPIRIDE: NEGATIVE ng/mL (ref 80–250)
GLIPIZIDE: NEGATIVE ng/mL (ref 200–1000)
GLYBURIDE: NEGATIVE ng/mL
NATEGLINIDE: NEGATIVE ng/mL
Repaglinide: NEGATIVE ng/mL
TOLBUTAMIDE: NEGATIVE ug/mL (ref 40–100)
Tolazamide: NEGATIVE ug/mL

## 2016-03-28 LAB — COMPREHENSIVE METABOLIC PANEL
ALBUMIN: 3.6 g/dL (ref 3.5–5.2)
ALK PHOS: 58 U/L (ref 39–117)
ALT: 12 U/L (ref 0–35)
AST: 14 U/L (ref 0–37)
BILIRUBIN TOTAL: 0.5 mg/dL (ref 0.2–1.2)
BUN: 18 mg/dL (ref 6–23)
CO2: 27 mEq/L (ref 19–32)
CREATININE: 1.08 mg/dL (ref 0.40–1.20)
Calcium: 9.2 mg/dL (ref 8.4–10.5)
Chloride: 109 mEq/L (ref 96–112)
GFR: 72.1 mL/min (ref >60.00)
GLUCOSE: 73 mg/dL (ref 70–99)
POTASSIUM: 4.4 meq/L (ref 3.5–5.1)
SODIUM: 138 meq/L (ref 135–145)
TOTAL PROTEIN: 6.7 g/dL (ref 6.0–8.3)

## 2016-03-28 NOTE — Telephone Encounter (Signed)
Notified pt and she sends thanks.

## 2016-03-28 NOTE — Telephone Encounter (Signed)
I have not sent in anything for the patient. I am assuming she just means the 3-page form from yesterday given is not the correct one and I will shred. I am not in the office all day -- just here for a couple hours this morning (as discussed at yesterday's visit) so I will not receive the forms potentially until tomorrow. I cannot promise I will be able to complete before the end of day tomorrow if they are not received in the next couple of hours before I leave.  I will make sure to look for her papers before leaving this morning.

## 2016-03-28 NOTE — Telephone Encounter (Signed)
I called the patient and left a message on mobile number to call back to schedule appt with Dr Lucia GaskinsAhern for next month.

## 2016-03-28 NOTE — Telephone Encounter (Signed)
I have received everything -- I pulled the forms out of Toni [name]'s bin so I can work on them this evening for the patient. Please let her know so she won't worry. Will call her tomorrow once forms are ready to pick up.

## 2016-03-28 NOTE — Telephone Encounter (Signed)
Can be reached: [phone]  Reason for call: pt called stating that we sent in 3 pages but it should be 14 pages. She said that she needs it to be done asap. [facility] She states the 3 pages we received needs shredded and the 14 pages need to be completed before Selena BattenCody goes out of office for a week. She is faxing in the 14 page document today. We still need to send back to the place on the original coversheet that was with the 3 page document. Again pt [facility].

## 2016-03-28 NOTE — Telephone Encounter (Signed)
I was out of the office yesterday, I will forward to Saintclair HalstedJessica G.    KP

## 2016-03-29 ENCOUNTER — Telehealth: Payer: Self-pay | Admitting: Physician Assistant

## 2016-03-29 NOTE — Telephone Encounter (Signed)
Patient needs FMLA dated back to 03/21/2016 if possible

## 2016-03-29 NOTE — Telephone Encounter (Signed)
It was ! I took care of it!

## 2016-03-29 NOTE — Telephone Encounter (Signed)
FMLA is ready for pick up.  There was a return to work form attached. I have kept in my office to fill out once neurology determines when she can return to work.

## 2016-04-01 ENCOUNTER — Encounter: Payer: Self-pay | Admitting: Family Medicine

## 2016-04-01 ENCOUNTER — Ambulatory Visit (INDEPENDENT_AMBULATORY_CARE_PROVIDER_SITE_OTHER): Payer: 59 | Admitting: Family Medicine

## 2016-04-01 VITALS — BP 138/102 | HR 89 | Temp 99.1°F | Resp 16 | Ht 65.0 in | Wt 257.5 lb

## 2016-04-01 DIAGNOSIS — K909 Intestinal malabsorption, unspecified: Secondary | ICD-10-CM

## 2016-04-01 DIAGNOSIS — F411 Generalized anxiety disorder: Secondary | ICD-10-CM

## 2016-04-01 DIAGNOSIS — D6800 Von Willebrand disease, unspecified: Secondary | ICD-10-CM

## 2016-04-01 DIAGNOSIS — E162 Hypoglycemia, unspecified: Secondary | ICD-10-CM

## 2016-04-01 DIAGNOSIS — E611 Iron deficiency: Secondary | ICD-10-CM | POA: Diagnosis not present

## 2016-04-01 DIAGNOSIS — R400 Somnolence: Secondary | ICD-10-CM | POA: Diagnosis not present

## 2016-04-01 DIAGNOSIS — T887XXS Unspecified adverse effect of drug or medicament, sequela: Secondary | ICD-10-CM

## 2016-04-01 DIAGNOSIS — T50905S Adverse effect of unspecified drugs, medicaments and biological substances, sequela: Secondary | ICD-10-CM

## 2016-04-01 DIAGNOSIS — E559 Vitamin D deficiency, unspecified: Secondary | ICD-10-CM | POA: Diagnosis not present

## 2016-04-01 LAB — IRON AND TIBC
%SAT: 31 % (ref 11–50)
IRON: 133 ug/dL (ref 40–190)
TIBC: 427 ug/dL (ref 250–450)
UIBC: 294 ug/dL (ref 125–400)

## 2016-04-01 LAB — BASIC METABOLIC PANEL
BUN: 7 mg/dL (ref 6–23)
CO2: 21 mEq/L (ref 19–32)
Calcium: 9.2 mg/dL (ref 8.4–10.5)
Chloride: 104 mEq/L (ref 96–112)
Creatinine, Ser: 0.84 mg/dL (ref 0.40–1.20)
GFR: 96.36 mL/min (ref >60.00)
GLUCOSE: 75 mg/dL (ref 70–99)
POTASSIUM: 4.1 meq/L (ref 3.5–5.1)
SODIUM: 137 meq/L (ref 135–145)

## 2016-04-01 LAB — CBC WITH DIFFERENTIAL/PLATELET
BASOS PCT: 0.4 % (ref 0.0–3.0)
Basophils Absolute: 0 10*3/uL (ref 0.0–0.1)
EOS PCT: 0.1 % (ref 0.0–5.0)
Eosinophils Absolute: 0 10*3/uL (ref 0.0–0.7)
HCT: 41.9 % (ref 36.0–46.0)
Hemoglobin: 13.9 g/dL (ref 12.0–15.0)
LYMPHS ABS: 2.5 10*3/uL (ref 0.7–4.0)
Lymphocytes Relative: 28.9 % (ref 12.0–46.0)
MCHC: 33.1 g/dL (ref 30.0–36.0)
MCV: 86.3 fl (ref 78.0–100.0)
MONO ABS: 0.4 10*3/uL (ref 0.1–1.0)
Monocytes Relative: 4 % (ref 3.0–12.0)
NEUTROS ABS: 5.9 10*3/uL (ref 1.4–7.7)
NEUTROS PCT: 66.6 % (ref 43.0–77.0)
Platelets: 97 10*3/uL — ABNORMAL LOW (ref 150.0–400.0)
RBC: 4.86 Mil/uL (ref 3.87–5.11)
RDW: 14.9 % (ref 11.5–15.5)
WBC: 8.8 10*3/uL (ref 4.0–10.5)

## 2016-04-01 LAB — VITAMIN D 25 HYDROXY (VIT D DEFICIENCY, FRACTURES): VITD: 38.21 ng/mL (ref 30.00–100.00)

## 2016-04-01 LAB — IRON: IRON: 137 ug/dL (ref 42–145)

## 2016-04-01 LAB — FERRITIN: Ferritin: 44.8 ng/mL (ref 10.0–291.0)

## 2016-04-01 MED ORDER — DULOXETINE HCL 30 MG PO CPEP: 30.0000 mg | ORAL_CAPSULE | Freq: Every day | ORAL | Status: DC

## 2016-04-01 MED FILL — MONO-LINYAH 28 TABLET: 0.25-35 | 84 days supply | Qty: 84 | Fill #0

## 2016-04-01 MED FILL — DULoxetine HCL 30 MG CPEP: 30 | 30 days supply | Qty: 30 | Fill #0

## 2016-04-01 NOTE — Progress Notes (Signed)
Office Note 04/02/2016  CC:  Chief Complaint  Patient presents with  . Establish Care    [facility]    HPI:  Toni [name] is a 40 y.o. White female who is here accompanied by her husband to transfer/establish care. Patient's most recent primary MD: Malva Coganody Martin, PA [facility] [place]. Old records in EPIC/HL EMR were reviewed prior to or during today's visit.  Pt discusses recent hospitalization 6/15-6/18, 2017. Admitted with altered mental status/staring spell, syncopal event.  The data seemed to potentially support seizure-like activity, but further investigations began to support more of an acute cognitive impairment with somnolence that may have come from taking robaxin and xanax at the same time--something which she had never done before.  Neurology consulted on her in hospital: CT head normal, MRI head normal, EEG normal. Mental status returned to normal.  She was also found to be hypoglycemic in the hospital, which could certainly have been contributing to her altered mentation.  Work up for hypoglycemia unrevealing.  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 After discharge, she did continue to have some low glucoses, but as she began to eat more consistent small frequent meals these have lessened in frequency and severity.   She has had no further mental status alterations or anything resembling seizure-like activity. She has been out of work since the 15th, feeling unable to return after initial d/c due to generalized weakness and some ongoing hypoglycemia---so FMLA paperwork was completed. She is hopeful that I will complete her short term disability paperwork for the same time period as her FMLA (03/21/16-04/21/16). She is voicing desire to get back to work as quick as possible AND she asks if neurology referral is needed.  Pt suffers from significant generalized anxiety and  anxiety-related insomnia.  Amitriptyline 25mg  qhs usually helps some but didn't last night--got 1 hour of sleep.  Describes stress in family life. Has been on zoloft in the past, but stopped this at the suggestion of her GYN at some point b/c he felt it was contraindicated in someone who had a past seizure (see PMH section).  She can't recall if it helped her or not.  Xanax is what she was put on after that and after her recent troubles with this med and subsequent hospitalization, she has stopped taking this med completely and is in favor of trying a different antidepressant and staying off benzos.    Hx of some low vit D and B12 and iron in the past due to her gastric bipass malabsorption.  She has tried to be good with taking supplements but hasn't always been able to stick with it, plus iron gives her significant GI upset/constipation.  She just finished high dose vit D replacement for 12 weeks--finished about 1-2 wks ago.   Past Medical History  Diagnosis Date  . Syncope     with ? seizure in 2015, eval with neruo  . Migraine   . GERD (gastroesophageal reflux disease)     hx gastric ulcer and upper GI bleed, ? NSAID induced  . Anxiety and depression     started with PPD  . Allergy   . Hypertension   . Bacterial vaginitis   . Vitamin D deficiency   . Vitamin B12 deficiency   . Chronic neck pain     sees [place] and [facility] for neck strain  . Seizure ([facility])  s/p eval with neuro, no medication, no events since  . History of stomach ulcers   . Dietary iron deficiency without anemia   . Von Willebrand's disease [facility])     --need Dr. Shirline Frees records    Past Surgical History  Procedure Laterality Date  . Gastric bypass  2002  . Cesarean section  2006  . Tubuligation  2006  . Tonsillectomy and adenoidectomy    . Tubes in ears both Bilateral   . Eye surgery Bilateral     2001  . Endometrial biopsy      Family History  Problem Relation Age of Onset  . Arthritis Mother   .  Hyperlipidemia Mother   . Heart disease Mother   . Diabetes Mother   . Asthma Mother   . Aneurysm Father     deceased  . Lung cancer Maternal Aunt     smoker  . Colon cancer Maternal Grandmother   . Lung cancer Maternal Aunt     smoker    Social History   Social History  . Marital Status: Married    Spouse Name: N/A  . Number of Children: N/A  . Years of Education: N/A   Occupational History  . Not on file.   Social History Main Topics  . Smoking status: Never Smoker   . Smokeless tobacco: Never Used  . Alcohol Use: 0.0 oz/week    0 Standard drinks or equivalent per week     Comment: occasional   . Drug Use: No  . Sexual Activity: Not on file   Other Topics Concern  . Not on file   Social History Narrative   Married    Outpatient Encounter Prescriptions as of 04/01/2016  Medication Sig  . acetaminophen (TYLENOL) 500 MG tablet Take 1,000 mg by mouth every 6 (six) hours as needed for mild pain, moderate pain or headache.  Marland Kitchen amitriptyline (ELAVIL) 25 MG tablet Take 1 tablet (25 mg total) by mouth at bedtime.  Marland Kitchen BIOTIN PO Take 1 tablet by mouth daily.   . Calcium 250 MG CAPS Take 1 capsule by mouth daily.   . Cholecalciferol (VITAMIN D) 2000 UNITS CAPS Take 1 capsule by mouth daily.   Marland Kitchen CRANBERRY EXTRACT PO Take 1 capsule by mouth daily as needed (for symptoms).  Beverlee Nims 0.25-35 MG-MCG tablet Take 1 tablet by mouth once daily  . omeprazole (PRILOSEC) 20 MG capsule TAKE 1 CAPSULE BY MOUTH 2 TIMES DAILY BEFORE A MEAL  . oxyCODONE-acetaminophen (PERCOCET) 10-325 MG per tablet Take 1 tablet by mouth every 8 (eight) hours as needed for pain. Take 1 tablet by mouth every 8 hours as needed for severe pain. (Patient taking differently: Take 1 tablet by mouth every 8 (eight) hours as needed for pain. )  . vitamin B-12 (CYANOCOBALAMIN) 1000 MCG tablet Take 1,000 mcg by mouth daily.  . [DISCONTINUED] alprazolam (XANAX) 2 MG tablet Take 0.5 tablets (1 mg total) by mouth 3  (three) times daily as needed for anxiety.  . DULoxetine (CYMBALTA) 30 MG capsule Take 1 capsule (30 mg total) by mouth daily.  Marland Kitchen eletriptan (RELPAX) 40 MG tablet Take 1 tablet (40 mg total) by mouth as needed for migraine or headache. One tablet by mouth at onset of headache. May repeat in 2 hours if headache persists or recurs. (Patient not taking: Reported on 04/01/2016)  . FeFum-FePoly-FA-B Cmp-C-Biot (INTEGRA PLUS) CAPS 1 PO daily for anemia (Patient not taking: Reported on 04/01/2016)  . ondansetron (ZOFRAN) 8 MG tablet  Take by mouth every 8 (eight) hours as needed for nausea or vomiting. Reported on 04/01/2016  . [DISCONTINUED] Vitamin D, Ergocalciferol, (DRISDOL) 50000 units CAPS capsule Take 1 capsule (50,000 units) by mouth every 7 days for 10 weeks. (Patient not taking: Reported on 04/01/2016)   No facility-administered encounter medications on file as of 04/01/2016.    Allergies  Allergen Reactions  . Ibuprofen     Causes ulcers    ROS Review of Systems  Constitutional: Positive for fatigue. Negative for fever.  HENT: Negative for congestion and sore throat.   Eyes: Negative for visual disturbance.  Respiratory: Negative for cough and shortness of breath.   Cardiovascular: Negative for chest pain.  Gastrointestinal: Negative for nausea and abdominal pain.  Genitourinary: Negative for dysuria.  Musculoskeletal: Negative for back pain and joint swelling.  Skin: Negative for rash.  Neurological: Negative for weakness and headaches.  Hematological: Negative for adenopathy.    PE; Blood pressure 138/102, pulse 89, temperature 99.1 F (37.3 C), temperature source Oral, resp. rate 16, height  (1.651 m), weight 257 lb 8 oz (116.801 kg), last menstrual period 03/06/2016, SpO2 100 %. Gen: Alert, well appearing.  Patient is oriented to person, place, time, and situation. AFFECT: pleasant, lucid thought and speech. ZOX:WRUE: no injection, icteris, swelling, or exudate.  EOMI,  PERRLA. Mouth: lips without lesion/swelling.  Oral mucosa pink and moist. Oropharynx without erythema, exudate, or swelling.  CV: RRR, no m/r/g.   LUNGS: CTA bilat, nonlabored resps, good aeration in all lung fields. ABD: soft, NT/ND EXT: no clubbing, cyanosis, or edema.  Neuro: CN 2-12 intact bilaterally, strength 5/5 in proximal and distal upper extremities and lower extremities bilaterally.    No tremor. No ataxia.  Upper extremity and lower extremity DTRs symmetric.  No pronator drift.  Pertinent labs:  Lab Results  Component Value Date   TSH 1.596 03/21/2016   Lab Results  Component Value Date   WBC 8.8 04/01/2016   HGB 13.9 04/01/2016   HCT 41.9 04/01/2016   MCV 86.3 04/01/2016   PLT 97.0* 04/01/2016   Lab Results  Component Value Date   CREATININE 0.84 04/01/2016   BUN 7 04/01/2016   NA 137 04/01/2016   K 4.1 04/01/2016   CL 104 04/01/2016   CO2 21 04/01/2016   Lab Results  Component Value Date   ALT 12 03/27/2016   AST 14 03/27/2016   ALKPHOS 58 03/27/2016   BILITOT 0.5 03/27/2016   Lab Results  Component Value Date   CHOL 185 11/24/2015   Lab Results  Component Value Date   HDL 64.30 11/24/2015   Lab Results  Component Value Date   LDLCALC 97 11/24/2015   Lab Results  Component Value Date   TRIG 119.0 11/24/2015   Lab Results  Component Value Date   CHOLHDL 3 11/24/2015   Lab Results  Component Value Date   HGBA1C 5.1 03/21/2016   Lab Results  Component Value Date   IRON 137 04/01/2016   IRON 133 04/01/2016   TIBC 427 04/01/2016   FERRITIN 44.8 04/01/2016   ASSESSMENT AND PLAN:   New/Transfer pt:  1) Altered mental status believed to be secondary to medication interaction and hypoglycemia: resolved. Neuro's input in the hospital endorsed med interaction as likely cause of her symptoms, and there was no significant convincing data to support seizure activity as the cause.  I told pt today that I did not think it was imperative that she  return to neurologist as  an outpatient.  She expressed understanding and agreed with this approach.  She is avoiding further use of both xanax and robaxin, and she is working on eating habits that will avoid hypoglycemia.  I will complete her short term disability paperwork for the time period of 6/15-7/16, 2017. However, she wants to return to work as quickly as possible so we'll re-evaluate her in 10-14 days to see if she can possibly go back to work before 04/21/16.  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 Recheck vit D level.  Her vit B12 in hosp recently was normal so we won't recheck this. She wants to try taking only oral vit b12 supplement-stopping her current 1000 mcg IM q month vit B12--and we'll recheck B12 levels after about a month of this. We'll recheck ferritin, iron, TIBC today. She is going to concentrate on eating small, frequent meals to avoid hypoglycemia.  3) GAD: start trial of duloxetine 30mg  qd.  Therapeutic expectations and side effect profile of medication discussed today.  Patient's questions answered. She can continue her 25mg  of amitriptyline qhs for her insomnia.  4) HTN: this dx is on her PMH list but she is not on any medication.  Diastolic bp up a little today. Observe for now.  An After Visit Summary was printed and given to the patient.  Spent 50 min with pt today, with >50% of this time spent in counseling and care coordination regarding the above problems.  Return for 10-14d f/u anxiety (30 min).  Signed:  Santiago BumpersPhil McGowen, MD           04/02/2016

## 2016-04-01 NOTE — Progress Notes (Signed)
Pre visit review using our clinic review tool, if applicable. No additional management support is needed unless otherwise documented below in the visit note. 

## 2016-04-02 ENCOUNTER — Encounter: Payer: Self-pay | Admitting: Family Medicine

## 2016-04-03 ENCOUNTER — Telehealth: Payer: Self-pay | Admitting: *Deleted

## 2016-04-03 ENCOUNTER — Encounter: Payer: Self-pay | Admitting: Family Medicine

## 2016-04-03 NOTE — Telephone Encounter (Signed)
Filled out as much as possible and forwarded to Cody. JG//CMA  

## 2016-04-04 ENCOUNTER — Encounter: Payer: Self-pay | Admitting: *Deleted

## 2016-04-10 ENCOUNTER — Ambulatory Visit: Payer: 59 | Admitting: Family Medicine

## 2016-04-11 ENCOUNTER — Ambulatory Visit (INDEPENDENT_AMBULATORY_CARE_PROVIDER_SITE_OTHER): Payer: 59 | Admitting: Family Medicine

## 2016-04-11 ENCOUNTER — Encounter: Payer: Self-pay | Admitting: Family Medicine

## 2016-04-11 VITALS — BP 134/89 | HR 78 | Temp 98.1°F | Resp 20 | Wt 255.2 lb

## 2016-04-11 DIAGNOSIS — R4182 Altered mental status, unspecified: Secondary | ICD-10-CM | POA: Insufficient documentation

## 2016-04-11 DIAGNOSIS — E162 Hypoglycemia, unspecified: Secondary | ICD-10-CM

## 2016-04-11 DIAGNOSIS — D696 Thrombocytopenia, unspecified: Secondary | ICD-10-CM

## 2016-04-11 DIAGNOSIS — F39 Unspecified mood [affective] disorder: Secondary | ICD-10-CM

## 2016-04-11 LAB — CBC
HEMATOCRIT: 41.2 % (ref 35.0–45.0)
HEMOGLOBIN: 13.7 g/dL (ref 11.7–15.5)
MCH: 28.7 pg (ref 27.0–33.0)
MCHC: 33.3 g/dL (ref 32.0–36.0)
MCV: 86.2 fL (ref 80.0–100.0)
MPV: 10.9 fL (ref 7.5–12.5)
Platelets: 337 10*3/uL (ref 140–400)
RBC: 4.78 MIL/uL (ref 3.80–5.10)
RDW: 15 % (ref 11.0–15.0)
WBC: 8.3 10*3/uL (ref 3.8–10.8)

## 2016-04-11 MED ORDER — DULOXETINE HCL 60 MG PO CPEP: 60.0000 mg | ORAL_CAPSULE | Freq: Every day | ORAL | Status: DC

## 2016-04-11 NOTE — Progress Notes (Addendum)
OFFICE VISIT  04/11/2016   CC:  Chief Complaint  Patient presents with  . Anxiety    follow up    HPI:    Patient is a 40 y.o. Caucasian female who presents for 10 day f/u:  1) Anxiety/depression--feels like she is not "flying off the handle" quite as easily as she was before starting duloxetine.  Has only been on this med 10d now.  Still has constant worry, though.  Denies any side effects from cymbalta.  2) Insomnia: has maintenance insomnia, likely a symptom of her anx/dep.  Wakes up a few hours after going to sleep and ruminates on things she has to get done, etc.  Can't fall back asleep often. Trazodone caused too much morning somnolence/hangover effect.  Amitriptyline at both the 10mg  and the 25mg  dose have led to no improvement.  In the past, she tried [place] and it caused parasomnia behavior. She hasn't tried any other sleep aids.  3) Altered mental status: she has had no episodes of any staring spells or altered cognitive function since I last saw her.  No seizure-like activity.  She admits to lifelong problem with short term memory loss that is secondary to her mind being overloaded with stress/anxiety/worry/responsibility.  This has not worsened any compared to before her recent hospitalization.  4) Hypoglycemia: she has not had any hypoglycemia since her last visit here. Fasting glucoses range 70-85, without any hypoglycemia symptoms. Occasional 2H PP check has been around 100.   She has started eating more frequent, smaller meals and trying to keep them protein and fiber-rich.  Also trying to avoid too much simple carbs.    She says she feels well and wants to return to work 04/15/16.  She knows she will have stressors starting back and being behind but says she is prepared for this and says her boss and coworkers are very understanding.  Past Medical History  Diagnosis Date  . Syncope     with ? seizure in 2015, eval with neruo  . Migraine   . GERD (gastroesophageal  reflux disease)     hx gastric ulcer and upper GI bleed, ? NSAID induced  . Anxiety and depression     started with PPD  . Allergy   . Hypertension   . Bacterial vaginitis   . Vitamin D deficiency   . Vitamin B12 deficiency   . Chronic neck pain     sees [facility] for neck strain  . Seizure (HCC) 2015    s/p eval with neuro, no medication, no events since  . History of stomach ulcers   . Dietary iron deficiency without anemia   . Von Willebrand's disease [facility](HCC)     --need Dr. Shirline FreesMohammed records    Past Surgical History  Procedure Laterality Date  . Gastric bypass  2002  . Cesarean section  2006  . Tubuligation  2006  . Tonsillectomy and adenoidectomy    . Tubes in ears both Bilateral   . Eye surgery Bilateral     2001  . Endometrial biopsy      Outpatient Prescriptions Prior to Visit  Medication Sig Dispense Refill  . acetaminophen (TYLENOL) 500 MG tablet Take 1,000 mg by mouth every 6 (six) hours as needed for mild pain, moderate pain or headache.    Marland Kitchen. amitriptyline (ELAVIL) 25 MG tablet Take 1 tablet (25 mg total) by mouth at bedtime. 30 tablet 5  . BIOTIN PO Take 1 tablet by mouth daily.     .Marland Kitchen  Calcium 250 MG CAPS Take 1 capsule by mouth daily.     . Cholecalciferol (VITAMIN D) 2000 UNITS CAPS Take 1 capsule by mouth daily.     Marland Kitchen. CRANBERRY EXTRACT PO Take 1 capsule by mouth daily as needed (for symptoms).    . FeFum-FePoly-FA-B Cmp-C-Biot (INTEGRA PLUS) CAPS 1 PO daily for anemia 30 capsule 3  . MONONESSA 0.25-35 MG-MCG tablet Take 1 tablet by mouth once daily  4  . omeprazole (PRILOSEC) 20 MG capsule TAKE 1 CAPSULE BY MOUTH 2 TIMES DAILY BEFORE A MEAL 60 capsule 11  . oxyCODONE-acetaminophen (PERCOCET) 10-325 MG per tablet Take 1 tablet by mouth every 8 (eight) hours as needed for pain. Take 1 tablet by mouth every 8 hours as needed for severe pain. (Patient taking differently: Take 1 tablet by mouth every 8 (eight) hours as needed for pain. ) 15 tablet 0  .  vitamin B-12 (CYANOCOBALAMIN) 1000 MCG tablet Take 1,000 mcg by mouth daily.    . DULoxetine (CYMBALTA) 30 MG capsule Take 1 capsule (30 mg total) by mouth daily. 30 capsule 0  . eletriptan (RELPAX) 40 MG tablet Take 1 tablet (40 mg total) by mouth as needed for migraine or headache. One tablet by mouth at onset of headache. May repeat in 2 hours if headache persists or recurs. (Patient not taking: Reported on 04/01/2016) 10 tablet 3  . ondansetron (ZOFRAN) 8 MG tablet Take by mouth every 8 (eight) hours as needed for nausea or vomiting. Reported on 04/11/2016     No facility-administered medications prior to visit.    Allergies  Allergen Reactions  . Ibuprofen     Causes ulcers    ROS As per HPI  PE: Blood pressure 134/89, pulse 78, temperature 98.1 F (36.7 C), resp. rate 20, weight 255 lb 4 oz (115.781 kg), last menstrual period 04/07/2016, SpO2 98 %. Wt Readings from Last 2 Encounters:  04/11/16 255 lb 4 oz (115.781 kg)  04/01/16 257 lb 8 oz (116.801 kg)    Gen: alert, oriented x 4, affect pleasant.  Lucid thinking and conversation noted. HEENT: PERRLA, EOMI.   Neck: no LAD, mass, or thyromegaly. CV: RRR, no m/r/g LUNGS: CTA bilat, nonlabored. NEURO: no tremor or tics noted on observation.  Coordination intact. CN 2-12 grossly intact bilaterally, strength 5/5 in all extremeties.  No ataxia.   LABS:  Lab Results  Component Value Date   TSH 1.596 03/21/2016   Lab Results  Component Value Date   WBC 8.8 04/01/2016   HGB 13.9 04/01/2016   HCT 41.9 04/01/2016   MCV 86.3 04/01/2016   PLT 97.0* 04/01/2016   Lab Results  Component Value Date   CREATININE 0.84 04/01/2016   BUN 7 04/01/2016   NA 137 04/01/2016   K 4.1 04/01/2016   CL 104 04/01/2016   CO2 21 04/01/2016   Lab Results  Component Value Date   ALT 12 03/27/2016   AST 14 03/27/2016   ALKPHOS 58 03/27/2016   BILITOT 0.5 03/27/2016   Lab Results  Component Value Date   CHOL 185 11/24/2015   Lab  Results  Component Value Date   HDL 64.30 11/24/2015   Lab Results  Component Value Date   LDLCALC 97 11/24/2015   Lab Results  Component Value Date   TRIG 119.0 11/24/2015   Lab Results  Component Value Date   CHOLHDL 3 11/24/2015   Lab Results  Component Value Date   IRON 137 04/01/2016   IRON 133 04/01/2016  TIBC 427 04/01/2016   FERRITIN 44.8 04/01/2016   Lab Results  Component Value Date   VITAMINB12 760 03/22/2016   Vit D level on 04/01/16: 38.21   IMPRESSION AND PLAN:  1) Hx of AMS, most likely secondary to adverse medication interaction: resolved.  No further occurrence of this.  She is staying away from benzodiazepines and the muscle relaxer robaxin--the two culprit meds.  2) Anxiety and depression: mildly improved, secondary to having some time off work as much as anything, but possibly also feeling a bit of improvement from  duloxetine.  We'll increase this to  qd now.  3) Hypoglycemia: this is resolved.  Her eating habits are much improved.  She'll continue with small, frequent meals that are low in simple carbs, high in protein/complex carbs/fiber.   No sign of dumping syndrome.  4) Thrombocytopenia: isolated finding on labs done last visit.  Repeat CBC today.  5) Insomnia: secondary to chronic anxiety.  Hopefully we'll see this naturally improve as her cymbalta helps her more.  She described pretty decent sleep hygiene.   Instructions: Take amitriptyline  +  at bedtime for 3 nights.  If not helpful for sleep, then take TWO  amitriptyline pills at bedtime.  Therapeutic expectations and side effect profile of medication discussed today.  Patient's questions answered.   Letter written today stating that she may return to work 04/15/16.  An After Visit Summary was printed and given to the patient.  FOLLOW UP: Return in about 3 weeks (around 05/02/2016) for routine chronic illness f/u (30 min). at which time we'll check vit B12 level to see  how her level is holding up on taking PO vit B12 only (no IM).  Signed:  Santiago Bumpers, MD           04/11/2016

## 2016-04-11 NOTE — Patient Instructions (Signed)
Take amitriptyline 25mg  + 10mg  at bedtime for 3 nights.  If not helpful for sleep, then take TWO 25mg  amitriptyline pills at bedtime.  Take TWO of the cymbalta 30mg  tabs once daily.  When you run out of these pills, fill rx at pharmacy for 60mg  cymbalta.

## 2016-04-11 NOTE — Telephone Encounter (Signed)
Also received Disability paperwork - seems that Dr. Milinda CaveMcGowen has already filled out after she transferred to him. Please verify this with patient and I will shred forms.

## 2016-04-12 NOTE — Telephone Encounter (Signed)
Spoke with patient RE: Toni [name] paperwork and she states that Dr. Milinda CaveMcGowen has completed and we can shred our copy; paperwork shred and provider informed/SLS 07/07

## 2016-04-17 MED FILL — AMITRIPTYLINE HCL 10 MG TAB: 10 | 30 days supply | Qty: 30 | Fill #2

## 2016-04-17 MED FILL — OMEPRAZOLE DR 20 MG CAPSULE: 20 | 30 days supply | Qty: 60 | Fill #0

## 2016-04-17 MED FILL — DULoxetine HCL 60 MG CPEP: 60 | 30 days supply | Qty: 30 | Fill #0

## 2016-04-17 MED FILL — AMITRIPTYLINE HCL 25 MG TAB: 25 | 30 days supply | Qty: 30 | Fill #1

## 2016-04-17 MED FILL — FOLIVANE-PLUS CAPSULE: 30 days supply | Qty: 30 | Fill #2

## 2016-04-22 ENCOUNTER — Encounter: Payer: Self-pay | Admitting: Family Medicine

## 2016-04-22 NOTE — Telephone Encounter (Signed)
Please advise. Thanks.  

## 2016-04-24 ENCOUNTER — Ambulatory Visit: Payer: 59

## 2016-05-02 ENCOUNTER — Encounter: Payer: Self-pay | Admitting: Family Medicine

## 2016-05-02 ENCOUNTER — Ambulatory Visit (INDEPENDENT_AMBULATORY_CARE_PROVIDER_SITE_OTHER): Payer: 59 | Admitting: Family Medicine

## 2016-05-02 VITALS — BP 117/81 | HR 89 | Temp 98.5°F | Resp 16 | Ht 65.0 in | Wt 268.0 lb

## 2016-05-02 DIAGNOSIS — Z9884 Bariatric surgery status: Secondary | ICD-10-CM | POA: Diagnosis not present

## 2016-05-02 DIAGNOSIS — E539 Vitamin B deficiency, unspecified: Secondary | ICD-10-CM

## 2016-05-02 DIAGNOSIS — K909 Intestinal malabsorption, unspecified: Secondary | ICD-10-CM | POA: Diagnosis not present

## 2016-05-02 DIAGNOSIS — F418 Other specified anxiety disorders: Secondary | ICD-10-CM | POA: Diagnosis not present

## 2016-05-02 DIAGNOSIS — F32A Depression, unspecified: Secondary | ICD-10-CM

## 2016-05-02 DIAGNOSIS — F5105 Insomnia due to other mental disorder: Secondary | ICD-10-CM

## 2016-05-02 DIAGNOSIS — E162 Hypoglycemia, unspecified: Secondary | ICD-10-CM

## 2016-05-02 DIAGNOSIS — F329 Major depressive disorder, single episode, unspecified: Secondary | ICD-10-CM

## 2016-05-02 DIAGNOSIS — F419 Anxiety disorder, unspecified: Secondary | ICD-10-CM

## 2016-05-02 MED ORDER — CYANOCOBALAMIN 1000 MCG/ML IJ SOLN
1000.0000 ug | Freq: Once | INTRAMUSCULAR | Status: AC
  Administered 2016-05-02: 1000 ug via INTRAMUSCULAR

## 2016-05-02 NOTE — Progress Notes (Signed)
OFFICE VISIT  05/02/2016  CC:  Chief Complaint  Patient presents with  . Follow-up   HPI:    Patient is a 40 y.o. Caucasian female who presents for 3 week f/u anxiety/stress/depression--I increased her cymbalta to  qd last.  visit.  Says she is doing well, feels like the  cymbalta is helping a lot with her stress/anxiety/depression.  She does note decreased libido on the med but does not think this is a big problem.  Also some constipation that she feels is from duloxetine.  We also made some up-titration to her amitriptyline for her anxiety-related insomnia last visit: taking  +  amitrip qhs and this is helping a lot. Takes her iron pill once a week minimum. She is compliant with otc vit D 2000 U daily. She has been doing well with eating small meals regularly to avoid GI upset AND avoid the feeling of hypoglycemia that she sometimes gets when going too long between meals.  Past Medical History:  Diagnosis Date  . Allergy   . Anxiety and depression    started with PPD  . Bacterial vaginitis   . Chronic neck pain    sees [facility] for neck strain  . Dietary iron deficiency without anemia   . GERD (gastroesophageal reflux disease)    hx gastric ulcer and upper GI bleed, ? NSAID induced  . History of stomach ulcers   . Hypertension   . Migraine   . Seizure (HCC) 2015   s/p eval with neuro, no medication, no events since  . Syncope    with ? seizure in 2015, eval with neruo  . Vitamin B12 deficiency   . Vitamin D deficiency   . Von Willebrand's disease [facility])    --need Dr. Shirline Frees records    Past Surgical History:  Procedure Laterality Date  . CESAREAN SECTION  2006  . ENDOMETRIAL BIOPSY    . EYE SURGERY Bilateral    2001  . GASTRIC BYPASS  2002  . TONSILLECTOMY AND ADENOIDECTOMY    . tubes in ears both Bilateral   . tubuligation  2006    Outpatient Medications Prior to Visit  Medication Sig Dispense Refill  . acetaminophen (TYLENOL)  500 MG tablet Take 1,000 mg by mouth every 6 (six) hours as needed for mild pain, moderate pain or headache.    Marland Kitchen amitriptyline (ELAVIL) 25 MG tablet Take 1 tablet (25 mg total) by mouth at bedtime. 30 tablet 5  . BIOTIN PO Take 1 tablet by mouth daily.     . Calcium 250 MG CAPS Take 1 capsule by mouth daily.     . Cholecalciferol (VITAMIN D) 2000 UNITS CAPS Take 1 capsule by mouth daily.     Marland Kitchen CRANBERRY EXTRACT PO Take 1 capsule by mouth daily as needed (for symptoms).    . DULoxetine (CYMBALTA) 60 MG capsule Take 1 capsule (60 mg total) by mouth daily. 30 capsule 3  . FeFum-FePoly-FA-B Cmp-C-Biot (INTEGRA PLUS) CAPS 1 PO daily for anemia 30 capsule 3  . MONONESSA 0.25-35 MG-MCG tablet Take 1 tablet by mouth once daily  4  . omeprazole (PRILOSEC) 20 MG capsule TAKE 1 CAPSULE BY MOUTH 2 TIMES DAILY BEFORE A MEAL 60 capsule 11  . oxyCODONE-acetaminophen (PERCOCET) 10-325 MG per tablet Take 1 tablet by mouth every 8 (eight) hours as needed for pain. Take 1 tablet by mouth every 8 hours as needed for severe pain. (Patient taking differently: Take 1 tablet by mouth every 8 (  eight) hours as needed for pain. ) 15 tablet 0  . vitamin B-12 (CYANOCOBALAMIN) 1000 MCG tablet Take 1,000 mcg by mouth daily.    Marland Kitchen eletriptan (RELPAX) 40 MG tablet Take 1 tablet (40 mg total) by mouth as needed for migraine or headache. One tablet by mouth at onset of headache. May repeat in 2 hours if headache persists or recurs. (Patient not taking: Reported on 04/01/2016) 10 tablet 3  . ondansetron (ZOFRAN) 8 MG tablet Take by mouth every 8 (eight) hours as needed for nausea or vomiting. Reported on 04/11/2016     No facility-administered medications prior to visit.     Allergies  Allergen Reactions  . Ibuprofen     Causes ulcers    ROS As per HPI  PE: Blood pressure 117/81, pulse 89, temperature 98.5 F (36.9 C), temperature source Oral, resp. rate 16, height 5\' 5"  (1.651 m), weight 268 lb (121.6 kg), last menstrual  period 04/07/2016, SpO2 100 %. Wt Readings from Last 2 Encounters:  05/02/16 268 lb (121.6 kg)  04/11/16 255 lb 4 oz (115.8 kg)    Gen: alert, oriented x 4, affect pleasant.  Lucid thinking and conversation noted. HEENT: PERRLA, EOMI.   Neck: no LAD, mass, or thyromegaly. CV: RRR, no m/r/g LUNGS: CTA bilat, nonlabored. NEURO: no tremor or tics noted on observation.  Coordination intact. CN 2-12 grossly intact bilaterally, strength 5/5 in all extremeties.  No ataxia.   LABS:  Lab Results  Component Value Date   TSH 1.596 03/21/2016   Lab Results  Component Value Date   WBC 8.3 04/11/2016   HGB 13.7 04/11/2016   HCT 41.2 04/11/2016   MCV 86.2 04/11/2016   PLT 337 04/11/2016   Lab Results  Component Value Date   CREATININE 0.84 04/01/2016   BUN 7 04/01/2016   NA 137 04/01/2016   K 4.1 04/01/2016   CL 104 04/01/2016   CO2 21 04/01/2016   Lab Results  Component Value Date   ALT 12 03/27/2016   AST 14 03/27/2016   ALKPHOS 58 03/27/2016   BILITOT 0.5 03/27/2016   Lab Results  Component Value Date   CHOL 185 11/24/2015   Lab Results  Component Value Date   HDL 64.30 11/24/2015   Lab Results  Component Value Date   LDLCALC 97 11/24/2015   Lab Results  Component Value Date   TRIG 119.0 11/24/2015   Lab Results  Component Value Date   CHOLHDL 3 11/24/2015   Lab Results  Component Value Date   VITAMINB12 760 03/22/2016   Lab Results  Component Value Date   IRON 137 04/01/2016   IRON 133 04/01/2016   TIBC 427 04/01/2016   FERRITIN 44.8 04/01/2016   25 OH vit D was 38.21 on 04/01/16.  IMPRESSION AND PLAN:  1) Anxiety/depression: much improved.  Continue duloxetine 60mg  qd.  Hopefully libido and constipation issues will improve over time. In the meantime, I recommended she try otc generic senakot S, 1-2 tabs qd.  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  Prior to this, she had been getting a B12 injection q 6 mo.  Will check vit B12 level today, then give 1000 mcg vit B12 IM today before she leaves the office. Will also check Vit D level. Will wait to check iron levels at next f/u visit in 2 mo.  3) Hypoglycemia: this is  resolved.  Continue small, frequent meals that are low in simple carbs and high in protein, complex carbs, and fiber.  4) Insomnia: doing well at the current time.  No changes to med regimen of 25mg  amitriptyline + 10mg  amitriptyline qhs.  An After Visit Summary was printed and given to the patient.  FOLLOW UP: Return in about 2 months (around 07/03/2016) for routine chronic illness f/u.  Signed:  Santiago Bumpers, MD           05/02/2016

## 2016-05-02 NOTE — Progress Notes (Signed)
Pre visit review using our clinic review tool, if applicable. No additional management support is needed unless otherwise documented below in the visit note. 

## 2016-05-02 NOTE — Patient Instructions (Signed)
Take generic otc senakot S: 1-2 tabs once daily for constipation.

## 2016-05-03 ENCOUNTER — Encounter: Payer: Self-pay | Admitting: Family Medicine

## 2016-05-03 LAB — VITAMIN B12: VITAMIN B 12: 513 pg/mL (ref 211–911)

## 2016-05-03 LAB — VITAMIN D 25 HYDROXY (VIT D DEFICIENCY, FRACTURES): VITD: 37.3 ng/mL (ref 30.00–100.00)

## 2016-05-03 MED ORDER — TRIAMCINOLONE ACETONIDE 0.1 % EX CREA: 1.0000 "application " | TOPICAL_CREAM | Freq: Two times a day (BID) | CUTANEOUS | 2 refills | Status: AC

## 2016-05-03 MED FILL — TRIAMCINOLONE 0.1% CREAM: 0.1 | 10 days supply | Qty: 30 | Fill #0

## 2016-05-03 NOTE — Telephone Encounter (Signed)
Please advise. Thanks.  

## 2016-05-03 NOTE — Telephone Encounter (Signed)
Triamcinolone cream eRx'd as per pt request

## 2016-05-08 ENCOUNTER — Ambulatory Visit: Payer: 59 | Admitting: Neurology

## 2016-05-09 DIAGNOSIS — Z79891 Long term (current) use of opiate analgesic: Secondary | ICD-10-CM | POA: Diagnosis not present

## 2016-05-09 DIAGNOSIS — M791 Myalgia: Secondary | ICD-10-CM | POA: Diagnosis not present

## 2016-05-09 DIAGNOSIS — M542 Cervicalgia: Secondary | ICD-10-CM | POA: Diagnosis not present

## 2016-05-09 DIAGNOSIS — G894 Chronic pain syndrome: Secondary | ICD-10-CM | POA: Diagnosis not present

## 2016-05-13 ENCOUNTER — Ambulatory Visit: Payer: 59 | Admitting: Neurology

## 2016-05-14 ENCOUNTER — Encounter: Payer: Self-pay | Admitting: Family Medicine

## 2016-05-21 ENCOUNTER — Encounter: Payer: Self-pay | Admitting: Family Medicine

## 2016-05-21 NOTE — Telephone Encounter (Signed)
Patient requesting Xanax RF but I don't see medication on her Med list.  Please advise.

## 2016-05-24 ENCOUNTER — Ambulatory Visit: Payer: 59 | Admitting: Physician Assistant

## 2016-05-24 ENCOUNTER — Other Ambulatory Visit: Payer: Self-pay | Admitting: Physician Assistant

## 2016-05-24 ENCOUNTER — Other Ambulatory Visit: Payer: Self-pay | Admitting: Family Medicine

## 2016-05-24 MED ORDER — ALPRAZOLAM 0.5 MG PO TABS: ORAL_TABLET | ORAL | 1 refills | Status: DC

## 2016-05-24 MED FILL — ALPRAZolam 0.5 MG TABS: 0.5 | 15 days supply | Qty: 60 | Fill #0

## 2016-05-24 NOTE — Telephone Encounter (Signed)
Will defer further refills of patient's medications to PCP  

## 2016-05-24 NOTE — Telephone Encounter (Signed)
As of 05/09/16 [facility] note, she signed a pain contract with them and she needs to request any pain med rx's from their office. I will rx low dose xanax and we'll see how she does.-thx

## 2016-05-27 MED FILL — AMITRIPTYLINE HCL 25 MG TAB: 25 | 30 days supply | Qty: 30 | Fill #2

## 2016-05-27 MED FILL — DULoxetine HCL 60 MG CPEP: 60 | 30 days supply | Qty: 30 | Fill #1

## 2016-05-27 MED FILL — AMITRIPTYLINE HCL 10 MG TAB: 10 | 30 days supply | Qty: 30 | Fill #3

## 2016-06-13 MED FILL — ALPRAZolam 0.5 MG TABS: 0.5 | 15 days supply | Qty: 60 | Fill #1

## 2016-06-13 MED FILL — OMEPRAZOLE DR 20 MG CAPSULE: 20 | 30 days supply | Qty: 60 | Fill #1

## 2016-06-25 ENCOUNTER — Encounter: Payer: Self-pay | Admitting: Family Medicine

## 2016-06-26 ENCOUNTER — Telehealth: Payer: Self-pay | Admitting: Family Medicine

## 2016-06-26 ENCOUNTER — Encounter: Payer: Self-pay | Admitting: Family Medicine

## 2016-06-26 MED ORDER — AMITRIPTYLINE HCL 10 MG PO TABS: 10.0000 mg | ORAL_TABLET | Freq: Every day | ORAL | 11 refills | Status: DC

## 2016-06-26 MED FILL — AMITRIPTYLINE HCL 10 MG TAB: 10 | 30 days supply | Qty: 30 | Fill #0

## 2016-06-26 MED FILL — AMITRIPTYLINE HCL 25 MG TAB: 25 | 30 days supply | Qty: 30 | Fill #3

## 2016-06-26 MED FILL — DULoxetine HCL 60 MG CPEP: 60 | 30 days supply | Qty: 30 | Fill #2

## 2016-06-26 NOTE — Telephone Encounter (Signed)
Patient sent an email stating she wanted her amitriptyline 10mg  refilled.  She has two different strengths in her med list.  Please advise.

## 2016-06-26 NOTE — Telephone Encounter (Signed)
This RF has been done.

## 2016-06-26 NOTE — Telephone Encounter (Signed)
She takes both a 25mg  amitriptyline and a 10mg  amitriptyline at bedtime. OK to RF amitriptyline 10mg , 1 tab po qhs, #30, RF x 12.-thx

## 2016-06-26 NOTE — Telephone Encounter (Signed)
Amitriptyline  refilled

## 2016-06-30 ENCOUNTER — Encounter: Payer: Self-pay | Admitting: Family Medicine

## 2016-07-03 ENCOUNTER — Ambulatory Visit: Payer: 59 | Admitting: Family Medicine

## 2016-07-04 MED FILL — MONO-LINYAH 28 TABLET: 0.25-35 | 84 days supply | Qty: 84 | Fill #1

## 2016-07-10 ENCOUNTER — Ambulatory Visit: Payer: 59 | Admitting: Family Medicine

## 2016-07-15 ENCOUNTER — Encounter: Payer: Self-pay | Admitting: Family Medicine

## 2016-07-15 ENCOUNTER — Ambulatory Visit (INDEPENDENT_AMBULATORY_CARE_PROVIDER_SITE_OTHER): Payer: 59 | Admitting: Family Medicine

## 2016-07-15 VITALS — BP 136/93 | HR 79 | Temp 98.6°F | Resp 16 | Ht 65.0 in | Wt 259.0 lb

## 2016-07-15 DIAGNOSIS — K909 Intestinal malabsorption, unspecified: Secondary | ICD-10-CM

## 2016-07-15 DIAGNOSIS — Z8639 Personal history of other endocrine, nutritional and metabolic disease: Secondary | ICD-10-CM | POA: Diagnosis not present

## 2016-07-15 NOTE — Progress Notes (Signed)
Pre visit review using our clinic review tool, if applicable. No additional management support is needed unless otherwise documented below in the visit note. 

## 2016-07-15 NOTE — Progress Notes (Signed)
OFFICE VISIT  07/15/2016   CC:  Chief Complaint  Patient presents with  . Follow-up  . discuss b12     HPI:    Patient is a 40 y.o. female who presents for 2 mo f/u anxiety/stress/depression + insomnia. 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  She takes a daily vit D, iron, and vit B12 supplement as noted in meds section. Doing well on duloxetine 60mg  qd.  Sleeping well as long as taking amitriptyline 35mg  qhs. Feeling fine, no acute complaints except some recent allergic rhinitis symptoms.  LMP: Sept 27, came when she expected it to.  She is on OCPs for hx of DUB--the OCPs have helped this normalize.  She has hx of BTL.  ROS: no fevers, no abd pains, no melena, no dizziness or syncope   Past Medical History:  Diagnosis Date  . Allergy   . Anxiety and depression    started with PPD  . Bacterial vaginitis   . Chronic neck pain    Myofascial pain syndrome per [facility], oxycodone per their office pain med contract.  (Normal cervical MRI 2010 per ortho notes).  . Dietary iron deficiency without anemia   . Easy bruisability 2011   Hx of: saw Hematologist (Dr. Cyndie Chime) and w/u neg for von Willebrand desease.  Marland Kitchen GERD (gastroesophageal reflux disease)    hx gastric ulcer and upper GI bleed, ? NSAID induced  . History of stomach ulcers   . Hypertension   . Migraine   . Seizure (HCC) 2015   s/p eval with neuro, no medication, no events since  . Syncope    with ? seizure in 2015, eval with neruo  . Vitamin B12 deficiency   . Vitamin D deficiency     Past Surgical History:  Procedure Laterality Date  . CESAREAN SECTION  2006  . ENDOMETRIAL BIOPSY    . EYE SURGERY Bilateral    2001  . GASTRIC BYPASS  2002  . TONSILLECTOMY AND ADENOIDECTOMY    . tubes in ears both Bilateral   . tubuligation  2006    Outpatient Medications Prior to Visit  Medication Sig Dispense Refill  . acetaminophen (TYLENOL) 500 MG tablet Take 1,000 mg by mouth  every 6 (six) hours as needed for mild pain, moderate pain or headache.    Marland Kitchen amitriptyline (ELAVIL) 10 MG tablet Take 1 tablet (10 mg total) by mouth at bedtime. 30 tablet 11  . amitriptyline (ELAVIL) 25 MG tablet Take 1 tablet (25 mg total) by mouth at bedtime. 30 tablet 5  . BIOTIN PO Take 1 tablet by mouth daily.     . Calcium 250 MG CAPS Take 1 capsule by mouth daily.     . Cholecalciferol (VITAMIN D) 2000 UNITS CAPS Take 1 capsule by mouth daily.     Marland Kitchen CRANBERRY EXTRACT PO Take 1 capsule by mouth daily as needed (for symptoms).    . DULoxetine (CYMBALTA) 60 MG capsule Take 1 capsule (60 mg total) by mouth daily. 30 capsule 3  . FeFum-FePoly-FA-B Cmp-C-Biot (INTEGRA PLUS) CAPS 1 PO daily for anemia 30 capsule 3  . MONONESSA 0.25-35 MG-MCG tablet Take 1 tablet by mouth once daily  4  . omeprazole (PRILOSEC) 20 MG capsule TAKE 1 CAPSULE BY MOUTH 2 TIMES DAILY BEFORE A MEAL 60 capsule 11  . oxyCODONE-acetaminophen (PERCOCET) 10-325 MG per tablet Take 1 tablet by mouth every 8 (eight) hours as needed for pain. Take 1 tablet by mouth every  8 hours as needed for severe pain. 15 tablet 0  . triamcinolone cream (KENALOG) 0.1 % Apply 1 application topically 2 (two) times daily. 30 g 2  . vitamin B-12 (CYANOCOBALAMIN) 1000 MCG tablet Take 1,000 mcg by mouth daily.    Marland Kitchen. ALPRAZolam (XANAX) 0.5 MG tablet 1-2 tabs po bid prn anxiety (Patient not taking: Reported on 07/15/2016) 60 tablet 1   No facility-administered medications prior to visit.     Allergies  Allergen Reactions  . Ibuprofen     Causes ulcers    ROS As per HPI  PE: Blood pressure (!) 136/93, pulse 79, temperature 98.6 F (37 C), temperature source Oral, resp. rate 16, height 5\' 5"  (1.651 m), weight 259 lb (117.5 kg), SpO2 100 %. Gen: Alert, well appearing.  Patient is oriented to person, place, time, and situation. RUE:AVWUENT:Eyes: no injection, icteris, swelling, or exudate.  EOMI, PERRLA. Mouth: lips without lesion/swelling.  Oral  mucosa pink and moist. Oropharynx without erythema, exudate, or swelling.  CV: RRR, no m/r/g.   LUNGS: CTA bilat, nonlabored resps, good aeration in all lung fields.  LABS:  Lab Results  Component Value Date   WBC 8.3 04/11/2016   HGB 13.7 04/11/2016   HCT 41.2 04/11/2016   MCV 86.2 04/11/2016   PLT 337 04/11/2016    Lab Results  Component Value Date   VITAMINB12 513 05/02/2016   Lab Results  Component Value Date   IRON 137 04/01/2016   IRON 133 04/01/2016   TIBC 427 04/01/2016   FERRITIN 44.8 04/01/2016   Vit D 37.3 on 05/02/16  IMPRESSION AND PLAN:  1) Anxiety and depression: continues to do well on duloxetine 60mg  qd.  2) Insomnia: continues to do well on amitriptyline 25mg  + 10mg  qhs.  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  An After Visit Summary was printed and given to the patient.  FOLLOW UP: Return in about 4 months (around 11/15/2016) for routine chronic illness f/u.  Signed:  Santiago BumpersPhil Kirstie Larsen, MD           07/15/2016

## 2016-07-18 LAB — CBC WITH DIFFERENTIAL/PLATELET
BASOS ABS: 0 10*3/uL (ref 0.0–0.1)
Basophils Relative: 0.4 % (ref 0.0–3.0)
EOS PCT: 0.4 % (ref 0.0–5.0)
Eosinophils Absolute: 0 10*3/uL (ref 0.0–0.7)
HCT: 41.2 % (ref 36.0–46.0)
Hemoglobin: 14 g/dL (ref 12.0–15.0)
LYMPHS ABS: 3 10*3/uL (ref 0.7–4.0)
Lymphocytes Relative: 29.2 % (ref 12.0–46.0)
MCHC: 33.9 g/dL (ref 30.0–36.0)
MCV: 86.5 fl (ref 78.0–100.0)
MONO ABS: 0.6 10*3/uL (ref 0.1–1.0)
Monocytes Relative: 6.3 % (ref 3.0–12.0)
NEUTROS ABS: 6.5 10*3/uL (ref 1.4–7.7)
NEUTROS PCT: 63.7 % (ref 43.0–77.0)
PLATELETS: 319 10*3/uL (ref 150.0–400.0)
RBC: 4.77 Mil/uL (ref 3.87–5.11)
RDW: 13.6 % (ref 11.5–15.5)
WBC: 10.2 10*3/uL (ref 4.0–10.5)

## 2016-07-18 LAB — IRON AND TIBC
%SAT: 22 % (ref 11–50)
Iron: 101 ug/dL (ref 40–190)
TIBC: 452 ug/dL — ABNORMAL HIGH (ref 250–450)
UIBC: 351 ug/dL (ref 125–400)

## 2016-07-18 LAB — VITAMIN B12: Vitamin B-12: 607 pg/mL (ref 211–911)

## 2016-07-18 LAB — FERRITIN: FERRITIN: 8.5 ng/mL — AB (ref 10.0–291.0)

## 2016-07-18 LAB — IRON: Iron: 97 ug/dL (ref 42–145)

## 2016-07-18 LAB — VITAMIN D 25 HYDROXY (VIT D DEFICIENCY, FRACTURES): VITD: 40.34 ng/mL (ref 30.00–100.00)

## 2016-07-24 MED FILL — OMEPRAZOLE DR 20 MG CAPSULE: 20 | 30 days supply | Qty: 60 | Fill #2

## 2016-07-24 MED FILL — DULoxetine HCL 60 MG CPEP: 60 | 30 days supply | Qty: 30 | Fill #3

## 2016-07-24 MED FILL — AMITRIPTYLINE HCL 25 MG TAB: 25 | 30 days supply | Qty: 30 | Fill #4

## 2016-07-24 MED FILL — AMITRIPTYLINE HCL 10 MG TAB: 10 | 30 days supply | Qty: 30 | Fill #1

## 2016-07-25 ENCOUNTER — Encounter: Payer: Self-pay | Admitting: Family Medicine

## 2016-07-30 ENCOUNTER — Encounter: Payer: Self-pay | Admitting: Family Medicine

## 2016-07-30 MED ORDER — ELETRIPTAN HYDROBROMIDE 20 MG PO TABS: ORAL_TABLET | ORAL | 6 refills | Status: DC

## 2016-07-30 MED FILL — ELETRIPTAN HBR 20 MG TABLET: 20 | 30 days supply | Qty: 9 | Fill #0

## 2016-07-30 NOTE — Telephone Encounter (Signed)
Relpax generic called in.

## 2016-08-06 ENCOUNTER — Encounter: Payer: Self-pay | Admitting: Family Medicine

## 2016-08-06 MED ORDER — FLUTICASONE PROPIONATE 50 MCG/ACT NA SUSP: 2.0000 | Freq: Every day | NASAL | 12 refills | Status: DC

## 2016-08-06 MED ORDER — MONTELUKAST SODIUM 10 MG PO TABS: 10.0000 mg | ORAL_TABLET | Freq: Every day | ORAL | 6 refills | Status: DC

## 2016-08-06 NOTE — Telephone Encounter (Signed)
Lets try singulair 10mg  once daily. I sent this rx in and I sent in RFs of her flonase.

## 2016-08-07 MED FILL — FLUTICASONE PROP 50 MCG SPR: 50 | 30 days supply | Qty: 16 | Fill #0

## 2016-08-07 MED FILL — MONTELUKAST SOD 10 MG TAB: 10 | 30 days supply | Qty: 30 | Fill #0

## 2016-08-09 DIAGNOSIS — M791 Myalgia: Secondary | ICD-10-CM | POA: Diagnosis not present

## 2016-08-09 DIAGNOSIS — Z79891 Long term (current) use of opiate analgesic: Secondary | ICD-10-CM | POA: Diagnosis not present

## 2016-08-09 DIAGNOSIS — G894 Chronic pain syndrome: Secondary | ICD-10-CM | POA: Diagnosis not present

## 2016-08-09 DIAGNOSIS — M542 Cervicalgia: Secondary | ICD-10-CM | POA: Diagnosis not present

## 2016-08-13 ENCOUNTER — Encounter: Payer: Self-pay | Admitting: Family Medicine

## 2016-08-13 MED ORDER — ALPRAZOLAM 0.5 MG PO TABS: ORAL_TABLET | ORAL | 5 refills | Status: DC

## 2016-08-13 MED FILL — ALPRAZolam 0.5 MG TABS: 0.5 | 15 days supply | Qty: 60 | Fill #0

## 2016-08-13 NOTE — Telephone Encounter (Signed)
Alprazolam rx printed. 

## 2016-09-02 ENCOUNTER — Encounter: Payer: Self-pay | Admitting: Family Medicine

## 2016-09-03 ENCOUNTER — Other Ambulatory Visit: Payer: Self-pay | Admitting: Family Medicine

## 2016-09-03 MED ORDER — DULOXETINE HCL 60 MG PO CPEP: 60.0000 mg | ORAL_CAPSULE | Freq: Every day | ORAL | 6 refills | Status: DC

## 2016-09-03 MED FILL — AMITRIPTYLINE HCL 10 MG TAB: 10 | 30 days supply | Qty: 30 | Fill #2

## 2016-09-03 MED FILL — AMITRIPTYLINE HCL 25 MG TAB: 25 | 30 days supply | Qty: 30 | Fill #5

## 2016-09-03 MED FILL — MONTELUKAST SOD 10 MG TAB: 10 | 30 days supply | Qty: 30 | Fill #1

## 2016-09-03 MED FILL — DULoxetine HCL 60 MG CPEP: 60 | 30 days supply | Qty: 30 | Fill #0

## 2016-09-03 MED FILL — OMEPRAZOLE DR 20 MG CAPSULE: 20 | 30 days supply | Qty: 60 | Fill #3

## 2016-09-03 NOTE — Telephone Encounter (Signed)
RF request for cymbalta LOV: 05/02/16 Next ov: 11/01/16 Last written: 04/11/16 #30 w/ 3RF

## 2016-09-11 MED FILL — ALPRAZolam 0.5 MG TABS: 0.5 | 15 days supply | Qty: 60 | Fill #1

## 2016-09-23 MED FILL — MONO-LINYAH 28 TABLET: 0.25-35 | 84 days supply | Qty: 84 | Fill #2

## 2016-09-27 ENCOUNTER — Encounter: Payer: Self-pay | Admitting: Family Medicine

## 2016-09-27 ENCOUNTER — Other Ambulatory Visit: Payer: Self-pay | Admitting: Physician Assistant

## 2016-09-27 DIAGNOSIS — F39 Unspecified mood [affective] disorder: Secondary | ICD-10-CM

## 2016-09-27 MED FILL — AMITRIPTYLINE HCL 25 MG TAB: 25 | 30 days supply | Qty: 30 | Fill #0

## 2016-09-27 MED FILL — AMITRIPTYLINE HCL 10 MG TAB: 10 | 30 days supply | Qty: 30 | Fill #3

## 2016-09-27 MED FILL — DULoxetine HCL 60 MG CPEP: 60 | 30 days supply | Qty: 30 | Fill #1

## 2016-09-27 MED FILL — OMEPRAZOLE DR 20 MG CAPSULE: 20 | 30 days supply | Qty: 60 | Fill #4

## 2016-09-27 MED FILL — MONTELUKAST SOD 10 MG TAB: 10 | 30 days supply | Qty: 30 | Fill #2

## 2016-09-27 NOTE — Telephone Encounter (Signed)
Patient transferred Care to Dr. Milinda CaveMcGowen.   Note routed to him for refill.

## 2016-10-07 ENCOUNTER — Telehealth: Payer: Self-pay | Admitting: Family Medicine

## 2016-10-07 NOTE — Telephone Encounter (Signed)
I received FMLA paperwork to fill out for this patient. Please call her and ask her what is the medical condition for which she is seeking medical leave from work?  --thx

## 2016-10-08 ENCOUNTER — Encounter: Payer: Self-pay | Admitting: *Deleted

## 2016-10-08 NOTE — Telephone Encounter (Signed)
My chart message sent to pt.

## 2016-10-08 NOTE — Telephone Encounter (Signed)
Please advise. Thanks.  

## 2016-10-13 ENCOUNTER — Encounter: Payer: Self-pay | Admitting: Family Medicine

## 2016-10-13 DIAGNOSIS — Z7689 Persons encountering health services in other specified circumstances: Secondary | ICD-10-CM

## 2016-10-17 MED FILL — FLUTICASONE PROP 50 MCG SPR: 50 | 30 days supply | Qty: 16 | Fill #1

## 2016-10-30 ENCOUNTER — Encounter: Payer: 59 | Admitting: Family Medicine

## 2016-11-01 ENCOUNTER — Encounter: Payer: Self-pay | Admitting: Family Medicine

## 2016-11-01 ENCOUNTER — Ambulatory Visit (INDEPENDENT_AMBULATORY_CARE_PROVIDER_SITE_OTHER): Payer: 59 | Admitting: Family Medicine

## 2016-11-01 VITALS — BP 122/78 | HR 75 | Temp 98.4°F | Resp 16 | Ht 65.0 in | Wt 257.0 lb

## 2016-11-01 DIAGNOSIS — Z79891 Long term (current) use of opiate analgesic: Secondary | ICD-10-CM | POA: Diagnosis not present

## 2016-11-01 DIAGNOSIS — I959 Hypotension, unspecified: Secondary | ICD-10-CM

## 2016-11-01 DIAGNOSIS — G894 Chronic pain syndrome: Secondary | ICD-10-CM | POA: Diagnosis not present

## 2016-11-01 DIAGNOSIS — E559 Vitamin D deficiency, unspecified: Secondary | ICD-10-CM

## 2016-11-01 DIAGNOSIS — Z0000 Encounter for general adult medical examination without abnormal findings: Secondary | ICD-10-CM

## 2016-11-01 DIAGNOSIS — H52223 Regular astigmatism, bilateral: Secondary | ICD-10-CM | POA: Diagnosis not present

## 2016-11-01 DIAGNOSIS — E162 Hypoglycemia, unspecified: Secondary | ICD-10-CM

## 2016-11-01 DIAGNOSIS — H5213 Myopia, bilateral: Secondary | ICD-10-CM | POA: Diagnosis not present

## 2016-11-01 DIAGNOSIS — K909 Intestinal malabsorption, unspecified: Secondary | ICD-10-CM

## 2016-11-01 DIAGNOSIS — M542 Cervicalgia: Secondary | ICD-10-CM | POA: Diagnosis not present

## 2016-11-01 LAB — CBC WITH DIFFERENTIAL/PLATELET
Basophils Absolute: 0 10*3/uL (ref 0.0–0.1)
Basophils Relative: 0.5 % (ref 0.0–3.0)
EOS ABS: 0 10*3/uL (ref 0.0–0.7)
Eosinophils Relative: 0.5 % (ref 0.0–5.0)
HCT: 42.1 % (ref 36.0–46.0)
HEMOGLOBIN: 14.2 g/dL (ref 12.0–15.0)
LYMPHS ABS: 2.6 10*3/uL (ref 0.7–4.0)
Lymphocytes Relative: 46 % (ref 12.0–46.0)
MCHC: 33.7 g/dL (ref 30.0–36.0)
MCV: 89.1 fl (ref 78.0–100.0)
MONO ABS: 0.3 10*3/uL (ref 0.1–1.0)
Monocytes Relative: 5.7 % (ref 3.0–12.0)
NEUTROS PCT: 47.3 % (ref 43.0–77.0)
Neutro Abs: 2.7 10*3/uL (ref 1.4–7.7)
PLATELETS: 271 10*3/uL (ref 150.0–400.0)
RBC: 4.73 Mil/uL (ref 3.87–5.11)
RDW: 13.8 % (ref 11.5–15.5)
WBC: 5.8 10*3/uL (ref 4.0–10.5)

## 2016-11-01 LAB — LIPID PANEL
CHOLESTEROL: 236 mg/dL — AB (ref 0–200)
HDL: 76.9 mg/dL (ref >39.00)
LDL Cholesterol: 126 mg/dL — ABNORMAL HIGH (ref 0–99)
NONHDL: 159.07
Total CHOL/HDL Ratio: 3
Triglycerides: 166 mg/dL — ABNORMAL HIGH (ref 0.0–149.0)
VLDL: 33.2 mg/dL (ref 0.0–40.0)

## 2016-11-01 LAB — COMPREHENSIVE METABOLIC PANEL
ALBUMIN: 3.8 g/dL (ref 3.5–5.2)
ALK PHOS: 70 U/L (ref 39–117)
ALT: 9 U/L (ref 0–35)
AST: 12 U/L (ref 0–37)
BILIRUBIN TOTAL: 0.5 mg/dL (ref 0.2–1.2)
BUN: 8 mg/dL (ref 6–23)
CO2: 30 mEq/L (ref 19–32)
CREATININE: 0.75 mg/dL (ref 0.40–1.20)
Calcium: 9.2 mg/dL (ref 8.4–10.5)
Chloride: 103 mEq/L (ref 96–112)
GFR: 109.5 mL/min (ref >60.00)
GLUCOSE: 77 mg/dL (ref 70–99)
Potassium: 4.4 mEq/L (ref 3.5–5.1)
SODIUM: 139 meq/L (ref 135–145)
TOTAL PROTEIN: 6.6 g/dL (ref 6.0–8.3)

## 2016-11-01 LAB — TSH: TSH: 1.56 u[IU]/mL (ref 0.35–4.50)

## 2016-11-01 LAB — FERRITIN: Ferritin: 12.5 ng/mL (ref 10.0–291.0)

## 2016-11-01 LAB — CORTISOL: Cortisol, Plasma: 6.5 ug/dL

## 2016-11-01 LAB — VITAMIN D 25 HYDROXY (VIT D DEFICIENCY, FRACTURES): VITD: 28.02 ng/mL — AB (ref 30.00–100.00)

## 2016-11-01 LAB — VITAMIN B12: Vitamin B-12: 621 pg/mL (ref 211–911)

## 2016-11-01 LAB — IRON: Iron: 135 ug/dL (ref 42–145)

## 2016-11-01 MED FILL — MONTELUKAST SOD 10 MG TAB: 10 | 30 days supply | Qty: 30 | Fill #3

## 2016-11-01 MED FILL — AMITRIPTYLINE HCL 25 MG TAB: 25 | 30 days supply | Qty: 30 | Fill #1

## 2016-11-01 MED FILL — AMITRIPTYLINE HCL 10 MG TAB: 10 | 30 days supply | Qty: 30 | Fill #4

## 2016-11-01 MED FILL — OMEPRAZOLE DR 20 MG CAPSULE: 20 | 30 days supply | Qty: 60 | Fill #5

## 2016-11-01 MED FILL — DULoxetine HCL 60 MG CPEP: 60 | 30 days supply | Qty: 30 | Fill #2

## 2016-11-01 NOTE — Progress Notes (Signed)
Office Note 11/01/2016  CC:  Chief Complaint  Patient presents with  . Annual Exam    Pt is fasting.    HPI:  Toni [name] is a 41 y.o.  female who is here for annual health maintenance exam. Home bp monitoring 120/80 typically.  She feels well today.  She has had no more hypoglycemia since I did FMLA paperwork for her a few weeks ago.  She feels like the alprazolam she was on was causing her low sugar.  She weened herself off of this med.  She eats about 6 small meals throughout the day.  She sees GYN for f/u later today.  Eye exam: last year.  Scheduled for one later today. Dental preventative exams q248mo.    Past Medical History:  Diagnosis Date  . Allergy   . 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 Anxiety and depression    started with PPD  . Bacterial vaginitis   . Chronic neck pain    Myofascial pain syndrome per [facility], oxycodone per their office pain med contract.  (Normal cervical MRI 2010 per ortho notes).  . Dietary iron deficiency without anemia   . Easy bruisability 2011   Hx of: saw Hematologist (Dr. Cyndie ChimeGranfortuna) and w/u neg for von Willebrand desease.  Marland Kitchen. GERD (gastroesophageal reflux disease)    hx gastric ulcer and upper GI bleed, ? NSAID induced  . History of stomach ulcers   . Hypertension   . Migraine   . Seizure (HCC) 2015   s/p eval with neuro, no medication, no events since  . Syncope    with ? seizure in 2015, eval with neruo  . Vitamin B12 deficiency   . Vitamin D deficiency     Past Surgical History:  Procedure Laterality Date  . CESAREAN SECTION  2006  . ENDOMETRIAL BIOPSY    . EYE SURGERY Bilateral    2001  . GASTRIC BYPASS  2002  . TONSILLECTOMY AND ADENOIDECTOMY    . tubes in ears both Bilateral   . tubuligation  2006    Family History  Problem Relation Age of Onset  . Arthritis Mother   . Hyperlipidemia Mother   . Heart disease Mother   . Diabetes Mother   . Asthma Mother   . Aneurysm Father    deceased  . Lung cancer Maternal Aunt     smoker  . Colon cancer Maternal Grandmother   . Lung cancer Maternal Aunt     smoker    Social History   Social History  . Marital status: Married    Spouse name: N/A  . Number of children: N/A  . Years of education: N/A   Occupational History  . Not on file.   Social History Main Topics  . Smoking status: Never Smoker  . Smokeless tobacco: Never Used  . Alcohol use 0.0 oz/week     Comment: occasional   . Drug use: No  . Sexual activity: Not on file   Other Topics Concern  . Not on file   Social History Narrative   Married.   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   No T/A/Ds.    Outpatient Medications Prior to Visit  Medication Sig Dispense Refill  . acetaminophen (TYLENOL) 500 MG tablet Take 1,000 mg by mouth every 6 (six) hours as needed for mild pain, moderate pain or headache.    Marland Kitchen. amitriptyline (ELAVIL) 10 MG tablet Take 1 tablet (10 mg total) by mouth at bedtime. 30  tablet 11  . amitriptyline (ELAVIL) 25 MG tablet TAKE 1 TABLET BY MOUTH AT BEDTIME. 30 tablet 11  . BIOTIN PO Take 1 tablet by mouth daily.     . Calcium 250 MG CAPS Take 1 capsule by mouth daily.     . Cholecalciferol (VITAMIN D) 2000 UNITS CAPS Take 1 capsule by mouth daily.     Marland Kitchen CRANBERRY EXTRACT PO Take 1 capsule by mouth daily as needed (for symptoms).    . DULoxetine (CYMBALTA) 60 MG capsule Take 1 capsule (60 mg total) by mouth daily. 30 capsule 6  . eletriptan (RELPAX) 20 MG tablet 1-2 tabs po at onset of migraine HA.  May repeat in 2 hours if headache persists or recurs.   Max dosing in 24 hours is 80 mg. 10 tablet 6  . fluticasone (FLONASE) 50 MCG/ACT nasal spray Place 2 sprays into both nostrils daily. 16 g 12  . MONONESSA 0.25-35 MG-MCG tablet Take 1 tablet by mouth once daily  4  . montelukast (SINGULAIR) 10 MG tablet Take 1 tablet (10 mg total) by mouth at bedtime. 30 tablet 6  . omeprazole (PRILOSEC) 20 MG capsule TAKE 1 CAPSULE BY MOUTH  2 TIMES DAILY BEFORE A MEAL 60 capsule 11  . oxyCODONE-acetaminophen (PERCOCET) 10-325 MG per tablet Take 1 tablet by mouth every 8 (eight) hours as needed for pain. Take 1 tablet by mouth every 8 hours as needed for severe pain. 15 tablet 0  . triamcinolone cream (KENALOG) 0.1 % Apply 1 application topically 2 (two) times daily. 30 g 2  . vitamin B-12 (CYANOCOBALAMIN) 1000 MCG tablet Take 1,000 mcg by mouth daily.    Marland Kitchen ALPRAZolam (XANAX) 0.5 MG tablet 1-2 tabs po bid prn anxiety (Patient not taking: Reported on 11/01/2016) 60 tablet 5  . FeFum-FePoly-FA-B Cmp-C-Biot (INTEGRA PLUS) CAPS 1 PO daily for anemia (Patient not taking: Reported on 11/01/2016) 30 capsule 3   No facility-administered medications prior to visit.     Allergies  Allergen Reactions  . Ibuprofen     Causes ulcers    ROS Review of Systems  Constitutional: Negative for appetite change, chills, fatigue and fever.  HENT: Negative for congestion, dental problem, ear pain and sore throat.   Eyes: Negative for discharge, redness and visual disturbance.  Respiratory: Negative for cough, chest tightness, shortness of breath and wheezing.   Cardiovascular: Negative for chest pain, palpitations and leg swelling.  Gastrointestinal: Negative for abdominal pain, blood in stool, diarrhea, nausea and vomiting.  Genitourinary: Negative for difficulty urinating, dysuria, flank pain, frequency, hematuria and urgency.  Musculoskeletal: Negative for arthralgias, back pain, joint swelling, myalgias and neck stiffness.  Skin: Negative for pallor and rash.  Neurological: Negative for dizziness, speech difficulty, weakness and headaches.  Hematological: Negative for adenopathy. Does not bruise/bleed easily.  Psychiatric/Behavioral: Negative for confusion and sleep disturbance. The patient is not nervous/anxious.     PE; Blood pressure 122/78, pulse 75, temperature 98.4 F (36.9 C), temperature source Oral, resp. rate 16, height 5\' 5"   (1.651 m), weight 257 lb (116.6 kg), last menstrual period 10/22/2016, SpO2 100 %. Gen: Alert, well appearing.  Patient is oriented to person, place, time, and situation. AFFECT: pleasant, lucid thought and speech. ENT: Ears: EACs clear, normal epithelium.  TMs with good light reflex and landmarks bilaterally.  Eyes: no injection, icteris, swelling, or exudate.  EOMI, PERRLA. Nose: no drainage or turbinate edema/swelling.  No injection or focal lesion.  Mouth: lips without lesion/swelling.  Oral mucosa pink and  moist.  Dentition intact and without obvious caries or gingival swelling.  Oropharynx without erythema, exudate, or swelling.  Neck: supple/nontender.  No LAD, mass, or TM.  Carotid pulses 2+ bilaterally, without bruits. CV: RRR, no m/r/g.   LUNGS: CTA bilat, nonlabored resps, good aeration in all lung fields. ABD: soft, NT, ND, BS normal.  No hepatospenomegaly or mass.  No bruits. EXT: no clubbing, cyanosis, or edema.  Musculoskeletal: no joint swelling, erythema, warmth, or tenderness.  ROM of all joints intact. Skin - no sores or suspicious lesions or rashes or color changes   Pertinent labs:  Lab Results  Component Value Date   TSH 1.596 03/21/2016   Lab Results  Component Value Date   WBC 10.2 07/15/2016   HGB 14.0 07/15/2016   HCT 41.2 07/15/2016   MCV 86.5 07/15/2016   PLT 319.0 07/15/2016   Lab Results  Component Value Date   CREATININE 0.84 04/01/2016   BUN 7 04/01/2016   NA 137 04/01/2016   K 4.1 04/01/2016   CL 104 04/01/2016   CO2 21 04/01/2016   Lab Results  Component Value Date   ALT 12 03/27/2016   AST 14 03/27/2016   ALKPHOS 58 03/27/2016   BILITOT 0.5 03/27/2016   Lab Results  Component Value Date   CHOL 185 11/24/2015   Lab Results  Component Value Date   HDL 64.30 11/24/2015   Lab Results  Component Value Date   LDLCALC 97 11/24/2015   Lab Results  Component Value Date   TRIG 119.0 11/24/2015   Lab Results  Component Value Date    CHOLHDL 3 11/24/2015   Lab Results  Component Value Date   HGBA1C 5.1 03/21/2016   Lab Results  Component Value Date   VITAMINB12 607 07/15/2016   Lab Results  Component Value Date   IRON 97 07/15/2016   IRON 101 07/15/2016   TIBC 452 (H) 07/15/2016   FERRITIN 8.5 (L) 07/15/2016   Vit D= 40 on 07/2016  ASSESSMENT AND PLAN:   1) Health maintenance exam: Reviewed age and gender appropriate health maintenance issues (prudent diet, regular exercise, health risks of tobacco and excessive alcohol, use of seatbelts, fire alarms in home, use of sunscreen).  Also reviewed age and gender appropriate health screening as well as vaccine recommendations. Vaccines UTD. Fasting HP labs drawn today. Cerv and Breast ca screening UTD--followed by GYN.  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  3) Hx of vit D def: recheck Vit D level today.  4) Episodic hypoglycemia: question related to xanax per pt report.  She has weened herself off of this med. I will check a fasting (morning) cortisol level today. I think this susceptibility to hypogycemia is probably related to her altered GI anatomy from gastric bypass surgery. She'll continue to eat 6 small meals throughout her day.  An After Visit Summary was printed and given to the patient.  FOLLOW UP:  Return in about 4 months (around 03/01/2017) for routine chronic illness f/u.  Signed:  Santiago Bumpers, MD           11/01/2016

## 2016-11-01 NOTE — Progress Notes (Signed)
Pre visit review using our clinic review tool, if applicable. No additional management support is needed unless otherwise documented below in the visit note. 

## 2016-11-02 LAB — IRON AND TIBC
%SAT: 35 % (ref 11–50)
Iron: 143 ug/dL (ref 40–190)
TIBC: 410 ug/dL (ref 250–450)
UIBC: 267 ug/dL (ref 125–400)

## 2016-11-04 ENCOUNTER — Encounter: Payer: Self-pay | Admitting: *Deleted

## 2016-11-05 ENCOUNTER — Other Ambulatory Visit: Payer: Self-pay | Admitting: Family Medicine

## 2016-11-05 MED ORDER — VITAMIN D (ERGOCALCIFEROL) 1.25 MG (50000 UNIT) PO CAPS: 50000.0000 [IU] | ORAL_CAPSULE | ORAL | 3 refills | Status: DC

## 2016-11-05 NOTE — Telephone Encounter (Signed)
OK. I sent in rx for high dose (50,000 U) vit D replacement for her to take.  She'll take this for 3 months. When she finshes this she should resume her 5000 Unit per day vitamin D dosing.-thx

## 2016-11-05 NOTE — Telephone Encounter (Signed)
Please advise. Thanks.  

## 2016-11-06 MED FILL — VIT D2 1.25 MG (50,000 UNIT: 1.25 MG | 28 days supply | Qty: 4 | Fill #0

## 2016-11-07 ENCOUNTER — Encounter: Payer: Self-pay | Admitting: Family Medicine

## 2016-12-02 MED FILL — VIT D2 1.25 MG (50,000 UNIT: 1.25 MG | 28 days supply | Qty: 4 | Fill #1

## 2016-12-09 MED FILL — DULoxetine HCL 60 MG CPEP: 60 | 30 days supply | Qty: 30 | Fill #3

## 2016-12-09 MED FILL — OMEPRAZOLE DR 20 MG CAPSULE: 20 | 30 days supply | Qty: 60 | Fill #6

## 2016-12-09 MED FILL — AMITRIPTYLINE HCL 25 MG TAB: 25 | 30 days supply | Qty: 30 | Fill #2

## 2016-12-09 MED FILL — AMITRIPTYLINE HCL 10 MG TAB: 10 | 30 days supply | Qty: 30 | Fill #5

## 2016-12-09 MED FILL — MONTELUKAST SOD 10 MG TAB: 10 | 30 days supply | Qty: 30 | Fill #4

## 2016-12-09 MED FILL — MONO-LINYAH 28 TABLET: 0.25-35 | 84 days supply | Qty: 84 | Fill #3

## 2016-12-12 MED FILL — ALPRAZolam 0.5 MG TABS: 0.5 | 15 days supply | Qty: 60 | Fill #2

## 2016-12-31 MED FILL — VIT D2 1.25 MG (50,000 UNIT: 1.25 MG | 28 days supply | Qty: 4 | Fill #2

## 2016-12-31 MED FILL — FLUTICASONE PROP 50 MCG SPR: 50 | 30 days supply | Qty: 16 | Fill #2

## 2017-01-08 ENCOUNTER — Encounter: Payer: Self-pay | Admitting: Family Medicine

## 2017-01-09 ENCOUNTER — Encounter: Payer: Self-pay | Admitting: Family Medicine

## 2017-01-09 MED FILL — OMEPRAZOLE DR 20 MG CAPSULE: 20 | 30 days supply | Qty: 60 | Fill #7

## 2017-01-09 MED FILL — DULoxetine HCL 60 MG CPEP: 60 | 30 days supply | Qty: 30 | Fill #4

## 2017-01-09 MED FILL — AMITRIPTYLINE HCL 10 MG TAB: 10 | 30 days supply | Qty: 30 | Fill #6

## 2017-01-09 MED FILL — AMITRIPTYLINE HCL 25 MG TAB: 25 | 30 days supply | Qty: 30 | Fill #3

## 2017-01-09 MED FILL — MONTELUKAST SOD 10 MG TAB: 10 | 30 days supply | Qty: 30 | Fill #5

## 2017-01-09 NOTE — Telephone Encounter (Signed)
I have never prescribed oxycodone for her. As per my chart notes, she has a pain contract with [facility] for her chronic neck pain, so I assume they have been prescribing the oxycodone she is talking about.  I do not rx oxycodone for any type of headaches, as this kind of med may actually cause increased severity and frequency of headaches, esp if taken too often.   I see that a specific migraine headache medicine is listed in her med list.  It is called relpax.  I am willing to rx this for her, but not any kind of narcotic pain medication.--thx

## 2017-01-09 NOTE — Telephone Encounter (Signed)
Patient advised.   She states that oxycodone is the only thing that works for these "allergy migraines"  I advised her that just contacting our office for a controlled substance is violating her controlled substance contract with Dr Ethelene Hal.  I advised her that this medication Rx'd by him was to be used for her ortho issues only.  She continued to stated that nothing else worked, Chiropodist, claritin, and relpax.   I told her that she may need another appointment to examine her since these migraines are so bad.  Patient didn't seem to want to hear any of my advice, but I told her I would send message back to DR to see if there is another medication option.  Please advise.

## 2017-01-09 NOTE — Telephone Encounter (Signed)
Patient requesting RF of oxycodone 10/325.   These have never been Rx'd by DR McGowen. Last RX printed 04/24/15 by Malva Cogan Last OV 11/01/16 Next OV 02/21/17   Please advise.

## 2017-01-09 NOTE — Telephone Encounter (Signed)
Yes, if patient wants to make an appointment that would be perfectly appropriate.-thx

## 2017-01-10 ENCOUNTER — Encounter: Payer: Self-pay | Admitting: Family Medicine

## 2017-01-10 MED FILL — ALPRAZolam 0.5 MG TABS: 0.5 | 15 days supply | Qty: 60 | Fill #3

## 2017-01-15 ENCOUNTER — Encounter: Payer: Self-pay | Admitting: Family Medicine

## 2017-01-15 NOTE — Progress Notes (Signed)
A user error has taken place: encounter opened in error, closed for administrative reasons.

## 2017-01-15 NOTE — Telephone Encounter (Signed)
Please advise. Thanks.  

## 2017-01-15 NOTE — Telephone Encounter (Signed)
Letter printed. Will you pls mail this as per pt's request?--thx

## 2017-01-17 ENCOUNTER — Encounter: Payer: Self-pay | Admitting: Family Medicine

## 2017-01-17 ENCOUNTER — Ambulatory Visit (INDEPENDENT_AMBULATORY_CARE_PROVIDER_SITE_OTHER): Payer: 59 | Admitting: Family Medicine

## 2017-01-17 ENCOUNTER — Ambulatory Visit: Payer: Self-pay | Admitting: Family Medicine

## 2017-01-17 ENCOUNTER — Encounter: Payer: Self-pay | Admitting: *Deleted

## 2017-01-17 VITALS — BP 139/91 | HR 73 | Temp 98.1°F | Resp 16 | Ht 65.0 in | Wt 255.5 lb

## 2017-01-17 DIAGNOSIS — J309 Allergic rhinitis, unspecified: Secondary | ICD-10-CM

## 2017-01-17 DIAGNOSIS — K9089 Other intestinal malabsorption: Secondary | ICD-10-CM | POA: Diagnosis not present

## 2017-01-17 DIAGNOSIS — E538 Deficiency of other specified B group vitamins: Secondary | ICD-10-CM

## 2017-01-17 DIAGNOSIS — R5383 Other fatigue: Secondary | ICD-10-CM | POA: Diagnosis not present

## 2017-01-17 DIAGNOSIS — R51 Headache: Secondary | ICD-10-CM | POA: Diagnosis not present

## 2017-01-17 DIAGNOSIS — R519 Headache, unspecified: Secondary | ICD-10-CM

## 2017-01-17 LAB — IRON AND TIBC
%SAT: 24 % (ref 11–50)
Iron: 100 ug/dL (ref 40–190)
TIBC: 417 ug/dL (ref 250–450)
UIBC: 317 ug/dL (ref 125–400)

## 2017-01-17 LAB — CBC WITH DIFFERENTIAL/PLATELET
BASOS PCT: 0.5 % (ref 0.0–3.0)
Basophils Absolute: 0 10*3/uL (ref 0.0–0.1)
EOS PCT: 0.5 % (ref 0.0–5.0)
Eosinophils Absolute: 0 10*3/uL (ref 0.0–0.7)
HCT: 44.7 % (ref 36.0–46.0)
Hemoglobin: 14.6 g/dL (ref 12.0–15.0)
LYMPHS ABS: 2.2 10*3/uL (ref 0.7–4.0)
Lymphocytes Relative: 46.1 % — ABNORMAL HIGH (ref 12.0–46.0)
MCHC: 32.6 g/dL (ref 30.0–36.0)
MCV: 92.4 fl (ref 78.0–100.0)
MONO ABS: 0.3 10*3/uL (ref 0.1–1.0)
Monocytes Relative: 6.1 % (ref 3.0–12.0)
NEUTROS ABS: 2.3 10*3/uL (ref 1.4–7.7)
NEUTROS PCT: 46.8 % (ref 43.0–77.0)
Platelets: 305 10*3/uL (ref 150.0–400.0)
RBC: 4.84 Mil/uL (ref 3.87–5.11)
RDW: 13.6 % (ref 11.5–15.5)
WBC: 4.8 10*3/uL (ref 4.0–10.5)

## 2017-01-17 LAB — FERRITIN: FERRITIN: 16.4 ng/mL (ref 10.0–291.0)

## 2017-01-17 LAB — IRON: Iron: 88 ug/dL (ref 42–145)

## 2017-01-17 LAB — VITAMIN B12: VITAMIN B 12: 640 pg/mL (ref 211–911)

## 2017-01-17 MED ORDER — PREDNISONE 20 MG PO TABS: ORAL_TABLET | ORAL | 0 refills | Status: DC

## 2017-01-17 MED ORDER — CYANOCOBALAMIN 1000 MCG/ML IJ SOLN
1000.0000 ug | Freq: Once | INTRAMUSCULAR | Status: AC
  Administered 2017-01-17: 1000 ug via INTRAMUSCULAR

## 2017-01-17 MED FILL — predniSONE 20 MG TABS: 20 | 19 days supply | Qty: 32 | Fill #0

## 2017-01-17 NOTE — Addendum Note (Signed)
Addended by: Regan Rakers on: 01/17/2017 08:55 AM   Modules accepted: Orders

## 2017-01-17 NOTE — Progress Notes (Signed)
OFFICE VISIT  01/17/2017   CC:  Chief Complaint  Patient presents with  . Headache    x 2 weeks  . Follow-up    blood clotting disorder   HPI:    Patient is a 41 y.o.  female who presents for fatigue and headaches, with allergic rhinitis. Onset of HA, lightheadedness--particulary orthostatic, feeling very fatigued.  Says blood glucose about 1-2 hours after eating and they are 70s-80s.  HA described as throbbing, in frontal region.  No light or sound sensitivity.  No nausea.  Relpax no help, tylenol of little help. Used vicodin that she gets rx'd for neck pain but this didn't help much. Current allergy symptoms: stuffy/runny nose, sneezing, runny eyes.  NO cough.  No face or upper teeth pain. Takes singulair and flonase daily, generic zyrtec daily. Stool is chronically black and tarry, no BRBPR.  Describes easy bruising.  LMP started 01/15/17---heavy daily, usually until she starts her next pack.     Past Medical History:  Diagnosis Date  . Allergy   . 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 Anxiety and depression    started with PPD  . Bacterial vaginitis   . Chronic neck pain    Myofascial pain syndrome per [facility] [facility], oxycodone per their office pain med contract.  (Normal cervical MRI 2010 per ortho notes).  . Dietary iron deficiency without anemia   . Easy bruisability 2011   Hx of: saw Hematologist (Dr. Cyndie Chime) and w/u neg for von Willebrand desease.  Marland Kitchen GERD (gastroesophageal reflux disease)    hx gastric ulcer and upper GI bleed, ? NSAID induced  . History of stomach ulcers   . Hypertension   . Migraine   . Seizure (HCC) 2015   s/p eval with neuro, no medication, no events since  . Syncope    with ? seizure in 2015, eval with neruo  . Vitamin B12 deficiency   . Vitamin D deficiency     Past Surgical History:  Procedure Laterality Date  . CESAREAN SECTION  2006  . ENDOMETRIAL BIOPSY    . EYE SURGERY Bilateral    2001  . GASTRIC BYPASS  2002  .  TONSILLECTOMY AND ADENOIDECTOMY    . tubes in ears both Bilateral   . tubuligation  2006    Outpatient Medications Prior to Visit  Medication Sig Dispense Refill  . acetaminophen (TYLENOL) 500 MG tablet Take 1,000 mg by mouth every 6 (six) hours as needed for mild pain, moderate pain or headache.    Marland Kitchen amitriptyline (ELAVIL) 10 MG tablet Take 1 tablet (10 mg total) by mouth at bedtime. 30 tablet 11  . amitriptyline (ELAVIL) 25 MG tablet TAKE 1 TABLET BY MOUTH AT BEDTIME. 30 tablet 11  . BIOTIN PO Take 1 tablet by mouth daily.     . Calcium 250 MG CAPS Take 1 capsule by mouth daily.     . DULoxetine (CYMBALTA) 60 MG capsule Take 1 capsule (60 mg total) by mouth daily. 30 capsule 6  . eletriptan (RELPAX) 20 MG tablet 1-2 tabs po at onset of migraine HA.  May repeat in 2 hours if headache persists or recurs.   Max dosing in 24 hours is 80 mg. 10 tablet 6  . ferrous sulfate 325 (65 FE) MG EC tablet Take 325 mg by mouth daily.    . fluticasone (FLONASE) 50 MCG/ACT nasal spray Place 2 sprays into both nostrils daily. 16 g 12  . MONONESSA 0.25-35 MG-MCG tablet Take 1  tablet by mouth once daily  4  . montelukast (SINGULAIR) 10 MG tablet Take 1 tablet (10 mg total) by mouth at bedtime. 30 tablet 6  . omeprazole (PRILOSEC) 20 MG capsule TAKE 1 CAPSULE BY MOUTH 2 TIMES DAILY BEFORE A MEAL 60 capsule 11  . oxyCODONE-acetaminophen (PERCOCET) 10-325 MG per tablet Take 1 tablet by mouth every 8 (eight) hours as needed for pain. Take 1 tablet by mouth every 8 hours as needed for severe pain. 15 tablet 0  . triamcinolone cream (KENALOG) 0.1 % Apply 1 application topically 2 (two) times daily. 30 g 2  . vitamin B-12 (CYANOCOBALAMIN) 1000 MCG tablet Take 1,000 mcg by mouth daily.    . Vitamin D, Ergocalciferol, (DRISDOL) 50000 units CAPS capsule Take 1 capsule (50,000 Units total) by mouth every 7 (seven) days. 4 capsule 3  . Cholecalciferol (VITAMIN D) 2000 UNITS CAPS Take 1 capsule by mouth daily.     Marland Kitchen  CRANBERRY EXTRACT PO Take 1 capsule by mouth daily as needed (for symptoms).     No facility-administered medications prior to visit.     Allergies  Allergen Reactions  . Ibuprofen     Causes ulcers    ROS As per HPI  PE: Blood pressure (!) 139/91, pulse 73, temperature 98.1 F (36.7 C), temperature source Oral, resp. rate 16, height  (1.651 m), weight 255 lb 8 oz (115.9 kg), last menstrual period 01/15/2017, SpO2 100 %. Gen: Alert, well appearing.  Patient is oriented to person, place, time, and situation. AFFECT: pleasant, lucid thought and speech. ENT: Ears: EACs clear, normal epithelium.  TMs with good light reflex and landmarks bilaterally.  Eyes: no injection, icteris, swelling, or exudate.  EOMI, PERRLA. Nose: no drainage or turbinate edema/swelling.  No injection or focal lesion.  Mouth: lips without lesion/swelling.  Oral mucosa pink and moist.  Dentition intact and without obvious caries or gingival swelling.  Oropharynx without erythema, exudate, or swelling.  Mild tenderness in forehead, area of frontal sinuses, but no temporal tenderness.  No tenderness over maxillary sinuses. CV: RRR, no m/r/g.   LUNGS: CTA bilat, nonlabored resps, good aeration in all lung fields. Neuro: CN 2-12 intact bilaterally, strength 5/5 in proximal and distal upper extremities and lower extremities bilaterally.  No sensory deficits.  No tremor.    No ataxia.   No pronator drift.  LABS:  Lab Results  Component Value Date   TSH 1.56 11/01/2016   Lab Results  Component Value Date   WBC 5.8 11/01/2016   HGB 14.2 11/01/2016   HCT 42.1 11/01/2016   MCV 89.1 11/01/2016   PLT 271.0 11/01/2016   Lab Results  Component Value Date   CREATININE 0.75 11/01/2016   BUN 8 11/01/2016   NA 139 11/01/2016   K 4.4 11/01/2016   CL 103 11/01/2016   CO2 30 11/01/2016   Lab Results  Component Value Date   ALT 9 11/01/2016   AST 12 11/01/2016   ALKPHOS 70 11/01/2016   BILITOT 0.5 11/01/2016    Lab Results  Component Value Date   CHOL 236 (H) 11/01/2016   Lab Results  Component Value Date   HDL 76.90 11/01/2016   Lab Results  Component Value Date   LDLCALC 126 (H) 11/01/2016   Lab Results  Component Value Date   TRIG 166.0 (H) 11/01/2016   Lab Results  Component Value Date   CHOLHDL 3 11/01/2016   Lab Results  Component Value Date   IRON 135 11/01/2016  IRON 143 11/01/2016   TIBC 410 11/01/2016   FERRITIN 12.5 11/01/2016   Lab Results  Component Value Date   VITAMINB12 621 11/01/2016    IMPRESSION AND PLAN:  1) Allergic rhinitis/possible allergic sinusitis. Continue singulair, flonase, and zyrtec.  See prednisone taper below.  2) Persistent daily HA's, with fatigue.  Pt states these are not like her typical migraine HA's, but given severity of them I suspect they are migrainous.  We have no options for abortive med change at this time: relpax no help, tylenol no help, vicodin not an option (I expressed to her that this is NOT to be used for HA's), and she has had problems in the past with gastritis on NSAID (ibuprofen). Will try prednisone taper:  qd x 5d, then  qd x 5d, then  qd x 5d, then  qd x 4d.  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 Check: CBC w/diff. Monitor B12 and iron studies. As far as her low normal glucoses go, she may indeed feel hypoglycemic at this level but it is less likely. She'll continue to try to eat frequent small meals to avoid this. Work excuse for today and tomorrow written.  An After Visit Summary was printed and given to the patient.  FOLLOW UP: Return in about 4 weeks (around 02/14/2017) for f/u allergies/HA's.  Signed:  Santiago Bumpers, MD           01/17/2017

## 2017-01-17 NOTE — Progress Notes (Signed)
Pre visit review using our clinic review tool, if applicable. No additional management support is needed unless otherwise documented below in the visit note. 

## 2017-02-06 ENCOUNTER — Other Ambulatory Visit: Payer: Self-pay | Admitting: Gastroenterology

## 2017-02-06 ENCOUNTER — Encounter: Payer: Self-pay | Admitting: Family Medicine

## 2017-02-06 MED ORDER — OMEPRAZOLE 20 MG PO CPDR: DELAYED_RELEASE_CAPSULE | ORAL | 11 refills | Status: DC

## 2017-02-06 MED FILL — MONTELUKAST SOD 10 MG TAB: 10 | 30 days supply | Qty: 30 | Fill #6

## 2017-02-06 MED FILL — AMITRIPTYLINE HCL 25 MG TAB: 25 | 30 days supply | Qty: 30 | Fill #4

## 2017-02-06 MED FILL — VIT D2 1.25 MG (50,000 UNIT: 1.25 MG | 28 days supply | Qty: 4 | Fill #3

## 2017-02-06 MED FILL — OMEPRAZOLE DR 20 MG CAPSULE: 20 | 30 days supply | Qty: 60 | Fill #0

## 2017-02-06 MED FILL — ALPRAZolam 0.5 MG TABS: 0.5 | 15 days supply | Qty: 60 | Fill #4

## 2017-02-06 MED FILL — DULoxetine HCL 60 MG CPEP: 60 | 30 days supply | Qty: 30 | Fill #5

## 2017-02-06 MED FILL — AMITRIPTYLINE HCL 10 MG TAB: 10 | 30 days supply | Qty: 30 | Fill #7

## 2017-02-06 NOTE — Telephone Encounter (Signed)
RF request for omeprazole LOV: 01/17/17 Next ov: 02/21/17 Last written: 01/23/16 #60 w/ [id number]

## 2017-02-10 ENCOUNTER — Telehealth: Payer: Self-pay | Admitting: Family Medicine

## 2017-02-10 NOTE — Telephone Encounter (Signed)
Patient is requesting a Rx for Freestyle light meter(it is the only one covered by her insurance) lancet & testing strips. She states she is hypoglycemic. She used her mother's meter this morning and got a reading of 40. Her glucose tablet brought it back up to 70. [facility] [address].

## 2017-02-11 MED ORDER — BLOOD GLUCOSE METER KIT: PACK | 0 refills | Status: DC

## 2017-02-11 MED FILL — FREESTYLE LITE METER: 30 days supply | Qty: 1 | Fill #0

## 2017-02-11 MED FILL — FREESTYLE LANCETS: 25 days supply | Qty: 100 | Fill #0

## 2017-02-11 MED FILL — FREESTYLE LITE TEST STRIP: 25 days supply | Qty: 100 | Fill #0

## 2017-02-12 ENCOUNTER — Encounter: Payer: Self-pay | Admitting: Family Medicine

## 2017-02-12 MED ORDER — ALCOHOL WIPES 70 % PADS: MEDICATED_PAD | 11 refills | Status: DC

## 2017-02-21 ENCOUNTER — Encounter: Payer: Self-pay | Admitting: Family Medicine

## 2017-02-21 ENCOUNTER — Ambulatory Visit (INDEPENDENT_AMBULATORY_CARE_PROVIDER_SITE_OTHER): Payer: 59 | Admitting: Family Medicine

## 2017-02-21 VITALS — BP 129/90 | HR 68 | Temp 98.6°F | Resp 16 | Ht 65.0 in | Wt 263.2 lb

## 2017-02-21 DIAGNOSIS — K909 Intestinal malabsorption, unspecified: Secondary | ICD-10-CM | POA: Diagnosis not present

## 2017-02-21 DIAGNOSIS — F419 Anxiety disorder, unspecified: Secondary | ICD-10-CM

## 2017-02-21 DIAGNOSIS — M542 Cervicalgia: Secondary | ICD-10-CM | POA: Diagnosis not present

## 2017-02-21 DIAGNOSIS — G894 Chronic pain syndrome: Secondary | ICD-10-CM | POA: Diagnosis not present

## 2017-02-21 DIAGNOSIS — F409 Phobic anxiety disorder, unspecified: Secondary | ICD-10-CM | POA: Diagnosis not present

## 2017-02-21 DIAGNOSIS — J301 Allergic rhinitis due to pollen: Secondary | ICD-10-CM

## 2017-02-21 DIAGNOSIS — I10 Essential (primary) hypertension: Secondary | ICD-10-CM | POA: Diagnosis not present

## 2017-02-21 DIAGNOSIS — F329 Major depressive disorder, single episode, unspecified: Secondary | ICD-10-CM | POA: Diagnosis not present

## 2017-02-21 DIAGNOSIS — Z79891 Long term (current) use of opiate analgesic: Secondary | ICD-10-CM | POA: Diagnosis not present

## 2017-02-21 DIAGNOSIS — E559 Vitamin D deficiency, unspecified: Secondary | ICD-10-CM

## 2017-02-21 DIAGNOSIS — F5105 Insomnia due to other mental disorder: Secondary | ICD-10-CM | POA: Diagnosis not present

## 2017-02-21 MED ORDER — HYDROCHLOROTHIAZIDE 12.5 MG PO CAPS: 12.5000 mg | ORAL_CAPSULE | Freq: Every day | ORAL | 0 refills | Status: DC

## 2017-02-21 MED ORDER — CEPHALEXIN 500 MG PO CAPS: ORAL_CAPSULE | ORAL | 0 refills | Status: DC

## 2017-02-21 MED ORDER — CETIRIZINE HCL 10 MG PO TABS: 10.0000 mg | ORAL_TABLET | Freq: Every day | ORAL | 11 refills | Status: DC

## 2017-02-21 MED FILL — HYDROCHLOROTHIAZIDE 12.5 MG: 12.5 | 30 days supply | Qty: 30 | Fill #0

## 2017-02-21 NOTE — Patient Instructions (Signed)
Check your blood pressure and heart rate daily and write these numbers down for review with me in office in 2 wks.  Goal BP<120/80.

## 2017-02-21 NOTE — Progress Notes (Signed)
OFFICE VISIT  02/21/2017   CC:  Chief Complaint  Patient presents with  . Follow-up    [facility],    HPI:    Patient is a 41 y.o.  female who presents for 4 mo f/u HTN, hx of gastric bypass surgery after which she has had problems with vit B12, iron, and vit D deficiency, and anxiety/dep/insomnia.  Allergic rhinitis is a little better: "tolerable". Says HAs are gone. Says glucoses 80-90 about 2 hours after eating.  Feels tired around these times.  She usually eats food or takes glucose tab and this helps.  BP: she monitors bp and says they are a little high--like the one today (130/90).  Was on hctz in the past and was taken off it b/c another MD felt like it was causing dehydration b/c they had difficulty getting a blood draw.  She states she had no adverse effect from the med and "I'm always a hard stick". She is open to restarting this med.  Says anxiety level and mood area stable.  Sleep is fine on current meds.   Past Medical History:  Diagnosis Date  . Allergy   . 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 Anxiety and depression    started with PPD  . Bacterial vaginitis   . Chronic neck pain    Myofascial pain syndrome per [facility], oxycodone per their office pain med contract.  (Normal cervical MRI 2010 per ortho notes).  . Dietary iron deficiency without anemia   . Easy bruisability 2011   Hx of: saw Hematologist (Dr. Beryle Beams) and w/u neg for von Willebrand desease.  Marland Kitchen GERD (gastroesophageal reflux disease)    hx gastric ulcer and upper GI bleed, ? NSAID induced  . History of stomach ulcers   . Hypertension   . Migraine   . Seizure ([place]) 2015   s/p eval with neuro, no medication, no events since  . Syncope    with ? seizure in 2015, eval with neruo  . Vitamin B12 deficiency   . Vitamin D deficiency     Past Surgical History:  Procedure Laterality Date  . CESAREAN SECTION  2006  . ENDOMETRIAL BIOPSY    . EYE SURGERY Bilateral    2001  . GASTRIC  BYPASS  2002  . TONSILLECTOMY AND ADENOIDECTOMY    . tubes in ears both Bilateral   . tubuligation  2006    Outpatient Medications Prior to Visit  Medication Sig Dispense Refill  . acetaminophen (TYLENOL) 500 MG tablet Take 1,000 mg by mouth every 6 (six) hours as needed for mild pain, moderate pain or headache.    . Alcohol Swabs (ALCOHOL WIPES) 70 % PADS Use to clean finger before checking blood sugar, checks blood sugar 3-4 times daily 100 each 11  . ALPRAZolam (XANAX) 0.5 MG tablet Take 1 tablet by mouth daily as needed.  5  . amitriptyline (ELAVIL) 10 MG tablet Take 1 tablet (10 mg total) by mouth at bedtime. 30 tablet 11  . amitriptyline (ELAVIL) 25 MG tablet TAKE 1 TABLET BY MOUTH AT BEDTIME. 30 tablet 11  . BIOTIN PO Take 1 tablet by mouth daily.     . Calcium 250 MG CAPS Take 1 capsule by mouth daily.     . Cholecalciferol (VITAMIN D) 2000 units CAPS Take 1 capsule by mouth daily.    . DULoxetine (CYMBALTA) 60 MG capsule Take 1 capsule (60 mg total) by mouth daily. 30 capsule 6  . eletriptan (RELPAX) 20  MG tablet 1-2 tabs po at onset of migraine HA.  May repeat in 2 hours if headache persists or recurs.   Max dosing in 24 hours is 80 mg. 10 tablet 6  . ferrous sulfate 325 (65 FE) MG EC tablet Take 325 mg by mouth daily.    . fluticasone (FLONASE) 50 MCG/ACT nasal spray Place 2 sprays into both nostrils daily. 16 g 12  . MONONESSA 0.25-35 MG-MCG tablet Take 1 tablet by mouth once daily  4  . montelukast (SINGULAIR) 10 MG tablet Take 1 tablet (10 mg total) by mouth at bedtime. 30 tablet 6  . omeprazole (PRILOSEC) 20 MG capsule TAKE 1 CAPSULE BY MOUTH 2 TIMES DAILY BEFORE A MEAL 60 capsule 11  . oxyCODONE-acetaminophen (PERCOCET) 10-325 MG per tablet Take 1 tablet by mouth every 8 (eight) hours as needed for pain. Take 1 tablet by mouth every 8 hours as needed for severe pain. 15 tablet 0  . triamcinolone cream (KENALOG) 0.1 % Apply 1 application topically 2 (two) times daily. 30 g 2   . vitamin B-12 (CYANOCOBALAMIN) 1000 MCG tablet Take 1,000 mcg by mouth daily.    . Vitamin D, Ergocalciferol, (DRISDOL) 50000 units CAPS capsule Take 1 capsule (50,000 Units total) by mouth every 7 (seven) days. 4 capsule 3  . blood glucose meter kit and supplies Use to check blood sugar 3-4 times daily (Patient not taking: Reported on 02/21/2017) 1 each 0  . predniSONE (DELTASONE) 20 MG tablet 3 tabs po qd x 5d, then 2 tabs po qd x 5d, then 1 tab po qd x 5d, then 1/2 tab po qd x 4d (Patient not taking: Reported on 02/21/2017) 32 tablet 0   No facility-administered medications prior to visit.     Allergies  Allergen Reactions  . Ibuprofen     Causes ulcers    ROS As per HPI  PE: Blood pressure 129/90, pulse 68, temperature 98.6 F (37 C), temperature source Oral, resp. rate 16, height _0  (1.651 m), weight 263 lb 4 oz (119.4 kg), last menstrual period 02/13/2017, SpO2 100 %. Body mass index is 43.81 kg/m.  Gen: Alert, well appearing.  Patient is oriented to person, place, time, and situation. AFFECT: pleasant, lucid thought and speech. CV: RRR, no m/r/g.   LUNGS: CTA bilat, nonlabored resps, good aeration in all lung fields.   LABS:  Lab Results  Component Value Date   TSH 1.56 11/01/2016   Lab Results  Component Value Date   WBC 4.8 01/17/2017   HGB 14.6 01/17/2017   HCT 44.7 01/17/2017   MCV 92.4 01/17/2017   PLT 305.0 01/17/2017   Lab Results  Component Value Date   CREATININE 0.75 11/01/2016   BUN 8 11/01/2016   NA 139 11/01/2016   K 4.4 11/01/2016   CL 103 11/01/2016   CO2 30 11/01/2016   Lab Results  Component Value Date   ALT 9 11/01/2016   AST 12 11/01/2016   ALKPHOS 70 11/01/2016   BILITOT 0.5 11/01/2016   Lab Results  Component Value Date   CHOL 236 (H) 11/01/2016   Lab Results  Component Value Date   HDL 76.90 11/01/2016   Lab Results  Component Value Date   LDLCALC 126 (H) 11/01/2016   Lab Results  Component Value Date   TRIG 166.0  (H) 11/01/2016   Lab Results  Component Value Date   CHOLHDL 3 11/01/2016   Lab Results  Component Value Date   VITAMINB12 640 01/17/2017  Lab Results  Component Value Date   IRON 88 01/17/2017   IRON 100 01/17/2017   TIBC 417 01/17/2017   FERRITIN 16.4 01/17/2017   Vit D 11/01/16= 28.  IMPRESSION AND PLAN:  1) HTN; needs to restart hctz 12.24m qd. Check lytes/cr today and upon recheck in office in 2 weeks. Check bp and HR at home daily x 2 weeks and bring in for review with me.  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  Needs recheck of vit D level (has been on 5000 units qd for the last 4 mo) today. Her mild low BS's are eradicated with glucose tab or eating.  3) Anxiety/depression, with significant insomnia associated.  Doing well on duloxetine, prn xanax at low dose, and amitriptyline 320mqhs.  4) Allergic rhinitis: doing fairly well.  The current medical regimen is effective;  continue present plan and medications.  An After Visit Summary was printed and given to the patient.  FOLLOW UP: Return in about 2 weeks (around 03/07/2017) for f/u HTN.  Signed:  PhCrissie SicklesMD           02/21/2017

## 2017-02-27 ENCOUNTER — Other Ambulatory Visit (INDEPENDENT_AMBULATORY_CARE_PROVIDER_SITE_OTHER): Payer: 59

## 2017-02-27 DIAGNOSIS — E559 Vitamin D deficiency, unspecified: Secondary | ICD-10-CM | POA: Diagnosis not present

## 2017-02-27 DIAGNOSIS — I10 Essential (primary) hypertension: Secondary | ICD-10-CM

## 2017-02-28 LAB — BASIC METABOLIC PANEL
BUN: 11 mg/dL (ref 6–23)
CALCIUM: 9 mg/dL (ref 8.4–10.5)
CHLORIDE: 100 meq/L (ref 96–112)
CO2: 25 meq/L (ref 19–32)
Creatinine, Ser: 0.81 mg/dL (ref 0.40–1.20)
GFR: 100.04 mL/min (ref >60.00)
GLUCOSE: 89 mg/dL (ref 70–99)
Potassium: 3.7 mEq/L (ref 3.5–5.1)
Sodium: 135 mEq/L (ref 135–145)

## 2017-02-28 LAB — VITAMIN D 25 HYDROXY (VIT D DEFICIENCY, FRACTURES): VITD: 45.91 ng/mL (ref 30.00–100.00)

## 2017-03-04 ENCOUNTER — Encounter: Payer: Self-pay | Admitting: *Deleted

## 2017-03-06 ENCOUNTER — Other Ambulatory Visit: Payer: Self-pay | Admitting: *Deleted

## 2017-03-06 MED ORDER — VITAMIN D (ERGOCALCIFEROL) 1.25 MG (50000 UNIT) PO CAPS: 50000.0000 [IU] | ORAL_CAPSULE | ORAL | 3 refills | Status: DC

## 2017-03-06 MED FILL — VIT D2 1.25 MG (50,000 UNIT: 1.25 MG | 28 days supply | Qty: 4 | Fill #0

## 2017-03-06 NOTE — Telephone Encounter (Signed)
[facility].  RF request for Vitamin D LOV: 02/21/17 Next ov: 03/13/17 Last written: 11/05/16 #4 w/ 3RF  Please advise. Thanks.

## 2017-03-07 ENCOUNTER — Encounter: Payer: Self-pay | Admitting: Family Medicine

## 2017-03-07 MED ORDER — ALPRAZOLAM 0.5 MG PO TABS: 0.5000 mg | ORAL_TABLET | Freq: Every day | ORAL | 5 refills | Status: DC | PRN

## 2017-03-07 MED FILL — MONO-LINYAH 28 TABLET: 0.25-35 | 84 days supply | Qty: 84 | Fill #0

## 2017-03-07 MED FILL — ALPRAZolam 0.5 MG TABS: 0.5 | 30 days supply | Qty: 30 | Fill #0

## 2017-03-07 NOTE — Telephone Encounter (Signed)
RF request for alprazolam LOV: 02/21/17 Next ov: 03/13/17 Last written: 08/13/16 #60 w/ 5RF  Please advise. Thanks.

## 2017-03-13 ENCOUNTER — Ambulatory Visit: Payer: 59 | Admitting: Family Medicine

## 2017-03-13 NOTE — Progress Notes (Deleted)
OFFICE VISIT  03/13/2017   CC: No chief complaint on file.    HPI:    Patient is a 41 y.o.  female who presents for 2 week f/u HTN. BP consistently mildly elevated when checked at last visit so we started her on hctz 12.5mg  qd that she had tolerated well in the past.  Home bp monitoring:  Past Medical History:  Diagnosis Date  . Allergy   . 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 Anxiety and depression    started with PPD  . Bacterial vaginitis   . Chronic neck pain    Myofascial pain syndrome per [facility], oxycodone per their office pain med contract.  (Normal cervical MRI 2010 per ortho notes).  . Dietary iron deficiency without anemia   . Easy bruisability 2011   Hx of: saw Hematologist (Dr. Cyndie ChimeGranfortuna) and w/u neg for von Willebrand desease.  Marland Kitchen. GERD (gastroesophageal reflux disease)    hx gastric ulcer and upper GI bleed, ? NSAID induced  . History of stomach ulcers   . Hypertension   . Migraine   . Seizure ([facility]) 2015   s/p eval with neuro, no medication, no events since  . Syncope    with ? seizure in 2015, eval with neruo  . Vitamin B12 deficiency   . Vitamin D deficiency     Past Surgical History:  Procedure Laterality Date  . CESAREAN SECTION  2006  . ENDOMETRIAL BIOPSY    . EYE SURGERY Bilateral    2001  . GASTRIC BYPASS  2002  . TONSILLECTOMY AND ADENOIDECTOMY    . tubes in ears both Bilateral   . tubuligation  2006    Outpatient Medications Prior to Visit  Medication Sig Dispense Refill  . acetaminophen (TYLENOL) 500 MG tablet Take 1,000 mg by mouth every 6 (six) hours as needed for mild pain, moderate pain or headache.    . Alcohol Swabs (ALCOHOL WIPES) 70 % PADS Use to clean finger before checking blood sugar, checks blood sugar 3-4 times daily 100 each 11  . ALPRAZolam (XANAX) 0.5 MG tablet Take 1 tablet (0.5 mg total) by mouth daily as needed. 30 tablet 5  . amitriptyline (ELAVIL) 10 MG tablet Take 1 tablet (10 mg total) by mouth at  bedtime. 30 tablet 11  . amitriptyline (ELAVIL) 25 MG tablet TAKE 1 TABLET BY MOUTH AT BEDTIME. 30 tablet 11  . BIOTIN PO Take 1 tablet by mouth daily.     . Calcium 250 MG CAPS Take 1 capsule by mouth daily.     . cetirizine (ZYRTEC) 10 MG tablet Take 1 tablet (10 mg total) by mouth daily. 30 tablet 11  . Cholecalciferol (VITAMIN D) 2000 units CAPS Take 1 capsule by mouth daily.    . DULoxetine (CYMBALTA) 60 MG capsule Take 1 capsule (60 mg total) by mouth daily. 30 capsule 6  . eletriptan (RELPAX) 20 MG tablet 1-2 tabs po at onset of migraine HA.  May repeat in 2 hours if headache persists or recurs.   Max dosing in 24 hours is 80 mg. 10 tablet 6  . ferrous sulfate 325 (65 FE) MG EC tablet Take 325 mg by mouth daily.    . fluticasone (FLONASE) 50 MCG/ACT nasal spray Place 2 sprays into both nostrils daily. 16 g 12  . hydrochlorothiazide (MICROZIDE) 12.5 MG capsule Take 1 capsule (12.5 mg total) by mouth daily. 30 capsule 0  . MONONESSA 0.25-35 MG-MCG tablet Take 1 tablet by mouth once  daily  4  . montelukast (SINGULAIR) 10 MG tablet Take 1 tablet (10 mg total) by mouth at bedtime. 30 tablet 6  . omeprazole (PRILOSEC) 20 MG capsule TAKE 1 CAPSULE BY MOUTH 2 TIMES DAILY BEFORE A MEAL 60 capsule 11  . oxyCODONE-acetaminophen (PERCOCET) 10-325 MG per tablet Take 1 tablet by mouth every 8 (eight) hours as needed for pain. Take 1 tablet by mouth every 8 hours as needed for severe pain. 15 tablet 0  . triamcinolone cream (KENALOG) 0.1 % Apply 1 application topically 2 (two) times daily. 30 g 2  . vitamin B-12 (CYANOCOBALAMIN) 1000 MCG tablet Take 1,000 mcg by mouth daily.    . Vitamin D, Ergocalciferol, (DRISDOL) 50000 units CAPS capsule Take 1 capsule (50,000 Units total) by mouth every 7 (seven) days. 4 capsule 3   No facility-administered medications prior to visit.     Allergies  Allergen Reactions  . Ibuprofen     Causes ulcers    ROS As per HPI  PE: Last menstrual period  02/13/2017. ***  LABS:    Chemistry      Component Value Date/Time   NA 135 02/27/2017 1705   K 3.7 02/27/2017 1705   CL 100 02/27/2017 1705   CO2 25 02/27/2017 1705   BUN 11 02/27/2017 1705   CREATININE 0.81 02/27/2017 1705      Component Value Date/Time   CALCIUM 9.0 02/27/2017 1705   ALKPHOS 70 11/01/2016 0928   AST 12 11/01/2016 0928   ALT 9 11/01/2016 0928   BILITOT 0.5 11/01/2016 0928      IMPRESSION AND PLAN:  No problem-specific Assessment & Plan notes found for this encounter.   FOLLOW UP: No Follow-up on file.

## 2017-03-14 ENCOUNTER — Encounter: Payer: Self-pay | Admitting: Family Medicine

## 2017-03-18 ENCOUNTER — Encounter: Payer: Self-pay | Admitting: Family Medicine

## 2017-03-19 ENCOUNTER — Other Ambulatory Visit: Payer: Self-pay | Admitting: *Deleted

## 2017-03-19 MED ORDER — HYDROCHLOROTHIAZIDE 12.5 MG PO CAPS: 12.5000 mg | ORAL_CAPSULE | Freq: Every day | ORAL | 6 refills | Status: DC

## 2017-03-19 MED ORDER — MONTELUKAST SODIUM 10 MG PO TABS: 10.0000 mg | ORAL_TABLET | Freq: Every day | ORAL | 6 refills | Status: DC

## 2017-03-19 MED FILL — AMITRIPTYLINE HCL 25 MG TAB: 25 | 30 days supply | Qty: 30 | Fill #5

## 2017-03-19 MED FILL — DULoxetine HCL 60 MG CPEP: 60 | 30 days supply | Qty: 30 | Fill #6

## 2017-03-19 MED FILL — HYDROCHLOROTHIAZIDE 12.5 MG: 12.5 | 30 days supply | Qty: 30 | Fill #0

## 2017-03-19 MED FILL — MONTELUKAST SOD 10 MG TAB: 10 | 30 days supply | Qty: 30 | Fill #0

## 2017-03-19 MED FILL — AMITRIPTYLINE HCL 10 MG TAB: 10 | 30 days supply | Qty: 30 | Fill #8

## 2017-03-19 MED FILL — OMEPRAZOLE DR 20 MG CAPSULE: 20 | 30 days supply | Qty: 60 | Fill #1

## 2017-03-19 NOTE — Telephone Encounter (Signed)
[facility]  RF request for montelukast LOV: 02/21/17 Next ov: 03/29/17 Last written: 08/06/16 #30 w/ 6RF  RF request for hctz Last written: 02/21/17 #30 w/ Wende [id number]

## 2017-03-20 DIAGNOSIS — Z124 Encounter for screening for malignant neoplasm of cervix: Secondary | ICD-10-CM | POA: Diagnosis not present

## 2017-03-20 DIAGNOSIS — Z113 Encounter for screening for infections with a predominantly sexual mode of transmission: Secondary | ICD-10-CM | POA: Diagnosis not present

## 2017-03-20 DIAGNOSIS — Z1231 Encounter for screening mammogram for malignant neoplasm of breast: Secondary | ICD-10-CM | POA: Diagnosis not present

## 2017-03-20 DIAGNOSIS — Z01419 Encounter for gynecological examination (general) (routine) without abnormal findings: Secondary | ICD-10-CM | POA: Diagnosis not present

## 2017-03-20 DIAGNOSIS — Z6841 Body Mass Index (BMI) 40.0 and over, adult: Secondary | ICD-10-CM | POA: Diagnosis not present

## 2017-03-20 LAB — HM MAMMOGRAPHY

## 2017-03-31 MED FILL — VIT D2 1.25 MG (50,000 UNIT: 1.25 MG | 28 days supply | Qty: 4 | Fill #1

## 2017-04-03 ENCOUNTER — Ambulatory Visit (INDEPENDENT_AMBULATORY_CARE_PROVIDER_SITE_OTHER): Payer: 59 | Admitting: Family Medicine

## 2017-04-03 ENCOUNTER — Encounter: Payer: Self-pay | Admitting: Family Medicine

## 2017-04-03 VITALS — BP 124/84 | HR 80 | Temp 98.7°F | Resp 16 | Ht 65.0 in | Wt 262.5 lb

## 2017-04-03 DIAGNOSIS — I10 Essential (primary) hypertension: Secondary | ICD-10-CM

## 2017-04-03 MED ORDER — HYDROCHLOROTHIAZIDE 25 MG PO TABS: 25.0000 mg | ORAL_TABLET | Freq: Every day | ORAL | 1 refills | Status: DC

## 2017-04-03 NOTE — Progress Notes (Signed)
OFFICE VISIT  04/03/2017   CC:  Chief Complaint  Patient presents with  . Follow-up    HTN   HPI:    Patient is a 41 y.o.  female who presents for 6 week f/u HTN. Last visit I started hctz 12.5mg  qd.  Home bp's 120s systolic mostly, some 140s.  Diastolic still low 90s. No side effects from hctz.  No HA, dizziness, vision c/o, CP, or SOB.  Past Medical History:  Diagnosis Date  . Allergy   . 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 Anxiety and depression    started with PPD  . Bacterial vaginitis   . Chronic neck pain    Myofascial pain syndrome per [facility], oxycodone per their office pain med contract.  (Normal cervical MRI 2010 per ortho notes).  . Dietary iron deficiency without anemia   . Easy bruisability 2011   Hx of: saw Hematologist (Dr. Cyndie Chime) and w/u neg for von Willebrand desease.  Marland Kitchen GERD (gastroesophageal reflux disease)    hx gastric ulcer and upper GI bleed, ? NSAID induced  . History of stomach ulcers   . Hypertension   . Migraine   . Seizure ([facility]) 2015   s/p eval with neuro, no medication, no events since  . Syncope    with ? seizure in 2015, eval with neruo  . Vitamin B12 deficiency   . Vitamin D deficiency     Past Surgical History:  Procedure Laterality Date  . CESAREAN SECTION  2006  . ENDOMETRIAL BIOPSY    . EYE SURGERY Bilateral    2001  . GASTRIC BYPASS  2002  . TONSILLECTOMY AND ADENOIDECTOMY    . tubes in ears both Bilateral   . tubuligation  2006    Outpatient Medications Prior to Visit  Medication Sig Dispense Refill  . acetaminophen (TYLENOL) 500 MG tablet Take 1,000 mg by mouth every 6 (six) hours as needed for mild pain, moderate pain or headache.    . Alcohol Swabs (ALCOHOL WIPES) 70 % PADS Use to clean finger before checking blood sugar, checks blood sugar 3-4 times daily 100 each 11  . ALPRAZolam (XANAX) 0.5 MG tablet Take 1 tablet (0.5 mg total) by mouth daily as needed. 30 tablet 5  . amitriptyline (ELAVIL)  10 MG tablet Take 1 tablet (10 mg total) by mouth at bedtime. 30 tablet 11  . amitriptyline (ELAVIL) 25 MG tablet TAKE 1 TABLET BY MOUTH AT BEDTIME. 30 tablet 11  . BIOTIN PO Take 1 tablet by mouth daily.     . Calcium 250 MG CAPS Take 1 capsule by mouth daily.     . cetirizine (ZYRTEC) 10 MG tablet Take 1 tablet (10 mg total) by mouth daily. 30 tablet 11  . Cholecalciferol (VITAMIN D) 2000 units CAPS Take 1 capsule by mouth daily.    . DULoxetine (CYMBALTA) 60 MG capsule Take 1 capsule (60 mg total) by mouth daily. 30 capsule 6  . eletriptan (RELPAX) 20 MG tablet 1-2 tabs po at onset of migraine HA.  May repeat in 2 hours if headache persists or recurs.   Max dosing in 24 hours is 80 mg. 10 tablet 6  . ferrous sulfate 325 (65 FE) MG EC tablet Take 325 mg by mouth daily.    . fluticasone (FLONASE) 50 MCG/ACT nasal spray Place 2 sprays into both nostrils daily. 16 g 12  . MONONESSA 0.25-35 MG-MCG tablet Take 1 tablet by mouth once daily  4  . montelukast (  SINGULAIR) 10 MG tablet Take 1 tablet (10 mg total) by mouth at bedtime. 30 tablet 6  . omeprazole (PRILOSEC) 20 MG capsule TAKE 1 CAPSULE BY MOUTH 2 TIMES DAILY BEFORE A MEAL 60 capsule 11  . oxyCODONE-acetaminophen (PERCOCET) 10-325 MG per tablet Take 1 tablet by mouth every 8 (eight) hours as needed for pain. Take 1 tablet by mouth every 8 hours as needed for severe pain. 15 tablet 0  . triamcinolone cream (KENALOG) 0.1 % Apply 1 application topically 2 (two) times daily. 30 g 2  . vitamin B-12 (CYANOCOBALAMIN) 1000 MCG tablet Take 1,000 mcg by mouth daily.    . Vitamin D, Ergocalciferol, (DRISDOL) 50000 units CAPS capsule Take 1 capsule (50,000 Units total) by mouth every 7 (seven) days. 4 capsule 3  . hydrochlorothiazide (MICROZIDE) 12.5 MG capsule Take 1 capsule (12.5 mg total) by mouth daily. 30 capsule 6   No facility-administered medications prior to visit.     Allergies  Allergen Reactions  . Ibuprofen     Causes ulcers     ROS As per HPI  PE: Blood pressure 124/84, pulse 80, temperature 98.7 F (37.1 C), temperature source Oral, resp. rate 16, height 5\' 5"  (1.651 m), weight 262 lb 8 oz (119.1 kg), last menstrual period 03/11/2017, SpO2 100 %. Gen: Alert, well appearing.  Patient is oriented to person, place, time, and situation. CV: RRR, no m/r/g.   LUNGS: CTA bilat, nonlabored resps, good aeration in all lung fields. EXT: no clubbing, cyanosis, or edema.    LABS:    Chemistry      Component Value Date/Time   NA 135 02/27/2017 1705   K 3.7 02/27/2017 1705   CL 100 02/27/2017 1705   CO2 25 02/27/2017 1705   BUN 11 02/27/2017 1705   CREATININE 0.81 02/27/2017 1705      Component Value Date/Time   CALCIUM 9.0 02/27/2017 1705   ALKPHOS 70 11/01/2016 0928   AST 12 11/01/2016 0928   ALT 9 11/01/2016 0928   BILITOT 0.5 11/01/2016 0928      IMPRESSION AND PLAN:  HTN; not ideal control. Increase hctz to 25mg  qd. Will hold off on lytes/cr recheck today and do this when I see her back for f/u HTN in 1 mo.  An After Visit Summary was printed and given to the patient.  FOLLOW UP: Return in about 4 weeks (around 05/01/2017) for f/u HTN.  Signed:  Santiago BumpersPhil Alek Borges, MD           04/03/2017

## 2017-04-07 MED FILL — ALPRAZolam 0.5 MG TABS: 0.5 | 30 days supply | Qty: 30 | Fill #1

## 2017-04-14 ENCOUNTER — Encounter: Payer: Self-pay | Admitting: Family Medicine

## 2017-04-16 ENCOUNTER — Encounter: Payer: Self-pay | Admitting: Family Medicine

## 2017-04-17 MED ORDER — DULOXETINE HCL 60 MG PO CPEP: 60.0000 mg | ORAL_CAPSULE | Freq: Every day | ORAL | 6 refills | Status: DC

## 2017-04-17 MED FILL — AMITRIPTYLINE HCL 25 MG TAB: 25 | 30 days supply | Qty: 30 | Fill #6

## 2017-04-17 MED FILL — OMEPRAZOLE DR 20 MG CAPSULE: 20 | 30 days supply | Qty: 60 | Fill #2

## 2017-04-17 MED FILL — HYDROCHLOROTHIAZIDE 25 MG T: 25 | 30 days supply | Qty: 30 | Fill #0

## 2017-04-17 MED FILL — AMITRIPTYLINE HCL 10 MG TAB: 10 | 30 days supply | Qty: 30 | Fill #9

## 2017-04-17 MED FILL — MONTELUKAST SOD 10 MG TAB: 10 | 30 days supply | Qty: 30 | Fill #1

## 2017-04-17 MED FILL — DULoxetine HCL 60 MG CPEP: 60 | 30 days supply | Qty: 30 | Fill #0

## 2017-04-17 NOTE — Telephone Encounter (Signed)
RF request for duloxetine LOV: 02/21/17 Next ov: 05/02/17 Last written: 09/03/16 #30 w/ 6RF

## 2017-05-01 ENCOUNTER — Encounter: Payer: Self-pay | Admitting: Family Medicine

## 2017-05-01 ENCOUNTER — Ambulatory Visit (INDEPENDENT_AMBULATORY_CARE_PROVIDER_SITE_OTHER): Payer: 59 | Admitting: Family Medicine

## 2017-05-01 VITALS — BP 142/80 | HR 88 | Temp 99.0°F | Resp 16 | Ht 65.0 in | Wt 254.0 lb

## 2017-05-01 DIAGNOSIS — I10 Essential (primary) hypertension: Secondary | ICD-10-CM

## 2017-05-01 LAB — BASIC METABOLIC PANEL
BUN: 7 mg/dL (ref 6–23)
CHLORIDE: 103 meq/L (ref 96–112)
CO2: 24 meq/L (ref 19–32)
CREATININE: 0.74 mg/dL (ref 0.40–1.20)
Calcium: 9.4 mg/dL (ref 8.4–10.5)
GFR: 110.94 mL/min (ref >60.00)
Glucose, Bld: 89 mg/dL (ref 70–99)
Potassium: 4.5 mEq/L (ref 3.5–5.1)
Sodium: 138 mEq/L (ref 135–145)

## 2017-05-01 MED ORDER — METOPROLOL SUCCINATE ER 25 MG PO TB24: 25.0000 mg | ORAL_TABLET | Freq: Every day | ORAL | 1 refills | Status: DC

## 2017-05-01 MED FILL — METOPROLOL SUCC ER 25 MG TA: 25 | 30 days supply | Qty: 30 | Fill #0

## 2017-05-01 MED FILL — FLUTICASONE PROP 50 MCG SPR: 50 | 30 days supply | Qty: 16 | Fill #3

## 2017-05-01 NOTE — Progress Notes (Signed)
OFFICE VISIT  05/01/2017   CC:  Chief Complaint  Patient presents with  . Follow-up    [facility], pt is fasting.    HPI:    Patient is a 41 y.o.  female who presents for 4 week f/u uncontrolled HTN. Last visit we increased her hctz to 25 mg daily.  Home bp's: avg 140/mid 80s.   No side effects on hctz 25mg  qd.  ROS: no CP, dizziness, palpitations, or SOB.  Past Medical History:  Diagnosis Date  . Allergy   . 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 Anxiety and depression    started with PPD  . Bacterial vaginitis   . Chronic neck pain    Myofascial pain syndrome per [facility] [facility], oxycodone per their office pain med contract.  (Normal cervical MRI 2010 per ortho notes).  . Dietary iron deficiency without anemia   . Easy bruisability 2011   Hx of: saw Hematologist (Dr. Cyndie ChimeGranfortuna) and w/u neg for von Willebrand desease.  Marland Kitchen. GERD (gastroesophageal reflux disease)    hx gastric ulcer and upper GI bleed, ? NSAID induced  . History of stomach ulcers   . Hypertension   . Migraine   . Seizure ([facility]) 2015   s/p eval with neuro, no medication, no events since  . Syncope    with ? seizure in 2015, eval with neruo  . Vitamin B12 deficiency   . Vitamin D deficiency     Past Surgical History:  Procedure Laterality Date  . CESAREAN SECTION  2006  . ENDOMETRIAL BIOPSY    . EYE SURGERY Bilateral    2001  . GASTRIC BYPASS  2002  . TONSILLECTOMY AND ADENOIDECTOMY    . tubes in ears both Bilateral   . tubuligation  2006    Outpatient Medications Prior to Visit  Medication Sig Dispense Refill  . acetaminophen (TYLENOL) 500 MG tablet Take 1,000 mg by mouth every 6 (six) hours as needed for mild pain, moderate pain or headache.    . Alcohol Swabs (ALCOHOL WIPES) 70 % PADS Use to clean finger before checking blood sugar, checks blood sugar 3-4 times daily 100 each 11  . ALPRAZolam (XANAX) 0.5 MG tablet Take 1 tablet (0.5 mg total) by mouth daily as needed. 30 tablet 5  .  amitriptyline (ELAVIL) 10 MG tablet Take 1 tablet (10 mg total) by mouth at bedtime. 30 tablet 11  . amitriptyline (ELAVIL) 25 MG tablet TAKE 1 TABLET BY MOUTH AT BEDTIME. 30 tablet 11  . BIOTIN PO Take 1 tablet by mouth daily.     . Calcium 250 MG CAPS Take 1 capsule by mouth daily.     . cetirizine (ZYRTEC) 10 MG tablet Take 1 tablet (10 mg total) by mouth daily. 30 tablet 11  . Cholecalciferol (VITAMIN D) 2000 units CAPS Take 1 capsule by mouth daily.    . DULoxetine (CYMBALTA) 60 MG capsule Take 1 capsule (60 mg total) by mouth daily. 30 capsule 6  . eletriptan (RELPAX) 20 MG tablet 1-2 tabs po at onset of migraine HA.  May repeat in 2 hours if headache persists or recurs.   Max dosing in 24 hours is 80 mg. 10 tablet 6  . ferrous sulfate 325 (65 FE) MG EC tablet Take 325 mg by mouth daily.    . fluticasone (FLONASE) 50 MCG/ACT nasal spray Place 2 sprays into both nostrils daily. 16 g 12  . hydrochlorothiazide (HYDRODIURIL) 25 MG tablet Take 1 tablet (25 mg total) by  mouth daily. 30 tablet 1  . MONONESSA 0.25-35 MG-MCG tablet Take 1 tablet by mouth once daily  4  . montelukast (SINGULAIR) 10 MG tablet Take 1 tablet (10 mg total) by mouth at bedtime. 30 tablet 6  . omeprazole (PRILOSEC) 20 MG capsule TAKE 1 CAPSULE BY MOUTH 2 TIMES DAILY BEFORE A MEAL 60 capsule 11  . oxyCODONE-acetaminophen (PERCOCET) 10-325 MG per tablet Take 1 tablet by mouth every 8 (eight) hours as needed for pain. Take 1 tablet by mouth every 8 hours as needed for severe pain. 15 tablet 0  . triamcinolone cream (KENALOG) 0.1 % Apply 1 application topically 2 (two) times daily. 30 g 2  . vitamin B-12 (CYANOCOBALAMIN) 1000 MCG tablet Take 1,000 mcg by mouth daily.    . Vitamin D, Ergocalciferol, (DRISDOL) 50000 units CAPS capsule Take 1 capsule (50,000 Units total) by mouth every 7 (seven) days. 4 capsule 3   No facility-administered medications prior to visit.     Allergies  Allergen Reactions  . Ibuprofen     Causes  ulcers    ROS As per HPI  PE: Blood pressure (!) 142/80, pulse 88, temperature 99 F (37.2 C), temperature source Oral, resp. rate 16, height 5\' 5"  (1.651 m), weight 254 lb (115.2 kg), last menstrual period 04/09/2017, SpO2 99 %. Gen: Alert, well appearing.  Patient is oriented to person, place, time, and situation. CV: RRR, no m/r/g.   LUNGS: CTA bilat, nonlabored resps, good aeration in all lung fields. EXT: no clubbing, cyanosis, or edema.    LABS:    Chemistry      Component Value Date/Time   NA 135 02/27/2017 1705   K 3.7 02/27/2017 1705   CL 100 02/27/2017 1705   CO2 25 02/27/2017 1705   BUN 11 02/27/2017 1705   CREATININE 0.81 02/27/2017 1705      Component Value Date/Time   CALCIUM 9.0 02/27/2017 1705   ALKPHOS 70 11/01/2016 0928   AST 12 11/01/2016 0928   ALT 9 11/01/2016 0928   BILITOT 0.5 11/01/2016 0928     Lab Results  Component Value Date   CHOL 236 (H) 11/01/2016   HDL 76.90 11/01/2016   LDLCALC 126 (H) 11/01/2016   TRIG 166.0 (H) 11/01/2016   CHOLHDL 3 11/01/2016   Lab Results  Component Value Date   WBC 4.8 01/17/2017   HGB 14.6 01/17/2017   HCT 44.7 01/17/2017   MCV 92.4 01/17/2017   PLT 305.0 01/17/2017   Lab Results  Component Value Date   VITAMINB12 640 01/17/2017   Lab Results  Component Value Date   IRON 88 01/17/2017   IRON 100 01/17/2017   TIBC 417 01/17/2017   FERRITIN 16.4 01/17/2017   Vit D 02/27/17= 46  IMPRESSION AND PLAN:  1) Uncontrolled HTN: continue hctz 25mg  qd and check BMET today. Add toprol XL 25mg  qd. Continue outside-office bp/HR monitoring.  An After Visit Summary was printed and given to the patient.  FOLLOW UP: Return in about 6 weeks (around 06/12/2017) for f/u uncontrolled htn.  Signed:  Santiago BumpersPhil Thunder [place], MD           05/01/2017

## 2017-05-02 ENCOUNTER — Ambulatory Visit: Payer: 59 | Admitting: Family Medicine

## 2017-05-02 ENCOUNTER — Encounter: Payer: Self-pay | Admitting: *Deleted

## 2017-05-06 MED FILL — ALPRAZolam 0.5 MG TABS: 0.5 | 30 days supply | Qty: 30 | Fill #2

## 2017-05-06 MED FILL — VIT D2 1.25 MG (50,000 UNIT: 1.25 MG | 28 days supply | Qty: 4 | Fill #2

## 2017-05-12 ENCOUNTER — Other Ambulatory Visit: Payer: Self-pay | Admitting: *Deleted

## 2017-05-12 MED ORDER — IRBESARTAN-HYDROCHLOROTHIAZIDE 150-12.5 MG PO TABS: 1.0000 | ORAL_TABLET | Freq: Every day | ORAL | 6 refills | Status: DC

## 2017-05-12 NOTE — Telephone Encounter (Signed)
Valsartan/hctz changed to irbesartan/hctz--I eRx'd med today.

## 2017-05-12 NOTE — Telephone Encounter (Signed)
Please disregard message below. Rx cancelled. Sent in error.

## 2017-05-26 ENCOUNTER — Encounter: Payer: Self-pay | Admitting: Family Medicine

## 2017-05-26 NOTE — Telephone Encounter (Signed)
Please advise. Thanks.  

## 2017-05-26 NOTE — Telephone Encounter (Signed)
I printed note for her.

## 2017-05-26 NOTE — Telephone Encounter (Signed)
SW pt and got verbal okay to give work note to her husband Rolm Gala when he came in for his office visit today. Work note given to pts husband.

## 2017-05-28 MED FILL — METOPROLOL SUCC ER 25 MG TA: 25 | 30 days supply | Qty: 30 | Fill #1

## 2017-05-28 MED FILL — HYDROCHLOROTHIAZIDE 25 MG T: 25 | 30 days supply | Qty: 30 | Fill #1

## 2017-05-28 MED FILL — AMITRIPTYLINE HCL 10 MG TAB: 10 | 30 days supply | Qty: 30 | Fill #10

## 2017-05-28 MED FILL — MONO-LINYAH 28 TABLET: 0.25-35 | 84 days supply | Qty: 84 | Fill #1

## 2017-05-28 MED FILL — VIT D2 1.25 MG (50,000 UNIT: 1.25 MG | 28 days supply | Qty: 4 | Fill #3

## 2017-05-28 MED FILL — OMEPRAZOLE 20 MG CAP: 20 | 30 days supply | Qty: 60 | Fill #3

## 2017-05-28 MED FILL — DULoxetine HCL 60 MG CPEP: 60 | 30 days supply | Qty: 30 | Fill #1

## 2017-05-28 MED FILL — MONTELUKAST SOD 10 MG TAB: 10 | 30 days supply | Qty: 30 | Fill #2

## 2017-05-28 MED FILL — AMITRIPTYLINE HCL 25 MG TAB: 25 | 30 days supply | Qty: 30 | Fill #7

## 2017-05-29 ENCOUNTER — Encounter: Payer: Self-pay | Admitting: Family Medicine

## 2017-05-29 DIAGNOSIS — E162 Hypoglycemia, unspecified: Secondary | ICD-10-CM

## 2017-05-29 NOTE — Telephone Encounter (Signed)
I will do the note, but I am going to refer her to an endocrinologist to further evaluate her for why she is getting symptomatic hypoglycemia.  I'll enter order now.-thx

## 2017-05-29 NOTE — Telephone Encounter (Signed)
Please advise. Thanks.  

## 2017-06-04 ENCOUNTER — Encounter: Payer: Self-pay | Admitting: Family Medicine

## 2017-06-04 DIAGNOSIS — N760 Acute vaginitis: Secondary | ICD-10-CM | POA: Diagnosis not present

## 2017-06-04 DIAGNOSIS — N390 Urinary tract infection, site not specified: Secondary | ICD-10-CM | POA: Diagnosis not present

## 2017-06-04 NOTE — Telephone Encounter (Signed)
I got it. I just have not gotten to it yet. I'll work on getting to it as fast as I can-thx

## 2017-06-04 NOTE — Telephone Encounter (Signed)
I think I put pts FMLA forms on your desk. Do you still have them?

## 2017-06-05 ENCOUNTER — Ambulatory Visit: Payer: Self-pay | Admitting: Family Medicine

## 2017-06-05 ENCOUNTER — Encounter: Payer: Self-pay | Admitting: Family Medicine

## 2017-06-10 MED FILL — ALPRAZolam 0.5 MG TABS: 0.5 | 30 days supply | Qty: 30 | Fill #3

## 2017-06-12 ENCOUNTER — Telehealth: Payer: Self-pay | Admitting: Family Medicine

## 2017-06-12 ENCOUNTER — Ambulatory Visit: Payer: 59 | Admitting: Family Medicine

## 2017-06-12 ENCOUNTER — Ambulatory Visit (INDEPENDENT_AMBULATORY_CARE_PROVIDER_SITE_OTHER): Payer: 59 | Admitting: Family Medicine

## 2017-06-12 ENCOUNTER — Encounter: Payer: Self-pay | Admitting: Family Medicine

## 2017-06-12 VITALS — BP 96/65 | HR 79 | Temp 98.2°F | Resp 16 | Ht 65.0 in | Wt 248.8 lb

## 2017-06-12 DIAGNOSIS — I10 Essential (primary) hypertension: Secondary | ICD-10-CM | POA: Diagnosis not present

## 2017-06-12 DIAGNOSIS — E162 Hypoglycemia, unspecified: Secondary | ICD-10-CM

## 2017-06-12 DIAGNOSIS — N3000 Acute cystitis without hematuria: Secondary | ICD-10-CM

## 2017-06-12 DIAGNOSIS — R300 Dysuria: Secondary | ICD-10-CM | POA: Diagnosis not present

## 2017-06-12 LAB — POCT URINALYSIS DIPSTICK
Blood, UA: NEGATIVE
GLUCOSE UA: NEGATIVE
Ketones, UA: NEGATIVE
LEUKOCYTES UA: NEGATIVE
NITRITE UA: NEGATIVE
Protein, UA: 30
Spec Grav, UA: >=1.03 — AB (ref 1.010–1.025)
Urobilinogen, UA: 0.2 E.U./dL
pH, UA: 5 (ref 5.0–8.0)

## 2017-06-12 MED ORDER — METOPROLOL SUCCINATE ER 25 MG PO TB24: 25.0000 mg | ORAL_TABLET | Freq: Every day | ORAL | 6 refills | Status: DC

## 2017-06-12 MED ORDER — CIPROFLOXACIN HCL 500 MG PO TABS: 500.0000 mg | ORAL_TABLET | Freq: Two times a day (BID) | ORAL | 0 refills | Status: AC

## 2017-06-12 NOTE — Progress Notes (Deleted)
OFFICE VISIT  06/12/2017   CC: No chief complaint on file.    HPI:    Patient is a 41 y.o.  female who presents for 6 week f/u uncontrolled HTN. Last visit I added Toprol XL 25mg  qd.  Home BP monitoring:   Past Medical History:  Diagnosis Date  . Allergy   . 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 Anxiety and depression    started with PPD  . Bacterial vaginitis   . Chronic neck pain    Myofascial pain syndrome per [facility], oxycodone per their office pain med contract.  (Normal cervical MRI 2010 per ortho notes).  . Dietary iron deficiency without anemia   . Easy bruisability 2011   Hx of: saw Hematologist (Dr. Cyndie ChimeGranfortuna) and w/u neg for von Willebrand desease.  Marland Kitchen. GERD (gastroesophageal reflux disease)    hx gastric ulcer and upper GI bleed, ? NSAID induced  . History of stomach ulcers   . Hypertension   . Migraine   . Seizure (HCC) 2015   s/p eval with neuro, no medication, no events since  . Syncope    with ? seizure in 2015, eval with neruo  . Vitamin B12 deficiency   . Vitamin D deficiency     Past Surgical History:  Procedure Laterality Date  . CESAREAN SECTION  2006  . ENDOMETRIAL BIOPSY    . EYE SURGERY Bilateral    2001  . GASTRIC BYPASS  2002  . TONSILLECTOMY AND ADENOIDECTOMY    . tubes in ears both Bilateral   . tubuligation  2006    Outpatient Medications Prior to Visit  Medication Sig Dispense Refill  . acetaminophen (TYLENOL) 500 MG tablet Take 1,000 mg by mouth every 6 (six) hours as needed for mild pain, moderate pain or headache.    . Alcohol Swabs (ALCOHOL WIPES) 70 % PADS Use to clean finger before checking blood sugar, checks blood sugar 3-4 times daily 100 each 11  . ALPRAZolam (XANAX) 0.5 MG tablet Take 1 tablet (0.5 mg total) by mouth daily as needed. 30 tablet 5  . amitriptyline (ELAVIL) 10 MG tablet Take 1 tablet (10 mg total) by mouth at bedtime. 30 tablet 11  . amitriptyline (ELAVIL) 25 MG tablet TAKE 1 TABLET BY MOUTH  AT BEDTIME. 30 tablet 11  . BIOTIN PO Take 1 tablet by mouth daily.     . Calcium 250 MG CAPS Take 1 capsule by mouth daily.     . cetirizine (ZYRTEC) 10 MG tablet Take 1 tablet (10 mg total) by mouth daily. 30 tablet 11  . Cholecalciferol (VITAMIN D) 2000 units CAPS Take 1 capsule by mouth daily.    . DULoxetine (CYMBALTA) 60 MG capsule Take 1 capsule (60 mg total) by mouth daily. 30 capsule 6  . eletriptan (RELPAX) 20 MG tablet 1-2 tabs po at onset of migraine HA.  May repeat in 2 hours if headache persists or recurs.   Max dosing in 24 hours is 80 mg. 10 tablet 6  . ferrous sulfate 325 (65 FE) MG EC tablet Take 325 mg by mouth daily.    . fluticasone (FLONASE) 50 MCG/ACT nasal spray Place 2 sprays into both nostrils daily. 16 g 12  . hydrochlorothiazide (HYDRODIURIL) 25 MG tablet Take 1 tablet (25 mg total) by mouth daily. 30 tablet 1  . metoprolol succinate (TOPROL-XL) 25 MG 24 hr tablet Take 1 tablet (25 mg total) by mouth daily. 30 tablet 1  . MONONESSA 0.25-35 MG-MCG  tablet Take 1 tablet by mouth once daily  4  . montelukast (SINGULAIR) 10 MG tablet Take 1 tablet (10 mg total) by mouth at bedtime. 30 tablet 6  . omeprazole (PRILOSEC) 20 MG capsule TAKE 1 CAPSULE BY MOUTH 2 TIMES DAILY BEFORE A MEAL 60 capsule 11  . oxyCODONE-acetaminophen (PERCOCET) 10-325 MG per tablet Take 1 tablet by mouth every 8 (eight) hours as needed for pain. Take 1 tablet by mouth every 8 hours as needed for severe pain. 15 tablet 0  . vitamin B-12 (CYANOCOBALAMIN) 1000 MCG tablet Take 1,000 mcg by mouth daily.    . Vitamin D, Ergocalciferol, (DRISDOL) 50000 units CAPS capsule Take 1 capsule (50,000 Units total) by mouth every 7 (seven) days. 4 capsule 3   No facility-administered medications prior to visit.     Allergies  Allergen Reactions  . Ibuprofen     Causes ulcers    ROS As per HPI  PE: There were no vitals taken for this visit. ***  LABS:    Chemistry      Component Value Date/Time   NA  138 05/01/2017 0835   K 4.5 05/01/2017 0835   CL 103 05/01/2017 0835   CO2 24 05/01/2017 0835   BUN 7 05/01/2017 0835   CREATININE 0.74 05/01/2017 0835      Component Value Date/Time   CALCIUM 9.4 05/01/2017 0835   ALKPHOS 70 11/01/2016 0928   AST 12 11/01/2016 0928   ALT 9 11/01/2016 0928   BILITOT 0.5 11/01/2016 0928     Lab Results  Component Value Date   CHOL 236 (H) 11/01/2016   HDL 76.90 11/01/2016   LDLCALC 126 (H) 11/01/2016   TRIG 166.0 (H) 11/01/2016   CHOLHDL 3 11/01/2016    IMPRESSION AND PLAN:  No problem-specific Assessment & Plan notes found for this encounter.   FOLLOW UP: No Follow-up on file.

## 2017-06-12 NOTE — Telephone Encounter (Signed)
A user error has taken place: encounter opened in error, closed for administrative reasons.

## 2017-06-12 NOTE — Telephone Encounter (Signed)
Pt advised and voiced understanding. She stated that she was told to finish out the antibiotics before she took the diflucan.

## 2017-06-12 NOTE — Telephone Encounter (Signed)
Reviewed urgent care records from 06/04/17: their urine culture grew bacteria that should be susceptible to the antibiotic she was given, but sometimes other antibiotics are better.  Continue with plan of taking the cipro as they prescribed. Also, it looks like they prescribed her diflucan (fluconazole) as well---for vaginal yeast infection.  Did she take this medication?

## 2017-06-12 NOTE — Telephone Encounter (Signed)
OK, this is fine. If urine culture from today returns showing no bacterial infection, then we'll have her stop the cipro and take the diflucan right away.-thx

## 2017-06-12 NOTE — Progress Notes (Signed)
OFFICE VISIT  06/12/2017   CC:  Chief Complaint  Patient presents with  . Follow-up    HTN  . Urinary Tract Infection    seen at ER last week, given antibiotic, still having symptoms   HPI:    Patient is a 41 y.o.  female who presents for 6 week f/u uncontrolled htn. Last visit I added Toprol xL 25mg  qd.  Home bp's reviewed today: 96-136 over 65-94---avg 120s over 90.  Onset 9 d/a, extreme vaginal and anal burning, no change in urinary frequency or urgency.  No lower abd discomfort or nausea.  No fever.  No burning WHILE urinating.  Some incomplete emptying feeling, some post-urinary discomfort in bladder/urethra area.  Took AZO but none in the last week.  Sees no vaginal or anal skin changes.  No vaginal d/c. Urine appears and smells normal to her. [facility] and dx'd with UTI, rx'd nitrofurantoin bid x 7d--finished this yesterday. Got a call back about "bacteria" and they wanted to rx cipro but she held off until seeing me.  Still having low glucoses when awakening, 60s-70s, with lightheaded and dizziness spells with this, resolves quickly with eating.   Discussed referral to endocrinologist today.  Past Medical History:  Diagnosis Date  . Allergy   . 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 Anxiety and depression    started with PPD  . Bacterial vaginitis   . Chronic neck pain    Myofascial pain syndrome per [facility], oxycodone per their office pain med contract.  (Normal cervical MRI 2010 per ortho notes).  . Dietary iron deficiency without anemia   . Easy bruisability 2011   Hx of: saw Hematologist (Dr. Cyndie ChimeGranfortuna) and w/u neg for von Willebrand desease.  Marland Kitchen. GERD (gastroesophageal reflux disease)    hx gastric ulcer and upper GI bleed, ? NSAID induced  . History of stomach ulcers   . Hypertension   . Migraine   . Seizure (HCC) 2015   s/p eval with neuro, no medication, no events since  . Syncope    with ? seizure in 2015, eval with neruo   . Vitamin B12 deficiency   . Vitamin D deficiency     Past Surgical History:  Procedure Laterality Date  . CESAREAN SECTION  2006  . ENDOMETRIAL BIOPSY    . EYE SURGERY Bilateral    2001  . GASTRIC BYPASS  2002  . TONSILLECTOMY AND ADENOIDECTOMY    . tubes in ears both Bilateral   . tubuligation  2006    Outpatient Medications Prior to Visit  Medication Sig Dispense Refill  . acetaminophen (TYLENOL) 500 MG tablet Take 1,000 mg by mouth every 6 (six) hours as needed for mild pain, moderate pain or headache.    . Alcohol Swabs (ALCOHOL WIPES) 70 % PADS Use to clean finger before checking blood sugar, checks blood sugar 3-4 times daily 100 each 11  . ALPRAZolam (XANAX) 0.5 MG tablet Take 1 tablet (0.5 mg total) by mouth daily as needed. 30 tablet 5  . amitriptyline (ELAVIL) 10 MG tablet Take 1 tablet (10 mg total) by mouth at bedtime. 30 tablet 11  . amitriptyline (ELAVIL) 25 MG tablet TAKE 1 TABLET BY MOUTH AT BEDTIME. 30 tablet 11  . BIOTIN PO Take 1 tablet by mouth daily.     . Calcium 250 MG CAPS Take 1 capsule by mouth daily.     . cetirizine (ZYRTEC) 10 MG tablet Take 1 tablet (10  mg total) by mouth daily. 30 tablet 11  . Cholecalciferol (VITAMIN D) 2000 units CAPS Take 1 capsule by mouth daily.    . DULoxetine (CYMBALTA) 60 MG capsule Take 1 capsule (60 mg total) by mouth daily. 30 capsule 6  . eletriptan (RELPAX) 20 MG tablet 1-2 tabs po at onset of migraine HA.  May repeat in 2 hours if headache persists or recurs.   Max dosing in 24 hours is 80 mg. 10 tablet 6  . ferrous sulfate 325 (65 FE) MG EC tablet Take 325 mg by mouth daily.    . fluticasone (FLONASE) 50 MCG/ACT nasal spray Place 2 sprays into both nostrils daily. 16 g 12  . hydrochlorothiazide (HYDRODIURIL) 25 MG tablet Take 1 tablet (25 mg total) by mouth daily. 30 tablet 1  . MONONESSA 0.25-35 MG-MCG tablet Take 1 tablet by mouth once daily  4  . montelukast (SINGULAIR) 10 MG tablet Take 1 tablet (10 mg total) by  mouth at bedtime. 30 tablet 6  . omeprazole (PRILOSEC) 20 MG capsule TAKE 1 CAPSULE BY MOUTH 2 TIMES DAILY BEFORE A MEAL 60 capsule 11  . oxyCODONE-acetaminophen (PERCOCET) 10-325 MG per tablet Take 1 tablet by mouth every 8 (eight) hours as needed for pain. Take 1 tablet by mouth every 8 hours as needed for severe pain. 15 tablet 0  . vitamin B-12 (CYANOCOBALAMIN) 1000 MCG tablet Take 1,000 mcg by mouth daily.    . Vitamin D, Ergocalciferol, (DRISDOL) 50000 units CAPS capsule Take 1 capsule (50,000 Units total) by mouth every 7 (seven) days. 4 capsule 3  . metoprolol succinate (TOPROL-XL) 25 MG 24 hr tablet Take 1 tablet (25 mg total) by mouth daily. 30 tablet 1   No facility-administered medications prior to visit.     Allergies  Allergen Reactions  . Ibuprofen     Causes ulcers    ROS As per HPI  PE: Blood pressure 96/65, pulse 79, temperature 98.2 F (36.8 C), temperature source Oral, resp. rate 16, height  (1.651 m), weight 248 lb 12 oz (112.8 kg), last menstrual period 06/03/2017, SpO2 100 %. Gen: Alert, well appearing.  Patient is oriented to person, place, time, and situation. AFFECT: pleasant, lucid thought and speech. No further exam today.  LABS:    Chemistry      Component Value Date/Time   NA 138 05/01/2017 0835   K 4.5 05/01/2017 0835   CL 103 05/01/2017 0835   CO2 24 05/01/2017 0835   BUN 7 05/01/2017 0835   CREATININE 0.74 05/01/2017 0835      Component Value Date/Time   CALCIUM 9.4 05/01/2017 0835   ALKPHOS 70 11/01/2016 0928   AST 12 11/01/2016 0928   ALT 9 11/01/2016 0928   BILITOT 0.5 11/01/2016 0928     CC UA today: SG > 1.030, 30 mg/dl protein, otherwise normal.  IMPRESSION AND PLAN:  1) Hypertension: much improved control.  The current medical regimen is effective;  continue present plan and medications.  2) GU complaints; ? UTI as per UC last week.  Sounds like macrobid taken but UC called her back and told her to switch to cipro.   She'll start cipro rx'd by UC.  Will get UC records to clarify. Sent urine for c/s today. May have her return to check urine GC/Chlamydia and possibly get likely get wet prep.  3) Recurrent hypoglycemia; unknown etiology. Referral to endocrinology today.  An After Visit Summary was printed and given to the patient.  FOLLOW  UP: Return in about 4 months (around 10/12/2017) for routine chronic illness f/u.  Signed:  Santiago Bumpers, MD           06/12/2017

## 2017-06-14 LAB — URINE CULTURE
MICRO NUMBER:: 80983095
Result:: NO GROWTH
SPECIMEN QUALITY: ADEQUATE

## 2017-06-16 ENCOUNTER — Encounter: Payer: Self-pay | Admitting: *Deleted

## 2017-06-18 ENCOUNTER — Encounter: Payer: Self-pay | Admitting: Family Medicine

## 2017-06-19 MED ORDER — METRONIDAZOLE 500 MG PO TABS: 500.0000 mg | ORAL_TABLET | Freq: Two times a day (BID) | ORAL | 0 refills | Status: AC

## 2017-06-19 MED ORDER — FLUCONAZOLE 150 MG PO TABS: ORAL_TABLET | ORAL | 0 refills | Status: DC

## 2017-06-19 MED FILL — FLUCONAZOLE 150 MG TABLET: 150 | 3 days supply | Qty: 3 | Fill #0

## 2017-06-19 MED FILL — metroNIDAZOLE 500 MG TABS: 500 | 7 days supply | Qty: 14 | Fill #0

## 2017-06-19 NOTE — Telephone Encounter (Signed)
Please advise. Thanks.  

## 2017-06-19 NOTE — Telephone Encounter (Signed)
Flagyl and diflucan both eRx'd per pt request.

## 2017-06-20 DIAGNOSIS — N898 Other specified noninflammatory disorders of vagina: Secondary | ICD-10-CM | POA: Diagnosis not present

## 2017-06-20 DIAGNOSIS — R399 Unspecified symptoms and signs involving the genitourinary system: Secondary | ICD-10-CM | POA: Diagnosis not present

## 2017-06-20 MED FILL — CLOTRIMAZOLE-BETAMETHASONE: 1-0.05 | 14 days supply | Qty: 45 | Fill #0

## 2017-06-20 MED FILL — TERCONAZOLE 80 MG SUPP: 80 | 3 days supply | Qty: 3 | Fill #0

## 2017-07-08 ENCOUNTER — Other Ambulatory Visit: Payer: Self-pay | Admitting: *Deleted

## 2017-07-08 ENCOUNTER — Encounter: Payer: Self-pay | Admitting: Family Medicine

## 2017-07-08 ENCOUNTER — Other Ambulatory Visit: Payer: Self-pay | Admitting: Family Medicine

## 2017-07-08 MED ORDER — DULOXETINE HCL 30 MG PO CPEP: ORAL_CAPSULE | ORAL | 0 refills | Status: DC

## 2017-07-08 MED ORDER — GLUCOSE BLOOD VI STRP: ORAL_STRIP | 12 refills | Status: DC

## 2017-07-08 MED FILL — OMEPRAZOLE 20 MG CAP: 20 | 30 days supply | Qty: 60 | Fill #4

## 2017-07-08 MED FILL — ALPRAZolam 0.5 MG TABS: 0.5 | 30 days supply | Qty: 30 | Fill #4

## 2017-07-08 MED FILL — METOPROLOL SUCC ER 25 MG TA: 25 | 90 days supply | Qty: 90 | Fill #0

## 2017-07-08 MED FILL — HYDROCHLOROTHIAZIDE 25 MG T: 25 | 30 days supply | Qty: 30 | Fill #0

## 2017-07-08 MED FILL — FREESTYLE LITE TEST STRIP: 60 days supply | Qty: 200 | Fill #0

## 2017-07-08 NOTE — Telephone Encounter (Signed)
She has amitriptyline  and  tabs. Take a  tab at bedtime x 7d, then drop to a 10 mg tab at bedtime x 7d, then stop.  She has duloxetine (cymbalta)  caps. I recommend she ween by first dropping down to  caps:  (pls eRx duloxetine , 1 cap qd x 10d, then 1 cap qod x 10d days then stop.  Disp # 15, no RF.)--thx

## 2017-07-08 NOTE — Telephone Encounter (Signed)
Please advise. Thanks.  

## 2017-07-08 NOTE — Telephone Encounter (Signed)
Pt wants to know how to wean of these medications, please advise. Thanks.

## 2017-07-08 NOTE — Telephone Encounter (Signed)
I am sorry about the cost of medications, but discontinuing these meds and managing her anxiety and insomnia by increasing her xanax is not an option.

## 2017-07-10 DIAGNOSIS — G894 Chronic pain syndrome: Secondary | ICD-10-CM | POA: Diagnosis not present

## 2017-07-10 DIAGNOSIS — M542 Cervicalgia: Secondary | ICD-10-CM | POA: Diagnosis not present

## 2017-07-18 ENCOUNTER — Ambulatory Visit: Payer: 59

## 2017-07-18 ENCOUNTER — Ambulatory Visit (INDEPENDENT_AMBULATORY_CARE_PROVIDER_SITE_OTHER): Payer: 59

## 2017-07-18 DIAGNOSIS — Z23 Encounter for immunization: Secondary | ICD-10-CM

## 2017-07-19 NOTE — Telephone Encounter (Signed)
-----   Message from Smitty Knudsen, [place] sent at 07/18/2017  3:14 PM EDT ----- Regarding: B12 Injection Hey, Dr. Milinda Cave. I need to put in orders for pts B12 injections. How often and for how long should pt get B12 injections? Please let me know and I will put in the order and documentation. Thanks.

## 2017-07-19 NOTE — Telephone Encounter (Signed)
Go ahead and enter vit B12 1000 mcg IM q 2 months. We'll see how this goes and may have to adjust.-thx

## 2017-07-21 ENCOUNTER — Other Ambulatory Visit: Payer: Self-pay | Admitting: *Deleted

## 2017-07-21 ENCOUNTER — Telehealth: Payer: Self-pay | Admitting: *Deleted

## 2017-07-21 DIAGNOSIS — E538 Deficiency of other specified B group vitamins: Secondary | ICD-10-CM

## 2017-07-21 MED ORDER — CYANOCOBALAMIN 1000 MCG/ML IJ SOLN
1000.0000 ug | INTRAMUSCULAR | Status: DC
  Administered 2018-02-06: 1000 ug via INTRAMUSCULAR

## 2017-07-21 NOTE — Telephone Encounter (Signed)
Will start new phone note since this was attached to the wrong note.

## 2017-07-21 NOTE — Telephone Encounter (Signed)
Jeoffrey Massed, MD at 07/19/2017 1:27 PM   Status: Signed    Go ahead and enter vit B12 1000 mcg IM q 2 months. We'll see how this goes and may have to adjust.-thx    Jeoffrey Massed, MD at 07/19/2017 1:27 PM   Status: Signed    ----- Message from Smitty Knudsen, CMA sent at 07/18/2017  3:14 PM EDT ----- Regarding: B12 Injection Hey, Dr. Milinda Cave. I need to put in orders for pts B12 injections. How often and for how long should pt get B12 injections? Please let me know and I will put in the order and documentation. Thanks.

## 2017-07-21 NOTE — Telephone Encounter (Signed)
I SW Dr. Milinda Cave to confirm how often pt is to come in. Pt stated that she gets her B12 every 6 months. Per Dr. Milinda Cave okay to continue getting B12 every 6 months and put standing order in for #6.   Standing order for B12 entered into pts chart. B12 injection to be given every 6 months for 6 injections.

## 2017-07-22 DIAGNOSIS — G894 Chronic pain syndrome: Secondary | ICD-10-CM | POA: Diagnosis not present

## 2017-07-22 DIAGNOSIS — F4542 Pain disorder with related psychological factors: Secondary | ICD-10-CM | POA: Diagnosis not present

## 2017-07-22 DIAGNOSIS — G609 Hereditary and idiopathic neuropathy, unspecified: Secondary | ICD-10-CM | POA: Diagnosis not present

## 2017-07-22 DIAGNOSIS — M792 Neuralgia and neuritis, unspecified: Secondary | ICD-10-CM | POA: Diagnosis not present

## 2017-07-24 ENCOUNTER — Ambulatory Visit: Payer: 59 | Admitting: Endocrinology

## 2017-08-07 ENCOUNTER — Encounter: Payer: Self-pay | Admitting: Endocrinology

## 2017-08-07 ENCOUNTER — Ambulatory Visit (INDEPENDENT_AMBULATORY_CARE_PROVIDER_SITE_OTHER): Payer: 59 | Admitting: Endocrinology

## 2017-08-07 VITALS — BP 132/84 | HR 62 | Ht 65.0 in | Wt 248.0 lb

## 2017-08-07 DIAGNOSIS — E162 Hypoglycemia, unspecified: Secondary | ICD-10-CM | POA: Diagnosis not present

## 2017-08-07 DIAGNOSIS — E538 Deficiency of other specified B group vitamins: Secondary | ICD-10-CM | POA: Diagnosis not present

## 2017-08-07 LAB — POCT GLYCOSYLATED HEMOGLOBIN (HGB A1C): Hemoglobin A1C: 4.6

## 2017-08-07 LAB — CORTISOL
Cortisol, Plasma: 6.4 ug/dL
Cortisol, Plasma: 28.4 ug/dL

## 2017-08-07 MED ORDER — CYANOCOBALAMIN 1000 MCG/ML IJ SOLN
1000.0000 ug | Freq: Once | INTRAMUSCULAR | Status: AC
  Administered 2017-08-07: 1000 ug via INTRAMUSCULAR

## 2017-08-07 MED ORDER — COSYNTROPIN NICU IV SYRINGE 0.25 MG/ML (STANDARD DOSE)
0.2500 mg | Freq: Once | INTRAVENOUS | Status: AC
  Administered 2017-08-07: 0.25 mg via INTRAMUSCULAR

## 2017-08-07 MED FILL — ALPRAZolam 0.5 MG TABS: 0.5 | 30 days supply | Qty: 30 | Fill #5

## 2017-08-07 NOTE — Patient Instructions (Signed)
blood tests are requested for you today.  We'll let you know about the results.   The next step is to fast after dinner once day, then spend the entire next day here at the office, without eating (however, diet drinks are fine, if you want to bring).  Please let us know what day you want to come in.

## 2017-08-07 NOTE — Progress Notes (Signed)
Subjective:    Patient ID: Toni [name], female    DOB: [birth year], 41 y.o.   MRN: [id number]  HPI Pt is referred by Dr Milinda Cave, for hypoglycemia.  Pt reports 3 years of intermittent sxs of moderate dizziness sensation in the head.  On 1 occasion in 2015, she had assoc seizure (when she lived in [place]).  Neurologist wasn't certain if this was related to hypoglycemia.  This usually happens approx 1-2 hrs after eating, but can also happen many hours later. However, it does not happen in the middle of the night.  During the sxs, she has checked cbg (varies from 40-60).  Symptoms are incompletely relieved by eating or drinking.  She takes no medication for the blood sugar.  She has never had excessive alcohol intake, adrenal problems, pituitary problems, liver problems, or kidney problems.  She had gastric bypass surgery in 2002.  She hs had TL.   Past Medical History:  Diagnosis Date  . Allergy   . 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 Anxiety and depression    started with PPD  . Bacterial vaginitis   . Chronic neck pain    Myofascial pain syndrome per [facility], oxycodone per their office pain med contract.  (Normal cervical MRI 2010 per ortho notes).  . Dietary iron deficiency without anemia   . Easy bruisability 2011   Hx of: saw Hematologist (Dr. Cyndie Chime) and w/u neg for von Willebrand desease.  Marland Kitchen GERD (gastroesophageal reflux disease)    hx gastric ulcer and upper GI bleed, ? NSAID induced  . History of stomach ulcers   . Hypertension   . Migraine   . Seizure (HCC) 2015   s/p eval with neuro, no medication, no events since  . Syncope    with ? seizure in 2015, eval with neruo  . Vitamin B12 deficiency   . Vitamin D deficiency     Past Surgical History:  Procedure Laterality Date  . CESAREAN SECTION  2006  . ENDOMETRIAL BIOPSY    . EYE SURGERY Bilateral    2001  . GASTRIC BYPASS  2002  . TONSILLECTOMY AND ADENOIDECTOMY    . tubes in ears both Bilateral    . tubuligation  2006    Social History   Social History  . Marital status: Married    Spouse name: N/A  . Number of children: N/A  . Years of education: N/A   Occupational History  . Not on file.   Social History Main Topics  . Smoking status: Never Smoker  . Smokeless tobacco: Never Used  . Alcohol use 0.0 oz/week     Comment: occasional   . Drug use: No  . Sexual activity: Not on file   Other Topics Concern  . Not on file   Social History Narrative   Married.   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   No T/A/Ds.    Current Outpatient Prescriptions on File Prior to Visit  Medication Sig Dispense Refill  . acetaminophen (TYLENOL) 500 MG tablet Take 1,000 mg by mouth every 6 (six) hours as needed for mild pain, moderate pain or headache.    . Alcohol Swabs (ALCOHOL WIPES) 70 % PADS Use to clean finger before checking blood sugar, checks blood sugar 3-4 times daily 100 each 11  . ALPRAZolam (XANAX) 0.5 MG tablet Take 1 tablet (0.5 mg total) by mouth daily as needed. 30 tablet 5  . BIOTIN PO Take 1 tablet by mouth daily.     Marland Kitchen  Calcium 250 MG CAPS Take 1 capsule by mouth daily.     . cetirizine (ZYRTEC) 10 MG tablet Take 1 tablet (10 mg total) by mouth daily. 30 tablet 11  . Cholecalciferol (VITAMIN D) 2000 units CAPS Take 1 capsule by mouth daily.    Marland Kitchen eletriptan (RELPAX) 20 MG tablet 1-2 tabs po at onset of migraine HA.  May repeat in 2 hours if headache persists or recurs.   Max dosing in 24 hours is 80 mg. 10 tablet 6  . ferrous sulfate 325 (65 FE) MG EC tablet Take 325 mg by mouth daily.    . fluticasone (FLONASE) 50 MCG/ACT nasal spray Place 2 sprays into both nostrils daily. 16 g 12  . glucose blood test strip Use to check blood sugar three times daily. 100 each 12  . hydrochlorothiazide (HYDRODIURIL) 25 MG tablet TAKE 1 TABLET BY MOUTH DAILY. 30 tablet 5  . metoprolol succinate (TOPROL-XL) 25 MG 24 hr tablet Take 1 tablet (25 mg total) by mouth daily. 30 tablet  6  . MONONESSA 0.25-35 MG-MCG tablet Take 1 tablet by mouth once daily  4  . montelukast (SINGULAIR) 10 MG tablet Take 1 tablet (10 mg total) by mouth at bedtime. 30 tablet 6  . omeprazole (PRILOSEC) 20 MG capsule TAKE 1 CAPSULE BY MOUTH 2 TIMES DAILY BEFORE A MEAL 60 capsule 11  . oxyCODONE-acetaminophen (PERCOCET) 10-325 MG per tablet Take 1 tablet by mouth every 8 (eight) hours as needed for pain. Take 1 tablet by mouth every 8 hours as needed for severe pain. 15 tablet 0  . vitamin B-12 (CYANOCOBALAMIN) 1000 MCG tablet Take 1,000 mcg by mouth daily.     Current Facility-Administered Medications on File Prior to Visit  Medication Dose Route Frequency Provider Last Rate Last Dose  . cyanocobalamin ((VITAMIN B-12)) injection 1,000 mcg  1,000 mcg Intramuscular Q6 months McGowen, Maryjean Morn, MD        Allergies  Allergen Reactions  . Ibuprofen     Causes ulcers    Family History  Problem Relation Age of Onset  . Arthritis Mother   . Hyperlipidemia Mother   . Heart disease Mother   . Diabetes Mother   . Asthma Mother   . Aneurysm Father        deceased  . Lung cancer Maternal Aunt        smoker  . Colon cancer Maternal Grandmother   . Lung cancer Maternal Aunt        smoker    BP 132/84   Pulse 62   Ht 5\' 5"  (1.651 m)   Wt 248 lb (112.5 kg)   SpO2 98%   BMI 41.27 kg/m     Review of Systems denies recent weight change, fever, diarrhea, palpitations, rash, visual loss, sob, arthralgias, galactorrhea, excessive diaphoresis, muscle weakness, n/v, rhinorrhea, numbness, or LOC.  She has many years of fatigue, anxiety, intermitt abd camps, nocturia, heavy menses, cold intolerance, easy bruising, and intermitt headache.      Objective:   Physical Exam VS: see vs page GEN: no distress HEAD: head: no deformity eyes: no periorbital swelling, no proptosis external nose and ears are normal mouth: no lesion seen NECK: supple, thyroid is not enlarged CHEST WALL: no  deformity LUNGS: clear to auscultation CV: reg rate and rhythm, no murmur ABD: abdomen is soft, nontender.  no hepatosplenomegaly.  not distended.  no hernia MUSCULOSKELETAL: muscle bulk and strength are grossly normal.  no obvious joint swelling.  gait  is normal and steady EXTEMITIES: no deformity.  no ulcer on the feet.  feet are of normal color and temp.  no edema PULSES: dorsalis pedis intact bilat.  no carotid bruit NEURO:  cn 2-12 grossly intact.   readily moves all 4's.  sensation is intact to touch on the feet SKIN:  Normal texture and temperature.  No rash or suspicious lesion is visible.   NODES:  None palpable at the neck PSYCH: alert, well-oriented.  Does not appear anxious nor depressed.    Lab Results  Component Value Date   HGBA1C 4.6 08/07/2017   I have reviewed outside records, and summarized: Pt was noted to have elevated a1c, and referred here.  Other probs addressed were HTN and UTI  Lab Results  Component Value Date   ALT 9 11/01/2016   AST 12 11/01/2016   ALKPHOS 70 11/01/2016   BILITOT 0.5 11/01/2016   Lab Results  Component Value Date   CREATININE 0.74 05/01/2017   BUN 7 05/01/2017   NA 138 05/01/2017   K 4.5 05/01/2017   CL 103 05/01/2017   CO2 24 05/01/2017      Assessment & Plan:  Hypoglycemia, new to me.  Seems primarily reactive, especially with history of gastric bypass surgery.   Seizure: prob not related to hypoglycemia.  However, we should exclude fasting hypoglycemia.   Patient Instructions  blood tests are requested for you today.  We'll let you know about the results.   The next step is to fast after dinner once day, then spend the entire next day here at the office, without eating (however, diet drinks are fine, if you want to bring).  Please let us know what day you want to come in.

## 2017-08-18 MED FILL — OMEPRAZOLE 20 MG CAP: 20 | 30 days supply | Qty: 60 | Fill #5

## 2017-08-18 MED FILL — NORG-ETHIN ESTRA 0.25-0.035: 0.25-35 | 84 days supply | Qty: 84 | Fill #2

## 2017-09-01 ENCOUNTER — Ambulatory Visit (INDEPENDENT_AMBULATORY_CARE_PROVIDER_SITE_OTHER): Payer: 59 | Admitting: Endocrinology

## 2017-09-01 ENCOUNTER — Other Ambulatory Visit (INDEPENDENT_AMBULATORY_CARE_PROVIDER_SITE_OTHER): Payer: 59

## 2017-09-01 ENCOUNTER — Encounter: Payer: Self-pay | Admitting: Endocrinology

## 2017-09-01 VITALS — BP 132/82 | HR 94 | Wt 253.8 lb

## 2017-09-01 DIAGNOSIS — E162 Hypoglycemia, unspecified: Secondary | ICD-10-CM

## 2017-09-01 LAB — BASIC METABOLIC PANEL
BUN: 12 mg/dL (ref 6–23)
CO2: 25 meq/L (ref 19–32)
Calcium: 9.3 mg/dL (ref 8.4–10.5)
Chloride: 102 mEq/L (ref 96–112)
Creatinine, Ser: 0.78 mg/dL (ref 0.40–1.20)
GFR: 104.23 mL/min (ref >60.00)
GLUCOSE: 84 mg/dL (ref 70–99)
POTASSIUM: 4.1 meq/L (ref 3.5–5.1)
Sodium: 136 mEq/L (ref 135–145)

## 2017-09-01 LAB — GLUCOSE, POCT (MANUAL RESULT ENTRY)
POC GLUCOSE: 107 mg/dL — AB (ref 70–99)
POC Glucose: 72 mg/dl (ref 70–99)
POC Glucose: 60 mg/dl — AB (ref 70–99)

## 2017-09-01 NOTE — Patient Instructions (Signed)
blood tests are requested for you today.  We'll let you know about the results.  

## 2017-09-01 NOTE — Progress Notes (Signed)
   Subjective:    Patient ID: Toni [name], female    DOB: 1976/03/15, 41 y.o.   MRN: [id number]  HPI Pt comes today for monitored fasting.  She feels well throughout the day, until she feels poorly at 4 PM.  cbg is 60 then.  Last intake other than water was 6 PM yesterday   Review of Systems No LOC    Objective:   Physical Exam VITAL SIGNS:  See vs page GENERAL: no distress Gait: normal and steady.      Assessment & Plan:  Hypoglycemia, by report.  This cbg is approx average for a 22 hr fast.    Patient Instructions  blood tests are requested for you today.  We'll let you know about the results.

## 2017-09-02 LAB — C-PEPTIDE: C-Peptide: 1.37 ng/mL (ref 0.80–3.85)

## 2017-09-02 LAB — INSULIN, RANDOM: Insulin: 6.1 u[IU]/mL (ref 2.0–19.6)

## 2017-09-03 ENCOUNTER — Ambulatory Visit (INDEPENDENT_AMBULATORY_CARE_PROVIDER_SITE_OTHER): Payer: 59

## 2017-09-03 DIAGNOSIS — Z111 Encounter for screening for respiratory tuberculosis: Secondary | ICD-10-CM

## 2017-09-03 NOTE — Progress Notes (Signed)
Patient presents today for TB test for new part time position. TB placed on left forearm with no incidence or complications. Patient to return Friday for reading.

## 2017-09-05 LAB — TB SKIN TEST
Induration: 0 mm
TB Skin Test: NEGATIVE

## 2017-09-06 ENCOUNTER — Encounter: Payer: Self-pay | Admitting: Family Medicine

## 2017-09-09 ENCOUNTER — Encounter: Payer: Self-pay | Admitting: Endocrinology

## 2017-09-18 ENCOUNTER — Encounter: Payer: Self-pay | Admitting: Family Medicine

## 2017-09-18 ENCOUNTER — Telehealth: Payer: Self-pay | Admitting: Family Medicine

## 2017-09-18 NOTE — Telephone Encounter (Signed)
See mychart message from pt. Waiting on new FMLA form.

## 2017-09-18 NOTE — Telephone Encounter (Signed)
FYI. New FMLA form will be coming in for pt.

## 2017-09-18 NOTE — Telephone Encounter (Signed)
Patient contacted office and needs her FMLA paper work to be dated December of 2018 in order for it to be used in 2019.  Patient states all information is the same on the form just the date of Dr Milinda CaveMcGowen signing need to be changed to December.  I asked patient to send new forms because I didn't know if he would be able to " just change the date "  Patient agreed.  I also printed August FMLA forms [facility], [facility].

## 2017-09-23 ENCOUNTER — Encounter: Payer: Self-pay | Admitting: Family Medicine

## 2017-09-23 ENCOUNTER — Other Ambulatory Visit: Payer: Self-pay | Admitting: Family Medicine

## 2017-09-23 NOTE — Telephone Encounter (Signed)
Please advise. Thanks.  

## 2017-09-23 NOTE — Telephone Encounter (Signed)
See MyChart message from pt.

## 2017-09-24 MED ORDER — ALPRAZOLAM 1 MG PO TABS: ORAL_TABLET | ORAL | 5 refills | Status: DC

## 2017-09-24 MED FILL — ALPRAZolam 1 MG TABS: 1 | 30 days supply | Qty: 30 | Fill #0

## 2017-09-24 NOTE — Telephone Encounter (Signed)
I did the 1mg  alprazolam--see mychart note. I'll decline RF of the 0.5 alpraz.

## 2017-09-24 NOTE — Telephone Encounter (Signed)
OK, rx'd alprazolam at 1mg  dose.

## 2017-09-24 NOTE — Telephone Encounter (Signed)
Pt advised via mychart, Rx faxed.

## 2017-09-30 ENCOUNTER — Encounter: Payer: Self-pay | Admitting: Family Medicine

## 2017-10-02 NOTE — Telephone Encounter (Signed)
I did her FMLA paperwork for 2019. She'll need to direct any specific questions about low glucose prevention to Dr. Everardo AllEllison (her endocrinologist)-thx

## 2017-10-02 NOTE — Telephone Encounter (Signed)
FMLA form has been completed and faxed. I will advise pt.   Please see second part of mychart message regarding blood sugars.. Thanks.

## 2017-10-24 MED FILL — ALPRAZolam 1 MG TABS: 1 | 30 days supply | Qty: 30 | Fill #1

## 2017-10-30 ENCOUNTER — Encounter: Payer: Self-pay | Admitting: Family Medicine

## 2017-10-30 ENCOUNTER — Ambulatory Visit: Payer: 59 | Admitting: Family Medicine

## 2017-10-30 VITALS — BP 140/98 | HR 69 | Temp 98.2°F | Resp 16 | Wt 249.0 lb

## 2017-10-30 DIAGNOSIS — F5105 Insomnia due to other mental disorder: Secondary | ICD-10-CM

## 2017-10-30 DIAGNOSIS — F411 Generalized anxiety disorder: Secondary | ICD-10-CM | POA: Diagnosis not present

## 2017-10-30 DIAGNOSIS — K9089 Other intestinal malabsorption: Secondary | ICD-10-CM

## 2017-10-30 DIAGNOSIS — E538 Deficiency of other specified B group vitamins: Secondary | ICD-10-CM

## 2017-10-30 DIAGNOSIS — E559 Vitamin D deficiency, unspecified: Secondary | ICD-10-CM | POA: Diagnosis not present

## 2017-10-30 DIAGNOSIS — F330 Major depressive disorder, recurrent, mild: Secondary | ICD-10-CM

## 2017-10-30 LAB — CBC WITH DIFFERENTIAL/PLATELET
BASOS PCT: 0.6 % (ref 0.0–3.0)
Basophils Absolute: 0 10*3/uL (ref 0.0–0.1)
EOS PCT: 0.5 % (ref 0.0–5.0)
Eosinophils Absolute: 0 10*3/uL (ref 0.0–0.7)
HCT: 42.2 % (ref 36.0–46.0)
Hemoglobin: 14.2 g/dL (ref 12.0–15.0)
LYMPHS PCT: 37.1 % (ref 12.0–46.0)
Lymphs Abs: 2 10*3/uL (ref 0.7–4.0)
MCHC: 33.7 g/dL (ref 30.0–36.0)
MCV: 91.2 fl (ref 78.0–100.0)
MONOS PCT: 5.7 % (ref 3.0–12.0)
Monocytes Absolute: 0.3 10*3/uL (ref 0.1–1.0)
NEUTROS ABS: 3 10*3/uL (ref 1.4–7.7)
Neutrophils Relative %: 56.1 % (ref 43.0–77.0)
PLATELETS: 302 10*3/uL (ref 150.0–400.0)
RBC: 4.63 Mil/uL (ref 3.87–5.11)
RDW: 13 % (ref 11.5–15.5)
WBC: 5.3 10*3/uL (ref 4.0–10.5)

## 2017-10-30 LAB — VITAMIN D 25 HYDROXY (VIT D DEFICIENCY, FRACTURES): VITD: 33.37 ng/mL (ref 30.00–100.00)

## 2017-10-30 LAB — FERRITIN: Ferritin: 42.2 ng/mL (ref 10.0–291.0)

## 2017-10-30 LAB — IRON: IRON: 162 ug/dL — AB (ref 42–145)

## 2017-10-30 LAB — TSH: TSH: 1.26 u[IU]/mL (ref 0.35–4.50)

## 2017-10-30 LAB — VITAMIN B12: Vitamin B-12: 346 pg/mL (ref 211–911)

## 2017-10-30 MED ORDER — DULOXETINE HCL 30 MG PO CPEP: 30.0000 mg | ORAL_CAPSULE | Freq: Every day | ORAL | 0 refills | Status: DC

## 2017-10-30 MED ORDER — CLONIDINE HCL 0.1 MG PO TABS: ORAL_TABLET | ORAL | 0 refills | Status: DC

## 2017-10-30 MED FILL — DULoxetine HCL 30 MG CPEP: 30 | 30 days supply | Qty: 30 | Fill #0

## 2017-10-30 MED FILL — CloNIDine HCL 0.1 MG TAB: 0.1 | 30 days supply | Qty: 30 | Fill #0

## 2017-10-30 NOTE — Progress Notes (Signed)
OFFICE VISIT  10/30/2017   CC:  Chief Complaint  Patient presents with  . Depression    follow up / anxiety   HPI:    Patient is a 42 y.o.  female who presents for anxiety and depression.  Feeling depressed, esp since marital problems lately, worse at night. Has early awakenings, ruminates.  Interested in restarting cymbalta.  Initiates sleep fine.  Appetite down, energy level down. Not much irritability.  No SI or HI.  Anhedonia.  Still going to work fine. No self medicated.  Takes alprazolam during daytime. [facility]--"she's been great".  OTC sleep aids no help.  BP a little higher last couple days b/c not taking meds like she should. She says bps are usually normal at home, doesn't recall any HR's.  Past Medical History:  Diagnosis Date  . Allergy   . 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 Anxiety and depression    started with PPD  . Bacterial vaginitis   . Chronic neck pain    Myofascial pain syndrome per [facility] [facility], oxycodone per their office pain med contract.  (Normal cervical MRI 2010 per ortho notes).  . Dietary iron deficiency without anemia   . Easy bruisability 2011   Hx of: saw Hematologist (Dr. Cyndie Chime) and w/u neg for von Willebrand desease.  Marland Kitchen GERD (gastroesophageal reflux disease)    hx gastric ulcer and upper GI bleed, ? NSAID induced  . History of stomach ulcers   . Hypertension   . Migraine   . Reactive hypoglycemia    Dr. Everardo All  . Seizure (HCC) 2015   s/p eval with neuro, no medication, no events since  . Syncope    with ? seizure in 2015, eval with neruo  . Vitamin B12 deficiency   . Vitamin D deficiency     Past Surgical History:  Procedure Laterality Date  . CESAREAN SECTION  2006  . ENDOMETRIAL BIOPSY    . EYE SURGERY Bilateral    2001  . GASTRIC BYPASS  2002  . TONSILLECTOMY AND ADENOIDECTOMY    . tubes in ears both Bilateral   . tubuligation  2006    Outpatient Medications  Prior to Visit  Medication Sig Dispense Refill  . acetaminophen (TYLENOL) 500 MG tablet Take 1,000 mg by mouth every 6 (six) hours as needed for mild pain, moderate pain or headache.    . Alcohol Swabs (ALCOHOL WIPES) 70 % PADS Use to clean finger before checking blood sugar, checks blood sugar 3-4 times daily 100 each 11  . ALPRAZolam (XANAX) 1 MG tablet 1/2-1 tab po qd prn 30 tablet 5  . BIOTIN PO Take 1 tablet by mouth daily.     . Calcium 250 MG CAPS Take 1 capsule by mouth daily.     . cetirizine (ZYRTEC) 10 MG tablet Take 1 tablet (10 mg total) by mouth daily. 30 tablet 11  . Cholecalciferol (VITAMIN D) 2000 units CAPS Take 1 capsule by mouth daily.    Marland Kitchen eletriptan (RELPAX) 20 MG tablet 1-2 tabs po at onset of migraine HA.  May repeat in 2 hours if headache persists or recurs.   Max dosing in 24 hours is 80 mg. 10 tablet 6  . ferrous sulfate 325 (65 FE) MG EC tablet Take 325 mg by mouth daily.    . fluticasone (FLONASE) 50 MCG/ACT nasal spray Place 2 sprays into both nostrils daily. 16 g 12  . glucose blood test strip  Use to check blood sugar three times daily. 100 each 12  . hydrochlorothiazide (HYDRODIURIL) 25 MG tablet TAKE 1 TABLET BY MOUTH DAILY. 30 tablet 5  . metoprolol succinate (TOPROL-XL) 25 MG 24 hr tablet Take 1 tablet (25 mg total) by mouth daily. 30 tablet 6  . MONONESSA 0.25-35 MG-MCG tablet Take 1 tablet by mouth once daily  4  . montelukast (SINGULAIR) 10 MG tablet Take 1 tablet (10 mg total) by mouth at bedtime. 30 tablet 6  . omeprazole (PRILOSEC) 20 MG capsule TAKE 1 CAPSULE BY MOUTH 2 TIMES DAILY BEFORE A MEAL 60 capsule 11  . oxyCODONE-acetaminophen (PERCOCET) 10-325 MG per tablet Take 1 tablet by mouth every 8 (eight) hours as needed for pain. Take 1 tablet by mouth every 8 hours as needed for severe pain. 15 tablet 0  . vitamin B-12 (CYANOCOBALAMIN) 1000 MCG tablet Take 1,000 mcg by mouth daily.     Facility-Administered Medications Prior to Visit  Medication Dose  Route Frequency Provider Last Rate Last Dose  . cyanocobalamin ((VITAMIN B-12)) injection 1,000 mcg  1,000 mcg Intramuscular Q6 months Dalissa Lovin, Maryjean Morn, MD        Allergies  Allergen Reactions  . Ibuprofen     Causes ulcers    ROS As per HPI  PE: Blood pressure (!) 140/98, pulse 69, temperature 98.2 F (36.8 C), temperature source Oral, resp. rate 16, weight 249 lb (112.9 kg), SpO2 97 %.  Wt Readings from Last 2 Encounters:  10/30/17 249 lb (112.9 kg)  09/01/17 253 lb 12.8 oz (115.1 kg)    Gen: alert, oriented x 4, affect pleasant.  Lucid thinking and conversation noted. HEENT: PERRLA, EOMI.   Neck: no LAD, mass, or thyromegaly. CV: RRR, no m/r/g LUNGS: CTA bilat, nonlabored. NEURO: no tremor or tics noted on observation.  Coordination intact. CN 2-12 grossly intact bilaterally, strength 5/5 in all extremeties.  No ataxia.   LABS:    Chemistry      Component Value Date/Time   NA 136 09/01/2017 1555   K 4.1 09/01/2017 1555   CL 102 09/01/2017 1555   CO2 25 09/01/2017 1555   BUN 12 09/01/2017 1555   CREATININE 0.78 09/01/2017 1555      Component Value Date/Time   CALCIUM 9.3 09/01/2017 1555   ALKPHOS 70 11/01/2016 0928   AST 12 11/01/2016 0928   ALT 9 11/01/2016 0928   BILITOT 0.5 11/01/2016 0928     Vit D 02/27/17 = 46  Lab Results  Component Value Date   TSH 1.56 11/01/2016    Lab Results  Component Value Date   WBC 4.8 01/17/2017   HGB 14.6 01/17/2017   HCT 44.7 01/17/2017   MCV 92.4 01/17/2017   PLT 305.0 01/17/2017   Lab Results  Component Value Date   VITAMINB12 640 01/17/2017   Lab Results  Component Value Date   IRON 88 01/17/2017   IRON 100 01/17/2017   TIBC 417 01/17/2017   FERRITIN 16.4 01/17/2017    IMPRESSION AND PLAN:  1) MDD, recurrent. Marital issues the main trigger.  Continue counseling.  Check TSH. Restart duloxetine 30mg  qd and recheck in 3 weeks to see if up titration is needed (she had been on 60mg  qd dose in the  past).  No change to xanax, esp since she also takes narcotic pain med.  2) Insomnia, mostly a symptom of her depression. Will do trial of clonidine 0.1mg  qhs, titrate up in 3 wks if needed and if bp/hr  tolerating it.  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  An After Visit Summary was printed and given to the patient.  FOLLOW UP: Return in about 3 weeks (around 11/20/2017) for f/u depression.  Signed:  Santiago BumpersPhil Marin Milley, MD           10/30/2017

## 2017-10-31 DIAGNOSIS — Z79891 Long term (current) use of opiate analgesic: Secondary | ICD-10-CM | POA: Insufficient documentation

## 2017-10-31 LAB — IRON AND TIBC
Iron Saturation: 39 % (ref 15–55)
Iron: 150 ug/dL (ref 27–159)
Total Iron Binding Capacity: 388 ug/dL (ref 250–450)
UIBC: 238 ug/dL (ref 131–425)

## 2017-11-21 ENCOUNTER — Telehealth: Payer: Self-pay | Admitting: *Deleted

## 2017-11-21 ENCOUNTER — Other Ambulatory Visit: Payer: Self-pay | Admitting: Family Medicine

## 2017-11-21 MED ORDER — RIZATRIPTAN BENZOATE 10 MG PO TBDP
ORAL_TABLET | ORAL | 5 refills | Status: DC
Start: 2017-11-21 — End: 2019-03-31

## 2017-11-21 MED FILL — RIZATRIPTAN BENZOATE 10 MG: 10 | 30 days supply | Qty: 10 | Fill #0

## 2017-11-21 MED FILL — FEMYNOR 0.25-35 MG-MCG TABS: 0.25-35 | 84 days supply | Qty: 84 | Fill #3

## 2017-11-21 NOTE — Telephone Encounter (Signed)
Received fax from pharmacy stating that insurance will not cover eletriptan, requesting to change Rx to sumatriptan or rizatriptan or do PA. Please advise. Thanks.

## 2017-11-21 NOTE — Telephone Encounter (Signed)
OK. Rizatriptan eRx'd.

## 2017-11-21 NOTE — Telephone Encounter (Signed)
Triptan RF'd today. Her bP was up slightly at f/u 10/2017, but she had not been compliant with her bp med, plus home monitoring had been consistently normal.

## 2017-11-21 NOTE — Telephone Encounter (Signed)
BP was 140/98 on 10/30/17, last ov. Please advise. Thanks.

## 2017-11-25 ENCOUNTER — Ambulatory Visit: Payer: 59 | Admitting: Family Medicine

## 2017-11-25 ENCOUNTER — Encounter: Payer: Self-pay | Admitting: Family Medicine

## 2017-11-25 VITALS — BP 139/87 | HR 83 | Resp 16 | Wt 241.0 lb

## 2017-11-25 DIAGNOSIS — F411 Generalized anxiety disorder: Secondary | ICD-10-CM

## 2017-11-25 DIAGNOSIS — Z79891 Long term (current) use of opiate analgesic: Secondary | ICD-10-CM | POA: Diagnosis not present

## 2017-11-25 DIAGNOSIS — F5105 Insomnia due to other mental disorder: Secondary | ICD-10-CM

## 2017-11-25 DIAGNOSIS — F99 Mental disorder, not otherwise specified: Secondary | ICD-10-CM | POA: Diagnosis not present

## 2017-11-25 DIAGNOSIS — G894 Chronic pain syndrome: Secondary | ICD-10-CM | POA: Diagnosis not present

## 2017-11-25 DIAGNOSIS — F3341 Major depressive disorder, recurrent, in partial remission: Secondary | ICD-10-CM | POA: Diagnosis not present

## 2017-11-25 MED ORDER — DULOXETINE HCL 30 MG PO CPEP: 30.0000 mg | ORAL_CAPSULE | Freq: Every day | ORAL | 6 refills | Status: DC

## 2017-11-25 MED ORDER — CLONIDINE HCL 0.1 MG PO TABS: ORAL_TABLET | ORAL | 0 refills | Status: DC

## 2017-11-25 MED FILL — ALPRAZolam 1 MG TABS: 1 | 30 days supply | Qty: 30 | Fill #2

## 2017-11-25 MED FILL — CloNIDine HCL 0.1 MG TAB: 0.1 | 20 days supply | Qty: 60 | Fill #0

## 2017-11-25 MED FILL — DULoxetine HCL 30 MG CPEP: 30 | 30 days supply | Qty: 30 | Fill #0

## 2017-11-25 NOTE — Progress Notes (Signed)
OFFICE VISIT  11/25/2017   CC:  Chief Complaint  Patient presents with  . Depression    follow up     HPI:    Patient is a 42 y.o.  female who presents for f/u MDD and GAD. Last visit she was having a recurrence of depression and anxiety and we got her back on cymbalta 30 mg qd. Continued xanax at 1/2-1 tab po qd prn. Also started clonidine at 0.1mg  qhs for insomnia.  Feels improved--about 70-80% ---just some leftover bad feelings about getting separated from husband. Tolerating med well.  Just some nightmares if she forgets a dose.  Still in counseling with the [facility]. She wants to stick with 30mg  qd dosing.  Sleep not improved any.  Initiates sleep fine, wakes up about 3AM still and can't go back to sleep. No SE from clonidine.  Past Medical History:  Diagnosis Date  . Allergy   . 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 Anxiety and depression    started with PPD  . Bacterial vaginitis   . Chronic neck pain    Myofascial pain syndrome per [facility] [facility], oxycodone per their office pain med contract.  (Normal cervical MRI 2010 per ortho notes).  . Dietary iron deficiency without anemia   . Easy bruisability 2011   Hx of: saw Hematologist (Dr. Cyndie ChimeGranfortuna) and w/u neg for von Willebrand desease.  Marland Kitchen. GERD (gastroesophageal reflux disease)    hx gastric ulcer and upper GI bleed, ? NSAID induced  . History of stomach ulcers   . Hypertension   . Migraine   . Reactive hypoglycemia    Dr. Everardo AllEllison  . Seizure (HCC) 2015   s/p eval with neuro, no medication, no events since  . Syncope    with ? seizure in 2015, eval with neruo  . Vitamin B12 deficiency   . Vitamin D deficiency     Past Surgical History:  Procedure Laterality Date  . CESAREAN SECTION  2006  . ENDOMETRIAL BIOPSY    . EYE SURGERY Bilateral    2001  . GASTRIC BYPASS  2002  . TONSILLECTOMY AND ADENOIDECTOMY    . tubes in ears both Bilateral   . tubuligation  2006    Outpatient Medications  Prior to Visit  Medication Sig Dispense Refill  . acetaminophen (TYLENOL) 500 MG tablet Take 1,000 mg by mouth every 6 (six) hours as needed for mild pain, moderate pain or headache.    . Alcohol Swabs (ALCOHOL WIPES) 70 % PADS Use to clean finger before checking blood sugar, checks blood sugar 3-4 times daily 100 each 11  . BIOTIN PO Take 1 tablet by mouth daily.     . Calcium 250 MG CAPS Take 1 capsule by mouth daily.     . cetirizine (ZYRTEC) 10 MG tablet Take 1 tablet (10 mg total) by mouth daily. 30 tablet 11  . Cholecalciferol (VITAMIN D) 2000 units CAPS Take 1 capsule by mouth daily.    . ferrous sulfate 325 (65 FE) MG EC tablet Take 325 mg by mouth daily.    . fluticasone (FLONASE) 50 MCG/ACT nasal spray Place 2 sprays into both nostrils daily. 16 g 12  . glucose blood test strip Use to check blood sugar three times daily. 100 each 12  . hydrochlorothiazide (HYDRODIURIL) 25 MG tablet TAKE 1 TABLET BY MOUTH DAILY. 30 tablet 5  . metoprolol succinate (TOPROL-XL) 25 MG 24 hr tablet Take 1 tablet (25 mg total) by mouth daily. 30  tablet 6  . MONONESSA 0.25-35 MG-MCG tablet Take 1 tablet by mouth once daily  4  . montelukast (SINGULAIR) 10 MG tablet Take 1 tablet (10 mg total) by mouth at bedtime. 30 tablet 6  . omeprazole (PRILOSEC) 20 MG capsule TAKE 1 CAPSULE BY MOUTH 2 TIMES DAILY BEFORE A MEAL 60 capsule 11  . oxyCODONE-acetaminophen (PERCOCET) 10-325 MG per tablet Take 1 tablet by mouth every 8 (eight) hours as needed for pain. Take 1 tablet by mouth every 8 hours as needed for severe pain. 15 tablet 0  . rizatriptan (MAXALT-MLT) 10 MG disintegrating tablet 1 tab po qd as needed for migraine headache.  May repeat dose in 2 hours if needed.  Max dose in 24h is 20 mg. 10 tablet 5  . vitamin B-12 (CYANOCOBALAMIN) 1000 MCG tablet Take 1,000 mcg by mouth daily.    . cloNIDine (CATAPRES) 0.1 MG tablet 1 tab po qhs to help with insomnia/sleep 30 tablet 0  . DULoxetine (CYMBALTA) 30 MG capsule  Take 1 capsule (30 mg total) by mouth daily. 30 capsule 0  . ALPRAZolam (XANAX) 1 MG tablet 1/2-1 tab po qd prn (Patient not taking: Reported on 11/25/2017) 30 tablet 5   Facility-Administered Medications Prior to Visit  Medication Dose Route Frequency Provider Last Rate Last Dose  . cyanocobalamin ((VITAMIN B-12)) injection 1,000 mcg  1,000 mcg Intramuscular Q6 months Chesney Suares, Maryjean Morn, MD        Allergies  Allergen Reactions  . Ibuprofen     Causes ulcers    ROS As per HPI  PE: Blood pressure 139/87, pulse 83, resp. rate 16, weight 241 lb (109.3 kg), SpO2 96 %. Gen: Alert, well appearing.  Patient is oriented to person, place, time, and situation. AFFECT: pleasant, lucid thought and speech. No further exam today.  LABS:  None today.  IMPRESSION AND PLAN:  1) MDD, recurrrent + GAD.  Somewhat situational this time (separation/divorce). Much improved on cymbalta x 3 wks.  Continue 30mg  qd dosing. Continue xanax 1mg , 1/2-1 tab qd.  2) Insomnia, likely a symptom of her dep/anx: hopefully will improve over time the longer she is on cymbalta. However, will try increasing clonidine to 0.2-0.3 mg qhs. If not improved at 3 week f/u will d/c this med and try something different.  An After Visit Summary was printed and given to the patient.  FOLLOW UP: Return in about 3 weeks (around 12/16/2017) for f/u mood/anx/insomnia.  Signed:  Santiago Bumpers, MD           11/25/2017

## 2017-12-01 ENCOUNTER — Encounter: Payer: Self-pay | Admitting: Family Medicine

## 2017-12-01 MED ORDER — TRAZODONE HCL 100 MG PO TABS: 100.0000 mg | ORAL_TABLET | Freq: Every day | ORAL | 5 refills | Status: DC

## 2017-12-01 MED FILL — traZODone HCL 100 MG TABS: 100 | 30 days supply | Qty: 30 | Fill #0

## 2017-12-01 NOTE — Telephone Encounter (Signed)
Please advise. Thanks.   Pt advised Dr. McGowen is out of the office today.  

## 2017-12-01 NOTE — Telephone Encounter (Signed)
OK, trazodone eRx'd. Clonidine d/c'd from med list.

## 2017-12-02 ENCOUNTER — Other Ambulatory Visit: Payer: Self-pay | Admitting: Family Medicine

## 2017-12-02 MED FILL — MONTELUKAST SOD 10 MG TAB: 10 | 30 days supply | Qty: 30 | Fill #3

## 2017-12-02 MED FILL — OMEPRAZOLE 20 MG CAP: 20 | 30 days supply | Qty: 60 | Fill #6

## 2017-12-02 MED FILL — FLUTICASONE PROP 50 MCG SPR: 50 | 30 days supply | Qty: 16 | Fill #0

## 2017-12-12 MED FILL — FREESTYLE LITE TEST STRIP: 60 days supply | Qty: 200 | Fill #1

## 2017-12-19 MED FILL — DULoxetine HCL 30 MG CPEP: 30 | 30 days supply | Qty: 30 | Fill #1

## 2017-12-22 ENCOUNTER — Encounter: Payer: Self-pay | Admitting: *Deleted

## 2017-12-22 ENCOUNTER — Ambulatory Visit: Payer: 59 | Admitting: Family Medicine

## 2017-12-22 ENCOUNTER — Encounter: Payer: Self-pay | Admitting: Family Medicine

## 2017-12-22 VITALS — BP 106/68 | HR 69 | Temp 98.2°F | Resp 16 | Ht 65.0 in | Wt 240.2 lb

## 2017-12-22 DIAGNOSIS — K909 Intestinal malabsorption, unspecified: Secondary | ICD-10-CM

## 2017-12-22 DIAGNOSIS — F5101 Primary insomnia: Secondary | ICD-10-CM | POA: Diagnosis not present

## 2017-12-22 DIAGNOSIS — F4323 Adjustment disorder with mixed anxiety and depressed mood: Secondary | ICD-10-CM

## 2017-12-22 DIAGNOSIS — F330 Major depressive disorder, recurrent, mild: Secondary | ICD-10-CM | POA: Diagnosis not present

## 2017-12-22 DIAGNOSIS — F411 Generalized anxiety disorder: Secondary | ICD-10-CM | POA: Diagnosis not present

## 2017-12-22 DIAGNOSIS — I10 Essential (primary) hypertension: Secondary | ICD-10-CM | POA: Diagnosis not present

## 2017-12-22 LAB — BASIC METABOLIC PANEL
BUN: 13 mg/dL (ref 6–23)
CHLORIDE: 100 meq/L (ref 96–112)
CO2: 27 mEq/L (ref 19–32)
CREATININE: 0.73 mg/dL (ref 0.40–1.20)
Calcium: 9.5 mg/dL (ref 8.4–10.5)
GFR: 112.34 mL/min (ref >60.00)
GLUCOSE: 89 mg/dL (ref 70–99)
POTASSIUM: 4 meq/L (ref 3.5–5.1)
Sodium: 139 mEq/L (ref 135–145)

## 2017-12-22 NOTE — Progress Notes (Signed)
OFFICE VISIT  12/22/2017   CC:  Chief Complaint  Patient presents with  . Follow-up    mood, anxiety and insomnia   HPI:    Patient is a 42 y.o.  female who presents for 3 mo f/u GAD, hx of depression, HTN, and hx of recurrent symptomatic hypoglycemia. 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  She is trying to lower carb intake lately to help with this..  Anxiety/Dep; on xanax regularly as well as cymbalta.  Feels 75% improved since getting on cymbalta a couple of months ago. Still struggling with marriage/divorce problem.  Legal issues surrounding this are excessive.  Sounds like she is more optimistic now than before.   Insomnia: taking 100 mg traz qhs and this is helping a lot with sleep now.  BP: taking meds.  No home bp checks.  No dizziness, HA, palp's, CP, or sob.    Past Medical History:  Diagnosis Date  . Allergy   . 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 Anxiety and depression    started with PPD  . Bacterial vaginitis   . Chronic neck pain    Myofascial pain syndrome per [facility] [facility], oxycodone per their office pain med contract.  (Normal cervical MRI 2010 per ortho notes).  . Dietary iron deficiency without anemia   . Easy bruisability 2011   Hx of: saw Hematologist (Dr. Cyndie Chime) and w/u neg for von Willebrand desease.  Marland Kitchen GERD (gastroesophageal reflux disease)    hx gastric ulcer and upper GI bleed, ? NSAID induced  . History of stomach ulcers   . Hypertension   . Migraine   . Reactive hypoglycemia    Dr. Everardo All  . Seizure (HCC) 2015   s/p eval with neuro, no medication, no events since  . Syncope    with ? seizure in 2015, eval with neruo  . Vitamin B12 deficiency   . Vitamin D deficiency     Past Surgical History:  Procedure Laterality Date  . CESAREAN SECTION  2006  . ENDOMETRIAL BIOPSY    . EYE SURGERY Bilateral    2001  . GASTRIC BYPASS  2002  . TONSILLECTOMY AND ADENOIDECTOMY     . tubes in ears both Bilateral   . tubuligation  2006    Outpatient Medications Prior to Visit  Medication Sig Dispense Refill  . acetaminophen (TYLENOL) 500 MG tablet Take 1,000 mg by mouth every 6 (six) hours as needed for mild pain, moderate pain or headache.    . Alcohol Swabs (ALCOHOL WIPES) 70 % PADS Use to clean finger before checking blood sugar, checks blood sugar 3-4 times daily 100 each 11  . ALPRAZolam (XANAX) 1 MG tablet 1/2-1 tab po qd prn 30 tablet 5  . BIOTIN PO Take 1 tablet by mouth daily.     . Calcium 250 MG CAPS Take 1 capsule by mouth daily.     . cetirizine (ZYRTEC) 10 MG tablet Take 1 tablet (10 mg total) by mouth daily. 30 tablet 11  . Cholecalciferol (VITAMIN D) 2000 units CAPS Take 1 capsule by mouth daily.    . DULoxetine (CYMBALTA) 30 MG capsule Take 1 capsule (30 mg total) by mouth daily. 30 capsule 6  . fluticasone (FLONASE) 50 MCG/ACT nasal spray PLACE 2 SPRAYS INTO BOTH NOSTRILS DAILY. 16 g 12  . glucose blood test strip Use to check blood sugar three times daily. 100 each 12  . hydrochlorothiazide (HYDRODIURIL) 25 MG  tablet TAKE 1 TABLET BY MOUTH DAILY. 30 tablet 5  . metoprolol succinate (TOPROL-XL) 25 MG 24 hr tablet Take 1 tablet (25 mg total) by mouth daily. 30 tablet 6  . MONONESSA 0.25-35 MG-MCG tablet Take 1 tablet by mouth once daily  4  . montelukast (SINGULAIR) 10 MG tablet Take 1 tablet (10 mg total) by mouth at bedtime. 30 tablet 6  . omeprazole (PRILOSEC) 20 MG capsule TAKE 1 CAPSULE BY MOUTH 2 TIMES DAILY BEFORE A MEAL 60 capsule 11  . oxyCODONE-acetaminophen (PERCOCET) 10-325 MG per tablet Take 1 tablet by mouth every 8 (eight) hours as needed for pain. Take 1 tablet by mouth every 8 hours as needed for severe pain. 15 tablet 0  . rizatriptan (MAXALT-MLT) 10 MG disintegrating tablet 1 tab po qd as needed for migraine headache.  May repeat dose in 2 hours if needed.  Max dose in 24h is 20 mg. 10 tablet 5  . traZODone (DESYREL) 100 MG tablet  Take 1 tablet (100 mg total) by mouth at bedtime. 30 tablet 5  . vitamin B-12 (CYANOCOBALAMIN) 1000 MCG tablet Take 1,000 mcg by mouth daily.    . ferrous sulfate 325 (65 FE) MG EC tablet Take 325 mg by mouth daily.     Facility-Administered Medications Prior to Visit  Medication Dose Route Frequency Provider Last Rate Last Dose  . cyanocobalamin ((VITAMIN B-12)) injection 1,000 mcg  1,000 mcg Intramuscular Q6 months McGowen, Maryjean MornPhilip H, MD        Allergies  Allergen Reactions  . Ibuprofen     Causes ulcers    ROS As per HPI  PE: Blood pressure 106/68, pulse 69, temperature 98.2 F (36.8 C), temperature source Oral, resp. rate 16, height 5\' 5"  (1.651 m), weight 240 lb 4 oz (109 kg), last menstrual period 12/18/2017, SpO2 100 %. Gen: Alert, well appearing.  Patient is oriented to person, place, time, and situation. AFFECT: pleasant, lucid thought and speech. No further exam today.  LABS:  Lab Results  Component Value Date   TSH 1.26 10/30/2017   Lab Results  Component Value Date   WBC 5.3 10/30/2017   HGB 14.2 10/30/2017   HCT 42.2 10/30/2017   MCV 91.2 10/30/2017   PLT 302.0 10/30/2017   Lab Results  Component Value Date   CREATININE 0.78 09/01/2017   BUN 12 09/01/2017   NA 136 09/01/2017   K 4.1 09/01/2017   CL 102 09/01/2017   CO2 25 09/01/2017   Lab Results  Component Value Date   ALT 9 11/01/2016   AST 12 11/01/2016   ALKPHOS 70 11/01/2016   BILITOT 0.5 11/01/2016   Lab Results  Component Value Date   CHOL 236 (H) 11/01/2016   Lab Results  Component Value Date   HDL 76.90 11/01/2016   Lab Results  Component Value Date   LDLCALC 126 (H) 11/01/2016   Lab Results  Component Value Date   TRIG 166.0 (H) 11/01/2016   Lab Results  Component Value Date   CHOLHDL 3 11/01/2016   Lab Results  Component Value Date   HGBA1C 4.6 08/07/2017    IMPRESSION AND PLAN:  1) GAD/Dep, adjustment d/o superimposed on this. Signif improved but not quite in  remission. No change in cymbalta or xanax today. Controlled substance contract reviewed with pt, signed, in chart today. UDS at future visit.  2) HTN: The current medical regimen is effective;  continue present plan and medications. BMET today.  3) Malabsorption/sympt hypoglyc secondary to  roux-en-Y gastric bipass surg:  Recent vitamin check good.  Decrease carb intake to see if this helps.  4) Insomnia: doing much better on trazodone.  An After Visit Summary was printed and given to the patient.  FOLLOW UP: Return for keep f/u appt already set for 02/2018.  Signed:  Santiago Bumpers, MD           12/22/2017

## 2017-12-23 MED FILL — ALPRAZolam 1 MG TABS: 1 | 30 days supply | Qty: 30 | Fill #3

## 2017-12-23 MED FILL — HYDROCHLOROTHIAZIDE 25 MG T: 25 | 90 days supply | Qty: 90 | Fill #1

## 2017-12-26 ENCOUNTER — Telehealth: Payer: Self-pay | Admitting: *Deleted

## 2017-12-26 ENCOUNTER — Encounter: Payer: Self-pay | Admitting: Family Medicine

## 2017-12-26 NOTE — Telephone Encounter (Signed)
Letter printed.

## 2017-12-26 NOTE — Telephone Encounter (Signed)
Letter signed, letter given to Diane to email to pt.

## 2017-12-26 NOTE — Progress Notes (Signed)
Opened this only to do letter.  Signed:  Santiago BumpersPhil Dwan Hemmelgarn, MD           12/26/2017

## 2017-12-26 NOTE — Telephone Encounter (Signed)
Copied from CRM [id number]. Topic: Inquiry >> Dec 26, 2017 11:28 AM Alexander BergeronBarksdale, Harvey B wrote: Reason for CRM: pt called to get a doctors note for her to return to work on Monday pt had to leave work Thursday and is out today due to bp issues, contact pt to advise, email letter if possible

## 2017-12-26 NOTE — Telephone Encounter (Signed)
Please advise. Thanks.  

## 2017-12-29 NOTE — Telephone Encounter (Signed)
Emailed 12/26/17

## 2017-12-30 MED FILL — OMEPRAZOLE 20 MG CAP: 20 | 30 days supply | Qty: 60 | Fill #7

## 2017-12-30 MED FILL — traZODone HCL 100 MG TABS: 100 | 30 days supply | Qty: 30 | Fill #1

## 2017-12-31 NOTE — Telephone Encounter (Signed)
SW pt, pt did received letter that was emailed on 12/26/17.

## 2018-01-22 MED FILL — DULoxetine HCL 30 MG CPEP: 30 | 30 days supply | Qty: 30 | Fill #2

## 2018-01-22 MED FILL — MONTELUKAST SOD 10 MG TAB: 10 | 30 days supply | Qty: 30 | Fill #4

## 2018-01-22 MED FILL — ALPRAZolam 1 MG TABS: 1 | 30 days supply | Qty: 30 | Fill #4

## 2018-02-02 MED FILL — OMEPRAZOLE 20 MG CAP: 20 | 30 days supply | Qty: 60 | Fill #8

## 2018-02-02 MED FILL — traZODone HCL 100 MG TABS: 100 | 30 days supply | Qty: 30 | Fill #2

## 2018-02-02 MED FILL — FLUTICASONE PROP 50 MCG SPR: 50 | 30 days supply | Qty: 16 | Fill #1

## 2018-02-02 MED FILL — FEMYNOR 0.25-35 MG-MCG TABS: 0.25-35 | 84 days supply | Qty: 84 | Fill #0

## 2018-02-06 ENCOUNTER — Other Ambulatory Visit (HOSPITAL_COMMUNITY)
Admission: RE | Admit: 2018-02-06 | Discharge: 2018-02-06 | Disposition: A | Discharge status: Home / Self Care | Payer: 59 | Source: Ambulatory Visit | Attending: Family Medicine | Admitting: Family Medicine

## 2018-02-06 ENCOUNTER — Ambulatory Visit (INDEPENDENT_AMBULATORY_CARE_PROVIDER_SITE_OTHER): Payer: 59 | Admitting: Family Medicine

## 2018-02-06 ENCOUNTER — Other Ambulatory Visit (INDEPENDENT_AMBULATORY_CARE_PROVIDER_SITE_OTHER): Payer: 59

## 2018-02-06 ENCOUNTER — Encounter: Payer: Self-pay | Admitting: Family Medicine

## 2018-02-06 VITALS — BP 103/70 | HR 78 | Temp 98.3°F | Resp 16 | Ht 65.0 in | Wt 234.4 lb

## 2018-02-06 DIAGNOSIS — K9089 Other intestinal malabsorption: Secondary | ICD-10-CM | POA: Diagnosis not present

## 2018-02-06 DIAGNOSIS — Z7251 High risk heterosexual behavior: Secondary | ICD-10-CM | POA: Insufficient documentation

## 2018-02-06 DIAGNOSIS — Z79899 Other long term (current) drug therapy: Secondary | ICD-10-CM

## 2018-02-06 DIAGNOSIS — Z0000 Encounter for general adult medical examination without abnormal findings: Secondary | ICD-10-CM

## 2018-02-06 DIAGNOSIS — E538 Deficiency of other specified B group vitamins: Secondary | ICD-10-CM

## 2018-02-06 DIAGNOSIS — H5213 Myopia, bilateral: Secondary | ICD-10-CM | POA: Diagnosis not present

## 2018-02-06 DIAGNOSIS — E669 Obesity, unspecified: Secondary | ICD-10-CM

## 2018-02-06 DIAGNOSIS — H52223 Regular astigmatism, bilateral: Secondary | ICD-10-CM | POA: Diagnosis not present

## 2018-02-06 DIAGNOSIS — G894 Chronic pain syndrome: Secondary | ICD-10-CM | POA: Diagnosis not present

## 2018-02-06 LAB — CBC WITH DIFFERENTIAL/PLATELET
BASOS PCT: 0.6 % (ref 0.0–3.0)
Basophils Absolute: 0.1 10*3/uL (ref 0.0–0.1)
EOS PCT: 0.3 % (ref 0.0–5.0)
Eosinophils Absolute: 0 10*3/uL (ref 0.0–0.7)
HEMATOCRIT: 40 % (ref 36.0–46.0)
HEMOGLOBIN: 13.5 g/dL (ref 12.0–15.0)
LYMPHS PCT: 28.3 % (ref 12.0–46.0)
Lymphs Abs: 2.9 10*3/uL (ref 0.7–4.0)
MCHC: 33.9 g/dL (ref 30.0–36.0)
MCV: 92.4 fl (ref 78.0–100.0)
MONO ABS: 0.6 10*3/uL (ref 0.1–1.0)
MONOS PCT: 5.6 % (ref 3.0–12.0)
Neutro Abs: 6.6 10*3/uL (ref 1.4–7.7)
Neutrophils Relative %: 65.2 % (ref 43.0–77.0)
Platelets: 310 10*3/uL (ref 150.0–400.0)
RBC: 4.32 Mil/uL (ref 3.87–5.11)
RDW: 13.6 % (ref 11.5–15.5)
WBC: 10.1 10*3/uL (ref 4.0–10.5)

## 2018-02-06 LAB — VITAMIN D 25 HYDROXY (VIT D DEFICIENCY, FRACTURES): VITD: 36.07 ng/mL (ref 30.00–100.00)

## 2018-02-06 LAB — FERRITIN: Ferritin: 17.6 ng/mL (ref 10.0–291.0)

## 2018-02-06 LAB — COMPREHENSIVE METABOLIC PANEL
ALBUMIN: 3.7 g/dL (ref 3.5–5.2)
ALK PHOS: 41 U/L (ref 39–117)
ALT: 12 U/L (ref 0–35)
AST: 12 U/L (ref 0–37)
BUN: 10 mg/dL (ref 6–23)
CALCIUM: 8.8 mg/dL (ref 8.4–10.5)
CO2: 26 mEq/L (ref 19–32)
Chloride: 99 mEq/L (ref 96–112)
Creatinine, Ser: 0.85 mg/dL (ref 0.40–1.20)
GFR: 94.19 mL/min (ref >60.00)
Glucose, Bld: 85 mg/dL (ref 70–99)
POTASSIUM: 3.4 meq/L — AB (ref 3.5–5.1)
SODIUM: 136 meq/L (ref 135–145)
TOTAL PROTEIN: 6.5 g/dL (ref 6.0–8.3)
Total Bilirubin: 0.5 mg/dL (ref 0.2–1.2)

## 2018-02-06 LAB — LIPID PANEL
CHOLESTEROL: 197 mg/dL (ref 0–200)
HDL: 79.8 mg/dL (ref >39.00)
LDL CALC: 89 mg/dL (ref 0–99)
NonHDL: 116.9
Total CHOL/HDL Ratio: 2
Triglycerides: 138 mg/dL (ref 0.0–149.0)
VLDL: 27.6 mg/dL (ref 0.0–40.0)

## 2018-02-06 LAB — TSH: TSH: 0.74 u[IU]/mL (ref 0.35–4.50)

## 2018-02-06 NOTE — Addendum Note (Signed)
Addended by: Jeoffrey Massed on: 02/06/2018 09:43 AM   Modules accepted: Orders

## 2018-02-06 NOTE — Progress Notes (Addendum)
Office Note 02/06/2018  CC:  Chief Complaint  Patient presents with  . Annual Exam    Pt is fasting.    HPI:  Toni [name] is a 42 y.o.  female who is here for annual health maintenance exam.  Exercise: walking 20 min per day.   Diet: trying to cut back on carbs.  Eating smaller, more frequent meals.  Most recent alprazolam was a couple weeks ago. Takes her oxycodone from pain mgmt MD daily.  Cymbalta helping significantly for anx/adjustment d/o---feeling less anx/stress/dep about her recent separation/divorce.  Past Medical History:  Diagnosis Date  . Allergy   . 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 Anxiety and depression    started with PPD  . Bacterial vaginitis   . Chronic neck pain    Myofascial pain syndrome per [place] [facility], oxycodone per their office pain med contract.  (Normal cervical MRI 2010 per ortho notes).  . Dietary iron deficiency without anemia   . Easy bruisability 2011   Hx of: saw Hematologist (Dr. Cyndie Chime) and w/u neg for von Willebrand desease.  Marland Kitchen GERD (gastroesophageal reflux disease)    hx gastric ulcer and upper GI bleed, ? NSAID induced  . History of stomach ulcers   . Hypertension   . Migraine   . Reactive hypoglycemia    Dr. Everardo All  . Seizure (HCC) 2015   s/p eval with neuro, no medication, no events since  . Syncope    with ? seizure in 2015, eval with neruo  . Vitamin B12 deficiency   . Vitamin D deficiency     Past Surgical History:  Procedure Laterality Date  . CESAREAN SECTION  2006  . ENDOMETRIAL BIOPSY    . EYE SURGERY Bilateral    2001  . GASTRIC BYPASS  2002  . TONSILLECTOMY AND ADENOIDECTOMY    . tubes in ears both Bilateral   . tubuligation  2006    Family History  Problem Relation Age of Onset  . Arthritis Mother   . Hyperlipidemia Mother   . Heart disease Mother   . Diabetes Mother   . Asthma Mother   . Aneurysm Father        deceased  . Lung cancer Maternal Aunt        smoker  . Colon  cancer Maternal Grandmother   . Lung cancer Maternal Aunt        smoker    Social History   Socioeconomic History  . Marital status: Legally Separated    Spouse name: Not on file  . Number of children: Not on file  . Years of education: Not on file  . Highest education level: Not on file  Occupational History  . Not on file  Social Needs  . Financial resource strain: Not on file  . Food insecurity:    Worry: Not on file    Inability: Not on file  . Transportation needs:    Medical: Not on file    Non-medical: Not on file  Tobacco Use  . Smoking status: Never Smoker  . Smokeless tobacco: Never Used  Substance and Sexual Activity  . Alcohol use: Yes    Alcohol/week: 0.0 oz    Comment: occasional   . Drug use: No  . Sexual activity: Not on file  Lifestyle  . Physical activity:    Days per week: Not on file    Minutes per session: Not on file  . Stress: Not on file  Relationships  . Social  connections:    Talks on phone: Not on file    Gets together: Not on file    Attends religious service: Not on file    Active member of club or organization: Not on file    Attends meetings of clubs or organizations: Not on file    Relationship status: Not on file  . Intimate partner violence:    Fear of current or ex partner: Not on file    Emotionally abused: Not on file    Physically abused: Not on file    Forced sexual activity: Not on file  Other Topics Concern  . Not on file  Social History Narrative   Married.   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   No T/A/Ds.    Outpatient Medications Prior to Visit  Medication Sig Dispense Refill  . acetaminophen (TYLENOL) 500 MG tablet Take 1,000 mg by mouth every 6 (six) hours as needed for mild pain, moderate pain or headache.    . Alcohol Swabs (ALCOHOL WIPES) 70 % PADS Use to clean finger before checking blood sugar, checks blood sugar 3-4 times daily 100 each 11  . ALPRAZolam (XANAX) 1 MG tablet 1/2-1 tab po qd prn 30  tablet 5  . Calcium 250 MG CAPS Take 1 capsule by mouth daily.     . cetirizine (ZYRTEC) 10 MG tablet Take 1 tablet (10 mg total) by mouth daily. 30 tablet 11  . Cholecalciferol (VITAMIN D) 2000 units CAPS Take 1 capsule by mouth daily.    . DULoxetine (CYMBALTA) 30 MG capsule Take 1 capsule (30 mg total) by mouth daily. 30 capsule 6  . fluticasone (FLONASE) 50 MCG/ACT nasal spray PLACE 2 SPRAYS INTO BOTH NOSTRILS DAILY. 16 g 12  . glucose blood test strip Use to check blood sugar three times daily. 100 each 12  . hydrochlorothiazide (HYDRODIURIL) 25 MG tablet TAKE 1 TABLET BY MOUTH DAILY. 30 tablet 5  . metoprolol succinate (TOPROL-XL) 25 MG 24 hr tablet Take 1 tablet (25 mg total) by mouth daily. 30 tablet 6  . MONONESSA 0.25-35 MG-MCG tablet Take 1 tablet by mouth once daily  4  . montelukast (SINGULAIR) 10 MG tablet Take 1 tablet (10 mg total) by mouth at bedtime. 30 tablet 6  . omeprazole (PRILOSEC) 20 MG capsule TAKE 1 CAPSULE BY MOUTH 2 TIMES DAILY BEFORE A MEAL 60 capsule 11  . oxyCODONE-acetaminophen (PERCOCET) 10-325 MG per tablet Take 1 tablet by mouth every 8 (eight) hours as needed for pain. Take 1 tablet by mouth every 8 hours as needed for severe pain. 15 tablet 0  . rizatriptan (MAXALT-MLT) 10 MG disintegrating tablet 1 tab po qd as needed for migraine headache.  May repeat dose in 2 hours if needed.  Max dose in 24h is 20 mg. 10 tablet 5  . traZODone (DESYREL) 100 MG tablet Take 1 tablet (100 mg total) by mouth at bedtime. 30 tablet 5  . vitamin B-12 (CYANOCOBALAMIN) 1000 MCG tablet Take 1,000 mcg by mouth daily.    Marland Kitchen BIOTIN PO Take 1 tablet by mouth daily.     . ferrous sulfate 325 (65 FE) MG EC tablet Take 325 mg by mouth daily.     Facility-Administered Medications Prior to Visit  Medication Dose Route Frequency Provider Last Rate Last Dose  . cyanocobalamin ((VITAMIN B-12)) injection 1,000 mcg  1,000 mcg Intramuscular Q6 months Ajee Heasley, Maryjean Morn, MD        Allergies   Allergen Reactions  .  Ibuprofen     Causes ulcers    ROS Review of Systems  Constitutional: Negative for appetite change, chills, fatigue and fever.  HENT: Negative for congestion, dental problem, ear pain and sore throat.   Eyes: Negative for discharge, redness and visual disturbance.  Respiratory: Negative for cough, chest tightness, shortness of breath and wheezing.   Cardiovascular: Negative for chest pain, palpitations and leg swelling.  Gastrointestinal: Negative for abdominal pain, blood in stool, diarrhea, nausea and vomiting.  Genitourinary: Negative for difficulty urinating, dysuria, flank pain, frequency, hematuria and urgency.  Musculoskeletal: Negative for arthralgias, back pain, joint swelling, myalgias and neck stiffness.  Skin: Negative for pallor and rash.  Neurological: Negative for dizziness, speech difficulty, weakness and headaches.  Hematological: Negative for adenopathy. Does not bruise/bleed easily.  Psychiatric/Behavioral: Negative for confusion and sleep disturbance. The patient is not nervous/anxious.     PE; Blood pressure 103/70, pulse 78, temperature 98.3 F (36.8 C), temperature source Oral, resp. rate 16, height  (1.651 m), weight 234 lb 6 oz (106.3 kg), last menstrual period 01/07/2018, SpO2 98 %. Body mass index is 39 kg/m. Pt examined with Pryor Ochoa, CMA, as chaperone.  Gen: Alert, well appearing.  Patient is oriented to person, place, time, and situation. AFFECT: pleasant, lucid thought and speech. ENT: Ears: EACs clear, normal epithelium.  TMs with good light reflex and landmarks bilaterally.  Eyes: no injection, icteris, swelling, or exudate.  EOMI, PERRLA. Nose: no drainage or turbinate edema/swelling.  No injection or focal lesion.  Mouth: lips without lesion/swelling.  Oral mucosa pink and moist.  Dentition intact and without obvious caries or gingival swelling.  Oropharynx without erythema, exudate, or swelling.  Neck:  supple/nontender.  No LAD, mass, or TM.  Carotid pulses 2+ bilaterally, without bruits. CV: RRR, no m/r/g.   LUNGS: CTA bilat, nonlabored resps, good aeration in all lung fields. ABD: soft, NT, ND, BS normal.  No hepatospenomegaly or mass.  No bruits. EXT: no clubbing, cyanosis, or edema.  Musculoskeletal: no joint swelling, erythema, warmth, or tenderness.  ROM of all joints intact. Skin - no sores or suspicious lesions or rashes or color changes   Pertinent labs:  Lab Results  Component Value Date   TSH 1.26 10/30/2017   Lab Results  Component Value Date   WBC 5.3 10/30/2017   HGB 14.2 10/30/2017   HCT 42.2 10/30/2017   MCV 91.2 10/30/2017   PLT 302.0 10/30/2017   Lab Results  Component Value Date   CREATININE 0.73 12/22/2017   BUN 13 12/22/2017   NA 139 12/22/2017   K 4.0 12/22/2017   CL 100 12/22/2017   CO2 27 12/22/2017   Lab Results  Component Value Date   ALT 9 11/01/2016   AST 12 11/01/2016   ALKPHOS 70 11/01/2016   BILITOT 0.5 11/01/2016   Lab Results  Component Value Date   CHOL 236 (H) 11/01/2016   Lab Results  Component Value Date   HDL 76.90 11/01/2016   Lab Results  Component Value Date   LDLCALC 126 (H) 11/01/2016   Lab Results  Component Value Date   TRIG 166.0 (H) 11/01/2016   Lab Results  Component Value Date   CHOLHDL 3 11/01/2016   Lab Results  Component Value Date   HGBA1C 4.6 08/07/2017   Lab Results  Component Value Date   VITAMINB12 346 10/30/2017   Lab Results  Component Value Date   IRON 162 (H) 10/30/2017   IRON 150 10/30/2017   TIBC  388 10/30/2017   FERRITIN 42.2 10/30/2017    ASSESSMENT AND PLAN:   Health maintenance exam: Reviewed age and gender appropriate health maintenance issues (prudent diet, regular exercise, health risks of tobacco and excessive alcohol, use of seatbelts, fire alarms in home, use of sunscreen).  Also reviewed age and gender appropriate health screening as well as vaccine  recommendations. Vaccines: all UTD. Labs: fasting HP, iron labs, B12, vit D (malabsorption syndrome). Cervical ca screening:  GYN appt in 1-2 mo. Breast ca screening: GYN appt in 1-2 mo. Colon ca screening: average risk patient= start screening at age 68 yrs.  At the end of today's visit, pt asked if she could be screened for STD's due to her suspicion of her ex-husband's infidelity.  She states she has no vag d/c, pelvic pain, or dysuria. I added on HIV, RPR, and urine GC/Chl testing today.  An After Visit Summary was printed and given to the patient.  FOLLOW UP:  Return in about 6 months (around 08/09/2018) for routine chronic illness f/u.  Signed:  Santiago Bumpers, MD           02/06/2018

## 2018-02-06 NOTE — Addendum Note (Signed)
Addended by: Eulah Pont on: 02/06/2018 09:56 AM   Modules accepted: Orders

## 2018-02-06 NOTE — Patient Instructions (Signed)

## 2018-02-07 LAB — HIV ANTIBODY (ROUTINE TESTING W REFLEX): HIV 1&2 Ab, 4th Generation: NONREACTIVE

## 2018-02-09 ENCOUNTER — Encounter: Payer: Self-pay | Admitting: Family Medicine

## 2018-02-09 ENCOUNTER — Other Ambulatory Visit: Payer: Self-pay | Admitting: Family Medicine

## 2018-02-09 LAB — URINE CYTOLOGY ANCILLARY ONLY
Chlamydia: NEGATIVE
Neisseria Gonorrhea: NEGATIVE

## 2018-02-09 LAB — RPR: RPR: NONREACTIVE

## 2018-02-09 MED ORDER — POTASSIUM CHLORIDE CRYS ER 20 MEQ PO TBCR: 20.0000 meq | EXTENDED_RELEASE_TABLET | Freq: Every day | ORAL | 6 refills | Status: DC

## 2018-02-09 MED FILL — POTASSIUM CL ER 20 MEQ TABL: 20 | 30 days supply | Qty: 30 | Fill #0

## 2018-02-10 ENCOUNTER — Encounter: Payer: Self-pay | Admitting: *Deleted

## 2018-02-10 LAB — PAIN MGMT, PROFILE 8 W/CONF, U
6 ACETYLMORPHINE: NEGATIVE ng/mL (ref <10)
ALPHAHYDROXYALPRAZOLAM: NEGATIVE ng/mL (ref <25)
Alcohol Metabolites: NEGATIVE ng/mL (ref <500)
Alphahydroxymidazolam: NEGATIVE ng/mL (ref <50)
Alphahydroxytriazolam: NEGATIVE ng/mL (ref <50)
Aminoclonazepam: NEGATIVE ng/mL (ref <25)
Amphetamines: NEGATIVE ng/mL (ref <500)
BENZODIAZEPINES: POSITIVE ng/mL — AB (ref <100)
Buprenorphine, Urine: NEGATIVE ng/mL (ref <5)
Cocaine Metabolite: NEGATIVE ng/mL (ref <150)
Codeine: NEGATIVE ng/mL (ref <50)
Creatinine: 198.2 mg/dL
HYDROCODONE: NEGATIVE ng/mL (ref <50)
HYDROMORPHONE: NEGATIVE ng/mL (ref <50)
HYDROXYETHYLFLURAZEPAM: NEGATIVE ng/mL (ref <50)
Lorazepam: NEGATIVE ng/mL (ref <50)
MARIJUANA METABOLITE: NEGATIVE ng/mL (ref <20)
MDMA: NEGATIVE ng/mL (ref <500)
MORPHINE: NEGATIVE ng/mL (ref <50)
Nordiazepam: NEGATIVE ng/mL (ref <50)
Norhydrocodone: NEGATIVE ng/mL (ref <50)
OPIATES: NEGATIVE ng/mL (ref <100)
OXYMORPHONE: 11698 ng/mL — AB (ref <50)
Oxazepam: 288 ng/mL — ABNORMAL HIGH (ref <50)
Oxidant: NEGATIVE ug/mL (ref <200)
Oxycodone: 16936 ng/mL — ABNORMAL HIGH (ref <50)
Oxycodone: POSITIVE ng/mL — AB (ref <100)
Temazepam: 56 ng/mL — ABNORMAL HIGH (ref <50)
pH: 6.08 (ref 4.5–9.0)

## 2018-02-19 MED FILL — ALPRAZolam 1 MG TABS: 1 | 30 days supply | Qty: 30 | Fill #5

## 2018-03-03 ENCOUNTER — Other Ambulatory Visit: Payer: Self-pay | Admitting: Family Medicine

## 2018-03-03 MED FILL — traZODone HCL 100 MG TABS: 100 | 30 days supply | Qty: 30 | Fill #3

## 2018-03-03 MED FILL — OMEPRAZOLE 20 MG CAP: 20 | 30 days supply | Qty: 60 | Fill #0

## 2018-03-03 MED FILL — DULoxetine HCL 30 MG CPEP: 30 | 30 days supply | Qty: 30 | Fill #3

## 2018-03-07 DIAGNOSIS — N926 Irregular menstruation, unspecified: Secondary | ICD-10-CM

## 2018-03-07 HISTORY — DX: Irregular menstruation, unspecified: N92.6

## 2018-03-13 MED FILL — POTASSIUM CL ER 20 MEQ TABL: 20 | 30 days supply | Qty: 30 | Fill #1

## 2018-03-18 ENCOUNTER — Other Ambulatory Visit: Payer: Self-pay | Admitting: Family Medicine

## 2018-03-18 NOTE — Telephone Encounter (Signed)
RF request for alprazolam LOV: 02/06/18 Next ov: 08/10/18 Last written: 09/24/17 #30 w/ 5RF  Please advise. Thanks.

## 2018-03-19 MED FILL — ALPRAZolam 1 MG TABS: 1 | 30 days supply | Qty: 30 | Fill #0

## 2018-04-03 DIAGNOSIS — Z1231 Encounter for screening mammogram for malignant neoplasm of breast: Secondary | ICD-10-CM | POA: Diagnosis not present

## 2018-04-03 DIAGNOSIS — Z113 Encounter for screening for infections with a predominantly sexual mode of transmission: Secondary | ICD-10-CM | POA: Diagnosis not present

## 2018-04-03 DIAGNOSIS — N920 Excessive and frequent menstruation with regular cycle: Secondary | ICD-10-CM | POA: Insufficient documentation

## 2018-04-03 DIAGNOSIS — Z01419 Encounter for gynecological examination (general) (routine) without abnormal findings: Secondary | ICD-10-CM | POA: Diagnosis not present

## 2018-04-03 DIAGNOSIS — Z6841 Body Mass Index (BMI) 40.0 and over, adult: Secondary | ICD-10-CM | POA: Diagnosis not present

## 2018-04-05 ENCOUNTER — Encounter: Payer: Self-pay | Admitting: Family Medicine

## 2018-04-06 MED FILL — OMEPRAZOLE 20 MG CAP: 20 | 30 days supply | Qty: 60 | Fill #1

## 2018-04-06 MED FILL — traZODone HCL 100 MG TABS: 100 | 30 days supply | Qty: 30 | Fill #4

## 2018-04-13 ENCOUNTER — Encounter: Payer: Self-pay | Admitting: Family Medicine

## 2018-04-14 MED FILL — VALACYCLOVIR HCL 500 MG TAB: 500 | 15 days supply | Qty: 30 | Fill #0

## 2018-04-14 MED FILL — ALPRAZolam 1 MG TABS: 1 | 30 days supply | Qty: 30 | Fill #1

## 2018-04-15 ENCOUNTER — Encounter: Payer: Self-pay | Admitting: Family Medicine

## 2018-05-05 ENCOUNTER — Encounter: Payer: Self-pay | Admitting: Family Medicine

## 2018-05-05 MED FILL — FEMYNOR 0.25-35 MG-MCG TABS: 0.25-35 | 84 days supply | Qty: 84 | Fill #0

## 2018-05-06 ENCOUNTER — Ambulatory Visit: Payer: 59 | Admitting: Family Medicine

## 2018-05-06 ENCOUNTER — Other Ambulatory Visit (HOSPITAL_COMMUNITY)
Admission: RE | Admit: 2018-05-06 | Discharge: 2018-05-06 | Disposition: A | Discharge status: Home / Self Care | Payer: 59 | Source: Ambulatory Visit | Attending: Family Medicine | Admitting: Family Medicine

## 2018-05-06 ENCOUNTER — Encounter: Payer: Self-pay | Admitting: Family Medicine

## 2018-05-06 VITALS — BP 129/78 | HR 64 | Temp 98.7°F | Resp 20 | Ht 65.0 in | Wt 237.0 lb

## 2018-05-06 DIAGNOSIS — N898 Other specified noninflammatory disorders of vagina: Secondary | ICD-10-CM | POA: Diagnosis not present

## 2018-05-06 NOTE — Patient Instructions (Signed)
We will call you with lab results once available and treat as needed.    Safe Sex Practicing safe sex means taking steps before and during sex to reduce your risk of:  Getting an STD (sexually transmitted disease).  Giving your partner an STD.  Unwanted pregnancy.  How can I practice safe sex?  To practice safe sex:  Limit your sexual partners to only one partner who is having sex with only you.  Avoid using alcohol and recreational drugs before having sex. These substances can affect your judgment.  Before having sex with a new partner: ? Talk to your partner about past partners, past STDs, and drug use. ? You and your partner should be screened for STDs and discuss the results with each other.  Check your body regularly for sores, blisters, rashes, or unusual discharge. If you notice any of these problems, visit your health care provider.  If you have symptoms of an infection or you are being treated for an STD, avoid sexual contact.  While having sex, use a condom. Make sure to: ? Use a condom every time you have vaginal, oral, or anal sex. Both females and males should wear condoms during oral sex. ? Keep condoms in place from the beginning to the end of sexual activity. ? Use a latex condom, if possible. Latex condoms offer the best protection. ? Use only water-based lubricants or oils to lubricate a condom. Using petroleum-based lubricants or oils will weaken the condom and increase the chance that it will break.  See your health care provider for regular screenings, exams, and tests for STDs.  Talk with your health care provider about the form of birth control (contraception) that is best for you.  Get vaccinated against hepatitis B and human papillomavirus (HPV).  If you are at risk of being infected with HIV (human immunodeficiency virus), talk with your health care provider about taking a prescription medicine to prevent HIV infection. You are considered at risk for  HIV if: ? You are a man who has sex with other men. ? You are a heterosexual man or woman who is sexually active with more than one partner. ? You take drugs by injection. ? You are sexually active with a partner who has HIV.  This information is not intended to replace advice given to you by your health care provider. Make sure you discuss any questions you have with your health care provider. Document Released: 10/31/2004 Document Revised: 02/07/2016 Document Reviewed: 08/13/2015 Elsevier Interactive Patient Education  [employer]2018 Elsevier Inc.

## 2018-05-06 NOTE — Progress Notes (Signed)
Toni [name] , 1976-04-22, 42 y.o., female MRN: [id number] Patient Care Team    Relationship Specialty Notifications Start End  McGowen, Maryjean Morn, MD PCP - General Family Medicine  04/01/16   Hoover Browns, MD Consulting Physician Obstetrics and Gynecology  11/23/15    Comment: [facility]  Sheran Luz, MD Consulting Physician Physical Medicine and Rehabilitation  05/10/16   Levert Feinstein, MD Consulting Physician Oncology  06/30/16   Henreitta Leber, PA-C Physician Assistant Obstetrics and Gynecology  06/23/17    Comment: [facility] OB/[facility]YN  Romero Belling, MD Consulting Physician Endocrinology  09/06/17   Su Hoff, PA-C Physician Assistant Chiropractic Medicine  03/03/18     Chief Complaint  Patient presents with  . Vaginal Discharge    odor,irritation     Subjective: Pt presents for an OV with complaints of vaginal discharge and irritation of ~1 week duration.  Associated symptoms include vaginal odor new partner and condom breaking a few days prior to onset of symptoms. Patient's last menstrual period was 05/06/2018 (exact date). Pt had negative chlamydia/gonorrhea, rpr and HIV 5/3/20219 with her PCP. She was then seen by her GYN 04/03/2018 with negative testing again for gonorrhea/chlamydia, HIV, hep B, hep c and RPR. 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 She has questions surrounding that test and its accuracy today. She reports She was tested for HSV2 last year (at gyn) and it was negative (no records).   Depression screen [facility] 2/9 02/06/2018 12/22/2017 11/25/2017 10/30/2017 05/01/2017  Decreased Interest 0 0 1 1 0  Down, Depressed, Hopeless 0 0 1 1 0  PHQ - 2 Score 0 0 2 2 0  Altered sleeping 0 1 3 2  0  Tired, decreased energy 0 1 1 2  0  Change in appetite 0 2 1 0 1  Feeling bad or failure about yourself  0 0 0 1 0  Trouble concentrating 0 1 0 0 0  Moving slowly or fidgety/restless 0 0 0 0 0  Suicidal thoughts 0 0 0 0 0  PHQ-9 Score 0 5 7 7 1   Difficult  doing work/chores - Somewhat difficult Somewhat difficult Somewhat difficult -    Allergies  Allergen Reactions  . Ibuprofen     Causes ulcers   Social History   Tobacco Use  . Smoking status: Never Smoker  . Smokeless tobacco: Never Used  Substance Use Topics  . Alcohol use: Yes    Alcohol/week: 0.0 oz    Comment: occasional    Past Medical History:  Diagnosis Date  . Allergic rhinitis   . 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 Anxiety and depression    started with PPD  . Chronic neck pain    Myofascial pain syndrome per [place] [facility], oxycodone per their office pain med contract.  (Normal cervical MRI 2010 per ortho notes).  . Dietary iron deficiency without anemia   . Easy bruisability 2011   Hx of: saw Hematologist (Dr. Cyndie Chime) and w/u neg for von Willebrand desease.  Marland Kitchen GERD (gastroesophageal reflux disease)    hx gastric ulcer and upper GI bleed, ? NSAID induced  . History of stomach ulcers   . Hypertension   . 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 Menorrhagia 03/2018   GYN started Femynor 0.25-0.35  . Migraine   . Reactive hypoglycemia    Dr. Everardo All  . Seizure (HCC) 2015   s/p eval with neuro, no medication, no events since  .  Syncope    with ? seizure in 2015, eval with neruo  . Vitamin B12 deficiency   . Vitamin D deficiency    Past Surgical History:  Procedure Laterality Date  . CESAREAN SECTION  2006  . ENDOMETRIAL BIOPSY    . EYE SURGERY Bilateral    2001  . GASTRIC BYPASS  2002  . TONSILLECTOMY AND ADENOIDECTOMY    . tubes in ears both Bilateral   . tubuligation  2006   Family History  Problem Relation Age of Onset  . Arthritis Mother   . Hyperlipidemia Mother   . Heart disease Mother   . Diabetes Mother   . Asthma Mother   . Aneurysm Father        deceased  . Lung cancer Maternal Aunt        smoker  . Colon cancer Maternal Grandmother   . Lung cancer Maternal Aunt        smoker   Allergies as of 05/06/2018       Reactions   Ibuprofen    Causes ulcers      Medication List        Accurate as of 05/06/18  9:13 AM. Always use your most recent med list.          acetaminophen 500 MG tablet Commonly known as:  TYLENOL Take 1,000 mg by mouth every 6 (six) hours as needed for mild pain, moderate pain or headache.   Alcohol Wipes 70 % Pads Use to clean finger before checking blood sugar, checks blood sugar 3-4 times daily   ALPRAZolam 1 MG tablet Commonly known as:  XANAX TAKE 1/2 TO 1 TABLET BY MOUTH ONCE DAILY AS NEEDED.   Calcium 250 MG Caps Take 1 capsule by mouth daily.   cetirizine 10 MG tablet Commonly known as:  ZYRTEC Take 1 tablet (10 mg total) by mouth daily.   DULoxetine 30 MG capsule Commonly known as:  CYMBALTA Take 1 capsule (30 mg total) by mouth daily.   fluticasone 50 MCG/ACT nasal spray Commonly known as:  FLONASE PLACE 2 SPRAYS INTO BOTH NOSTRILS DAILY.   glucose blood test strip Use to check blood sugar three times daily.   hydrochlorothiazide 25 MG tablet Commonly known as:  HYDRODIURIL TAKE 1 TABLET BY MOUTH DAILY.   metoprolol succinate 25 MG 24 hr tablet Commonly known as:  TOPROL-XL Take 1 tablet (25 mg total) by mouth daily.   MONONESSA 0.25-35 MG-MCG tablet Generic drug:  norgestimate-ethinyl estradiol Take 1 tablet by mouth once daily   montelukast 10 MG tablet Commonly known as:  SINGULAIR Take 1 tablet (10 mg total) by mouth at bedtime.   omeprazole 20 MG capsule Commonly known as:  PRILOSEC TAKE 1 CAPSULE BY MOUTH 2 TIMES DAILY BEFORE A MEAL   oxyCODONE-acetaminophen 10-325 MG tablet Commonly known as:  PERCOCET Take 1 tablet by mouth every 8 (eight) hours as needed for pain. Take 1 tablet by mouth every 8 hours as needed for severe pain.   potassium chloride SA 20 MEQ tablet Commonly known as:  K-DUR,KLOR-CON Take 1 tablet (20 mEq total) by mouth daily.   rizatriptan 10 MG disintegrating tablet Commonly known as:  MAXALT-MLT 1 tab  po qd as needed for migraine headache.  May repeat dose in 2 hours if needed.  Max dose in 24h is 20 mg.   traZODone 100 MG tablet Commonly known as:  DESYREL Take 1 tablet (100 mg total) by mouth at bedtime.   vitamin B-12  1000 MCG tablet Commonly known as:  CYANOCOBALAMIN Take 1,000 mcg by mouth daily.   Vitamin D 2000 units Caps Take 1 capsule by mouth daily.       All past medical history, surgical history, allergies, family history, immunizations andmedications were updated in the EMR today and reviewed under the history and medication portions of their EMR.     ROS: Negative, with the exception of above mentioned in HPI   Objective:  BP 129/78 (BP Location: Right Arm, Patient Position: Sitting, Cuff Size: Large)   Pulse 64   Temp 98.7 F (37.1 C)   Resp 20   Ht 5\' 5"  (1.651 m)   Wt 237 lb (107.5 kg)   LMP 04/02/2018   SpO2 100%   BMI 39.44 kg/m  Body mass index is 39.44 kg/m. Gen: Afebrile. No acute distress. Nontoxic in appearance, well developed, well nourished.  HENT: AT. [place].MMM Eyes:Pupils Equal Round Reactive to light, Extraocular movements intact,  Conjunctiva without redness, discharge or icterus. Abd: Soft. NTND. BS present. no Masses palpated. No rebound or guarding.  GYN:  External genitalia within normal limits, normal hair distribution, no lesions. Urethral meatus normal, no lesions. Vaginal mucosa pink, moist, normal rugae, no lesions- small laceration superior vaginal introitus- healing. No cystocele or rectocele. cervix without lesions, mild white discharge and blood at cervical os. Bimanual exam revealed normal uterus.  No bladder/suprapubic fullness, masses or tenderness. No cervical motion tenderness. No adnexal fullness. Anus and perineum within normal limits, no lesions.   No exam data present No results found. No results found for this or any previous visit (from the past 24 hour(s)).  Assessment/Plan: Drinda Buttsysha Cumberledge is a 42 y.o. female present  for OV for  Vaginal discharge/Vaginal irritation - discussed all prior STD labs collected in May and June with her today. Explained HSV testing and Igg+, as well as ability to pass on to partners.  -  Wet prep collected today and with G/C/T testing.  - Cervicovaginal ancillary only - Will wait for results to treat.  - Safe sex education.  - f/u with gyn on any additional HSV questions or desire for suppressive therapy etc    Reviewed expectations re: course of current medical issues.  Discussed self-management of symptoms.  Outlined signs and symptoms indicating need for more acute intervention.  Patient verbalized understanding and all questions were answered.  Patient received an After-Visit Summary.    No orders of the defined types were placed in this encounter.   > 25 minutes spent with patient, >50% of time spent face to face counseling    Note is dictated utilizing voice recognition software. Although note has been proof read prior to signing, occasional typographical errors still can be missed. If any questions arise, please do not hesitate to call for verification.   electronically signed by:  Felix Pacinienee Harlie Ragle, DO  [facility] [place]

## 2018-05-07 MED FILL — OMEPRAZOLE 20 MG CAP: 20 | 30 days supply | Qty: 60 | Fill #2

## 2018-05-08 ENCOUNTER — Encounter: Payer: Self-pay | Admitting: Family Medicine

## 2018-05-08 LAB — CERVICOVAGINAL ANCILLARY ONLY
Bacterial vaginitis: POSITIVE — AB
CANDIDA VAGINITIS: NEGATIVE
Chlamydia: NEGATIVE
Neisseria Gonorrhea: NEGATIVE
Trichomonas: NEGATIVE

## 2018-05-11 MED FILL — traZODone HCL 100 MG TABS: 100 | 30 days supply | Qty: 30 | Fill #5

## 2018-05-12 MED ORDER — METRONIDAZOLE 500 MG PO TABS: 500.0000 mg | ORAL_TABLET | Freq: Two times a day (BID) | ORAL | 0 refills | Status: DC

## 2018-05-12 MED FILL — ALPRAZolam 1 MG TABS: 1 | 30 days supply | Qty: 30 | Fill #2

## 2018-05-12 MED FILL — metroNIDAZOLE 500 MG TABS: 500 | 7 days supply | Qty: 14 | Fill #0

## 2018-05-26 ENCOUNTER — Emergency Department (HOSPITAL_COMMUNITY)
Admission: EM | Admit: 2018-05-26 | Discharge: 2018-05-26 | Disposition: A | Discharge status: Home / Self Care | Payer: 59 | Attending: Emergency Medicine | Admitting: Emergency Medicine

## 2018-05-26 ENCOUNTER — Encounter (HOSPITAL_COMMUNITY): Payer: Self-pay | Admitting: *Deleted

## 2018-05-26 ENCOUNTER — Other Ambulatory Visit: Payer: Self-pay

## 2018-05-26 ENCOUNTER — Emergency Department (HOSPITAL_COMMUNITY): Payer: 59

## 2018-05-26 DIAGNOSIS — Y999 Unspecified external cause status: Secondary | ICD-10-CM | POA: Diagnosis not present

## 2018-05-26 DIAGNOSIS — X501XXA Overexertion from prolonged static or awkward postures, initial encounter: Secondary | ICD-10-CM | POA: Diagnosis not present

## 2018-05-26 DIAGNOSIS — I10 Essential (primary) hypertension: Secondary | ICD-10-CM | POA: Insufficient documentation

## 2018-05-26 DIAGNOSIS — Z79899 Other long term (current) drug therapy: Secondary | ICD-10-CM | POA: Insufficient documentation

## 2018-05-26 DIAGNOSIS — S93401A Sprain of unspecified ligament of right ankle, initial encounter: Secondary | ICD-10-CM | POA: Insufficient documentation

## 2018-05-26 DIAGNOSIS — S93401D Sprain of unspecified ligament of right ankle, subsequent encounter: Secondary | ICD-10-CM | POA: Diagnosis not present

## 2018-05-26 DIAGNOSIS — Y939 Activity, unspecified: Secondary | ICD-10-CM | POA: Diagnosis not present

## 2018-05-26 DIAGNOSIS — Y929 Unspecified place or not applicable: Secondary | ICD-10-CM | POA: Insufficient documentation

## 2018-05-26 MED ORDER — OXYCODONE-ACETAMINOPHEN 5-325 MG PO TABS
1.0000 | ORAL_TABLET | Freq: Once | ORAL | Status: AC
  Administered 2018-05-26: 1 via ORAL
  Filled 2018-05-26: qty 1

## 2018-05-26 NOTE — Discharge Instructions (Addendum)
1. Medications: home medications 2. Treatment: Wear ankle brace for at least 2 weeks for stabilization of ankle. Use crutches as needed for comfort. Ice and elevate ankle throughout the day 3. Follow-up: make an appointment with your primary care doctor as needed if pain is not improving in the next week. Return to the ER if you develop persistent numbness, color change of your foot, or any new or concerning symptoms.

## 2018-05-26 NOTE — ED Provider Notes (Signed)
[facility] EMERGENCY DEPARTMENT Provider Note   CSN: [id number] Arrival date & time: 05/26/18  0551     History   Chief Complaint Chief Complaint  Patient presents with  . Foot Pain    HPI Toni [name] is a 42 y.o. female presenting for evaluation of right ankle pain.  Patient states that last night she went to sit off the couch and inverted her right ankle.  She used ice with mild improvement in the swelling, but reports her ankle is still throbbing.  She took 2 of her pain pills, Percocet, last night with mild improvement of her pain.  She denies any Tylenol or ibuprofen this morning.  She denies injury elsewhere.  She denies numbness or tingling of the foot.  Pain is of the dorsolateral foot/ankle.  Pain is constant, worse with palpation and movement.  Nothing makes it better.  No radiation of the pain.  HPI  Past Medical History:  Diagnosis Date  . Allergic rhinitis   . 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 Anxiety and depression    started with PPD  . Chronic neck pain    Myofascial pain syndrome per [place] [facility], oxycodone per their office pain med contract.  (Normal cervical MRI 2010 per ortho notes).  . Dietary iron deficiency without anemia   . Easy bruisability 2011   Hx of: saw Hematologist (Dr. Cyndie Chime) and w/u neg for von Willebrand desease.  Marland Kitchen GERD (gastroesophageal reflux disease)    hx gastric ulcer and upper GI bleed, ? NSAID induced  . History of PCR DNA positive for HSV1   . History of PCR DNA positive for HSV2   . History of stomach ulcers   . Hypertension   . 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 Menorrhagia 03/2018   GYN started Femynor 0.25-0.35  . Migraine   . Reactive hypoglycemia    Dr. Everardo All  . Seizure (HCC) 2015   s/p eval with neuro, no medication, no events since  . Syncope    with ? seizure in 2015, eval with neruo  . Vitamin B12 deficiency   . Vitamin D deficiency     Patient Active  Problem List   Diagnosis Date Noted  . Vaginal irritation 05/06/2018  . 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 Altered mental status 04/11/2016  . Hypoglycemia 03/21/2016  . Encephalopathy acute 03/21/2016  . Vaginal discharge 02/13/2016  . Visit for preventive health examination 11/24/2015  . Vitamin B12 deficiency 06/05/2015  . Vitamin D deficiency 04/30/2015  . Chronic neck pain 04/11/2015  . Esophageal reflux 04/11/2015  . Obesity 04/11/2015  . Anxiety and depression 04/11/2015  . Migraine     Past Surgical History:  Procedure Laterality Date  . CESAREAN SECTION  2006  . ENDOMETRIAL BIOPSY    . EYE SURGERY Bilateral    2001  . GASTRIC BYPASS  2002  . TONSILLECTOMY AND ADENOIDECTOMY    . tubes in ears both Bilateral   . tubuligation  2006     OB History   None      Home Medications    Prior to Admission medications   Medication Sig Start Date End Date Taking? Authorizing Provider  acetaminophen (TYLENOL) 500 MG tablet Take 1,000 mg by mouth every 6 (six) hours as needed for mild pain, moderate pain or headache.    [provider]  Alcohol Swabs (ALCOHOL WIPES) 70 % PADS Use to clean finger before checking blood sugar,  checks blood sugar 3-4 times daily 02/12/17   McGowen, Maryjean MornPhilip H, MD  ALPRAZolam Prudy Feeler(XANAX) 1 MG tablet TAKE 1/2 TO 1 TABLET BY MOUTH ONCE DAILY AS NEEDED. 03/18/18   McGowen, Maryjean MornPhilip H, MD  Calcium 250 MG CAPS Take 1 capsule by mouth daily.     [provider]  cetirizine (ZYRTEC) 10 MG tablet Take 1 tablet (10 mg total) by mouth daily. 02/21/17   McGowen, Maryjean MornPhilip H, MD  Cholecalciferol (VITAMIN D) 2000 units CAPS Take 1 capsule by mouth daily.    [provider]  DULoxetine (CYMBALTA) 30 MG capsule Take 1 capsule (30 mg total) by mouth daily. 11/25/17   McGowen, Maryjean MornPhilip H, MD  fluticasone (FLONASE) 50 MCG/ACT nasal spray PLACE 2 SPRAYS INTO BOTH NOSTRILS DAILY. 12/02/17   McGowen, Maryjean MornPhilip H, MD  glucose blood test strip Use to check  blood sugar three times daily. 07/08/17   McGowen, Maryjean MornPhilip H, MD  hydrochlorothiazide (HYDRODIURIL) 25 MG tablet TAKE 1 TABLET BY MOUTH DAILY. 07/08/17   McGowen, Maryjean MornPhilip H, MD  metoprolol succinate (TOPROL-XL) 25 MG 24 hr tablet Take 1 tablet (25 mg total) by mouth daily. 06/12/17   McGowen, Maryjean MornPhilip H, MD  metroNIDAZOLE (FLAGYL) 500 MG tablet Take 1 tablet (500 mg total) by mouth 2 (two) times daily. 05/12/18   Jeoffrey MassedMcGowen, Philip H, MD  MONONESSA 0.25-35 MG-MCG tablet Take 1 tablet by mouth once daily 11/17/15   [provider]  montelukast (SINGULAIR) 10 MG tablet Take 1 tablet (10 mg total) by mouth at bedtime. 03/19/17   McGowen, Maryjean MornPhilip H, MD  omeprazole (PRILOSEC) 20 MG capsule TAKE 1 CAPSULE BY MOUTH 2 TIMES DAILY BEFORE A MEAL 03/03/18   McGowen, Maryjean MornPhilip H, MD  oxyCODONE-acetaminophen (PERCOCET) 10-325 MG per tablet Take 1 tablet by mouth every 8 (eight) hours as needed for pain. Take 1 tablet by mouth every 8 hours as needed for severe pain. 04/24/15   Waldon MerlMartin, William C, PA-C  potassium chloride SA (K-DUR,KLOR-CON) 20 MEQ tablet Take 1 tablet (20 mEq total) by mouth daily. 02/09/18   McGowen, Maryjean MornPhilip H, MD  rizatriptan (MAXALT-MLT) 10 MG disintegrating tablet 1 tab po qd as needed for migraine headache.  May repeat dose in 2 hours if needed.  Max dose in 24h is 20 mg. 11/21/17   McGowen, Maryjean MornPhilip H, MD  traZODone (DESYREL) 100 MG tablet Take 1 tablet (100 mg total) by mouth at bedtime. 12/01/17   McGowen, Maryjean MornPhilip H, MD  vitamin B-12 (CYANOCOBALAMIN) 1000 MCG tablet Take 1,000 mcg by mouth daily.    [provider]    Family History Family History  Problem Relation Age of Onset  . Arthritis Mother   . Hyperlipidemia Mother   . Heart disease Mother   . Diabetes Mother   . Asthma Mother   . Aneurysm Father        deceased  . Lung cancer Maternal Aunt        smoker  . Colon cancer Maternal Grandmother   . Lung cancer Maternal Aunt        smoker    Social History Social History    Tobacco Use  . Smoking status: Never Smoker  . Smokeless tobacco: Never Used  Substance Use Topics  . Alcohol use: Yes    Alcohol/week: 0.0 standard drinks    Comment: occasional   . Drug use: No     Allergies   Ibuprofen   Review of Systems Review of Systems  Musculoskeletal: Positive for arthralgias and joint  swelling.  Neurological: Negative for numbness.  Hematological: Does not bruise/bleed easily.     Physical Exam Updated Vital Signs BP (!) 147/92   Pulse 75   Temp 98.3 F (36.8 C)   Resp 16   LMP 05/06/2018 (Exact Date)   SpO2 97%   Physical Exam  Constitutional: She is oriented to person, place, and time. She appears well-developed and well-nourished. No distress.  HENT:  Head: Normocephalic and atraumatic.  Eyes: EOM are normal.  Neck: Normal range of motion.  Pulmonary/Chest: Effort normal.  Abdominal: She exhibits no distension.  Musculoskeletal: She exhibits edema and tenderness.  Minimal swelling of the lateral malleolus with tenderness of the lateral malleolus and at the base of the fifth toe.  No open injury.  Pedal pulses intact bilaterally.  Good cap refill.  Active range of motion of the toes without pain.  Decreased active range of motion of the ankle due to pain.  No tenderness of the lower leg.  Achilles tendon palpable and intact.  Neurological: She is alert and oriented to person, place, and time. No sensory deficit.  Skin: Skin is warm. Capillary refill takes less than 2 seconds. No rash noted.  Psychiatric: She has a normal mood and affect.  Nursing note and vitals reviewed.    ED Treatments / Results  Labs (all labs ordered are listed, but only abnormal results are displayed) Labs Reviewed - No data to display  EKG None  Radiology Dg Ankle Complete Right  Result Date: 05/26/2018 CLINICAL DATA:  Right foot and ankle pain, pain onset after standing up and twisting ankle. Swelling. EXAM: RIGHT ANKLE - COMPLETE 3+ VIEW  COMPARISON:  None. FINDINGS: There is no evidence of fracture, dislocation, or joint effusion. Moderate plantar calcaneal spur. There is no evidence of arthropathy or other focal bone abnormality. Soft tissue calcifications anteriorly consistent with phleboliths. Soft tissue edema laterally. IMPRESSION: Soft tissue edema without acute fracture or subluxation. Electronically Signed   By: Rubye OaksMelanie  Ehinger M.D.   On: 05/26/2018 06:38   Dg Foot Complete Right  Result Date: 05/26/2018 CLINICAL DATA:  Right foot and ankle pain, pain onset after standing up and twisting ankle. Swelling. EXAM: RIGHT FOOT COMPLETE - 3+ VIEW COMPARISON:  None. FINDINGS: There is no evidence of fracture or dislocation. Plantar calcaneal spur. There is no evidence of arthropathy or other focal bone abnormality. Soft tissues are unremarkable. IMPRESSION: No fracture or subluxation of the right foot. Electronically Signed   By: Rubye OaksMelanie  Ehinger M.D.   On: 05/26/2018 06:39    Procedures Procedures (including critical care time)  Medications Ordered in ED Medications  oxyCODONE-acetaminophen (PERCOCET/ROXICET) 5-325 MG per tablet 1 tablet (has no administration in time range)     Initial Impression / Assessment and Plan / ED Course  I have reviewed the triage vital signs and the nursing notes.  Pertinent labs & imaging results that were available during my care of the patient were reviewed by me and considered in my medical decision making (see chart for details).     Patient presenting for evaluation of right ankle pain.  Physical exam reassuring, she is neurovascularly intact.  Mild swelling and tenderness to palpation of the lateral malleolus.  Will obtain x-rays for further evaluation.  X-rays viewed and interpreted by me, no fractures or dislocations.  Discussed with patient.  Discussed treatment with rest, ice, and elevation.  Patient takes Percocet at home for pain, is unable to take NSAIDs.  Discussed she should  continue her  home medications.  Patient requesting further prescription for narcotics, I discussed that at this time there is no indication for new narcotic prescription.  Follow-up with PCP as needed.  Patient requesting ASO and crutches.  At this time, patient appears safe for discharge.  Return precautions given.  Patient states she understands and agrees to plan.   Final Clinical Impressions(s) / ED Diagnoses   Final diagnoses:  Sprain of right ankle, unspecified ligament, initial encounter    ED Discharge Orders    None       Alveria Apley, PA-C 05/26/18 [id number]    Gilda Crease, MD 05/26/18 [phone]

## 2018-05-26 NOTE — ED Notes (Signed)
ED Provider at bedside. 

## 2018-05-26 NOTE — ED Notes (Signed)
Patient advised waiting on crunches to be sent

## 2018-05-26 NOTE — ED Notes (Signed)
Patient refused discharge vitals.

## 2018-05-26 NOTE — ED Notes (Signed)
Patient transported to X-ray 

## 2018-05-26 NOTE — ED Triage Notes (Signed)
Pt stood up wrong last night and twisted her R ankle, mild swelling noted

## 2018-06-09 MED FILL — ALPRAZolam 1 MG TABS: 1 | 30 days supply | Qty: 30 | Fill #3

## 2018-06-13 ENCOUNTER — Telehealth: Payer: 59 | Admitting: Physician Assistant

## 2018-06-13 DIAGNOSIS — B9689 Other specified bacterial agents as the cause of diseases classified elsewhere: Secondary | ICD-10-CM | POA: Diagnosis not present

## 2018-06-13 DIAGNOSIS — N760 Acute vaginitis: Secondary | ICD-10-CM | POA: Diagnosis not present

## 2018-06-13 MED ORDER — METRONIDAZOLE 500 MG PO TABS: 500.0000 mg | ORAL_TABLET | Freq: Two times a day (BID) | ORAL | 0 refills | Status: DC

## 2018-06-13 NOTE — Progress Notes (Signed)

## 2018-06-17 MED FILL — OMEPRAZOLE 20 MG CPDR: 20 | 30 days supply | Qty: 60 | Fill #3

## 2018-06-17 MED FILL — FLUTICASONE PROP 50 MCG SPR: 50 | 30 days supply | Qty: 16 | Fill #2

## 2018-07-13 MED FILL — ALPRAZolam 1 MG TABS: 1 | 30 days supply | Qty: 30 | Fill #4

## 2018-07-17 ENCOUNTER — Encounter: Payer: Self-pay | Admitting: Family Medicine

## 2018-07-17 ENCOUNTER — Other Ambulatory Visit: Payer: Self-pay | Admitting: Physician Assistant

## 2018-07-17 DIAGNOSIS — N760 Acute vaginitis: Principal | ICD-10-CM

## 2018-07-17 DIAGNOSIS — B9689 Other specified bacterial agents as the cause of diseases classified elsewhere: Secondary | ICD-10-CM

## 2018-07-18 ENCOUNTER — Telehealth: Payer: 59 | Admitting: Nurse Practitioner

## 2018-07-18 DIAGNOSIS — B9689 Other specified bacterial agents as the cause of diseases classified elsewhere: Secondary | ICD-10-CM

## 2018-07-18 DIAGNOSIS — B373 Candidiasis of vulva and vagina: Secondary | ICD-10-CM | POA: Diagnosis not present

## 2018-07-18 DIAGNOSIS — N760 Acute vaginitis: Secondary | ICD-10-CM | POA: Diagnosis not present

## 2018-07-18 DIAGNOSIS — B3731 Acute candidiasis of vulva and vagina: Secondary | ICD-10-CM

## 2018-07-18 MED ORDER — FLUCONAZOLE 150 MG PO TABS: 150.0000 mg | ORAL_TABLET | Freq: Once | ORAL | 0 refills | Status: AC

## 2018-07-18 MED ORDER — METRONIDAZOLE 500 MG PO TABS: 500.0000 mg | ORAL_TABLET | Freq: Two times a day (BID) | ORAL | 0 refills | Status: DC

## 2018-07-18 NOTE — Addendum Note (Signed)
Addended by: Bennie Pierini on: 07/18/2018 11:05 AM   Modules accepted: Orders

## 2018-07-18 NOTE — Progress Notes (Signed)

## 2018-07-22 MED FILL — FLUTICASONE PROP 50 MCG SPR: 50 | 30 days supply | Qty: 16 | Fill #3

## 2018-07-22 MED FILL — OMEPRAZOLE 20 MG CPDR: 20 | 30 days supply | Qty: 60 | Fill #4

## 2018-07-22 MED FILL — FEMYNOR 0.25-35 MG-MCG TABS: 0.25-35 | 84 days supply | Qty: 84 | Fill #1

## 2018-07-27 ENCOUNTER — Ambulatory Visit: Payer: 59 | Admitting: Family Medicine

## 2018-07-27 ENCOUNTER — Other Ambulatory Visit: Payer: Self-pay | Admitting: Family Medicine

## 2018-07-27 ENCOUNTER — Encounter: Payer: Self-pay | Admitting: Family Medicine

## 2018-07-27 VITALS — BP 134/82 | HR 75 | Resp 16 | Ht 65.0 in | Wt 224.0 lb

## 2018-07-27 DIAGNOSIS — E538 Deficiency of other specified B group vitamins: Secondary | ICD-10-CM

## 2018-07-27 DIAGNOSIS — F411 Generalized anxiety disorder: Secondary | ICD-10-CM | POA: Diagnosis not present

## 2018-07-27 DIAGNOSIS — I10 Essential (primary) hypertension: Secondary | ICD-10-CM

## 2018-07-27 DIAGNOSIS — E611 Iron deficiency: Secondary | ICD-10-CM

## 2018-07-27 DIAGNOSIS — E559 Vitamin D deficiency, unspecified: Secondary | ICD-10-CM | POA: Diagnosis not present

## 2018-07-27 DIAGNOSIS — K909 Intestinal malabsorption, unspecified: Secondary | ICD-10-CM

## 2018-07-27 LAB — BASIC METABOLIC PANEL
BUN: 12 mg/dL (ref 6–23)
CO2: 27 meq/L (ref 19–32)
Calcium: 9.3 mg/dL (ref 8.4–10.5)
Chloride: 100 mEq/L (ref 96–112)
Creatinine, Ser: 0.9 mg/dL (ref 0.40–1.20)
GFR: 87.98 mL/min (ref >60.00)
Glucose, Bld: 77 mg/dL (ref 70–99)
POTASSIUM: 4.3 meq/L (ref 3.5–5.1)
SODIUM: 136 meq/L (ref 135–145)

## 2018-07-27 LAB — VITAMIN D 25 HYDROXY (VIT D DEFICIENCY, FRACTURES): VITD: 26.41 ng/mL — ABNORMAL LOW (ref 30.00–100.00)

## 2018-07-27 MED ORDER — CYANOCOBALAMIN 1000 MCG/ML IJ SOLN
1000.0000 ug | Freq: Once | INTRAMUSCULAR | Status: AC
  Administered 2018-07-27: 1000 ug via INTRAMUSCULAR

## 2018-07-27 MED ORDER — VITAMIN D (ERGOCALCIFEROL) 1.25 MG (50000 UNIT) PO CAPS: ORAL_CAPSULE | ORAL | 3 refills | Status: DC

## 2018-07-27 NOTE — Progress Notes (Signed)
OFFICE VISIT  07/27/2018   CC:  Chief Complaint  Patient presents with  . Follow-up    RCI    HPI:    Patient is a 42 y.o.  female who presents for 5 mo f/u GAD, hx of depression, HTN, and hx of recurrent symptomatic hypoglycemia--unexplained. Has hx of malabsorption due to roux-en-Y gastric bipass surgery; some postprandial hypo.   GAD/stress: she had come off cymbalta for a couple months and she has felt overstressed again lately, 2 jobs, single mom, worries about everything.  Minimal, if any, feeling of depression.  Has been back on cymbalta for 1 wk. Still taking alpraz once daily.  HTN: not taking med every day.  No home bp measurements.  Her ferritin was on low end of normal last check so I recommended she start a ferrous sulfate tab 325mg  qd but she has not done this, says she did not remember being told to do this.  Vit D a bit low last time, so she is now taking 2 of her 2000U tabs bid.  ROS: no CP, no SOB, no wheezing, no cough, no dizziness, no HAs, no rashes, no melena/hematochezia.  No polyuria or polydipsia.  No myalgias or arthralgias.   Past Medical History:  Diagnosis Date  . Allergic rhinitis   . 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 Anxiety and depression    started with PPD  . Chronic neck pain    Myofascial pain syndrome per [place] [facility], oxycodone per their office pain med contract.  (Normal cervical MRI 2010 per ortho notes).  . Dietary iron deficiency without anemia   . Easy bruisability 2011   Hx of: saw Hematologist (Dr. Cyndie Chime) and w/u neg for von Willebrand desease.  Marland Kitchen GERD (gastroesophageal reflux disease)    hx gastric ulcer and upper GI bleed, ? NSAID induced  . History of PCR DNA positive for HSV1   . History of PCR DNA positive for HSV2   . History of stomach ulcers   . Hypertension   . 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 Menorrhagia 03/2018   GYN started Femynor 0.25-0.35  . Migraine   . Reactive hypoglycemia     Dr. Everardo All  . Seizure (HCC) 2015   s/p eval with neuro, no medication, no events since  . Syncope    with ? seizure in 2015, eval with neruo  . Vitamin B12 deficiency   . Vitamin D deficiency     Past Surgical History:  Procedure Laterality Date  . CESAREAN SECTION  2006  . ENDOMETRIAL BIOPSY    . EYE SURGERY Bilateral    2001  . GASTRIC BYPASS  2002  . TONSILLECTOMY AND ADENOIDECTOMY    . tubes in ears both Bilateral   . tubuligation  2006    Outpatient Medications Prior to Visit  Medication Sig Dispense Refill  . acetaminophen (TYLENOL) 500 MG tablet Take 1,000 mg by mouth every 6 (six) hours as needed for mild pain, moderate pain or headache.    . Alcohol Swabs (ALCOHOL WIPES) 70 % PADS Use to clean finger before checking blood sugar, checks blood sugar 3-4 times daily 100 each 11  . ALPRAZolam (XANAX) 1 MG tablet TAKE 1/2 TO 1 TABLET BY MOUTH ONCE DAILY AS NEEDED. 30 tablet 5  . Calcium 250 MG CAPS Take 1 capsule by mouth daily.     . cetirizine (ZYRTEC) 10 MG tablet Take 1 tablet (10 mg total) by mouth daily. 30  tablet 11  . Cholecalciferol (VITAMIN D) 2000 units CAPS Take 1 capsule by mouth daily.    . DULoxetine (CYMBALTA) 30 MG capsule Take 1 capsule (30 mg total) by mouth daily. 30 capsule 6  . fluticasone (FLONASE) 50 MCG/ACT nasal spray PLACE 2 SPRAYS INTO BOTH NOSTRILS DAILY. 16 g 12  . glucose blood test strip Use to check blood sugar three times daily. 100 each 12  . hydrochlorothiazide (HYDRODIURIL) 25 MG tablet TAKE 1 TABLET BY MOUTH DAILY. 30 tablet 5  . metoprolol succinate (TOPROL-XL) 25 MG 24 hr tablet Take 1 tablet (25 mg total) by mouth daily. 30 tablet 6  . metroNIDAZOLE (FLAGYL) 500 MG tablet Take 1 tablet (500 mg total) by mouth 2 (two) times daily. 14 tablet 0  . MONONESSA 0.25-35 MG-MCG tablet Take 1 tablet by mouth once daily  4  . montelukast (SINGULAIR) 10 MG tablet Take 1 tablet (10 mg total) by mouth at bedtime. 30 tablet 6  . omeprazole  (PRILOSEC) 20 MG capsule TAKE 1 CAPSULE BY MOUTH 2 TIMES DAILY BEFORE A MEAL 60 capsule 11  . oxyCODONE-acetaminophen (PERCOCET) 10-325 MG per tablet Take 1 tablet by mouth every 8 (eight) hours as needed for pain. Take 1 tablet by mouth every 8 hours as needed for severe pain. 15 tablet 0  . potassium chloride SA (K-DUR,KLOR-CON) 20 MEQ tablet Take 1 tablet (20 mEq total) by mouth daily. 30 tablet 6  . rizatriptan (MAXALT-MLT) 10 MG disintegrating tablet 1 tab po qd as needed for migraine headache.  May repeat dose in 2 hours if needed.  Max dose in 24h is 20 mg. 10 tablet 5  . traZODone (DESYREL) 100 MG tablet Take 1 tablet (100 mg total) by mouth at bedtime. 30 tablet 5  . vitamin B-12 (CYANOCOBALAMIN) 1000 MCG tablet Take 1,000 mcg by mouth daily.     Facility-Administered Medications Prior to Visit  Medication Dose Route Frequency Provider Last Rate Last Dose  . cyanocobalamin ((VITAMIN B-12)) injection 1,000 mcg  1,000 mcg Intramuscular Q6 months McGowen, Maryjean Morn, MD   1,000 mcg at 02/06/18 1003    Allergies  Allergen Reactions  . Ibuprofen     Causes ulcers    ROS As per HPI  PE: Blood pressure 134/82, pulse 75, resp. rate 16, height 5\' 5"  (1.651 m), weight 224 lb (101.6 kg), SpO2 98 %. Gen: Alert, well appearing.  Patient is oriented to person, place, time, and situation. AFFECT: pleasant, lucid thought and speech. No further exam today.  LABS:  Lab Results  Component Value Date   TSH 0.74 02/06/2018   Lab Results  Component Value Date   WBC 10.1 02/06/2018   HGB 13.5 02/06/2018   HCT 40.0 02/06/2018   MCV 92.4 02/06/2018   PLT 310.0 02/06/2018   Lab Results  Component Value Date   IRON 162 (H) 10/30/2017   IRON 150 10/30/2017   TIBC 388 10/30/2017   FERRITIN 17.6 02/06/2018   Lab Results  Component Value Date   VITAMINB12 346 10/30/2017    Lab Results  Component Value Date   CREATININE 0.85 02/06/2018   BUN 10 02/06/2018   NA 136 02/06/2018   K 3.4  (L) 02/06/2018   CL 99 02/06/2018   CO2 26 02/06/2018   Lab Results  Component Value Date   ALT 12 02/06/2018   AST 12 02/06/2018   ALKPHOS 41 02/06/2018   BILITOT 0.5 02/06/2018   Lab Results  Component Value Date  CHOL 197 02/06/2018   Lab Results  Component Value Date   HDL 79.80 02/06/2018   Lab Results  Component Value Date   LDLCALC 89 02/06/2018   Lab Results  Component Value Date   TRIG 138.0 02/06/2018   Lab Results  Component Value Date   CHOLHDL 2 02/06/2018   Lab Results  Component Value Date   HGBA1C 4.6 08/07/2017    IMPRESSION AND PLAN:  1) Borderline ferritin 02/2018: Hb was well w/in normal range. She did not get the instructions to start ferrous sulfate. Start 325 FeSO4 qd like we had intended 6 mo ago, recheck iron and cbc in 6 mo.  2) HTN: The current medical regimen is effective;  continue present plan and medications. Check BMET today.. Monitor bp at home, take med daily!  3) Malabsorption syndrome due to Roux-en-Y bipass surgery:  Iron supplement as above in #1. Vit B12 1000 mcg IM today--we have her on q42mo schedule of this.. Check vit D level. She has adjusted dietary habits to avoid much problem with symptomatic hypoglycemia anymore.  4) GAD, hx of recurrent MDD: after taking herself off of cymbalta, she has now been back on it about 1 wk for a flare of her GAD.   She takes aprazolam once daily. Cont cymbalta and alpraz: [facility] UTD, no new rx needed today.  An After Visit Summary was printed and given to the patient.  FOLLOW UP: Return in about 6 months (around 01/26/2019) for annual CPE (fasting).  Signed:  Santiago Bumpers, MD           07/27/2018

## 2018-07-28 MED FILL — VIT D2 1.25 MG (50,000 UNIT: 1.25 MG | 84 days supply | Qty: 24 | Fill #0

## 2018-07-30 ENCOUNTER — Encounter: Payer: Self-pay | Admitting: Family Medicine

## 2018-07-30 ENCOUNTER — Other Ambulatory Visit: Payer: Self-pay | Admitting: Family Medicine

## 2018-07-30 MED ORDER — CETIRIZINE HCL 10 MG PO TABS: 10.0000 mg | ORAL_TABLET | Freq: Every day | ORAL | 3 refills | Status: DC

## 2018-07-30 MED ORDER — MONTELUKAST SODIUM 10 MG PO TABS: 10.0000 mg | ORAL_TABLET | Freq: Every day | ORAL | 5 refills | Status: DC

## 2018-07-30 MED ORDER — HYDROCHLOROTHIAZIDE 25 MG PO TABS: 25.0000 mg | ORAL_TABLET | Freq: Every day | ORAL | 5 refills | Status: DC

## 2018-07-30 MED FILL — traZODone HCL 100 MG TABS: 100 | 30 days supply | Qty: 90 | Fill #0

## 2018-07-30 NOTE — Telephone Encounter (Signed)
RF request for trazodone LOV: 07/27/18 Next ov: 01/11/19 Last written: 12/01/17 #30 w/ 5RF  Please advise. Thanks.

## 2018-07-31 MED FILL — DULoxetine HCL 30 MG CPEP: 30 | 30 days supply | Qty: 30 | Fill #4

## 2018-08-06 MED FILL — MONTELUKAST SOD 10 MG TAB: 10 | 30 days supply | Qty: 30 | Fill #0

## 2018-08-10 ENCOUNTER — Ambulatory Visit: Payer: 59 | Admitting: Family Medicine

## 2018-08-12 MED FILL — HYDROCHLOROTHIAZIDE 25 MG T: 25 | 90 days supply | Qty: 90 | Fill #0

## 2018-08-12 MED FILL — ALPRAZolam 1 MG TABS: 1 | 30 days supply | Qty: 30 | Fill #5

## 2018-08-31 MED FILL — OMEPRAZOLE 20 MG CPDR: 20 | 30 days supply | Qty: 60 | Fill #5

## 2018-08-31 MED FILL — DULoxetine HCL 30 MG CPEP: 30 | 30 days supply | Qty: 30 | Fill #5

## 2018-09-08 ENCOUNTER — Other Ambulatory Visit: Payer: Self-pay | Admitting: Family Medicine

## 2018-09-08 NOTE — Telephone Encounter (Signed)
RF request for alprazolam LOV: 07/27/18 Next ov: 01/11/19 Last written: 03/18/18 #30 w/ 5RF  Please advise. Thanks.

## 2018-09-09 ENCOUNTER — Encounter: Payer: Self-pay | Admitting: Family Medicine

## 2018-09-09 MED FILL — ALPRAZolam 1 MG TABS: 1 | 30 days supply | Qty: 30 | Fill #0

## 2018-09-14 ENCOUNTER — Telehealth: Payer: Self-pay | Admitting: Family Medicine

## 2018-09-14 NOTE — Telephone Encounter (Signed)
Previous FMLA printed and placed with new FMLA form on Dr. Samul DadaMcGowen's desk.

## 2018-09-14 NOTE — Telephone Encounter (Signed)
FMLA paperwork received. In physician's box. Thanks

## 2018-09-17 DIAGNOSIS — Z0279 Encounter for issue of other medical certificate: Secondary | ICD-10-CM

## 2018-09-18 NOTE — Telephone Encounter (Signed)
Completed fax sent 09/18/18

## 2018-09-18 NOTE — Telephone Encounter (Signed)
FMLA form completed and placed in box to go up front.

## 2018-09-21 ENCOUNTER — Encounter: Payer: Self-pay | Admitting: Family Medicine

## 2018-10-08 MED FILL — OMEPRAZOLE 20 MG CPDR: 20 | 30 days supply | Qty: 60 | Fill #6

## 2018-10-08 MED FILL — FEMYNOR 0.25-35 MG-MCG TABS: 0.25-35 | 84 days supply | Qty: 84 | Fill #2

## 2018-10-08 MED FILL — POTASSIUM CHLORIDE CRYS ER: 20 | 30 days supply | Qty: 30 | Fill #2

## 2018-10-08 MED FILL — DULoxetine HCL 30 MG CPEP: 30 | 30 days supply | Qty: 30 | Fill #6

## 2018-10-08 MED FILL — ALPRAZolam 1 MG TABS: 1 | 30 days supply | Qty: 30 | Fill #1

## 2018-10-09 DIAGNOSIS — Z79899 Other long term (current) drug therapy: Secondary | ICD-10-CM | POA: Diagnosis not present

## 2018-10-09 DIAGNOSIS — Z79891 Long term (current) use of opiate analgesic: Secondary | ICD-10-CM | POA: Diagnosis not present

## 2018-10-09 DIAGNOSIS — M542 Cervicalgia: Secondary | ICD-10-CM | POA: Diagnosis not present

## 2018-10-12 DIAGNOSIS — N898 Other specified noninflammatory disorders of vagina: Secondary | ICD-10-CM | POA: Diagnosis not present

## 2018-10-12 DIAGNOSIS — Z113 Encounter for screening for infections with a predominantly sexual mode of transmission: Secondary | ICD-10-CM | POA: Diagnosis not present

## 2018-10-12 DIAGNOSIS — N926 Irregular menstruation, unspecified: Secondary | ICD-10-CM | POA: Diagnosis not present

## 2018-10-12 MED FILL — metroNIDAZOLE 500 MG TABS: 500 | 7 days supply | Qty: 14 | Fill #0

## 2018-10-13 ENCOUNTER — Encounter: Payer: Self-pay | Admitting: Family Medicine

## 2018-10-21 ENCOUNTER — Other Ambulatory Visit (HOSPITAL_COMMUNITY): Payer: Self-pay | Admitting: Obstetrics and Gynecology

## 2018-10-21 DIAGNOSIS — N921 Excessive and frequent menstruation with irregular cycle: Secondary | ICD-10-CM | POA: Diagnosis not present

## 2018-10-21 DIAGNOSIS — N926 Irregular menstruation, unspecified: Secondary | ICD-10-CM

## 2018-10-22 MED FILL — MEDROXYPROGESTERONE 10 MG T: 10 | 90 days supply | Qty: 30 | Fill #0

## 2018-10-25 ENCOUNTER — Encounter: Payer: Self-pay | Admitting: Family Medicine

## 2018-10-30 ENCOUNTER — Other Ambulatory Visit: Payer: Self-pay | Admitting: Family Medicine

## 2018-10-30 MED FILL — VIT D2 1.25 MG (50,000 UNIT: 1.25 MG | 84 days supply | Qty: 24 | Fill #1

## 2018-10-30 MED FILL — traZODone HCL 100 MG TABS: 100 | 30 days supply | Qty: 90 | Fill #1

## 2018-11-02 MED FILL — DULoxetine HCL 30 MG CPEP: 30 | 30 days supply | Qty: 30 | Fill #0

## 2018-11-02 MED FILL — OMEPRAZOLE 20 MG CPDR: 20 | 30 days supply | Qty: 60 | Fill #7

## 2018-11-04 MED FILL — RIZATRIPTAN BENZOATE 10 MG: 10 | 30 days supply | Qty: 10 | Fill #1

## 2018-11-05 MED FILL — ALPRAZolam 1 MG TABS: 1 | 30 days supply | Qty: 30 | Fill #2

## 2018-12-03 ENCOUNTER — Other Ambulatory Visit: Payer: Self-pay | Admitting: Family Medicine

## 2018-12-03 MED FILL — OMEPRAZOLE 20 MG CPDR: 20 | 30 days supply | Qty: 60 | Fill #8 | Status: TO

## 2018-12-03 MED FILL — FLUTICASONE PROP 50 MCG SPR: 50 | 30 days supply | Qty: 16 | Fill #0

## 2018-12-03 MED FILL — DULoxetine HCL 30 MG CPEP: 30 | 30 days supply | Qty: 30 | Fill #1 | Status: TO

## 2018-12-03 MED FILL — ALPRAZolam 1 MG TABS: 1 | 30 days supply | Qty: 30 | Fill #3 | Status: TO

## 2018-12-07 ENCOUNTER — Telehealth: Payer: 59 | Admitting: Family

## 2018-12-07 DIAGNOSIS — J019 Acute sinusitis, unspecified: Secondary | ICD-10-CM

## 2018-12-07 DIAGNOSIS — B9689 Other specified bacterial agents as the cause of diseases classified elsewhere: Secondary | ICD-10-CM

## 2018-12-07 MED ORDER — FLUCONAZOLE 150 MG PO TABS: 150.0000 mg | ORAL_TABLET | Freq: Once | ORAL | 0 refills | Status: AC

## 2018-12-07 MED ORDER — AMOXICILLIN-POT CLAVULANATE 875-125 MG PO TABS: 1.0000 | ORAL_TABLET | Freq: Two times a day (BID) | ORAL | 0 refills | Status: DC

## 2018-12-07 NOTE — Progress Notes (Signed)
We are sorry that you are not feeling well.  Here is how we plan to help!  Based on what you have shared with me it looks like you have sinusitis.  Sinusitis is inflammation and infection in the sinus cavities of the head.  Based on your presentation I believe you most likely have Acute Bacterial Sinusitis.  This is an infection caused by bacteria and is treated with antibiotics. I have prescribed Augmentin 875mg /125mg  one tablet twice daily with food, for 7 days. I also sent in Diflucan. You may use an oral decongestant such as Mucinex D or if you have glaucoma or high blood pressure use plain Mucinex. Saline nasal spray help and can safely be used as often as needed for congestion.  If you develop worsening sinus pain, fever or notice severe headache and vision changes, or if symptoms are not better after completion of antibiotic, please schedule an appointment with a health care provider.    Sinus infections are not as easily transmitted as other respiratory infection, however we still recommend that you avoid close contact with loved ones, especially the very young and elderly.  Remember to wash your hands thoroughly throughout the day as this is the number one way to prevent the spread of infection!  Home Care:  Only take medications as instructed by your medical team.  Complete the entire course of an antibiotic.  Do not take these medications with alcohol.  A steam or ultrasonic humidifier can help congestion.  You can place a towel over your head and breathe in the steam from hot water coming from a faucet.  Avoid close contacts especially the very young and the elderly.  Cover your mouth when you cough or sneeze.  Always remember to wash your hands.  Get Help Right Away If:  You develop worsening fever or sinus pain.  You develop a severe head ache or visual changes.  Your symptoms persist after you have completed your treatment plan.  Make sure you  Understand these  instructions.  Will watch your condition.  Will get help right away if you are not doing well or get worse.  Your e-visit answers were reviewed by a board certified advanced clinical practitioner to complete your personal care plan.  Depending on the condition, your plan could have included both over the counter or prescription medications.  If there is a problem please reply  once you have received a response from your provider.  Your safety is important to [place].  If you have drug allergies check your prescription carefully.    You can use MyChart to ask questions about today's visit, request a non-urgent call back, or ask for a work or school excuse for 24 hours related to this e-Visit. If it has been greater than 24 hours you will need to follow up with your provider, or enter a new e-Visit to address those concerns.  You will get an e-mail in the next two days asking about your experience.  I hope that your e-visit has been valuable and will speed your recovery. Thank you for using e-visits.

## 2018-12-28 ENCOUNTER — Telehealth: Payer: Self-pay

## 2018-12-28 ENCOUNTER — Encounter: Payer: Self-pay | Admitting: Family Medicine

## 2018-12-28 MED ORDER — ALPRAZOLAM 1 MG PO TABS: ORAL_TABLET | ORAL | 0 refills | Status: DC

## 2018-12-28 MED FILL — ALPRAZolam 1 MG TABS: 1 | 30 days supply | Qty: 30 | Fill #0

## 2018-12-28 MED FILL — FEMYNOR 0.25-35 MG-MCG TABS: 0.25-35 | 84 days supply | Qty: 84 | Fill #0

## 2018-12-28 MED FILL — MONTELUKAST SOD 10 MG TAB: 10 | 30 days supply | Qty: 30 | Fill #0

## 2018-12-28 MED FILL — OMEPRAZOLE 20 MG CPDR: 20 | 30 days supply | Qty: 60 | Fill #0

## 2018-12-28 NOTE — Telephone Encounter (Signed)
Pt notified that RX was sent to pharmacy.  

## 2018-12-28 NOTE — Telephone Encounter (Signed)
Pt is asking for refill of xanax be sent to [facility] long pharmacy because [facility] is no longer open besides for mail order RX's.   Please advise

## 2018-12-28 NOTE — Telephone Encounter (Signed)
OK, alpraz eRx'd 

## 2018-12-29 MED FILL — DULoxetine HCL 30 MG CPEP: 30 | 30 days supply | Qty: 30 | Fill #0

## 2019-01-04 ENCOUNTER — Ambulatory Visit (INDEPENDENT_AMBULATORY_CARE_PROVIDER_SITE_OTHER): Payer: Self-pay | Admitting: Nurse Practitioner

## 2019-01-04 VITALS — BP 140/100 | HR 70 | Temp 98.7°F | Resp 14 | Wt 241.2 lb

## 2019-01-04 DIAGNOSIS — B379 Candidiasis, unspecified: Secondary | ICD-10-CM

## 2019-01-04 MED ORDER — FLUCONAZOLE 150 MG PO TABS: 150.0000 mg | ORAL_TABLET | Freq: Once | ORAL | 0 refills | Status: AC

## 2019-01-04 NOTE — Patient Instructions (Signed)
Vaginal Yeast infection, Adult   -Take medication as prescribed. -Eat more yogurt. This may help to keep your yeast infection from returning. -Try taking a sitz bath to help with discomfort. This is a warm water bath that is taken while you are sitting down. The water should only come up to your hips and should cover your buttocks. Do this 3-4 times per day or as told by your health care provider. -Do not have sex until your health care provider approves. Tell your sex partner that you have a yeast infection. That person should go to his or her health care provider and ask if they should also be treated. -Do not wear tight clothes, such as pantyhose or tight pants. Wear breathable cotton underwear -Follow up with your PCP if symptoms do not improve.  Vaginal yeast infection is a condition that causes vaginal discharge as well as soreness, swelling, and redness (inflammation) of the vagina. This is a common condition. Some women get this infection frequently. What are the causes? This condition is caused by a change in the normal balance of the yeast (candida) and bacteria that live in the vagina. This change causes an overgrowth of yeast, which causes the inflammation. What increases the risk? The condition is more likely to develop in women who:  Take antibiotic medicines.  Have diabetes.  Take birth control pills.  Are pregnant.  Douche often.  Have a weak body defense system (immune system).  Have been taking steroid medicines for a long time.  Frequently wear tight clothing. What are the signs or symptoms? Symptoms of this condition include:  White, thick, creamy vaginal discharge.  Swelling, itching, redness, and irritation of the vagina. The lips of the vagina (vulva) may be affected as well.  Pain or a burning feeling while urinating.  Pain during sex. How is this diagnosed? This condition is diagnosed based on:  Your medical history.  A physical exam.  A pelvic  exam. Your health care provider will examine a sample of your vaginal discharge under a microscope. Your health care provider may send this sample for testing to confirm the diagnosis. How is this treated? This condition is treated with medicine. Medicines may be over-the-counter or prescription. You may be told to use one or more of the following:  Medicine that is taken by mouth (orally).  Medicine that is applied as a cream (topically).  Medicine that is inserted directly into the vagina (suppository). Follow these instructions at home:  Lifestyle  Do not have sex until your health care provider approves. Tell your sex partner that you have a yeast infection. That person should go to his or her health care provider and ask if they should also be treated.  Do not wear tight clothes, such as pantyhose or tight pants.  Wear breathable cotton underwear. General instructions  Take or apply over-the-counter and prescription medicines only as told by your health care provider.  Eat more yogurt. This may help to keep your yeast infection from returning.  Do not use tampons until your health care provider approves.  Try taking a sitz bath to help with discomfort. This is a warm water bath that is taken while you are sitting down. The water should only come up to your hips and should cover your buttocks. Do this 3-4 times per day or as told by your health care provider.  Do not douche.  If you have diabetes, keep your blood sugar levels under control.  Keep all follow-up visits  as told by your health care provider. This is important. Contact a health care provider if:  You have a fever.  Your symptoms go away and then return.  Your symptoms do not get better with treatment.  Your symptoms get worse.  You have new symptoms.  You develop blisters in or around your vagina.  You have blood coming from your vagina and it is not your menstrual period.  You develop pain in your  abdomen. Summary  Vaginal yeast infection is a condition that causes discharge as well as soreness, swelling, and redness (inflammation) of the vagina.  This condition is treated with medicine. Medicines may be over-the-counter or prescription.  Take or apply over-the-counter and prescription medicines only as told by your health care provider.  Do not douche. Do not have sex or use tampons until your health care provider approves.  Contact a health care provider if your symptoms do not get better with treatment or your symptoms go away and then return. This information is not intended to replace advice given to you by your health care provider. Make sure you discuss any questions you have with your health care provider. Document Released: 07/03/2005 Document Revised: 02/09/2018 Document Reviewed: 02/09/2018 Elsevier Interactive Patient Education  2019 [employer].

## 2019-01-04 NOTE — Progress Notes (Signed)
Subjective:     Toni [name] is a 43 y.o. female who presents for evaluation of an abnormal vaginal irritation. Symptoms have been present for 3 days. Vaginal symptoms: burning, local irritation and vulvar itching. Patient informs she was on Augmentin approximately 3 weeks ago, stating, "I normally get a yeast infection after being on abx". Contraception: none. She denies abnormal bleeding, blisters, bumps, discharge, odor, pain, post coital bleeding and urinary symptoms of chills, cloudy urine, dysuria, flank pain bilaterally, hematuria, lower abdominal pain, nausea, sweats, urinary frequency, urinary hesitancy, urinary incontinence, urinary retention, urinary urgency, vomiting and fever. The patient is sexually active with one partner at this time. Sexually transmitted infection risk: very low risk of STD exposure. Menstrual flow: irregular, patient's LMP began on 12/31/2018. The following portions of the patient's history were reviewed and updated as appropriate: allergies, current medications and past medical history.  Past Medical History:  Diagnosis Date  . Allergic rhinitis   . 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 Anxiety and depression    started with PPD  . Chronic neck pain    Myofascial pain syndrome per [facility], oxycodone per their office pain med contract.  (Normal cervical MRI 2010 per ortho notes).  . Dietary iron deficiency without anemia   . Easy bruisability 2011   Hx of: saw Hematologist (Dr. Cyndie Chime) and w/u neg for von Willebrand desease.  Marland Kitchen GERD (gastroesophageal reflux disease)    hx gastric ulcer and upper GI bleed, ? NSAID induced  . History of PCR DNA positive for HSV1   . History of PCR DNA positive for HSV2   . History of stomach ulcers   . Hypertension   . Irregular menses 03/2018   GYN started Femynor 0.25-0.35.  [facility] as of 10/2018.  u/s wnl.  Pt declined endom bx.  . 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 Migraine   .  Reactive hypoglycemia    Dr. Everardo All  . Seizure (HCC) 2015   s/p eval with neuro, no medication, no events since  . Syncope    with ? seizure in 2015, eval with neruo  . Vitamin B12 deficiency   . Vitamin D deficiency     Current Outpatient Medications:  .  Calcium 250 MG CAPS, Take 1 capsule by mouth daily. , Disp: , Rfl:  .  cetirizine (ZYRTEC) 10 MG tablet, Take 1 tablet (10 mg total) by mouth daily., Disp: 90 tablet, Rfl: 3 .  DULoxetine (CYMBALTA) 30 MG capsule, TAKE 1 CAPSULE (30 MG TOTAL) BY MOUTH DAILY., Disp: 30 capsule, Rfl: 4 .  fluticasone (FLONASE) 50 MCG/ACT nasal spray, PLACE 2 SPRAYS INTO BOTH NOSTRILS DAILY., Disp: 16 g, Rfl: 12 .  glucose blood test strip, Use to check blood sugar three times daily., Disp: 100 each, Rfl: 12 .  hydrochlorothiazide (HYDRODIURIL) 25 MG tablet, Take 1 tablet (25 mg total) by mouth daily., Disp: 30 tablet, Rfl: 5 .  metoprolol succinate (TOPROL-XL) 25 MG 24 hr tablet, Take 1 tablet (25 mg total) by mouth daily., Disp: 30 tablet, Rfl: 6 .  MONONESSA 0.25-35 MG-MCG tablet, Take 1 tablet by mouth once daily, Disp: , Rfl: 4 .  montelukast (SINGULAIR) 10 MG tablet, Take 1 tablet (10 mg total) by mouth at bedtime., Disp: 30 tablet, Rfl: 5 .  omeprazole (PRILOSEC) 20 MG capsule, TAKE 1 CAPSULE BY MOUTH 2 TIMES DAILY BEFORE A MEAL, Disp: 60 capsule, Rfl: 11 .  oxyCODONE-acetaminophen (PERCOCET) 10-325 MG per tablet,  Take 1 tablet by mouth every 8 (eight) hours as needed for pain. Take 1 tablet by mouth every 8 hours as needed for severe pain., Disp: 15 tablet, Rfl: 0 .  potassium chloride SA (K-DUR,KLOR-CON) 20 MEQ tablet, Take 1 tablet (20 mEq total) by mouth daily., Disp: 30 tablet, Rfl: 6 .  traZODone (DESYREL) 100 MG tablet, TAKE 1 TABLET BY MOUTH AT BEDTIME., Disp: 90 tablet, Rfl: 3 .  vitamin B-12 (CYANOCOBALAMIN) 1000 MCG tablet, Take 1,000 mcg by mouth daily., Disp: , Rfl:  .  acetaminophen (TYLENOL) 500 MG tablet, Take 1,000 mg by mouth every 6  (six) hours as needed for mild pain, moderate pain or headache., Disp: , Rfl:  .  Alcohol Swabs (ALCOHOL WIPES) 70 % PADS, Use to clean finger before checking blood sugar, checks blood sugar 3-4 times daily, Disp: 100 each, Rfl: 11 .  ALPRAZolam (XANAX) 1 MG tablet, 1 tab po qd prn anxiety (Patient not taking: Reported on 01/04/2019), Disp: 30 tablet, Rfl: 0 .  amoxicillin-clavulanate (AUGMENTIN) 875-125 MG tablet, Take 1 tablet by mouth 2 (two) times daily. (Patient not taking: Reported on 01/04/2019), Disp: 14 tablet, Rfl: 0 .  fluconazole (DIFLUCAN) 150 MG tablet, Take 1 tablet (150 mg total) by mouth once for 1 dose. May repeat every 72 hours up to 2 additional doses., Disp: 3 tablet, Rfl: 0 .  rizatriptan (MAXALT-MLT) 10 MG disintegrating tablet, 1 tab po qd as needed for migraine headache.  May repeat dose in 2 hours if needed.  Max dose in 24h is 20 mg. (Patient not taking: Reported on 01/04/2019), Disp: 10 tablet, Rfl: 5 .  Vitamin D, Ergocalciferol, (DRISDOL) 50000 units CAPS capsule, 1 tab po twice per week, Disp: 24 capsule, Rfl: 3  Current Facility-Administered Medications:  .  cyanocobalamin ((VITAMIN B-12)) injection 1,000 mcg, 1,000 mcg, Intramuscular, Q6 months, McGowen, Maryjean Morn, MD, 1,000 mcg at 02/06/18 1003   Review of Systems Constitutional: negative Eyes: negative Ears, nose, mouth, throat, and face: negative Respiratory: negative Cardiovascular: negative Gastrointestinal: negative Genitourinary:positive for abnormal menstrual periods and vaginal itching, burning, OCP for menstrual irregularity, negative for vaginal discharge, decreased stream, dysuria, frequency, hematuria, hesitancy, nocturia and urinary incontinence Neurological: negative    Objective: Blood pressure (!) 140/100, pulse 70, temperature 98.7 F (37.1 C), resp. rate 14, weight 241 lb 3.2 oz (109.4 kg), SpO2 96 %.   Physical Exam Vitals signs reviewed.  Constitutional:      General: She is not in acute  distress. HENT:     Head: Normocephalic.     Right Ear: Tympanic membrane, ear canal and external ear normal.     Left Ear: Tympanic membrane, ear canal and external ear normal.     Nose: Nose normal.     Mouth/Throat:     Mouth: Mucous membranes are moist.     Pharynx: No oropharyngeal exudate or posterior oropharyngeal erythema.  Eyes:     Conjunctiva/sclera: Conjunctivae normal.     Pupils: Pupils are equal, round, and reactive to light.  Neck:     Musculoskeletal: Normal range of motion and neck supple.  Cardiovascular:     Rate and Rhythm: Normal rate and regular rhythm.     Pulses: Normal pulses.     Heart sounds: Normal heart sounds.  Pulmonary:     Effort: Pulmonary effort is normal. No respiratory distress.     Breath sounds: Normal breath sounds. No stridor. No wheezing, rhonchi or rales.  Abdominal:     General: Bowel  sounds are normal.     Palpations: Abdomen is soft.     Tenderness: There is no abdominal tenderness. There is no right CVA tenderness, left CVA tenderness, guarding or rebound.  Genitourinary:    Comments: Vaginal exam was deferred due to patient being on her menstrual cycle Musculoskeletal: Normal range of motion.  Skin:    General: Skin is warm and dry.     Capillary Refill: Capillary refill takes less than 2 seconds.  Neurological:     General: No focal deficit present.     Mental Status: She is alert and oriented to person, place, and time.  Psychiatric:        Mood and Affect: Mood normal.        Behavior: Behavior normal.     Assessment and Plan:   Exam findings, diagnosis etiology and medication use and indications reviewed with patient. Follow- Up and discharge instructions provided. No emergent/urgent issues found on exam.  Based on the patient's HPI and symptom presentation, will treat for a yeast infection.  Patient denies vaginal discharge, odor, or foul-smelling discharge which would rule out BV.  Patient also presents as low-risk for  STD and did not have any other symptoms such as warts, dyspareunia, or known STD/STI exposure. Patient felt her recent abx use may have contributed to her symptoms, but onset of symptoms was late.    Patient also did not have any concern for UTI at this time. Recommended several symptomatic treatment options for the patient to continue at home.  Patient is well-appearing at this time, although her BP is elevated.  Patient informs she has not taken her BP meds today.  Informed her to take them when she gets home. Patient education was provided. Patient verbalized understanding of information provided and agrees with plan of care (POC), all questions answered. The patient is advised to call or return to clinic if condition does not see an improvement in symptoms, or to seek the care of the closest emergency department if condition worsens with the above plan.    1. Yeast infection  - fluconazole (DIFLUCAN) 150 MG tablet; Take 1 tablet (150 mg total) by mouth once for 1 dose. May repeat every 72 hours up to 2 additional doses.  Dispense: 3 tablet; Refill: 0 -Take medication as prescribed. -Eat more yogurt. This may help to keep your yeast infection from returning. -Try taking a sitz bath to help with discomfort. This is a warm water bath that is taken while you are sitting down. The water should only come up to your hips and should cover your buttocks. Do this 3-4 times per day or as told by your health care provider. -Do not have sex until your health care provider approves. Tell your sex partner that you have a yeast infection. That person should go to his or her health care provider and ask if they should also be treated. -Do not wear tight clothes, such as pantyhose or tight pants. Wear breathable cotton underwear -Follow up with your PCP if symptoms do not improve.

## 2019-01-06 ENCOUNTER — Telehealth: Payer: Self-pay

## 2019-01-06 NOTE — Telephone Encounter (Signed)
Patient states she still have some discharge and odor, she will take the second dose of medication tomorrow 01/07/2019, and if the symptoms do not improve she will get a culture.

## 2019-01-08 ENCOUNTER — Telehealth: Payer: 59 | Admitting: Family

## 2019-01-08 DIAGNOSIS — N760 Acute vaginitis: Secondary | ICD-10-CM

## 2019-01-08 MED ORDER — METRONIDAZOLE 0.75 % VA GEL: 1.0000 | Freq: Every day | VAGINAL | 0 refills | Status: AC

## 2019-01-08 NOTE — Progress Notes (Signed)
We are sorry that you are not feeling well. Here is how we plan to help! Based on what you shared with me it looks like you: May have a vaginosis due to bacteria  Vaginosis is an inflammation of the vagina that can result in discharge, itching and pain. The cause is usually a change in the normal balance of vaginal bacteria or an infection. Vaginosis can also result from reduced estrogen levels after menopause.  The most common causes of vaginosis are:   Bacterial vaginosis which results from an overgrowth of one on several organisms that are normally present in your vagina.   Yeast infections which are caused by a naturally occurring fungus called candida.   Vaginal atrophy (atrophic vaginosis) which results from the thinning of the vagina from reduced estrogen levels after menopause.   Trichomoniasis which is caused by a parasite and is commonly transmitted by sexual intercourse.  Factors that increase your risk of developing vaginosis include: Marland Kitchen Medications, such as antibiotics and steroids . Uncontrolled diabetes . Use of hygiene products such as bubble bath, vaginal spray or vaginal deodorant . Douching . Wearing damp or tight-fitting clothing . Using an intrauterine device (IUD) for birth control . Hormonal changes, such as those associated with pregnancy, birth control pills or menopause . Sexual activity . Having a sexually transmitted infection  Your treatment plan is Metrogel vaginal suppository nightly x 5 nights  Be sure to take all of the medication as directed. Stop taking any medication if you develop a rash, tongue swelling or shortness of breath. Mothers who are breast feeding should consider pumping and discarding their breast milk while on these antibiotics. However, there is no consensus that infant exposure at these doses would be harmful.  Remember that medication creams can weaken latex condoms. Marland Kitchen   HOME CARE:  Good hygiene may prevent some types of vaginosis  from recurring and may relieve some symptoms:  . Avoid baths, hot tubs and whirlpool spas. Rinse soap from your outer genital area after a shower, and dry the area well to prevent irritation. Don't use scented or harsh soaps, such as those with deodorant or antibacterial action. Marland Kitchen Avoid irritants. These include scented tampons and pads. . Wipe from front to back after using the toilet. Doing so avoids spreading fecal bacteria to your vagina.  Other things that may help prevent vaginosis include:  Marland Kitchen Don't douche. Your vagina doesn't require cleansing other than normal bathing. Repetitive douching disrupts the normal organisms that reside in the vagina and can actually increase your risk of vaginal infection. Douching won't clear up a vaginal infection. . Use a latex condom. Both female and female latex condoms may help you avoid infections spread by sexual contact. . Wear cotton underwear. Also wear pantyhose with a cotton crotch. If you feel comfortable without it, skip wearing underwear to bed. Yeast thrives in [employer] Your symptoms should improve in the next day or two.  GET HELP RIGHT AWAY IF:  . You have pain in your lower abdomen ( pelvic area or over your ovaries) . You develop nausea or vomiting . You develop a fever . Your discharge changes or worsens . You have persistent pain with intercourse . You develop shortness of breath, a rapid pulse, or you faint.  These symptoms could be signs of problems or infections that need to be evaluated by a medical provider now.  MAKE SURE YOU    Understand these instructions.  Will watch your condition.  Will get  help right away if you are not doing well or get worse.  Your e-visit answers were reviewed by a board certified advanced clinical practitioner to complete your personal care plan. Depending upon the condition, your plan could have included both over the counter or prescription medications. Please review your pharmacy  choice to make sure that you have choses a pharmacy that is open for you to pick up any needed prescription, Your safety is important to [place]. If you have drug allergies check your prescription carefully.   You can use MyChart to ask questions about today's visit, request a non-urgent call back, or ask for a work or school excuse for 24 hours related to this e-Visit. If it has been greater than 24 hours you will need to follow up with your provider, or enter a new e-Visit to address those concerns. You will get a MyChart message within the next two days asking about your experience. I hope that your e-visit has been valuable and will speed your recovery.

## 2019-01-11 ENCOUNTER — Encounter: Payer: 59 | Admitting: Family Medicine

## 2019-01-24 ENCOUNTER — Telehealth: Payer: 59 | Admitting: Nurse Practitioner

## 2019-01-24 DIAGNOSIS — N3000 Acute cystitis without hematuria: Secondary | ICD-10-CM | POA: Diagnosis not present

## 2019-01-24 MED ORDER — CEPHALEXIN 500 MG PO CAPS: 500.0000 mg | ORAL_CAPSULE | Freq: Two times a day (BID) | ORAL | 0 refills | Status: DC

## 2019-01-24 NOTE — Progress Notes (Signed)

## 2019-01-25 MED FILL — ALPRAZolam 1 MG TABS: 1 | 30 days supply | Qty: 30 | Fill #1

## 2019-01-25 MED FILL — VALACYCLOVIR HCL 500 MG TAB: 500 | 15 days supply | Qty: 30 | Fill #0

## 2019-01-27 ENCOUNTER — Telehealth: Payer: Self-pay

## 2019-01-27 NOTE — Telephone Encounter (Signed)
My Chart message sent

## 2019-01-28 NOTE — Telephone Encounter (Signed)
MyChart message read regarding appt.

## 2019-02-01 ENCOUNTER — Other Ambulatory Visit: Payer: Self-pay | Admitting: Family Medicine

## 2019-02-01 ENCOUNTER — Encounter: Payer: Self-pay | Admitting: Physician Assistant

## 2019-02-01 ENCOUNTER — Encounter: Payer: Self-pay | Admitting: Family Medicine

## 2019-02-01 ENCOUNTER — Telehealth: Payer: 59 | Admitting: Physician Assistant

## 2019-02-01 DIAGNOSIS — N760 Acute vaginitis: Secondary | ICD-10-CM

## 2019-02-01 DIAGNOSIS — B9689 Other specified bacterial agents as the cause of diseases classified elsewhere: Secondary | ICD-10-CM | POA: Diagnosis not present

## 2019-02-01 MED ORDER — METRONIDAZOLE 500 MG PO TABS: 500.0000 mg | ORAL_TABLET | Freq: Two times a day (BID) | ORAL | 0 refills | Status: DC

## 2019-02-01 MED FILL — METOPROLOL SUCCINATE ER 25: 25 | 30 days supply | Qty: 30 | Fill #0

## 2019-02-01 MED FILL — DULoxetine HCL 30 MG CPEP: 30 | 30 days supply | Qty: 30 | Fill #1

## 2019-02-01 MED FILL — MONTELUKAST SOD 10 MG TAB: 10 | 30 days supply | Qty: 30 | Fill #1

## 2019-02-01 NOTE — Progress Notes (Signed)

## 2019-02-08 MED FILL — OMEPRAZOLE 20 MG CPDR: 20 | 30 days supply | Qty: 60 | Fill #1

## 2019-02-13 NOTE — Progress Notes (Signed)
Greater than 5 minutes, yet less than 10 minutes of time have been spent researching, coordinating, and implementing care for this patient today.  Thank you for the details you included in the comment boxes. Those details are very helpful in determining the best course of treatment for you and help us to provide the best care.  

## 2019-02-17 ENCOUNTER — Telehealth: Payer: Self-pay | Admitting: Family Medicine

## 2019-02-17 NOTE — Telephone Encounter (Signed)
Patient declined virtual visit. Patient states she will do an e-visit

## 2019-02-17 NOTE — Telephone Encounter (Signed)
Pt needs to be scheduled for a virtual visit

## 2019-02-17 NOTE — Telephone Encounter (Signed)
Patient is requesting to come in Friday 02/19/19 at 11:00 for a vaginal yeast infection. She is off work so it's a good time for her. Patient would like to see a female for her issue.

## 2019-02-19 ENCOUNTER — Encounter: Payer: 59 | Admitting: Family Medicine

## 2019-02-19 DIAGNOSIS — M542 Cervicalgia: Secondary | ICD-10-CM | POA: Diagnosis not present

## 2019-02-22 MED FILL — ALPRAZolam 1 MG TABS: 1 | 30 days supply | Qty: 30 | Fill #0

## 2019-03-02 MED FILL — DULoxetine HCL 30 MG CPEP: 30 | 30 days supply | Qty: 30 | Fill #2

## 2019-03-05 MED FILL — FEMYNOR 0.25-35 MG-MCG TABS: 0.25-35 | 84 days supply | Qty: 84 | Fill #1

## 2019-03-09 ENCOUNTER — Other Ambulatory Visit: Payer: Self-pay | Admitting: Family Medicine

## 2019-03-09 ENCOUNTER — Telehealth: Payer: Self-pay

## 2019-03-09 MED FILL — OMEPRAZOLE 20 MG CPDR: 20 | 30 days supply | Qty: 60 | Fill #0

## 2019-03-09 NOTE — Telephone Encounter (Signed)
Please assist patient with scheduling, thanks. Copied from CRM [id number]. Topic: General - Other >> Mar 09, 2019  7:40 AM Percival Spanish wrote:  Pt would like to reschedule  her CPE that was cancel in May

## 2019-03-09 NOTE — Telephone Encounter (Signed)
Patient scheduled for 04/01/19

## 2019-03-18 ENCOUNTER — Other Ambulatory Visit: Payer: Self-pay | Admitting: Family Medicine

## 2019-03-19 NOTE — Telephone Encounter (Signed)
RF request for Alprazolam LOV: 07/27/18, f/u [facility] Next ov: 04/01/19 CPE Last written: 12/28/18 (30,0) Last CSC: 12/22/17 w/ UDS: 02/06/18  SW pt and states she is still taking.Please advise, thanks. Medication pending

## 2019-03-22 MED FILL — ALPRAZolam 1 MG TABS: 1 | 30 days supply | Qty: 30 | Fill #0

## 2019-03-29 ENCOUNTER — Encounter: Payer: Self-pay | Admitting: Family Medicine

## 2019-03-30 ENCOUNTER — Encounter (HOSPITAL_COMMUNITY): Payer: Self-pay | Admitting: Emergency Medicine

## 2019-03-30 ENCOUNTER — Emergency Department (HOSPITAL_COMMUNITY)
Admission: EM | Admit: 2019-03-30 | Discharge: 2019-03-30 | Disposition: A | Discharge status: Home / Self Care | Payer: 59 | Attending: Emergency Medicine | Admitting: Emergency Medicine

## 2019-03-30 ENCOUNTER — Emergency Department (HOSPITAL_COMMUNITY)
Admission: EM | Admit: 2019-03-30 | Discharge: 2019-03-30 | Discharge status: Absent without leave | Payer: 59 | Source: Home / Self Care

## 2019-03-30 ENCOUNTER — Other Ambulatory Visit: Payer: Self-pay

## 2019-03-30 DIAGNOSIS — R51 Headache: Secondary | ICD-10-CM | POA: Diagnosis not present

## 2019-03-30 DIAGNOSIS — R110 Nausea: Secondary | ICD-10-CM | POA: Diagnosis not present

## 2019-03-30 DIAGNOSIS — H538 Other visual disturbances: Secondary | ICD-10-CM | POA: Insufficient documentation

## 2019-03-30 DIAGNOSIS — Z5321 Procedure and treatment not carried out due to patient leaving prior to being seen by health care provider: Secondary | ICD-10-CM | POA: Diagnosis not present

## 2019-03-30 NOTE — ED Notes (Signed)
Pt asked how long to go back, explained to pt that we cannot give times. States she wants to go to [employer] [facility]. Armband and stickers removed, pt LWBS

## 2019-03-30 NOTE — ED Triage Notes (Signed)
Pt in with c/o sharp "pressure" frontal HA, began suddenly last night before bed and awoke this am with worsened pain. States some blurred vision and nausea. Hx of migraines, takes Rizatriptan. Dad with hx of brain aneurysm

## 2019-03-31 ENCOUNTER — Encounter (HOSPITAL_BASED_OUTPATIENT_CLINIC_OR_DEPARTMENT_OTHER): Payer: Self-pay | Admitting: Emergency Medicine

## 2019-03-31 ENCOUNTER — Other Ambulatory Visit: Payer: Self-pay

## 2019-03-31 ENCOUNTER — Emergency Department (HOSPITAL_BASED_OUTPATIENT_CLINIC_OR_DEPARTMENT_OTHER)
Admission: EM | Admit: 2019-03-31 | Discharge: 2019-03-31 | Disposition: A | Discharge status: Home / Self Care | Payer: 59 | Attending: Emergency Medicine | Admitting: Emergency Medicine

## 2019-03-31 DIAGNOSIS — I10 Essential (primary) hypertension: Secondary | ICD-10-CM | POA: Insufficient documentation

## 2019-03-31 DIAGNOSIS — R51 Headache: Secondary | ICD-10-CM | POA: Diagnosis present

## 2019-03-31 DIAGNOSIS — Z20828 Contact with and (suspected) exposure to other viral communicable diseases: Secondary | ICD-10-CM | POA: Insufficient documentation

## 2019-03-31 DIAGNOSIS — Z79899 Other long term (current) drug therapy: Secondary | ICD-10-CM | POA: Insufficient documentation

## 2019-03-31 DIAGNOSIS — G43409 Hemiplegic migraine, not intractable, without status migrainosus: Secondary | ICD-10-CM | POA: Insufficient documentation

## 2019-03-31 LAB — CBC WITH DIFFERENTIAL/PLATELET
Abs Immature Granulocytes: 0.01 10*3/uL (ref 0.00–0.07)
Basophils Absolute: 0.1 10*3/uL (ref 0.0–0.1)
Basophils Relative: 1 %
Eosinophils Absolute: 0.1 10*3/uL (ref 0.0–0.5)
Eosinophils Relative: 1 %
HCT: 44 % (ref 36.0–46.0)
Hemoglobin: 13.9 g/dL (ref 12.0–15.0)
Immature Granulocytes: 0 %
Lymphocytes Relative: 50 %
Lymphs Abs: 3.7 10*3/uL (ref 0.7–4.0)
MCH: 29.1 pg (ref 26.0–34.0)
MCHC: 31.6 g/dL (ref 30.0–36.0)
MCV: 92.1 fL (ref 80.0–100.0)
Monocytes Absolute: 0.6 10*3/uL (ref 0.1–1.0)
Monocytes Relative: 8 %
Neutro Abs: 2.9 10*3/uL (ref 1.7–7.7)
Neutrophils Relative %: 40 %
Platelets: 332 10*3/uL (ref 150–400)
RBC: 4.78 MIL/uL (ref 3.87–5.11)
RDW: 14.6 % (ref 11.5–15.5)
WBC: 7.3 10*3/uL (ref 4.0–10.5)
nRBC: 0 % (ref 0.0–0.2)

## 2019-03-31 LAB — BASIC METABOLIC PANEL
Anion gap: 8 (ref 5–15)
BUN: 19 mg/dL (ref 6–20)
CO2: 23 mmol/L (ref 22–32)
Calcium: 8.9 mg/dL (ref 8.9–10.3)
Chloride: 107 mmol/L (ref 98–111)
Creatinine, Ser: 0.72 mg/dL (ref 0.44–1.00)
GFR calc Af Amer: >60 mL/min (ref >60)
GFR calc non Af Amer: >60 mL/min (ref >60)
Glucose, Bld: 95 mg/dL (ref 70–99)
Potassium: 3.5 mmol/L (ref 3.5–5.1)
Sodium: 138 mmol/L (ref 135–145)

## 2019-03-31 MED ORDER — DIPHENHYDRAMINE HCL 50 MG/ML IJ SOLN
25.0000 mg | Freq: Once | INTRAMUSCULAR | Status: AC
  Administered 2019-03-31: 25 mg via INTRAVENOUS
  Filled 2019-03-31: qty 1

## 2019-03-31 MED ORDER — SODIUM CHLORIDE 0.9 % IV BOLUS
1000.0000 mL | Freq: Once | INTRAVENOUS | Status: AC
  Administered 2019-03-31: 02:00:00 1000 mL via INTRAVENOUS

## 2019-03-31 MED ORDER — METOCLOPRAMIDE HCL 5 MG/ML IJ SOLN
10.0000 mg | Freq: Once | INTRAMUSCULAR | Status: AC
  Administered 2019-03-31: 02:00:00 10 mg via INTRAVENOUS
  Filled 2019-03-31: qty 2

## 2019-03-31 MED ORDER — KETOROLAC TROMETHAMINE 15 MG/ML IJ SOLN
15.0000 mg | Freq: Once | INTRAMUSCULAR | Status: AC
  Administered 2019-03-31: 15 mg via INTRAVENOUS
  Filled 2019-03-31: qty 1

## 2019-03-31 NOTE — ED Triage Notes (Signed)
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

## 2019-03-31 NOTE — ED Provider Notes (Signed)
[facility] Provider Note: Toni DellJ. Lane Chidiebere Wynn, MD, FACEP  CSN: [id number] MRN: [id number] ARRIVAL: 03/31/19 at 0051 ROOM: MH09/MH09   CHIEF COMPLAINT  Headache   HISTORY OF PRESENT ILLNESS  03/31/19 1:04 AM Toni [name] is a 43 y.o. female history of migraines.  She is here with a headache since yesterday.  The onset was gradual.  The headache is located frontally and is described as pounding and pressure-like.  She rates it as a 9 out of 10.  She states it feels different than her usual migraines.  She is not aware of photophobia and denies associated nausea or vomiting.  She has had no respiratory symptoms such as nasal congestion, cough or shortness of breath.    Past Medical History:  Diagnosis Date  . Allergic rhinitis   . 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 Anxiety and depression    started with PPD  . Chronic neck pain    Myofascial pain syndrome per [place] [facility], oxycodone per their office pain med contract.  (Normal cervical MRI 2010 per ortho notes).  . Dietary iron deficiency without anemia   . Easy bruisability 2011   Hx of: saw Hematologist (Dr. Cyndie ChimeGranfortuna) and w/u neg for von Willebrand desease.  Marland Kitchen. GERD (gastroesophageal reflux disease)    hx gastric ulcer and upper GI bleed, ? NSAID induced  . History of PCR DNA positive for HSV1   . History of PCR DNA positive for HSV2   . History of stomach ulcers   . Hypertension   . Irregular menses 03/2018   GYN started Femynor 0.25-0.35.  [facility] as of 10/2018.  u/s wnl.  Pt declined endom bx.  . 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 Migraine   . Reactive hypoglycemia    Dr. Everardo AllEllison  . Seizure ([facility]) 2015   s/p eval with neuro, no medication, no events since  . Syncope    with ? seizure in 2015, eval with neruo  . Vitamin B12 deficiency   . Vitamin D deficiency     Past Surgical History:  Procedure Laterality Date  . CESAREAN SECTION  2006  . ENDOMETRIAL BIOPSY    .  EYE SURGERY Bilateral    2001  . GASTRIC BYPASS  2002  . TONSILLECTOMY AND ADENOIDECTOMY    . tubes in ears both Bilateral   . tubuligation  2006    Family History  Problem Relation Age of Onset  . Arthritis Mother   . Hyperlipidemia Mother   . Heart disease Mother   . Diabetes Mother   . Asthma Mother   . Aneurysm Father        deceased  . Lung cancer Maternal Aunt        smoker  . Colon cancer Maternal Grandmother   . Lung cancer Maternal Aunt        smoker    Social History   Tobacco Use  . Smoking status: Never Smoker  . Smokeless tobacco: Never Used  Substance Use Topics  . Alcohol use: Yes    Alcohol/week: 0.0 standard drinks    Comment: occasional   . Drug use: No    Prior to Admission medications   Medication Sig Start Date End Date Taking? Authorizing Provider  acetaminophen (TYLENOL) 500 MG tablet Take 1,000 mg by mouth every 6 (six) hours as needed for mild pain, moderate pain or headache.    [provider]  Alcohol Swabs (ALCOHOL WIPES) 70 %  PADS Use to clean finger before checking blood sugar, checks blood sugar 3-4 times daily 02/12/17   McGowen, Adrian Blackwater, MD  ALPRAZolam Duanne Moron) 1 MG tablet TAKE 1 TABLET BY MOUTH ONCE DAILY AS NEEDED FOR ANXIETY 03/20/19   McGowen, Adrian Blackwater, MD  amoxicillin-clavulanate (AUGMENTIN) 875-125 MG tablet Take 1 tablet by mouth 2 (two) times daily. Patient not taking: Reported on 01/04/2019 12/07/18   Dutch Quint B, FNP  Calcium 250 MG CAPS Take 1 capsule by mouth daily.     [provider]  cephALEXin (KEFLEX) 500 MG capsule Take 1 capsule (500 mg total) by mouth 2 (two) times daily. 01/24/19   Hassell Done Mary-Margaret, FNP  cetirizine (ZYRTEC) 10 MG tablet Take 1 tablet (10 mg total) by mouth daily. 07/30/18   McGowen, Adrian Blackwater, MD  DULoxetine (CYMBALTA) 30 MG capsule TAKE 1 CAPSULE (30 MG TOTAL) BY MOUTH DAILY. 10/30/18   McGowen, Adrian Blackwater, MD  fluticasone (FLONASE) 50 MCG/ACT nasal spray PLACE 2 SPRAYS INTO BOTH  NOSTRILS DAILY. 12/03/18   McGowen, Adrian Blackwater, MD  glucose blood test strip Use to check blood sugar three times daily. 07/08/17   McGowen, Adrian Blackwater, MD  hydrochlorothiazide (HYDRODIURIL) 25 MG tablet Take 1 tablet (25 mg total) by mouth daily. 07/30/18   McGowen, Adrian Blackwater, MD  metoprolol succinate (TOPROL-XL) 25 MG 24 hr tablet TAKE 1 TABLET BY MOUTH DAILY. 02/01/19   McGowen, Adrian Blackwater, MD  metroNIDAZOLE (FLAGYL) 500 MG tablet Take 1 tablet (500 mg total) by mouth 2 (two) times daily. 02/01/19   Waldon Merl, PA-C  MONONESSA 0.25-35 MG-MCG tablet Take 1 tablet by mouth once daily 11/17/15   [provider]  montelukast (SINGULAIR) 10 MG tablet Take 1 tablet (10 mg total) by mouth at bedtime. 07/30/18   McGowen, Adrian Blackwater, MD  omeprazole (PRILOSEC) 20 MG capsule TAKE 1 CAPSULE BY MOUTH 2 TIMES DAILY BEFORE A MEAL. OFFICE VISIT NEEDED 03/09/19   McGowen, Adrian Blackwater, MD  oxyCODONE-acetaminophen (PERCOCET) 10-325 MG per tablet Take 1 tablet by mouth every 8 (eight) hours as needed for pain. Take 1 tablet by mouth every 8 hours as needed for severe pain. 04/24/15   Brunetta Jeans, PA-C  potassium chloride SA (K-DUR,KLOR-CON) 20 MEQ tablet Take 1 tablet (20 mEq total) by mouth daily. 02/09/18   McGowen, Adrian Blackwater, MD  rizatriptan (MAXALT-MLT) 10 MG disintegrating tablet 1 tab po qd as needed for migraine headache.  May repeat dose in 2 hours if needed.  Max dose in 24h is 20 mg. Patient not taking: Reported on 01/04/2019 11/21/17   Tammi Sou, MD  traZODone (DESYREL) 100 MG tablet TAKE 1 TABLET BY MOUTH AT BEDTIME. 07/30/18   McGowen, Adrian Blackwater, MD  vitamin B-12 (CYANOCOBALAMIN) 1000 MCG tablet Take 1,000 mcg by mouth daily.    [provider]  Vitamin D, Ergocalciferol, (DRISDOL) 50000 units CAPS capsule 1 tab po twice per week 07/27/18   McGowen, Adrian Blackwater, MD    Allergies Ibuprofen   REVIEW OF SYSTEMS  Negative except as noted here or in the History of Present Illness.   PHYSICAL  EXAMINATION  Initial Vital Signs Blood pressure (!) 155/101, pulse 82, temperature 98.1 F (36.7 C), temperature source Oral, resp. rate 16, height 5\' 6"  (1.676 m), weight 106.6 kg, last menstrual period 03/25/2019, SpO2 100 %.  Examination General: Well-developed, well-nourished female in no acute distress; appearance consistent with age of record HENT: normocephalic; atraumatic Eyes: pupils equal, round and reactive to  light; extraocular muscles intact; photophobia Neck: supple Heart: regular rate and rhythm Lungs: clear to auscultation bilaterally Abdomen: soft; nondistended; nontender; bowel sounds present Extremities: No deformity; full range of motion; pulses normal Neurologic: Awake, alert and oriented; motor function intact in all extremities and symmetric; no facial droop Skin: Warm and dry Psychiatric: Flat affect   RESULTS  Summary of this visit's results, reviewed by myself:   EKG Interpretation  Date/Time:    Ventricular Rate:    PR Interval:    QRS Duration:   QT Interval:    QTC Calculation:   R Axis:     Text Interpretation:        Laboratory Studies: Results for orders placed or performed during the hospital encounter of 03/31/19 (from the past 24 hour(s))  CBC with Differential/Platelet     Status: None   Collection Time: 03/31/19  1:24 AM  Result Value Ref Range   WBC 7.3 4.0 - 10.5 K/uL   RBC 4.78 3.87 - 5.11 MIL/uL   Hemoglobin 13.9 12.0 - 15.0 g/dL   HCT 16.144.0 09.636.0 - 04.546.0 %   MCV 92.1 80.0 - 100.0 fL   MCH 29.1 26.0 - 34.0 pg   MCHC 31.6 30.0 - 36.0 g/dL   RDW 40.914.6 81.111.5 - 91.415.5 %   Platelets 332 150 - 400 K/uL   nRBC 0.0 0.0 - 0.2 %   Neutrophils Relative % 40 %   Neutro Abs 2.9 1.7 - 7.7 K/uL   Lymphocytes Relative 50 %   Lymphs Abs 3.7 0.7 - 4.0 K/uL   Monocytes Relative 8 %   Monocytes Absolute 0.6 0.1 - 1.0 K/uL   Eosinophils Relative 1 %   Eosinophils Absolute 0.1 0.0 - 0.5 K/uL   Basophils Relative 1 %   Basophils Absolute 0.1 0.0 -  0.1 K/uL   Immature Granulocytes 0 %   Abs Immature Granulocytes 0.01 0.00 - 0.07 K/uL  Basic metabolic panel     Status: None   Collection Time: 03/31/19  1:24 AM  Result Value Ref Range   Sodium 138 135 - 145 mmol/L   Potassium 3.5 3.5 - 5.1 mmol/L   Chloride 107 98 - 111 mmol/L   CO2 23 22 - 32 mmol/L   Glucose, Bld 95 70 - 99 mg/dL   BUN 19 6 - 20 mg/dL   Creatinine, Ser 7.820.72 0.44 - 1.00 mg/dL   Calcium 8.9 8.9 - 95.610.3 mg/dL   GFR calc non Af Amer >60 >60 mL/min   GFR calc Af Amer >60 >60 mL/min   Anion gap 8 5 - 15   Imaging Studies: No results found.  ED COURSE and MDM  Nursing notes and initial vitals signs, including pulse oximetry, reviewed.  Vitals:   03/31/19 0100 03/31/19 0103  BP:  (!) 155/101  Pulse:  82  Resp:  16  Temp:  98.1 F (36.7 C)  TempSrc:  Oral  SpO2:  100%  Weight: 106.6 kg   Height: 5\' 6"  (1.676 m)    Toni [name] was evaluated in Emergency Department on 03/31/2019 for the symptoms described in the history of present illness. She was evaluated in the context of the global COVID-19 pandemic, which necessitated consideration that the patient might be at risk for infection with the SARS-CoV-2 virus that causes COVID-19. Institutional protocols and algorithms that pertain to the evaluation of patients at risk for COVID-19 are in a state of rapid change based on information released by regulatory bodies including the CDC  and federal and state organizations. These policies and algorithms were followed during the patient's care in the ED.  2:31 AM Patient's headache improved after IV fluids and medications.  COVID-19 test sent as headache is sometimes the only presenting complaint with COVID infections.   PROCEDURES    ED DIAGNOSES     ICD-10-CM   1. Sporadic migraine  G43.409        Elsworth Ledin, MD 03/31/19 [phone]

## 2019-04-01 ENCOUNTER — Ambulatory Visit (INDEPENDENT_AMBULATORY_CARE_PROVIDER_SITE_OTHER): Payer: 59 | Admitting: Family Medicine

## 2019-04-01 ENCOUNTER — Other Ambulatory Visit: Payer: Self-pay | Admitting: Family Medicine

## 2019-04-01 ENCOUNTER — Encounter: Payer: Self-pay | Admitting: Family Medicine

## 2019-04-01 VITALS — BP 113/81 | HR 88 | Temp 98.2°F | Resp 14 | Ht 65.0 in | Wt 248.8 lb

## 2019-04-01 DIAGNOSIS — H5213 Myopia, bilateral: Secondary | ICD-10-CM | POA: Diagnosis not present

## 2019-04-01 DIAGNOSIS — I10 Essential (primary) hypertension: Secondary | ICD-10-CM

## 2019-04-01 DIAGNOSIS — K909 Intestinal malabsorption, unspecified: Secondary | ICD-10-CM

## 2019-04-01 DIAGNOSIS — Z0000 Encounter for general adult medical examination without abnormal findings: Secondary | ICD-10-CM | POA: Diagnosis not present

## 2019-04-01 DIAGNOSIS — E559 Vitamin D deficiency, unspecified: Secondary | ICD-10-CM

## 2019-04-01 DIAGNOSIS — R51 Headache: Secondary | ICD-10-CM | POA: Diagnosis not present

## 2019-04-01 DIAGNOSIS — Z79899 Other long term (current) drug therapy: Secondary | ICD-10-CM | POA: Diagnosis not present

## 2019-04-01 DIAGNOSIS — F3341 Major depressive disorder, recurrent, in partial remission: Secondary | ICD-10-CM

## 2019-04-01 DIAGNOSIS — F411 Generalized anxiety disorder: Secondary | ICD-10-CM

## 2019-04-01 DIAGNOSIS — H52223 Regular astigmatism, bilateral: Secondary | ICD-10-CM | POA: Diagnosis not present

## 2019-04-01 LAB — NOVEL CORONAVIRUS, NAA (HOSP ORDER, SEND-OUT TO REF LAB; TAT 18-24 HRS): SARS-CoV-2, NAA: NOT DETECTED

## 2019-04-01 MED ORDER — ALPRAZOLAM 1 MG PO TABS: ORAL_TABLET | ORAL | 5 refills | Status: DC

## 2019-04-01 MED ORDER — RIZATRIPTAN BENZOATE 10 MG PO TBDP: ORAL_TABLET | ORAL | 5 refills | Status: DC

## 2019-04-01 MED ORDER — METOPROLOL SUCCINATE ER 50 MG PO TB24: 50.0000 mg | ORAL_TABLET | Freq: Every day | ORAL | 3 refills | Status: DC

## 2019-04-01 MED FILL — RIZATRIPTAN 10 MG ODT: 10 | 30 days supply | Qty: 10 | Fill #0

## 2019-04-01 MED FILL — VIT D2 1.25 MG (50,000 UNIT: 1.25 MG | 84 days supply | Qty: 24 | Fill #2

## 2019-04-01 MED FILL — METOPROLOL SUCCINATE ER 50: 50 | 90 days supply | Qty: 90 | Fill #0

## 2019-04-01 NOTE — Progress Notes (Signed)
Office Note 04/01/2019  CC:  Chief Complaint  Patient presents with  . Annual Exam    pt is not fasting   HPI:  Toni [name] is a 43 y.o. White female who is here for annual health maintenance exam.  Has significant chronic anxiety and takes alprazolam bid and it helps.  She has used this med responsibly.  Most recent fill 03/20/19 via review of PMP AWARE today. She is due for renewal of her CSC and UDS today.  She took TWO of her toprol xl 25mg  tabs per day lately to try to help with her chronic HA syndrome and says it has been helping with this and bp's have also been wnl.  She went to the ED yesterday for pounding HA in forehead.  I reviewed this entire record today.  Says not like her usual migraine HA.  No photo/phonophobia, no nausea, no blurry vision, no aura.   BP was 155/101, HR 82.   Exam unremarkable except affect was flat.  CBC and CMET normal.  No imaging was done.  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 Covid 19 test not resulted yet.  She was given IVF and "migraine cocktail" (I cannot find specific med in charting) and her HA improved.  After ED visit her systolic bp was 517, diastolic 80.  She had not been monitoring her bp prior to all this happening. She and the ED MD think her HA was likely from uncontrolled HTN.   Past Medical History:  Diagnosis Date  . Allergic rhinitis   . 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 Anxiety and depression    started with PPD  . Chronic neck pain    Myofascial pain syndrome per [place] [facility], oxycodone per their office pain med contract.  (Normal cervical MRI 2010 per ortho notes).  . Dietary iron deficiency without anemia   . Easy bruisability 2011   Hx of: saw Hematologist (Dr. Beryle Beams) and w/u neg for von Willebrand desease.  Marland Kitchen GERD (gastroesophageal reflux disease)    hx gastric ulcer and upper GI  bleed, ? NSAID induced  . History of PCR DNA positive for HSV1   . History of PCR DNA positive for HSV2   . History of stomach ulcers   . Hypertension   . Irregular menses 03/2018   GYN started Femynor 0.25-0.35.  [facility] as of 10/2018.  u/s wnl.  Pt declined endom bx.  . 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 Migraine   . Reactive hypoglycemia    Dr. Loanne Drilling  . Seizure ([place]) 2015   s/p eval with neuro, no medication, no events since  . Syncope    with ? seizure in 2015, eval with neruo  . Vitamin B12 deficiency   . Vitamin D deficiency     Past Surgical History:  Procedure Laterality Date  . CESAREAN SECTION  2006  . ENDOMETRIAL BIOPSY    . EYE SURGERY Bilateral    2001  . GASTRIC BYPASS  2002  . TONSILLECTOMY AND ADENOIDECTOMY    . tubes in ears both Bilateral   . tubuligation  2006    Family History  Problem Relation Age of Onset  . Arthritis Mother   . Hyperlipidemia Mother   . Heart disease Mother   . Diabetes Mother   . Asthma Mother   . Aneurysm  Father        deceased  . Lung cancer Maternal Aunt        smoker  . Colon cancer Maternal Grandmother   . Lung cancer Maternal Aunt        smoker    Social History   Socioeconomic History  . Marital status: Legally Separated    Spouse name: Not on file  . Number of children: Not on file  . Years of education: Not on file  . Highest education level: Not on file  Occupational History  . Not on file  Social Needs  . Financial resource strain: Not on file  . Food insecurity    Worry: Not on file    Inability: Not on file  . Transportation needs    Medical: Not on file    Non-medical: Not on file  Tobacco Use  . Smoking status: Never Smoker  . Smokeless tobacco: Never Used  Substance and Sexual Activity  . Alcohol use: Yes    Alcohol/week: 0.0 standard drinks    Comment: occasional   . Drug use: No  . Sexual activity: Not on file  Lifestyle  . Physical activity    Days per  week: Not on file    Minutes per session: Not on file  . Stress: Not on file  Relationships  . Social Musicianconnections    Talks on phone: Not on file    Gets together: Not on file    Attends religious service: Not on file    Active member of club or organization: Not on file    Attends meetings of clubs or organizations: Not on file    Relationship status: Not on file  . Intimate partner violence    Fear of current or ex partner: Not on file    Emotionally abused: Not on file    Physically abused: Not on file    Forced sexual activity: Not on file  Other Topics Concern  . Not on file  Social History Narrative   Married.   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   No T/A/Ds.    Outpatient Medications Prior to Visit  Medication Sig Dispense Refill  . acetaminophen (TYLENOL) 500 MG tablet Take 1,000 mg by mouth every 6 (six) hours as needed for mild pain, moderate pain or headache.    . Alcohol Swabs (ALCOHOL WIPES) 70 % PADS Use to clean finger before checking blood sugar, checks blood sugar 3-4 times daily 100 each 11  . ALPRAZolam (XANAX) 1 MG tablet TAKE 1 TABLET BY MOUTH ONCE DAILY AS NEEDED FOR ANXIETY 30 tablet 0  . Calcium 250 MG CAPS Take 1 capsule by mouth daily.     . cetirizine (ZYRTEC) 10 MG tablet Take 1 tablet (10 mg total) by mouth daily. 90 tablet 3  . DULoxetine (CYMBALTA) 30 MG capsule TAKE 1 CAPSULE (30 MG TOTAL) BY MOUTH DAILY. 30 capsule 4  . fluticasone (FLONASE) 50 MCG/ACT nasal spray PLACE 2 SPRAYS INTO BOTH NOSTRILS DAILY. 16 g 12  . glucose blood test strip Use to check blood sugar three times daily. 100 each 12  . hydrochlorothiazide (HYDRODIURIL) 25 MG tablet Take 1 tablet (25 mg total) by mouth daily. 30 tablet 5  . MONONESSA 0.25-35 MG-MCG tablet Take 1 tablet by mouth once daily  4  . montelukast (SINGULAIR) 10 MG tablet Take 1 tablet (10 mg total) by mouth at bedtime. 30 tablet 5  . omeprazole (PRILOSEC) 20 MG capsule TAKE 1  CAPSULE BY MOUTH 2 TIMES  DAILY BEFORE A MEAL. OFFICE VISIT NEEDED 60 capsule 2  . potassium chloride SA (K-DUR,KLOR-CON) 20 MEQ tablet Take 1 tablet (20 mEq total) by mouth daily. 30 tablet 6  . vitamin B-12 (CYANOCOBALAMIN) 1000 MCG tablet Take 1,000 mcg by mouth daily.    . Vitamin D, Ergocalciferol, (DRISDOL) 50000 units CAPS capsule 1 tab po twice per week 24 capsule 3  . metoprolol succinate (TOPROL-XL) 25 MG 24 hr tablet TAKE 1 TABLET BY MOUTH DAILY. 30 tablet 0  . traZODone (DESYREL) 100 MG tablet TAKE 1 TABLET BY MOUTH AT BEDTIME. (Patient not taking: Reported on 04/01/2019) 90 tablet 3  . metroNIDAZOLE (FLAGYL) 500 MG tablet Take 1 tablet (500 mg total) by mouth 2 (two) times daily. 14 tablet 0   Facility-Administered Medications Prior to Visit  Medication Dose Route Frequency Provider Last Rate Last Dose  . cyanocobalamin ((VITAMIN B-12)) injection 1,000 mcg  1,000 mcg Intramuscular Q6 months McGowen, Maryjean MornPhilip H, MD   1,000 mcg at 02/06/18 1003    Allergies  Allergen Reactions  . Ibuprofen     Causes ulcers    ROS Review of Systems  Constitutional: Negative for appetite change, chills, fatigue and fever.  HENT: Negative for congestion, dental problem, ear pain and sore throat.   Eyes: Negative for discharge, redness and visual disturbance.  Respiratory: Negative for cough, chest tightness, shortness of breath and wheezing.   Cardiovascular: Negative for chest pain, palpitations and leg swelling.  Gastrointestinal: Negative for abdominal pain, blood in stool, diarrhea, nausea and vomiting.  Genitourinary: Negative for difficulty urinating, dysuria, flank pain, frequency, hematuria and urgency.  Musculoskeletal: Negative for arthralgias, back pain, joint swelling, myalgias and neck stiffness.  Skin: Negative for pallor and rash.  Neurological: Positive for headaches (mild today). Negative for dizziness, speech difficulty and weakness.  Hematological: Negative for adenopathy. Does not bruise/bleed easily.   Psychiatric/Behavioral: Negative for confusion and sleep disturbance. The patient is not nervous/anxious.     PE; Blood pressure 113/81, pulse 88, temperature 98.2 F (36.8 C), temperature source Temporal, resp. rate 14, height 5\' 5"  (1.651 m), weight 248 lb 12.8 oz (112.9 kg), last menstrual period 03/25/2019, SpO2 99 %. Exam chaperoned by Emi HolesBrittanae Staton, CMA. Gen: Alert, well appearing.  Patient is oriented to person, place, time, and situation. AFFECT: pleasant, lucid thought and speech. ENT: Ears: EACs clear, normal epithelium.  TMs with good light reflex and landmarks bilaterally.  Eyes: no injection, icteris, swelling, or exudate.  EOMI, PERRLA. Nose: no drainage or turbinate edema/swelling.  No injection or focal lesion.  Mouth: lips without lesion/swelling.  Oral mucosa pink and moist.  Dentition intact and without obvious caries or gingival swelling.  Oropharynx without erythema, exudate, or swelling.  Neck: supple/nontender.  No LAD, mass, or TM.  Carotid pulses 2+ bilaterally, without bruits. CV: RRR, no m/r/g.   LUNGS: CTA bilat, nonlabored resps, good aeration in all lung fields. ABD: soft, NT, ND, BS normal.  No hepatospenomegaly or mass.  No bruits. EXT: no clubbing, cyanosis, or edema.  Musculoskeletal: no joint swelling, erythema, warmth, or tenderness.  ROM of all joints intact. Skin - no sores or suspicious lesions or rashes or color changes   Pertinent labs:  Lab Results  Component Value Date   TSH 0.74 02/06/2018   Lab Results  Component Value Date   WBC 7.3 03/31/2019   HGB 13.9 03/31/2019   HCT 44.0 03/31/2019   MCV 92.1 03/31/2019   PLT 332 03/31/2019  Lab Results  Component Value Date   CREATININE 0.72 03/31/2019   BUN 19 03/31/2019   NA 138 03/31/2019   K 3.5 03/31/2019   CL 107 03/31/2019   CO2 23 03/31/2019   Lab Results  Component Value Date   ALT 12 02/06/2018   AST 12 02/06/2018   ALKPHOS 41 02/06/2018   BILITOT 0.5 02/06/2018   Lab  Results  Component Value Date   CHOL 197 02/06/2018   Lab Results  Component Value Date   HDL 79.80 02/06/2018   Lab Results  Component Value Date   LDLCALC 89 02/06/2018   Lab Results  Component Value Date   TRIG 138.0 02/06/2018   Lab Results  Component Value Date   CHOLHDL 2 02/06/2018   Lab Results  Component Value Date   HGBA1C 4.6 08/07/2017    ASSESSMENT AND PLAN:   1) HA, likely secondary to uncontrolled HTN.  She also has frequent tension HA's and migraine HA's.  Will continue with toprol xl at the 50mg  qd dosing to see if this persistently helps keep bp better controlled and HA's better. RF'd maxalt today.  2) Anxiety: doing fine on bid alprazolam. RF'd med today. CSC renewed today.  UDS obtained today.  3) Malabsorption syndrome with some episodic hypoglycemia. She eats frequent small meals. I will monitor her vit D level, vit B12 level, iron panel.  4) Health maintenance exam: Reviewed age and gender appropriate health maintenance issues (prudent diet, regular exercise, health risks of tobacco and excessive alcohol, use of seatbelts, fire alarms in home, use of sunscreen).  Also reviewed age and gender appropriate health screening as well as vaccine recommendations. Vaccines: UTD. Labs: recent CBC and BMET wnl.  Ordered hepatic function panel, TSH, vit D, iron panel, and vit B12 level today. Cervical ca screening: has appt with GYN next month to get PAP. Breast ca screening: Due for next mammogram now--to be done via her GYN MD. Colon ca screening: average risk patient= as per latest guidelines, start screening at 45-50 yrs of age.  She got an eye exam earlier today and was told it was normal.  An After Visit Summary was printed and given to the patient.  FOLLOW UP:  Return in about 6 months (around 10/01/2019) for routine chronic illness f/u.  Signed:  Santiago BumpersPhil McGowen, MD           04/01/2019

## 2019-04-01 NOTE — Patient Instructions (Signed)

## 2019-04-02 ENCOUNTER — Other Ambulatory Visit: Payer: Self-pay | Admitting: Family Medicine

## 2019-04-02 ENCOUNTER — Encounter: Payer: Self-pay | Admitting: Family Medicine

## 2019-04-02 LAB — HEPATIC FUNCTION PANEL
ALT: 8 U/L (ref 0–35)
AST: 13 U/L (ref 0–37)
Albumin: 4.1 g/dL (ref 3.5–5.2)
Alkaline Phosphatase: 64 U/L (ref 39–117)
Bilirubin, Direct: 0.1 mg/dL (ref 0.0–0.3)
Total Bilirubin: 0.6 mg/dL (ref 0.2–1.2)
Total Protein: 6.9 g/dL (ref 6.0–8.3)

## 2019-04-02 LAB — VITAMIN D 25 HYDROXY (VIT D DEFICIENCY, FRACTURES): VITD: 35.43 ng/mL (ref 30.00–100.00)

## 2019-04-02 LAB — VITAMIN B12: Vitamin B-12: 194 pg/mL — ABNORMAL LOW (ref 211–911)

## 2019-04-02 LAB — TSH: TSH: 0.73 u[IU]/mL (ref 0.35–4.50)

## 2019-04-02 MED FILL — HYDROCHLOROTHIAZIDE 25 MG T: 25 | 90 days supply | Qty: 90 | Fill #1

## 2019-04-03 LAB — PAIN MGMT, PROFILE 8 W/CONF, U
6 Acetylmorphine: NEGATIVE ng/mL
Alcohol Metabolites: NEGATIVE ng/mL (ref <500)
Alphahydroxyalprazolam: 3188 ng/mL
Alphahydroxymidazolam: NEGATIVE ng/mL
Alphahydroxytriazolam: NEGATIVE ng/mL
Aminoclonazepam: NEGATIVE ng/mL
Amphetamines: NEGATIVE ng/mL
Benzodiazepines: POSITIVE ng/mL
Buprenorphine, Urine: NEGATIVE ng/mL
Cocaine Metabolite: NEGATIVE ng/mL
Codeine: NEGATIVE ng/mL
Creatinine: >300 mg/dL
Ethyl Glucuronide (ETG): NEGATIVE ng/mL
Ethyl Sulfate (ETS): NEGATIVE ng/mL
Hydrocodone: NEGATIVE ng/mL
Hydromorphone: NEGATIVE ng/mL
Hydroxyethylflurazepam: NEGATIVE ng/mL
Lorazepam: NEGATIVE ng/mL
MDMA: NEGATIVE ng/mL
Marijuana Metabolite: NEGATIVE ng/mL
Morphine: NEGATIVE ng/mL
Nordiazepam: NEGATIVE ng/mL
Norhydrocodone: NEGATIVE ng/mL
Noroxycodone: 3982 ng/mL
Opiates: NEGATIVE ng/mL
Oxazepam: 125 ng/mL
Oxidant: NEGATIVE ug/mL
Oxycodone: 630 ng/mL
Oxycodone: POSITIVE ng/mL
Oxymorphone: 1526 ng/mL
Temazepam: NEGATIVE ng/mL
pH: 5.1 (ref 4.5–9.0)

## 2019-04-03 LAB — IRON,TIBC AND FERRITIN PANEL
%SAT: 21 % (calc) (ref 16–45)
Ferritin: 73 ng/mL (ref 16–232)
Iron: 100 ug/dL (ref 40–190)
TIBC: 481 mcg/dL (calc) — ABNORMAL HIGH (ref 250–450)

## 2019-04-04 ENCOUNTER — Other Ambulatory Visit: Payer: Self-pay | Admitting: Family Medicine

## 2019-04-04 ENCOUNTER — Encounter: Payer: Self-pay | Admitting: Family Medicine

## 2019-04-04 MED ORDER — CYANOCOBALAMIN 1000 MCG/ML IJ SOLN: 1000.0000 ug | Freq: Once | INTRAMUSCULAR | 0 refills | Status: AC

## 2019-04-05 ENCOUNTER — Other Ambulatory Visit: Payer: Self-pay

## 2019-04-05 MED FILL — POTASSIUM CHLORIDE CRYS ER: 20 | 90 days supply | Qty: 90 | Fill #0

## 2019-04-05 MED FILL — CYANOCOBALAMIN 1,000 MCG/ML: 1000 | 28 days supply | Qty: 1 | Fill #0

## 2019-04-05 MED FILL — DULoxetine HCL 30 MG CPEP: 30 | 30 days supply | Qty: 30 | Fill #0

## 2019-04-05 NOTE — Telephone Encounter (Signed)
RF request for potassium. Last OV 04/01/2019  Unsure of sending in RX due to last RX date 02/09/2018 # 30 x 6 rfs.  Please advise if patient is to continue med?

## 2019-04-06 ENCOUNTER — Other Ambulatory Visit: Payer: Self-pay | Admitting: Family Medicine

## 2019-04-06 ENCOUNTER — Encounter: Payer: Self-pay | Admitting: Family Medicine

## 2019-04-06 MED ORDER — CYANOCOBALAMIN 1000 MCG/ML IJ SOLN: 1000.0000 ug | INTRAMUSCULAR | 0 refills | Status: DC

## 2019-04-16 ENCOUNTER — Telehealth: Payer: Self-pay | Admitting: Family Medicine

## 2019-04-16 ENCOUNTER — Encounter: Payer: Self-pay | Admitting: Family Medicine

## 2019-04-16 ENCOUNTER — Other Ambulatory Visit: Payer: Self-pay

## 2019-04-16 ENCOUNTER — Ambulatory Visit (INDEPENDENT_AMBULATORY_CARE_PROVIDER_SITE_OTHER): Payer: 59 | Admitting: Family Medicine

## 2019-04-16 DIAGNOSIS — Z124 Encounter for screening for malignant neoplasm of cervix: Secondary | ICD-10-CM | POA: Diagnosis not present

## 2019-04-16 DIAGNOSIS — Z01419 Encounter for gynecological examination (general) (routine) without abnormal findings: Secondary | ICD-10-CM | POA: Diagnosis not present

## 2019-04-16 DIAGNOSIS — N921 Excessive and frequent menstruation with irregular cycle: Secondary | ICD-10-CM | POA: Diagnosis not present

## 2019-04-16 DIAGNOSIS — Z6832 Body mass index (BMI) 32.0-32.9, adult: Secondary | ICD-10-CM | POA: Diagnosis not present

## 2019-04-16 DIAGNOSIS — E538 Deficiency of other specified B group vitamins: Secondary | ICD-10-CM | POA: Diagnosis not present

## 2019-04-16 DIAGNOSIS — Z1231 Encounter for screening mammogram for malignant neoplasm of breast: Secondary | ICD-10-CM | POA: Diagnosis not present

## 2019-04-16 DIAGNOSIS — Z760 Encounter for issue of repeat prescription: Secondary | ICD-10-CM | POA: Diagnosis not present

## 2019-04-16 DIAGNOSIS — Z113 Encounter for screening for infections with a predominantly sexual mode of transmission: Secondary | ICD-10-CM | POA: Diagnosis not present

## 2019-04-16 LAB — HM PAP SMEAR: HM Pap smear: NORMAL

## 2019-04-16 MED ORDER — CYANOCOBALAMIN 1000 MCG/ML IJ SOLN
1000.0000 ug | Freq: Once | INTRAMUSCULAR | Status: AC
  Administered 2019-04-16: 1000 ug via INTRAMUSCULAR

## 2019-04-16 NOTE — Telephone Encounter (Signed)
Okay to continue doing b12 one every 6 months per Dr Anitra Lauth.  Will send patient MyChart message as she requested to let her know.

## 2019-04-16 NOTE — Telephone Encounter (Signed)
Patient states she usually gets Vitamin B12 injection once every 6 months.  She states that the level was so low because it was close to the time she was due for her B12 dose.  She would like to just continue these every 6 months.  Please advise.

## 2019-04-16 NOTE — Telephone Encounter (Signed)
OK. Noted 

## 2019-04-16 NOTE — Progress Notes (Signed)
Patient presented for B12 injection.  Per lab results, patient due for B12 injection every month.   Patient tolerated injection.

## 2019-04-19 MED FILL — ALPRAZolam 1 MG TABS: 1 | 30 days supply | Qty: 30 | Fill #0

## 2019-04-19 NOTE — Progress Notes (Signed)
Pt with vit B12 deficiency.  Agree with vit B12 1000 mcg IM in office today. Signed:  Phil Torie Towle, MD           04/19/2019  

## 2019-04-20 ENCOUNTER — Encounter: Payer: Self-pay | Admitting: Family Medicine

## 2019-04-20 DIAGNOSIS — J309 Allergic rhinitis, unspecified: Secondary | ICD-10-CM | POA: Insufficient documentation

## 2019-04-23 MED FILL — OMEPRAZOLE 20 MG CPDR: 20 | 30 days supply | Qty: 60 | Fill #1

## 2019-04-27 ENCOUNTER — Other Ambulatory Visit: Payer: Self-pay | Admitting: Family Medicine

## 2019-04-29 MED FILL — DULoxetine HCL 30 MG CPEP: 30 | 30 days supply | Qty: 30 | Fill #1

## 2019-05-01 ENCOUNTER — Encounter: Payer: Self-pay | Admitting: Family Medicine

## 2019-05-03 DIAGNOSIS — Z5181 Encounter for therapeutic drug level monitoring: Secondary | ICD-10-CM | POA: Diagnosis not present

## 2019-05-03 DIAGNOSIS — Z79899 Other long term (current) drug therapy: Secondary | ICD-10-CM | POA: Diagnosis not present

## 2019-05-04 ENCOUNTER — Encounter: Payer: Self-pay | Admitting: Family Medicine

## 2019-05-06 IMAGING — DX DG ANKLE COMPLETE 3+V*R*
3 series · 3 of 3 positions shown · non-contrast
Comparison: None.

CLINICAL DATA: Right foot and ankle pain, pain onset after standing
up and twisting ankle. Swelling.

EXAM:
RIGHT ANKLE - COMPLETE 3+ VIEW

[ankle ap]
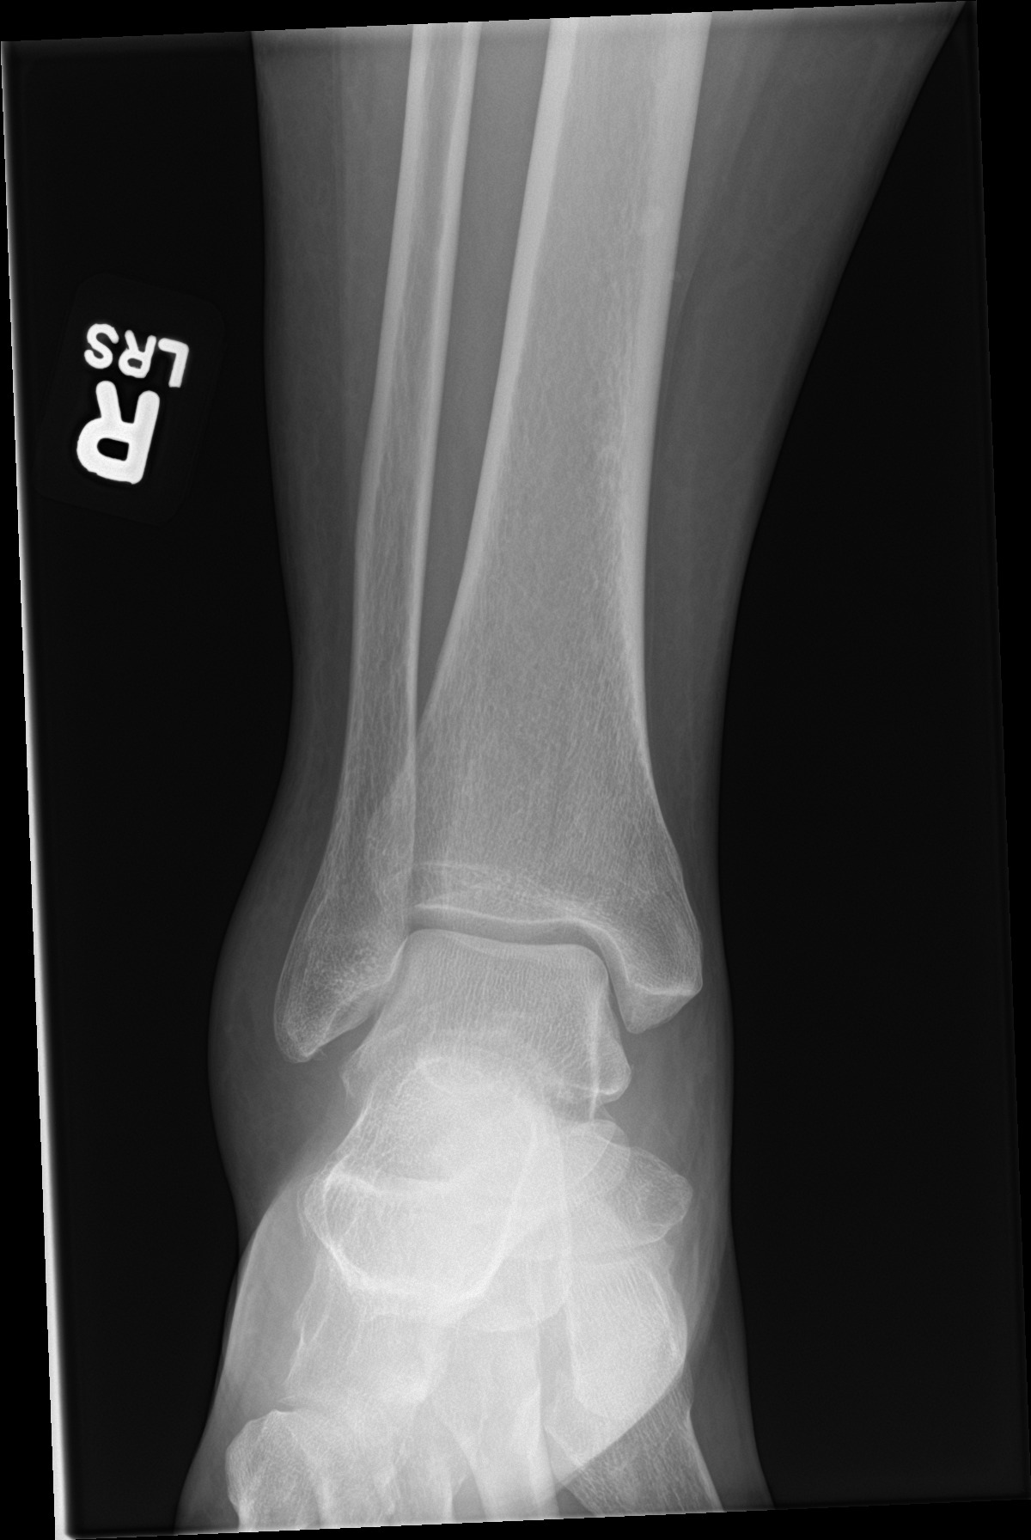

[ankle obl]
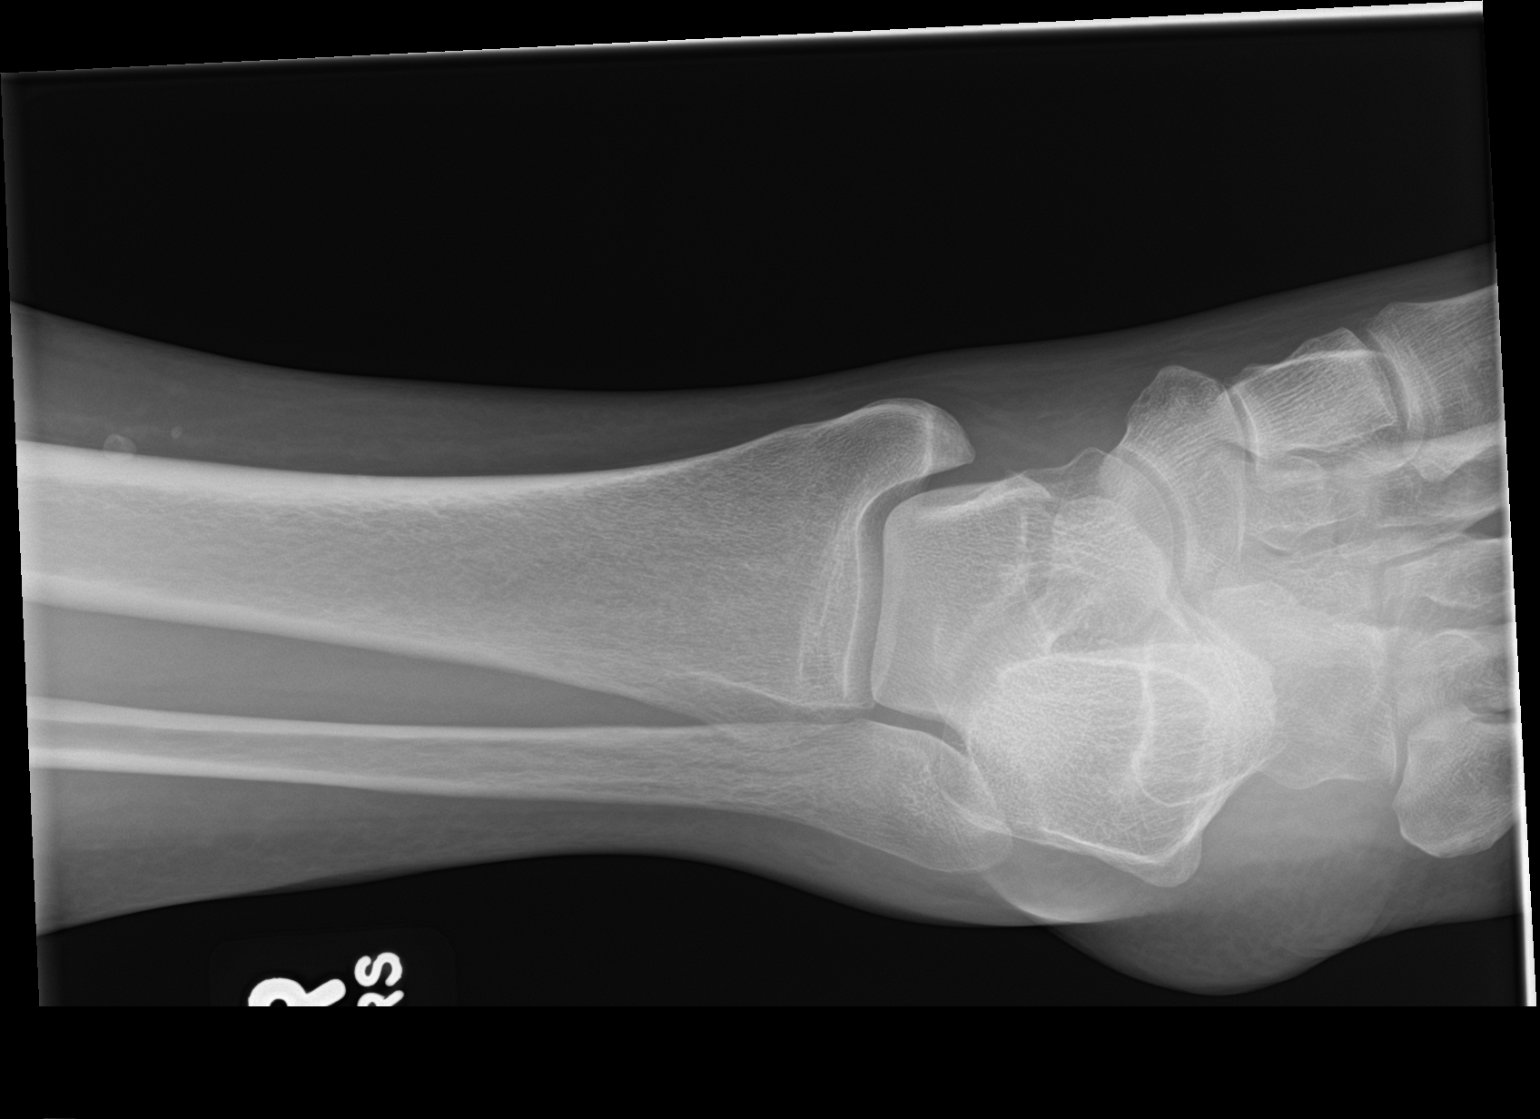

[ankle lat]
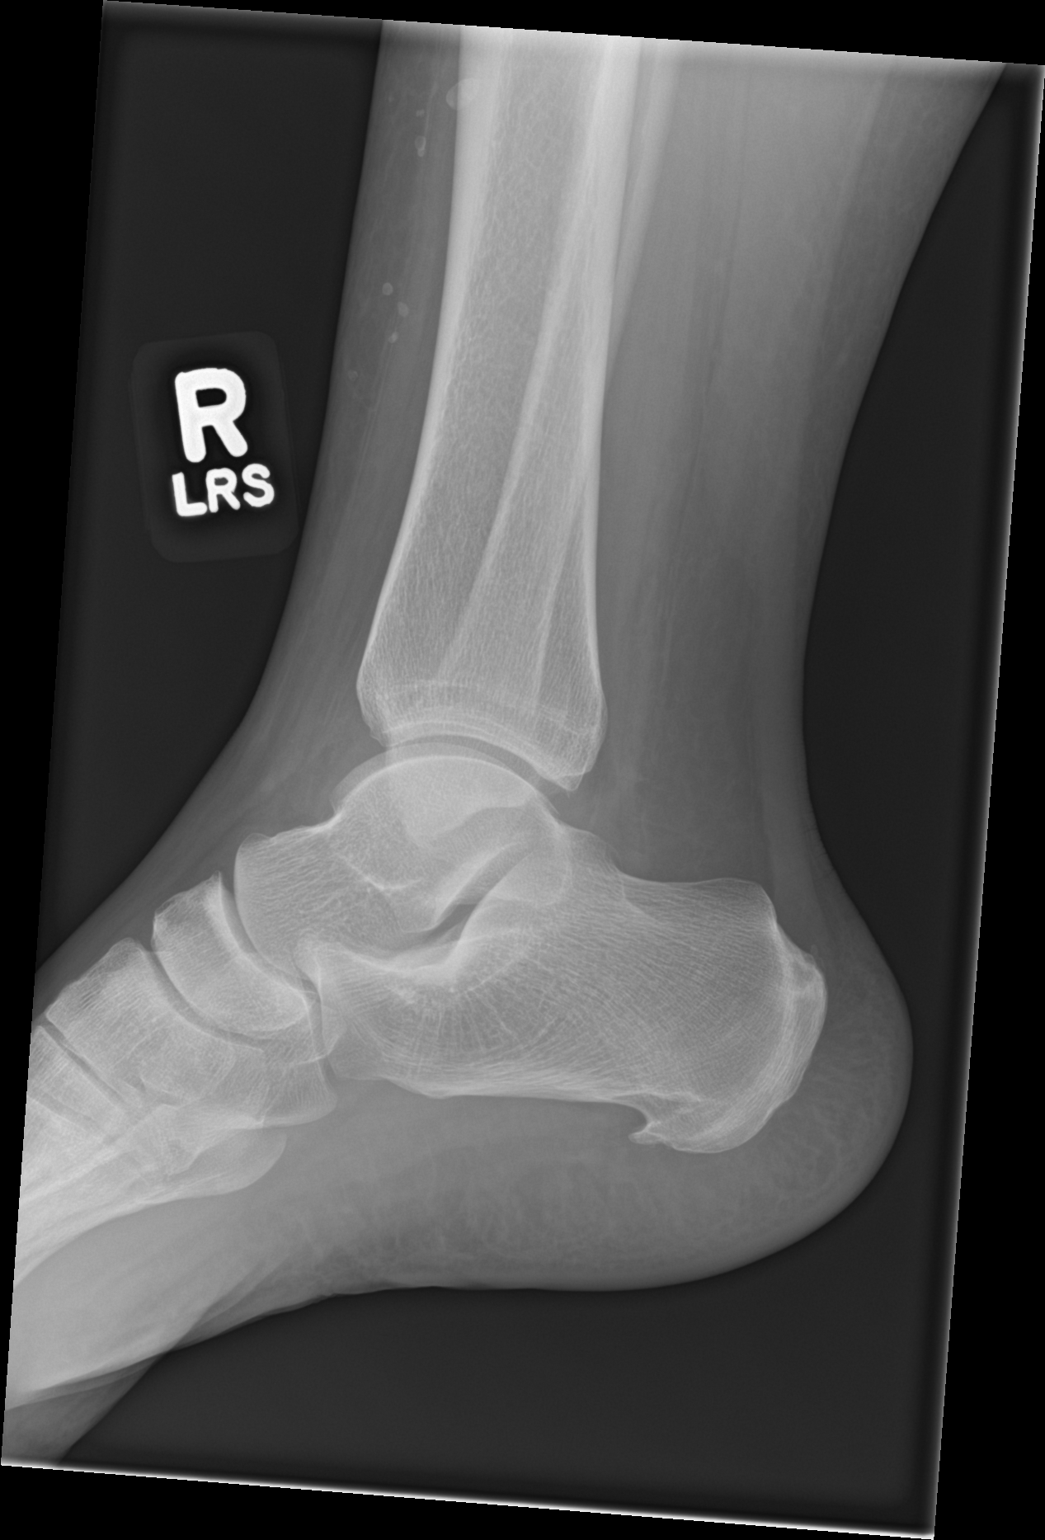

[3 of 3 positions shown; findings below may reference images not displayed]

FINDINGS: There is no evidence of fracture, dislocation, or joint effusion.
Moderate plantar calcaneal spur. There is no evidence of arthropathy
or other focal bone abnormality. Soft tissue calcifications
anteriorly consistent with phleboliths. Soft tissue edema laterally.
IMPRESSION: Soft tissue edema without acute fracture or subluxation.

## 2019-05-10 ENCOUNTER — Encounter: Payer: Self-pay | Admitting: Family Medicine

## 2019-05-10 MED ORDER — ALPRAZOLAM 1 MG PO TABS: ORAL_TABLET | ORAL | 5 refills | Status: DC

## 2019-05-10 NOTE — Telephone Encounter (Signed)
Ok, I did this rx.-thx

## 2019-05-13 MED FILL — ALPRAZolam 1 MG TABS: 1 | 30 days supply | Qty: 60 | Fill #0

## 2019-05-21 ENCOUNTER — Telehealth: Payer: Self-pay

## 2019-05-21 ENCOUNTER — Encounter: Payer: Self-pay | Admitting: Family Medicine

## 2019-05-21 DIAGNOSIS — F339 Major depressive disorder, recurrent, unspecified: Secondary | ICD-10-CM

## 2019-05-21 NOTE — Telephone Encounter (Signed)
Patient sent a mychart message asking for a referral to a psychiatrist. I told her you would be out of the office until next week. She stated that she could wait until you get back. I told her if she changed her mind she could always call her insurance company and see who is in her network. Please advise, thank you

## 2019-05-23 NOTE — Telephone Encounter (Signed)
Psychiatry referral ordered

## 2019-05-23 NOTE — Addendum Note (Signed)
Addended by: Tammi Sou on: 05/23/2019 01:20 PM   Modules accepted: Orders

## 2019-05-24 NOTE — Telephone Encounter (Signed)
Called patient and told her someone would be contacting her for her referral. Patient asked if she could go to [facility] instead of [facility]. Based on their reviews it suits her better.    Please advise, thank you

## 2019-05-24 NOTE — Telephone Encounter (Signed)
[facility] has psychologists (counselors->do not rx meds) but no psychiatrists. The request was for a psychiatrist.  Let me know if she wants just the psychologist/counselor and I'll re-enter order.-thx

## 2019-05-24 NOTE — Telephone Encounter (Signed)
Noted  

## 2019-05-24 NOTE — Telephone Encounter (Signed)
I called patient and she stated that she will stick with [facility]. She thought that [facility] had Psychiatrists. Thank you!

## 2019-05-26 MED FILL — OMEPRAZOLE 20 MG CAPSULE DR: 20 | 30 days supply | Qty: 60 | Fill #2

## 2019-05-26 MED FILL — DULoxetine HCL 30 MG CPEP: 30 | 30 days supply | Qty: 30 | Fill #2

## 2019-06-02 ENCOUNTER — Encounter: Payer: Self-pay | Admitting: Family Medicine

## 2019-06-10 MED FILL — ALPRAZolam 1 MG TABS: 1 | 30 days supply | Qty: 60 | Fill #1

## 2019-06-15 ENCOUNTER — Encounter: Payer: Self-pay | Admitting: Family Medicine

## 2019-06-15 NOTE — Telephone Encounter (Signed)
Could we see why this patient never received a call?

## 2019-06-18 MED FILL — FEMYNOR 0.25-35 MG-MCG TABS: 0.25-35 | 84 days supply | Qty: 84 | Fill #0

## 2019-06-25 ENCOUNTER — Encounter (HOSPITAL_COMMUNITY): Payer: Self-pay | Admitting: Psychiatry

## 2019-06-25 ENCOUNTER — Ambulatory Visit (INDEPENDENT_AMBULATORY_CARE_PROVIDER_SITE_OTHER): Payer: 59 | Admitting: Psychiatry

## 2019-06-25 ENCOUNTER — Other Ambulatory Visit: Payer: Self-pay

## 2019-06-25 VITALS — Wt 248.0 lb

## 2019-06-25 DIAGNOSIS — F331 Major depressive disorder, recurrent, moderate: Secondary | ICD-10-CM | POA: Diagnosis not present

## 2019-06-25 DIAGNOSIS — G894 Chronic pain syndrome: Secondary | ICD-10-CM | POA: Diagnosis not present

## 2019-06-25 MED ORDER — DULOXETINE HCL 40 MG PO CPEP: 40.0000 mg | ORAL_CAPSULE | Freq: Every day | ORAL | 1 refills | Status: DC

## 2019-06-25 NOTE — Progress Notes (Signed)
Virtual Visit via Video Note  I connected with Toni [name] on 06/25/19 at  9:00 AM EDT by a video enabled telemedicine application and verified that I am speaking with the correct person using two identifiers.   I discussed the limitations of evaluation and management by telemedicine and the availability of in person appointments. The patient expressed understanding and agreed to proceed.   [facility] Initial Assessment Note  Camber Ninh [id number] 43 y.o.  06/25/2019 9:29 AM  Chief Complaint:  I do not think my medicine working.  I am still depressed.  History of Present Illness:  Patient is 43 year old employed divorced female who is self-referred for seeking management for her depression.  She is taking Cymbalta 30 mg for past few years and does not feel it is helping as much.  Patient struggle with her depression for past 20 years.  In the beginning she was taking Zoloft for 14 years until she had a seizures and her doctor told not to take Zoloft.  Her primary care physician Dr. Shawnie Dapper started on Cymbalta.  She felt in the beginning is working very well but past year and a half she has noticed her depression is coming back.  She has no motivation to do things.  She feels fatigue, lack of energy, poor sleep, racing thoughts, isolation and withdrawn.  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  She noticed symptoms started at that time but it got worse and COVID happened.  She also reported that her relationship was ended year ago which was from online dating app because the person was lying.  Patient even got matted but find out that person was already married and her marriage was voided.  Patient reported anxiety, insomnia, extreme sadness and feels sometimes hopeless.  She denies any suicidal thoughts, irritability, mania, psychosis or any hallucination.  She reported only time she feels good when  she leaves the house.  Patient also reported episodes of irritability, impulsive buying and shopping and feels good when she do online shopping.  She admitted some financial that denies any violent or aggressive behavior.  She lives by herself with her 69 year old son who recently moved in from [place] due to [place].  She has another 71 year old son who lives with his father.  Patient denies any nightmares, flashback any history of abuse.  Patient has multiple health issues.  She has history of gastric bypass and she takes multiple medication.  She has history of neck and back injury when she fell.  She takes narcotic pain medication, Xanax and medicine for migraine.  Patient admitted occasional drinking but denies any binge, intoxication or any blackouts.  She denies any illegal substance use.  She reported her appetite is fair however she had lost 60 pounds since the gastric bypass surgery.  Currently she is taking Cymbalta 30 mg at night and Xanax 1 mg for sleep.  She was prescribed trazodone but she does not take it since it did not help her.  Her medicine is prescribed by primary care physician Dr. Shawnie Dapper.  Past Psychiatric History/Hospitalization(s): No history of psychiatric inpatient treatment or any suicidal attempt.  History of depression after she got married.  Tried Zoloft for many years until she had a seizure and it was stopped.  PCP tried trazodone but that did not work.  History of irritability, poor impulse control with excessive buying and shopping but denies any mania or  psychosis.  Family History; Mother and aunt has psychiatric illness.  Past Medical History:  Diagnosis Date  . Allergic rhinitis   . 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 Anxiety and depression    started with PPD  . Chronic neck pain    Myofascial pain syndrome per [facility], oxycodone per their office pain med contract.  (Normal cervical MRI 2010 per ortho notes).  . Dietary iron deficiency  without anemia   . Easy bruisability 2011   Hx of: saw Hematologist (Dr. Cyndie ChimeGranfortuna) and w/u neg for von Willebrand desease.  Marland Kitchen. GERD (gastroesophageal reflux disease)    hx gastric ulcer and upper GI bleed, ? NSAID induced  . History of PCR DNA positive for HSV1   . History of PCR DNA positive for HSV2   . History of stomach ulcers   . Hypertension   . Irregular menses 03/2018   GYN started [facility] 0.25-0.35.  [facility] as of 10/2018.  u/s wnl.  Pt declined endom bx.  . 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 Migraine   . Reactive hypoglycemia    Dr. Everardo AllEllison  . Seizure (HCC) 2015   s/p eval with neuro, no medication, no events since  . Syncope    with ? seizure in 2015, eval with neruo  . Vitamin B12 deficiency    Starting monthly vit B12 injections 04/2019  . Vitamin D deficiency     Traumatic brain injury: Denies any history of brain injury.  Psychosocial and Work History; Patient [place]  She was raised by her biological mother and stepfather.  She was never close to her biological father who left patient's mother when patient's mom was pregnant.  Patient married once from the person from [place].  Together they have 2 children.  Patient told her husband has depression and he quit working and she felt that he was not supportive and decided to get divorce.  However now they are good friends and they have good communication.  Her 43 year old son lives with patient's ex-husband.  Patient remarriage from online dating app but find out that person was already married and her marriage was voided.  Currently patient is not in any relationship.  She is working in the billing department for past 5 years.  Legal History; Denies any legal issues.  History Of Abuse; Denies any history of abuse.  Substance Abuse History; Admitted occasional drinking but denies any binge, intoxication, blackouts or any withdrawal symptoms.  Review of  Systems: Psychiatric: Agitation: Irritability Hallucination: No Depressed Mood: Yes Insomnia: Yes Hypersomnia: No Altered Concentration: No Feels Worthless: Yes Grandiose Ideas: No Belief In Special Powers: No New/Increased Substance Abuse: No Compulsions: No  Neurologic: Headache: Yes Seizure: History of one seizure many years ago Paresthesias: No   Outpatient Encounter Medications as of 06/25/2019  Medication Sig  . acetaminophen (TYLENOL) 500 MG tablet Take 1,000 mg by mouth every 6 (six) hours as needed for mild pain, moderate pain or headache.  . Alcohol Swabs (ALCOHOL WIPES) 70 % PADS Use to clean finger before checking blood sugar, checks blood sugar 3-4 times daily  . ALPRAZolam (XANAX) 1 MG tablet 1 tab po bid prn anxiety  . Calcium 250 MG CAPS Take 1 capsule by mouth daily.   . cetirizine (ZYRTEC) 10 MG tablet Take 1 tablet (10 mg total) by mouth daily.  . cyanocobalamin (,VITAMIN B-12,) 1000 MCG/ML injection Inject 1 mL (1,000 mcg total) into  the muscle every 30 (thirty) days.  . DULoxetine (CYMBALTA) 30 MG capsule TAKE 1 CAPSULE BY MOUTH DAILY.  . fluticasone (FLONASE) 50 MCG/ACT nasal spray PLACE 2 SPRAYS INTO BOTH NOSTRILS DAILY.  Marland Kitchen glucose blood test strip Use to check blood sugar three times daily.  . hydrochlorothiazide (HYDRODIURIL) 25 MG tablet TAKE 1 TABLET BY MOUTH DAILY.  . metoprolol succinate (TOPROL-XL) 50 MG 24 hr tablet Take 1 tablet (50 mg total) by mouth daily. Take with or immediately following a meal.  . MONONESSA 0.25-35 MG-MCG tablet Take 1 tablet by mouth once daily  . montelukast (SINGULAIR) 10 MG tablet Take 1 tablet (10 mg total) by mouth at bedtime.  Marland Kitchen omeprazole (PRILOSEC) 20 MG capsule TAKE 1 CAPSULE BY MOUTH 2 TIMES DAILY BEFORE A MEAL. OFFICE VISIT NEEDED  . potassium chloride SA (K-DUR) 20 MEQ tablet TAKE 1 TABLET BY MOUTH DAILY.  . rizatriptan (MAXALT-MLT) 10 MG disintegrating tablet 1 tab po qd as needed for migraine headache.  May  repeat dose in 2 hours if needed.  Max dose in 24h is 20 mg.  . traZODone (DESYREL) 100 MG tablet TAKE 1 TABLET BY MOUTH AT BEDTIME. (Patient not taking: Reported on 04/01/2019)  . Vitamin D, Ergocalciferol, (DRISDOL) 50000 units CAPS capsule 1 tab po twice per week   No facility-administered encounter medications on file as of 06/25/2019.     Recent Results (from the past 2160 hour(s))  CBC with Differential/Platelet     Status: None   Collection Time: 03/31/19  1:24 AM  Result Value Ref Range   WBC 7.3 4.0 - 10.5 K/uL   RBC 4.78 3.87 - 5.11 MIL/uL   Hemoglobin 13.9 12.0 - 15.0 g/dL   HCT 15.5 20.8 - 02.2 %   MCV 92.1 80.0 - 100.0 fL   MCH 29.1 26.0 - 34.0 pg   MCHC 31.6 30.0 - 36.0 g/dL   RDW 33.6 12.2 - 44.9 %   Platelets 332 150 - 400 K/uL   nRBC 0.0 0.0 - 0.2 %   Neutrophils Relative % 40 %   Neutro Abs 2.9 1.7 - 7.7 K/uL   Lymphocytes Relative 50 %   Lymphs Abs 3.7 0.7 - 4.0 K/uL   Monocytes Relative 8 %   Monocytes Absolute 0.6 0.1 - 1.0 K/uL   Eosinophils Relative 1 %   Eosinophils Absolute 0.1 0.0 - 0.5 K/uL   Basophils Relative 1 %   Basophils Absolute 0.1 0.0 - 0.1 K/uL   Immature Granulocytes 0 %   Abs Immature Granulocytes 0.01 0.00 - 0.07 K/uL    Comment: Performed at [facility], [address] [place], [place] [address]  Basic metabolic panel     Status: None   Collection Time: 03/31/19  1:24 AM  Result Value Ref Range   Sodium 138 135 - 145 mmol/L   Potassium 3.5 3.5 - 5.1 mmol/L   Chloride 107 98 - 111 mmol/L   CO2 23 22 - 32 mmol/L   Glucose, Bld 95 70 - 99 mg/dL   BUN 19 6 - 20 mg/dL   Creatinine, Ser 5.11 0.44 - 1.00 mg/dL   Calcium 8.9 8.9 - 02.1 mg/dL   GFR calc non Af Amer >60 >60 mL/min   GFR calc Af Amer >60 >60 mL/min   Anion gap 8 5 - 15    Comment: Performed at [facility], [address] [place], [place] [address]  Novel Coronavirus,NAA,(SEND-OUT TO REF LAB -  TAT 24-48 hrs); Hosp Order     Status: None    Collection Time: 03/31/19  1:52 AM   Specimen: Nasopharyngeal Swab; Respiratory  Result Value Ref Range   SARS-CoV-2, NAA NOT DETECTED NOT DETECTED    Comment: (NOTE) This test was developed and its performance characteristics determined by [employer]. This test has not been FDA cleared or approved. This test has been authorized by FDA under an Emergency Use Authorization (EUA). This test is only authorized for the duration of time the declaration that circumstances exist justifying the authorization of the emergency use of in vitro diagnostic tests for detection of SARS-CoV-2 virus and/or diagnosis of COVID-19 infection under section 564(b)(1) of the Act, 21 U.S.C. 161WRU-0(A)(5), unless the authorization is terminated or revoked sooner. When diagnostic testing is negative, the possibility of a false negative result should be considered in the context of a patient's recent exposures and the presence of clinical signs and symptoms consistent with COVID-19. An individual without symptoms of COVID-19 and who is not shedding SARS-CoV-2 virus would expect to have a negative (not detected) result in this assay. Performed  At: [facility] [address] [place], [place] [id number] Jolene Schimke MD NW:[id number]    Coronavirus Source NASOPHARYNGEAL     Comment: Performed at [facility], [address] [place], [place] [address]  VITAMIN D 25 Hydroxy (Vit-D Deficiency, Fractures)     Status: None   Collection Time: 04/01/19  3:33 PM  Result Value Ref Range   VITD 35.43 30.00 - 100.00 ng/mL  Iron, TIBC and Ferritin Panel     Status: Abnormal   Collection Time: 04/01/19  3:33 PM  Result Value Ref Range   Iron 100 40 - 190 mcg/dL   TIBC 962 (H) 952 - 841 mcg/dL (calc)   %SAT 21 16 - 45 % (calc)   Ferritin 73 16 - 232 ng/mL  Vitamin B12     Status: Abnormal   Collection Time: 04/01/19  3:33 PM  Result Value Ref Range   Vitamin B-12 194 (L) 211 - 911 pg/mL   TSH     Status: None   Collection Time: 04/01/19  3:33 PM  Result Value Ref Range   TSH 0.73 0.35 - 4.50 uIU/mL  Hepatic function panel     Status: None   Collection Time: 04/01/19  3:33 PM  Result Value Ref Range   Total Bilirubin 0.6 0.2 - 1.2 mg/dL   Bilirubin, Direct 0.1 0.0 - 0.3 mg/dL   Alkaline Phosphatase 64 39 - 117 U/L   AST 13 0 - 37 U/L   ALT 8 0 - 35 U/L   Total Protein 6.9 6.0 - 8.3 g/dL   Albumin 4.1 3.5 - 5.2 g/dL  Pain Mgmt, Profile 8 w/Conf, U     Status: None   Collection Time: 04/01/19  3:33 PM  Result Value Ref Range   Creatinine >300.0 mg/dL   pH 5.1 4.5 - 9.0   Oxidant NEGATIVE mcg/mL   Amphetamines NEGATIVE ng/mL   medMATCH Amphetamines CONSISTENT    Benzodiazepines POSITIVE ng/mL   Alphahydroxyalprazolam 3,188 ng/mL    Comment: This test was developed and its analytical performance characteristics have been determined by [employer]. It has not been cleared or approved by the FDA. This assay has been validated pursuant to the CLIA regulations and is used for clinical purposes.    medMATCH aOH alprazolam INCONSISTENT    Alphahydroxymidazolam NEGATIVE ng/mL    Comment: This test  was developed and its analytical performance characteristics have been determined by [employer]. It has not been cleared or approved by the FDA. This assay has been validated pursuant to the CLIA regulations and is used for clinical purposes.    medMATCH aOH midazolam CONSISTENT    Alphahydroxytriazolam NEGATIVE ng/mL    Comment: This test was developed and its analytical performance characteristics have been determined by [employer]. It has not been cleared or approved by the FDA. This assay has been validated pursuant to the CLIA regulations and is used for clinical purposes.    medMATCH aOH triazolam CONSISTENT    Aminoclonazepam NEGATIVE ng/mL    Comment: This test was developed and its analytical performance characteristics have been determined  by [employer]. It has not been cleared or approved by the FDA. This assay has been validated pursuant to the CLIA regulations and is used for clinical purposes.    medMATCH Aminoclonazepam CONSISTENT    Hydroxyethylflurazepam NEGATIVE ng/mL    Comment: This test was developed and its analytical performance characteristics have been determined by [employer]. It has not been cleared or approved by the FDA. This assay has been validated pursuant to the CLIA regulations and is used for clinical purposes.    medMATCH OH,Et flurazepam CONSISTENT    Lorazepam NEGATIVE ng/mL    Comment: This test was developed and its analytical performance characteristics have been determined by [employer]. It has not been cleared or approved by the FDA. This assay has been validated pursuant to the CLIA regulations and is used for clinical purposes.    medMATCH Lorazepam CONSISTENT    Nordiazepam NEGATIVE ng/mL    Comment: This test was developed and its analytical performance characteristics have been determined by [employer]. It has not been cleared or approved by the FDA. This assay has been validated pursuant to the CLIA regulations and is used for clinical purposes.    medMATCH Nordiazepam CONSISTENT    Oxazepam 125 ng/mL    Comment: This test was developed and its analytical performance characteristics have been determined by [employer]. It has not been cleared or approved by the FDA. This assay has been validated pursuant to the CLIA regulations and is used for clinical purposes.    medMATCH Oxazepam INCONSISTENT     Comment: Oxazepam is a metabolite of chlordiazepoxide, diazepam, nordiazepam and temazepam.  Oxazepam is also a prescribed drug.    Temazepam NEGATIVE ng/mL    Comment: This test was developed and its analytical performance characteristics have been determined by [employer]. It has not been cleared or approved by the FDA. This assay  has been validated pursuant to the CLIA regulations and is used for clinical purposes.    medMATCH Temazepam CONSISTENT    Marijuana Metabolite NEGATIVE ng/mL   medMATCH Marijuana Metab CONSISTENT    Cocaine Metabolite NEGATIVE ng/mL   medMATCH Cocaine Metab CONSISTENT    Opiates NEGATIVE CONFIRMED ng/mL   Codeine NEGATIVE ng/mL    Comment: This test was developed and its analytical performance characteristics have been determined by [employer]. It has not been cleared or approved by the FDA. This assay has been validated pursuant to the CLIA regulations and is used for clinical purposes.    medMATCH Codeine CONSISTENT    Hydrocodone NEGATIVE ng/mL    Comment: This test was developed and its analytical performance characteristics have been determined by [employer]. It has not been cleared or approved by the FDA. This assay has been validated  pursuant to the CLIA regulations and is used for clinical purposes.    medMATCH Hydrocodone CONSISTENT    Hydromorphone NEGATIVE ng/mL    Comment: This test was developed and its analytical performance characteristics have been determined by [employer]. It has not been cleared or approved by the FDA. This assay has been validated pursuant to the CLIA regulations and is used for clinical purposes.    medMATCH Hydromorphone CONSISTENT    Morphine NEGATIVE ng/mL    Comment: This test was developed and its analytical performance characteristics have been determined by [employer]. It has not been cleared or approved by the FDA. This assay has been validated pursuant to the CLIA regulations and is used for clinical purposes.    medMATCH Morphine CONSISTENT    Norhydrocodone NEGATIVE ng/mL    Comment: This test was developed and its analytical performance characteristics have been determined by [employer]. It has not been cleared or approved by the FDA. This assay has been validated pursuant to the  CLIA regulations and is used for clinical purposes.    medMATCH Norhydrocodone CONSISTENT    Oxycodone POSITIVE ng/mL   Noroxycodone 3,982 ng/mL    Comment: This test was developed and its analytical performance characteristics have been determined by [employer]. It has not been cleared or approved by the FDA. This assay has been validated pursuant to the CLIA regulations and is used for clinical purposes.    medMATCH Noroxycodone INCONSISTENT     Comment: Noroxycodone is a metabolite of Oxycodone.   Oxycodone 630 ng/mL    Comment: This test was developed and its analytical performance characteristics have been determined by [employer]. It has not been cleared or approved by the FDA. This assay has been validated pursuant to the CLIA regulations and is used for clinical purposes.    medMATCH Oxycodone INCONSISTENT    Oxymorphone 1,526 ng/mL    Comment: This test was developed and its analytical performance characteristics have been determined by [employer]. It has not been cleared or approved by the FDA. This assay has been validated pursuant to the CLIA regulations and is used for clinical purposes.    medMATCH Oxymorphone INCONSISTENT     Comment: Oxymorphone is a metabolite of oxycodone as well as a prescribed drug.    Buprenorphine, Urine NEGATIVE ng/mL   medMATCH Buprenorphine CONSISTENT    MDMA NEGATIVE ng/mL   [facility] MDMA CONSISTENT    Alcohol Metabolites NEGATIVE CONFIRMED <500 ng/mL   Ethyl Glucuronide (ETG) NEGATIVE ng/mL    Comment: This test was developed and its analytical performance characteristics have been determined by [employer]. It has not been cleared or approved by the FDA. This assay has been validated pursuant to the CLIA regulations and is used for clinical purposes.    medMATCH ETG CONSISTENT    Ethyl Sulfate (ETS) NEGATIVE ng/mL    Comment: This test was developed and its analytical performance characteristics  have been determined by [employer]. It has not been cleared or approved by the FDA. This assay has been validated pursuant to the CLIA regulations and is used for clinical purposes.    medMATCH ETS CONSISTENT    6 Acetylmorphine NEGATIVE ng/mL   medMATCH 6 Acetylmorphine CONSISTENT     Comment: See Note 1 See Note 1 See Note 1 See Note 1 See Note 1 . Note 1 This drug testing is for medical treatment only.   Analysis was performed as non-forensic testing and  these results should be  used only by healthcare  providers to render diagnosis or treatment, or to  monitor progress of medical conditions. Sharyn Lull. medMATCH comments are:  - present when drug test results may be the result of     metabolism of one or more drugs or when results are     inconsistent with prescribed medication(s) listed.  - may be blank when drug results are consistent with     prescribed medication(s) listed. . For assistance with interpreting these drug results,  please contact a [facility] Toxicology  Specialist: [phone] TOX [phone]), M-F,  8am-6pm EST.       Constitutional:  There were no vitals taken for this visit.   Musculoskeletal: Strength & Muscle Tone: within normal limits Gait & Station: normal Patient leans: N/A  Psychiatric Specialty Exam: Physical Exam  ROS  There were no vitals taken for this visit.There is no height or weight on file to calculate BMI.  General Appearance: Casual  Eye Contact:  Good  Speech:  Clear and Coherent  Volume:  Normal  Mood:  Anxious and Depressed  Affect:  Congruent  Thought Process:  Goal Directed  Orientation:  Full (Time, Place, and Person)  Thought Content:  Rumination  Suicidal Thoughts:  No  Homicidal Thoughts:  No  Memory:  Immediate;   Good Recent;   Good Remote;   Good  Judgement:  Good  Insight:  Good  Psychomotor Activity:  Normal  Concentration:  Concentration: Good and Attention Span: Good  Recall:  Good   Fund of Knowledge:  Good  Language:  Good  Akathisia:  No  Handed:  Right  AIMS (if indicated):     Assets:  Communication Skills Desire for Improvement Housing Resilience Social Support Talents/Skills  ADL's:  Intact  Cognition:  WNL  Sleep:      Assessment and Plan: Patient is 43 year old female with significant medical history taking multiple medication for pain anxiety and depression wanted to establish care in our office.  Currently she is taking Cymbalta 30 mg and Xanax 1 mg at bedtime.  I discussed that she can consider going up the Cymbalta 40 mg to see if her symptoms get better.  We talked about polypharmacy especially taking hydrocodone and Xanax together.  I suggested that she should try nonnarcotic pain medicine to help her energy level since these medicine can cause fatigue and lack of motivation.  I also discussed about extra advantage of Cymbalta to help her chronic pain.  She is willing to try Cymbalta 40 mg and I recommended to take in the morning.  She will continue Xanax from her primary care physician for now.  We will consider adjusting Cymbalta dose in the future if needed.  I also offered therapy but at this time patient wants to wait.  Discussed medication side effects and benefits.  Discussed safety concerns and anytime having active suicidal thoughts or homicidal thought that she need to call 911 of the local emergency room.  Follow-up in 4 weeks.  Follow Up Instructions:    I discussed the assessment and treatment plan with the patient. The patient was provided an opportunity to ask questions and all were answered. The patient agreed with the plan and demonstrated an understanding of the instructions.   The patient was advised to call back or seek an in-person evaluation if the symptoms worsen or if the condition fails to improve as anticipated.  I provided 55 minutes of non-face-to-face time during this encounter.   Cleotis NipperSyed T Arfeen, MD

## 2019-06-28 ENCOUNTER — Other Ambulatory Visit: Payer: Self-pay | Admitting: Family Medicine

## 2019-06-28 ENCOUNTER — Other Ambulatory Visit (HOSPITAL_COMMUNITY): Payer: Self-pay

## 2019-06-28 DIAGNOSIS — F331 Major depressive disorder, recurrent, moderate: Secondary | ICD-10-CM

## 2019-06-28 MED ORDER — DULOXETINE HCL 20 MG PO CPEP: 20.0000 mg | ORAL_CAPSULE | Freq: Two times a day (BID) | ORAL | 2 refills | Status: DC

## 2019-06-28 MED FILL — DULoxetine HCL 20 MG CPEP: 20 | 30 days supply | Qty: 60 | Fill #0

## 2019-06-28 MED FILL — METOPROLOL SUCCINATE ER 50: 50 | 90 days supply | Qty: 90 | Fill #1

## 2019-06-28 MED FILL — OMEPRAZOLE 20 MG CAPSULE DR: 20 | 30 days supply | Qty: 60 | Fill #0

## 2019-06-30 MED FILL — MONTELUKAST SOD 10 MG TAB: 10 | 30 days supply | Qty: 30 | Fill #0

## 2019-07-08 MED FILL — ALPRAZolam 1 MG TABS: 1 | 30 days supply | Qty: 60 | Fill #2

## 2019-07-17 ENCOUNTER — Encounter: Payer: Self-pay | Admitting: Family Medicine

## 2019-07-19 NOTE — Telephone Encounter (Signed)
Called patient and she didn't mean to send [place] that message. She was trying to get Dr. Marguerite Olea office.

## 2019-07-22 ENCOUNTER — Ambulatory Visit (HOSPITAL_COMMUNITY): Payer: Self-pay | Admitting: Psychiatry

## 2019-07-23 ENCOUNTER — Ambulatory Visit (HOSPITAL_COMMUNITY): Payer: Self-pay | Admitting: Psychiatry

## 2019-07-29 ENCOUNTER — Encounter: Payer: Self-pay | Admitting: Family Medicine

## 2019-07-29 NOTE — Telephone Encounter (Signed)
HCTZ has diuretic effect, but reassure her that the metoprolol does not. However, it would be unusual for the HCTZ to be causing this problem now after having been on it for quite a while. However, if she wants to stop hctz for a week or two and see how her cramping goes that is fine. Monitor bp's and let us know if she has a persistent avg >135 on top or >85 on bottom. -thx

## 2019-07-29 NOTE — Telephone Encounter (Signed)
Pt's last visit was 04/01/19 and scheduled for next f/u 09/17/19. She currently takes hctz 25mg  and metoprolol 50mg    Should patient continue hctz 25mg  qd? Please advise, thanks.

## 2019-07-30 ENCOUNTER — Ambulatory Visit (HOSPITAL_COMMUNITY): Payer: Self-pay | Admitting: Psychiatry

## 2019-08-05 MED FILL — DULoxetine HCL 20 MG CPEP: 20 | 30 days supply | Qty: 60 | Fill #1

## 2019-08-05 MED FILL — OMEPRAZOLE 20 MG CAPSULE DR: 20 | 30 days supply | Qty: 60 | Fill #1

## 2019-08-09 MED FILL — ALPRAZolam 1 MG TABS: 1 | 30 days supply | Qty: 60 | Fill #3

## 2019-08-10 ENCOUNTER — Ambulatory Visit (INDEPENDENT_AMBULATORY_CARE_PROVIDER_SITE_OTHER): Payer: 59 | Admitting: Psychiatry

## 2019-08-10 ENCOUNTER — Other Ambulatory Visit: Payer: Self-pay

## 2019-08-10 ENCOUNTER — Encounter (HOSPITAL_COMMUNITY): Payer: Self-pay | Admitting: Psychiatry

## 2019-08-10 DIAGNOSIS — F331 Major depressive disorder, recurrent, moderate: Secondary | ICD-10-CM

## 2019-08-10 MED ORDER — DULOXETINE HCL 60 MG PO CPEP: 60.0000 mg | ORAL_CAPSULE | Freq: Every day | ORAL | 1 refills | Status: DC

## 2019-08-10 MED FILL — DULoxetine HCL 60 MG CPEP: 60 | 30 days supply | Qty: 30 | Fill #0

## 2019-08-10 NOTE — Progress Notes (Signed)
Virtual Visit via Telephone Note  I connected with Toni [name] on 08/10/19 at  4:00 PM EST by telephone and verified that I am speaking with the correct person using two identifiers.   I discussed the limitations, risks, security and privacy concerns of performing an evaluation and management service by telephone and the availability of in person appointments. I also discussed with the patient that there may be a patient responsible charge related to this service. The patient expressed understanding and agreed to proceed.   History of Present Illness: Patient was evaluated by phone session.  She is a 43 year old female who was evaluated first time 4 weeks ago.  She was struggle with depression for past 20 years.  Her PCP started recently on Cymbalta and we recommended to increase the dose to 40 mg.  She is feeling somewhat better.  Her sleep is improved but she still struggle with fatigue, lack of energy and decreased motivation to do things.  However she denies any irritability, crying spells or any suicidal thoughts.  Now she is open to seeing a therapist.  She lives with her 2 children.  She is working from home.  She works for [employer] and [employer].  She admitted chronic pain, headaches and she takes pain medication.  She is also prescribed Xanax which he takes at nighttime to help her sleep.  She noticed since started the Cymbalta she is not as irritable, angry or any feeling of hopelessness.  Her appetite is improved.  She is tolerating medication without any side effects.  She has no tremors, shakes or any EPS.  She is willing to try higher dose of Cymbalta.   Past Psychiatric History: No history of psychiatric inpatient treatment or any suicidal attempt.  History of depression after she got married.  Tried Zoloft for many years until she had a seizure and it was stopped.  PCP tried trazodone but that did not work.  History of irritability, poor impulse control with excessive buying  and shopping but denies any mania or psychosis.  Psychiatric Specialty Exam: Physical Exam  Review of Systems  Musculoskeletal: Positive for back pain, joint pain and neck pain.    There were no vitals taken for this visit.There is no height or weight on file to calculate BMI.  General Appearance: NA  Eye Contact:  NA  Speech:  Clear and Coherent and Slow  Volume:  Decreased  Mood:  Anxious and Dysphoric  Affect:  NA  Thought Process:  Goal Directed  Orientation:  Full (Time, Place, and Person)  Thought Content:  Rumination  Suicidal Thoughts:  No  Homicidal Thoughts:  No  Memory:  Immediate;   Good Recent;   Good Remote;   Good  Judgement:  Good  Insight:  Present  Psychomotor Activity:  NA  Concentration:  Concentration: Fair and Attention Span: Fair  Recall:  Good  Fund of Knowledge:  Good  Language:  Good  Akathisia:  No  Handed:  Right  AIMS (if indicated):     Assets:  Communication Skills Desire for Improvement Housing Resilience Talents/Skills Transportation  ADL's:  Intact  Cognition:  WNL  Sleep:   ok      Assessment and Plan: Major depressive disorder, recurrent.  Recommended to try Cymbalta 60 mg.  She seen improvement from 20 mg to 40 mg.  She is also getting Xanax from PCP.  We have discussed trying nonnarcotic pain medication but patient afraid because she has a lot of pain in  her neck and back and she does not want her pain to get worse.  She is now willing to see a therapist and we will schedule appointment to see a counselor in our office for coping skills.  Discussed medication side effects and benefits.  Recommended to call us back if she has any question or any concern.  We will try Cymbalta 60 mg daily.  Follow-up in 2 months.  Follow Up Instructions:    I discussed the assessment and treatment plan with the patient. The patient was provided an opportunity to ask questions and all were answered. The patient agreed with the plan and  demonstrated an understanding of the instructions.   The patient was advised to call back or seek an in-person evaluation if the symptoms worsen or if the condition fails to improve as anticipated.  I provided 20 minutes of non-face-to-face time during this encounter.   Kathlee Nations, MD

## 2019-08-19 ENCOUNTER — Other Ambulatory Visit: Payer: Self-pay

## 2019-08-19 ENCOUNTER — Encounter (HOSPITAL_COMMUNITY): Payer: Self-pay | Admitting: Licensed Clinical Social Worker

## 2019-08-19 ENCOUNTER — Ambulatory Visit (INDEPENDENT_AMBULATORY_CARE_PROVIDER_SITE_OTHER): Payer: 59 | Admitting: Licensed Clinical Social Worker

## 2019-08-19 DIAGNOSIS — F331 Major depressive disorder, recurrent, moderate: Secondary | ICD-10-CM | POA: Diagnosis not present

## 2019-08-19 NOTE — Progress Notes (Signed)
Comprehensive Clinical Assessment (CCA) Note  08/19/2019 Toni [name] [id number]  Visit Diagnosis:      ICD-10-CM   1. MDD (major depressive disorder), recurrent episode, moderate (HCC)  F33.1       CCA Part One  Part One has been completed on paper by the patient.  (See scanned document in Chart Review)  CCA Part Two A  Intake/Chief Complaint:  CCA Intake With Chief Complaint CCA Part Two Date: 08/19/19 CCA Part Two Time: 1513 Chief Complaint/Presenting Problem: I have depression, family hx of depression, it's gotten worse this week, I'm obsessing over things, I'm drinking more Patients Currently Reported Symptoms/Problems: depressive symptoms, obsessive symptoms Collateral Involvement: dr Lolly Mustache note Individual's Strengths: desire to feel better Individual's Preferences: prefers to not feel like this Individual's Abilities: ability to feel better Type of Services Patient Feels Are Needed: unsure  Mental Health Symptoms Depression:  Depression: Change in energy/activity, Difficulty Concentrating, Fatigue, Tearfulness  Mania:     Anxiety:   Anxiety: Difficulty concentrating, Fatigue, Irritability, Worrying, Tension  Psychosis:     Trauma:  Trauma: Avoids reminders of event, Detachment from others, Irritability/anger, Emotional numbing(married at young age to get out of my mother's house, bad divorce. being a single parent for 10 years)  Obsessions:  Obsessions: Cause anxiety, Disrupts routine/functioning, Intrusive/time consuming  Compulsions:     Inattention:     Hyperactivity/Impulsivity:     Oppositional/Defiant Behaviors:     Borderline Personality:     Other Mood/Personality Symptoms:      Mental Status Exam Appearance and self-care  Stature:  Stature: Average  Weight:  Weight: Average weight  Clothing:  Clothing: Casual  Grooming:  Grooming: Normal  Cosmetic use:  Cosmetic Use: None  Posture/gait:  Posture/Gait: Normal  Motor activity:  Motor Activity: Not  Remarkable  Sensorium  Attention:  Attention: Normal  Concentration:  Concentration: Anxiety interferes  Orientation:  Orientation: X5  Recall/memory:  Recall/Memory: Defective in short-term  Affect and Mood  Affect:  Affect: Depressed  Mood:  Mood: Depressed  Relating  Eye contact:  Eye Contact: Normal  Facial expression:  Facial Expression: Anxious  Attitude toward examiner:  Attitude Toward Examiner: Cooperative  Thought and Language  Speech flow: Speech Flow: Normal  Thought content:  Thought Content: Appropriate to mood and circumstances  Preoccupation:  Preoccupations: Obsessions  Hallucinations:     Organization:     Company secretary of Knowledge:  Fund of Knowledge: Impoverished by:  (Comment)  Intelligence:  Intelligence: Average  Abstraction:  Abstraction: Normal  Judgement:  Judgement: Fair  Dance movement psychotherapist:  Reality Testing: Realistic  Insight:  Insight: Fair  Decision Making:  Decision Making: Normal  Social Functioning  Social Maturity:  Social Maturity: Isolates  Social Judgement:  Social Judgement: Normal  Stress  Stressors:  Stressors: Family conflict, Arts administrator, Work  Coping Ability:  Coping Ability: Deficient supports, Designer, jewellery, Building surveyor Deficits:     Supports:      Family and Psychosocial History: Family history Marital status: Divorced Number of Years Married: 17 Divorced, when?: 2014 What types of issues is patient dealing with in the relationship?: co-parenting, good relationship now, he's disabled Does patient have children?: Yes How many children?: 2 How is patient's relationship with their children?: 1 child lives with his father (62) for last 2 years. 22 yo lives with me  Childhood History:  Childhood History By whom was/is the patient raised?: Mother/father and step-parent Additional childhood history information: mother separated from stepfather when i was 30,  then she started dating a drug addict and I left home to get  married Description of patient's relationship with caregiver when they were a child: father was not in the picture, good relationship as a child but got rocky when i was a teenager Patient's description of current relationship with people who raised him/her: father deceased, i sometimes distance myself from my mother, she's selfish How were you disciplined when you got in trouble as a child/adolescent?: spankings Does patient have siblings?: No Did patient suffer any verbal/emotional/physical/sexual abuse as a child?: No Did patient suffer from severe childhood neglect?: No Has patient ever been sexually abused/assaulted/raped as an adolescent or adult?: No Was the patient ever a victim of a crime or a disaster?: No Witnessed domestic violence?: No Has patient been effected by domestic violence as an adult?: No  CCA Part Two B  Employment/Work Situation: Employment / Work Psychologist, occupationalituation Employment situation: Employed Where is patient currently employed?: [employer] [place] [employer] How long has patient been employed?: 5 years What is the longest time patient has a held a job?: [employer] 5 years Did You Receive Any Psychiatric Treatment/Services While in Equities traderthe Military?: No Are There Guns or Other Weapons in Your Home?: No  Education: Education Name of [employer] School: GED and some college Did Designer, television/film setYou Attend Graduate School?: No Did You Have An Individualized Education Program (IIEP): No Did You Have Any Difficulty At Progress EnergySchool?: No  Religion: Religion/Spirituality Are You A Religious Person?: Yes What is Your Religious Affiliation?: ChiropodistChristian  Leisure/Recreation: Leisure / Recreation Leisure and Hobbies: going to parks, being outside, movies, hanging out with friends  Exercise/Diet: Exercise/Diet Do You Exercise?: No Have You Gained or Lost A Significant Amount of Weight in the Past Six Months?: No Do You Follow a Special Diet?: No Do You Have Any Trouble Sleeping?: Yes Explanation of Sleeping  Difficulties: sometimes i worry  CCA Part Two C  Alcohol/Drug Use: Alcohol / Drug Use History of alcohol / drug use?: Yes Longest period of sobriety (when/how long): drinking more, box wine that i like which gives me more for $. 12 or 13 cups daily. physically sick the next morning Negative Consequences of Use: Financial, Legal, Personal relationships, Work / Engineer, waterchool(car accidents) Substance #1 Name of Substance 1: alcohol 1 - Frequency: 1x per week 1 - Last Use / Amount: 5 days ago                    CCA Part Three  ASAM's:  Six Dimensions of Multidimensional Assessment  Dimension 1:  Acute Intoxication and/or Withdrawal Potential:     Dimension 2:  Biomedical Conditions and Complications:     Dimension 3:  Emotional, Behavioral, or Cognitive Conditions and Complications:     Dimension 4:  Readiness to Change:     Dimension 5:  Relapse, Continued use, or Continued Problem Potential:     Dimension 6:  Recovery/Living Environment:      Substance use Disorder (SUD) Substance Use Disorder (SUD)  Checklist Symptoms of Substance Use: Evidence of tolerance, Continued use despite having a persistent/recurrent physical/psychological problem caused/exacerbated by use, Large amounts of time spent to obtain, use or recover from the substance(s)  Social Function:  Social Functioning Social Maturity: Isolates Social Judgement: Normal  Stress:  Stress Stressors: Family conflict, Arts administratorMoney, Work Coping Ability: Deficient supports, Designer, jewelleryxhausted, Science writerverwhelmed Patient Takes Medications The Way The Doctor Instructed?: Yes Priority Risk: Low Acuity  Risk Assessment- Self-Harm Potential: Risk Assessment For Self-Harm Potential Thoughts of Self-Harm: No current  thoughts Method: No plan Availability of Means: No access/NA  Risk Assessment -Dangerous to Others Potential: Risk Assessment For Dangerous to Others Potential Method: No Plan Availability of Means: No access or NA Intent: Vague  intent or NA Notification Required: No need or identified person  DSM5 Diagnoses: Patient Active Problem List   Diagnosis Date Noted  . MDD (major depressive disorder), recurrent episode, moderate ([place]) 08/19/2019  . Allergic rhinitis 04/20/2019  . Vaginal irritation 05/06/2018  . Menorrhagia 04/03/2018  . Long term (current) use of opiate analgesic 10/31/2017  . 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 Altered mental status 04/11/2016  . Hypoglycemia 03/21/2016  . Encephalopathy acute 03/21/2016  . Vaginal discharge 02/13/2016  . Visit for preventive health examination 11/24/2015  . Vitamin B12 deficiency 06/05/2015  . Vitamin D deficiency 04/30/2015  . Chronic neck pain 04/11/2015  . Esophageal reflux 04/11/2015  . Obesity 04/11/2015  . Anxiety and depression 04/11/2015  . Migraine   . History of bariatric surgery 12/19/2001    Patient Centered Plan: Patient is on the following Treatment Plan(s):  depressopm  Recommendations for Services/Supports/Treatments: Recommendations for Services/Supports/Treatments Recommendations For Services/Supports/Treatments: Individual Therapy, Medication Management  Treatment Plan Summary: OP Treatment Plan Summary: i don't want to feel depressed  Referrals to Alternative Service(s): Referred to Alternative Service(s):   Place:   Date:   Time:    Referred to Alternative Service(s):   Place:   Date:   Time:    Referred to Alternative Service(s):   Place:   Date:   Time:    Referred to Alternative Service(s):   Place:   Date:   Time:     Jenkins Rouge

## 2019-09-07 ENCOUNTER — Ambulatory Visit (HOSPITAL_COMMUNITY): Payer: 59 | Admitting: Licensed Clinical Social Worker

## 2019-09-07 ENCOUNTER — Other Ambulatory Visit: Payer: Self-pay

## 2019-09-07 MED FILL — FEMYNOR 0.25-35 MG-MCG TABS: 0.25-35 | 84 days supply | Qty: 84 | Fill #1

## 2019-09-07 MED FILL — ALPRAZolam 1 MG TABS: 1 | 30 days supply | Qty: 60 | Fill #4

## 2019-09-17 ENCOUNTER — Ambulatory Visit: Payer: 59 | Admitting: Family Medicine

## 2019-09-22 ENCOUNTER — Ambulatory Visit: Payer: 59 | Admitting: Family Medicine

## 2019-09-28 ENCOUNTER — Other Ambulatory Visit: Payer: Self-pay | Admitting: Family Medicine

## 2019-09-28 MED FILL — DULoxetine HCL 60 MG CPEP: 60 | 30 days supply | Qty: 30 | Fill #1

## 2019-09-28 MED FILL — VIT D2 1.25 MG (50,000 UNIT: 1.25 MG | 84 days supply | Qty: 24 | Fill #0

## 2019-09-28 MED FILL — METOPROLOL SUCCINATE ER 50: 50 | 90 days supply | Qty: 90 | Fill #2

## 2019-09-28 MED FILL — HYDROCHLOROTHIAZIDE 25 MG T: 25 | 30 days supply | Qty: 30 | Fill #0

## 2019-09-28 MED FILL — OMEPRAZOLE 20 MG CAPSULE DR: 20 | 30 days supply | Qty: 60 | Fill #2

## 2019-10-04 ENCOUNTER — Encounter: Payer: Self-pay | Admitting: Family Medicine

## 2019-10-06 ENCOUNTER — Telehealth: Payer: Self-pay

## 2019-10-06 NOTE — Telephone Encounter (Signed)
FMLA paperwork received by fax placed in doctor's folder.

## 2019-10-07 MED FILL — ALPRAZolam 1 MG TABS: 1 | 30 days supply | Qty: 60 | Fill #5

## 2019-10-07 NOTE — Telephone Encounter (Signed)
Placed on physicians form for review.

## 2019-10-08 DIAGNOSIS — E611 Iron deficiency: Secondary | ICD-10-CM

## 2019-10-08 HISTORY — DX: Iron deficiency: E61.1

## 2019-10-11 ENCOUNTER — Telehealth (HOSPITAL_COMMUNITY): Payer: Self-pay | Admitting: *Deleted

## 2019-10-11 ENCOUNTER — Encounter: Payer: Self-pay | Admitting: Family Medicine

## 2019-10-11 NOTE — Telephone Encounter (Signed)
Pt was sent my chart message that Dr Milinda Cave is out of the office and his medical assistant. She was told we would get back with her tomorrow upon their return

## 2019-10-11 NOTE — Telephone Encounter (Signed)
Pt called c/o "vivid dreams" that are disturbing her sleep and she is distressed about this. Pt is asking if the dosage of her Cymbalta may be decreased from 60mg  to 40mg  as she feels this is directly related to the Cymbalta. Looks like pt has been on this medication, with varying doses since at least 2017. Pt has an upcoming appointment on 10/19/19. Please review and advise.

## 2019-10-12 ENCOUNTER — Telehealth (HOSPITAL_COMMUNITY): Payer: Self-pay | Admitting: *Deleted

## 2019-10-12 ENCOUNTER — Other Ambulatory Visit (HOSPITAL_COMMUNITY): Payer: Self-pay | Admitting: *Deleted

## 2019-10-12 ENCOUNTER — Telehealth (HOSPITAL_COMMUNITY): Payer: Self-pay

## 2019-10-12 DIAGNOSIS — F331 Major depressive disorder, recurrent, moderate: Secondary | ICD-10-CM

## 2019-10-12 MED ORDER — DULOXETINE HCL 40 MG PO CPEP: 40.0000 mg | ORAL_CAPSULE | Freq: Every day | ORAL | 0 refills | Status: DC

## 2019-10-12 MED ORDER — DULOXETINE HCL 40 MG PO CPEP: 40.0000 mg | ORAL_CAPSULE | Freq: Every day | ORAL | 1 refills | Status: DC

## 2019-10-12 NOTE — Telephone Encounter (Signed)
Pharmacy called and requested a change to patient's Duloxetine 40mg . They stated that the insurance will not cover 40mg  but that it will cover Duloxetine 20mg  1 po BID #60 1 refill. Would you like it changed to that? Please advise. Thank you.

## 2019-10-12 NOTE — Telephone Encounter (Signed)
Yes she can try Cymbalta reducing the dose to 40 mg.  If she has previous Cymbalta 40 mg then she can take it otherwise please call to her pharmacy of Cymbalta 40 mg.

## 2019-10-12 NOTE — Telephone Encounter (Signed)
Writer left message inform pt that she can decrease dosage of Cymbalta back to 40mg . Rx sent in if needed. Pt instructed to use any 40mg  that she may have leftover at home first but that rx is on file. Pt instructed to call with any questions or concerns.

## 2019-10-13 NOTE — Telephone Encounter (Signed)
yes

## 2019-10-15 ENCOUNTER — Other Ambulatory Visit (HOSPITAL_COMMUNITY): Payer: Self-pay

## 2019-10-15 MED FILL — DULoxetine HCL 20 MG CPEP: 20 | 30 days supply | Qty: 60 | Fill #2

## 2019-10-18 ENCOUNTER — Other Ambulatory Visit (HOSPITAL_COMMUNITY): Payer: Self-pay

## 2019-10-18 MED ORDER — DULOXETINE HCL 20 MG PO CPEP: ORAL_CAPSULE | ORAL | 0 refills | Status: DC

## 2019-10-18 NOTE — Telephone Encounter (Signed)
I am putting this form on your desk with a copy of the last FMLA I completed for her. Pls fill in the form exactly as I did last time, plus add any additional dates of office visits in the appropriate line on form. I put a sticker on the area with the dates to write in for start date and end date of "intermittent leave" section. -thx

## 2019-10-18 NOTE — Telephone Encounter (Signed)
Form completed with additional dates of office visits and faxed to [employer]. Fax # [phone]

## 2019-10-19 ENCOUNTER — Ambulatory Visit (INDEPENDENT_AMBULATORY_CARE_PROVIDER_SITE_OTHER): Payer: 59 | Admitting: Psychiatry

## 2019-10-19 ENCOUNTER — Other Ambulatory Visit: Payer: Self-pay

## 2019-10-19 ENCOUNTER — Encounter (HOSPITAL_COMMUNITY): Payer: Self-pay | Admitting: Psychiatry

## 2019-10-19 DIAGNOSIS — F419 Anxiety disorder, unspecified: Secondary | ICD-10-CM | POA: Diagnosis not present

## 2019-10-19 DIAGNOSIS — F331 Major depressive disorder, recurrent, moderate: Secondary | ICD-10-CM

## 2019-10-19 MED ORDER — DULOXETINE HCL 20 MG PO CPEP: ORAL_CAPSULE | ORAL | 2 refills | Status: DC

## 2019-10-19 NOTE — Progress Notes (Signed)
Virtual Visit via Telephone Note  I connected with Toni [name] on 10/19/19 at  4:00 PM EST by telephone and verified that I am speaking with the correct person using two identifiers.   I discussed the limitations, risks, security and privacy concerns of performing an evaluation and management service by telephone and the availability of in person appointments. I also discussed with the patient that there may be a patient responsible charge related to this service. The patient expressed understanding and agreed to proceed.   History of Present Illness: Patient was evaluated by phone session.  She is taking Cymbalta 20 mg twice a day as she could not tolerate higher dose.  She admitted it is working and she does not feel as bad.  However she is more concerned about her financial situation as her job hours are cut down.  She is sleeping..  She is not as tired or feeling fatigued.  She denies any crying spells or any feeling of hopelessness or worthlessness.  She lives with her 2 children.  She works from home.  She working for [employer] and recently job hours are cut down.  Her sleep is better with the Xanax.  She is no longer taking trazodone.  So far she is tolerating Cymbalta and reported no tremors, shakes or any EPS.  She like to keep the current dose of Cymbalta 20 mg twice a day.  We talked about therapy and patient did contact one of her therapist who recommended IOP and patient is not interested in IOP.  She prefer individual therapy sessions.  Past Psychiatric History: H/O depression after got married.  Tried Zoloft for many years until had seizure and stopped.  PCP tried trazodone did not work.  We tried higher dose of Cymbalta but developed side effects.  H/O irritability, poor impulse control with excessive buying and shopping but no history of mania, psychosis, suicidal attempt or inpatient treatment.     Psychiatric Specialty Exam: Physical Exam  Review of Systems   There were no vitals taken for this visit.There is no height or weight on file to calculate BMI.  General Appearance: NA  Eye Contact:  NA  Speech:  Clear and Coherent  Volume:  Decreased  Mood:  Anxious  Affect:  NA  Thought Process:  Goal Directed  Orientation:  Full (Time, Place, and Person)  Thought Content:  Rumination  Suicidal Thoughts:  No  Homicidal Thoughts:  No  Memory:  Immediate;   Good Recent;   Good Remote;   Good  Judgement:  Intact  Insight:  Present  Psychomotor Activity:  NA  Concentration:  Concentration: Good and Attention Span: Good  Recall:  Good  Fund of Knowledge:  Good  Language:  Good  Akathisia:  No  Handed:  Right  AIMS (if indicated):     Assets:  Communication Skills Desire for Improvement Housing Resilience  ADL's:  Intact  Cognition:  WNL  Sleep:   ok      Assessment and Plan: Major depressive disorder, recurrent.  Anxiety.  Patient back to Cymbalta 20 mg twice a day as 60 mg positive side effects.  She is getting Xanax from PCP it is helping her sleep.  We talked about therapy and willing to consider individual session with therapist eval in IOP.  We will schedule appointment to see a therapist in our office.  Recommended to call us back if she is any question of any concern.  Follow-up in 3 months.  Follow Up Instructions:    I discussed the assessment and treatment plan with the patient. The patient was provided an opportunity to ask questions and all were answered. The patient agreed with the plan and demonstrated an understanding of the instructions.   The patient was advised to call back or seek an in-person evaluation if the symptoms worsen or if the condition fails to improve as anticipated.  I provided 20 minutes of non-face-to-face time during this encounter.   Kathlee Nations, MD

## 2019-10-21 NOTE — Telephone Encounter (Signed)
Verified patient got medication

## 2019-10-22 ENCOUNTER — Other Ambulatory Visit: Payer: Self-pay

## 2019-10-22 ENCOUNTER — Ambulatory Visit (INDEPENDENT_AMBULATORY_CARE_PROVIDER_SITE_OTHER): Payer: 59 | Admitting: Licensed Clinical Social Worker

## 2019-10-22 DIAGNOSIS — F1021 Alcohol dependence, in remission: Secondary | ICD-10-CM

## 2019-10-22 DIAGNOSIS — F331 Major depressive disorder, recurrent, moderate: Secondary | ICD-10-CM | POA: Diagnosis not present
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
## 2019-10-25 ENCOUNTER — Ambulatory Visit (INDEPENDENT_AMBULATORY_CARE_PROVIDER_SITE_OTHER): Payer: 59 | Admitting: Family Medicine

## 2019-10-25 ENCOUNTER — Other Ambulatory Visit: Payer: Self-pay

## 2019-10-25 ENCOUNTER — Encounter: Payer: Self-pay | Admitting: Family Medicine

## 2019-10-25 VITALS — BP 115/64 | HR 75 | Temp 97.3°F | Resp 16 | Ht 65.0 in | Wt 258.4 lb

## 2019-10-25 DIAGNOSIS — I10 Essential (primary) hypertension: Secondary | ICD-10-CM | POA: Diagnosis not present

## 2019-10-25 DIAGNOSIS — F411 Generalized anxiety disorder: Secondary | ICD-10-CM | POA: Diagnosis not present

## 2019-10-25 DIAGNOSIS — G4700 Insomnia, unspecified: Secondary | ICD-10-CM

## 2019-10-25 DIAGNOSIS — E538 Deficiency of other specified B group vitamins: Secondary | ICD-10-CM

## 2019-10-25 DIAGNOSIS — E559 Vitamin D deficiency, unspecified: Secondary | ICD-10-CM | POA: Diagnosis not present

## 2019-10-25 DIAGNOSIS — M542 Cervicalgia: Secondary | ICD-10-CM | POA: Diagnosis not present

## 2019-10-25 DIAGNOSIS — K909 Intestinal malabsorption, unspecified: Secondary | ICD-10-CM | POA: Diagnosis not present

## 2019-10-25 DIAGNOSIS — Z79899 Other long term (current) drug therapy: Secondary | ICD-10-CM | POA: Diagnosis not present

## 2019-10-25 DIAGNOSIS — M62830 Muscle spasm of back: Secondary | ICD-10-CM | POA: Diagnosis not present

## 2019-10-25 DIAGNOSIS — M6250 Muscle wasting and atrophy, not elsewhere classified, unspecified site: Secondary | ICD-10-CM | POA: Diagnosis not present

## 2019-10-25 DIAGNOSIS — R351 Nocturia: Secondary | ICD-10-CM | POA: Diagnosis not present

## 2019-10-25 DIAGNOSIS — Z79891 Long term (current) use of opiate analgesic: Secondary | ICD-10-CM | POA: Diagnosis not present

## 2019-10-25 DIAGNOSIS — G894 Chronic pain syndrome: Secondary | ICD-10-CM | POA: Diagnosis not present

## 2019-10-25 LAB — BASIC METABOLIC PANEL
BUN: 12 mg/dL (ref 6–23)
CO2: 23 mEq/L (ref 19–32)
Calcium: 8.8 mg/dL (ref 8.4–10.5)
Chloride: 102 mEq/L (ref 96–112)
Creatinine, Ser: 0.71 mg/dL (ref 0.40–1.20)
GFR: 108.2 mL/min (ref >60.00)
Glucose, Bld: 90 mg/dL (ref 70–99)
Potassium: 4.1 mEq/L (ref 3.5–5.1)
Sodium: 135 mEq/L (ref 135–145)

## 2019-10-25 LAB — CBC WITH DIFFERENTIAL/PLATELET
Basophils Absolute: 0 10*3/uL (ref 0.0–0.1)
Basophils Relative: 0.4 % (ref 0.0–3.0)
Eosinophils Absolute: 0 10*3/uL (ref 0.0–0.7)
Eosinophils Relative: 0.1 % (ref 0.0–5.0)
HCT: 38.7 % (ref 36.0–46.0)
Hemoglobin: 13.1 g/dL (ref 12.0–15.0)
Lymphocytes Relative: 26.6 % (ref 12.0–46.0)
Lymphs Abs: 2 10*3/uL (ref 0.7–4.0)
MCHC: 33.9 g/dL (ref 30.0–36.0)
MCV: 90.8 fl (ref 78.0–100.0)
Monocytes Absolute: 0.4 10*3/uL (ref 0.1–1.0)
Monocytes Relative: 4.8 % (ref 3.0–12.0)
Neutro Abs: 5 10*3/uL (ref 1.4–7.7)
Neutrophils Relative %: 68.1 % (ref 43.0–77.0)
Platelets: 331 10*3/uL (ref 150.0–400.0)
RBC: 4.26 Mil/uL (ref 3.87–5.11)
RDW: 14.2 % (ref 11.5–15.5)
WBC: 7.4 10*3/uL (ref 4.0–10.5)

## 2019-10-25 LAB — VITAMIN D 25 HYDROXY (VIT D DEFICIENCY, FRACTURES): VITD: 35.79 ng/mL (ref 30.00–100.00)

## 2019-10-25 LAB — VITAMIN B12: Vitamin B-12: 123 pg/mL — ABNORMAL LOW (ref 211–911)

## 2019-10-25 MED ORDER — CYANOCOBALAMIN 1000 MCG/ML IJ SOLN: INTRAMUSCULAR | 0 refills | Status: DC

## 2019-10-25 MED ORDER — CYANOCOBALAMIN 1000 MCG/ML IJ SOLN
1000.0000 ug | Freq: Once | INTRAMUSCULAR | Status: AC
  Administered 2019-10-25: 1000 ug via INTRAMUSCULAR

## 2019-10-25 MED ORDER — VITAMIN D (ERGOCALCIFEROL) 1.25 MG (50000 UNIT) PO CAPS: ORAL_CAPSULE | ORAL | 3 refills | Status: DC

## 2019-10-25 MED ORDER — ALPRAZOLAM 1 MG PO TABS: ORAL_TABLET | ORAL | 5 refills | Status: DC

## 2019-10-25 NOTE — Progress Notes (Signed)
OFFICE VISIT  10/25/2019   CC:  Chief Complaint  Patient presents with  . Follow-up    RCI, pt is doing well with no complaints. Needs B12 injection after labs.     HPI:    Patient is a 44 y.o.  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 A/P as of last visit: "1) HA, likely secondary to uncontrolled HTN.  She also has frequent tension HA's and migraine HA's.  Will continue with toprol xl at the 50mg  qd dosing to see if this persistently helps keep bp better controlled and HA's better. RF'd maxalt today.  2) Anxiety: doing fine on bid alprazolam. RF'd med today. CSC renewed today.  UDS obtained today.  3) Malabsorption syndrome with some episodic hypoglycemia. She eats frequent small meals. I will monitor her vit D level, vit B12 level, iron panel.  4) Health maintenance exam: Reviewed age and gender appropriate health maintenance issues (prudent diet, regular exercise, health risks of tobacco and excessive alcohol, use of seatbelts, fire alarms in home, use of sunscreen).  Also reviewed age and gender appropriate health screening as well as vaccine recommendations. Vaccines: UTD. Labs: recent CBC and BMET wnl.  Ordered hepatic function panel, TSH, vit D, iron panel, and vit B12 level today. Cervical ca screening: has appt with GYN next month to get PAP. Breast ca screening: Due for next mammogram now--to be done via her GYN MD. Colon ca screening: average risk patient= as per latest guidelines, start screening at 45-50 yrs of age."  Interim hx: Has been seeing Dr. in psychiatry since I last saw her.  Also saw counselor x 1 and plans on continuing. She had too much side effect of very realistic dreams on 60mg  cymbalta.  Better on 40mg  cymbalta. I will  Continue with rx of her alprazolam.  She is taking 1 qhs, often has to take a second if wakes up 2 AM and can't get back to sleep.  Takes 1  qAM as well.  Asks for rx to be done to reflect increased need.  Home bp monitoring shows good levels: consistently <130/80. Feeling well physically.  She does have c/o nocturia but no daytime frequency, urgency, or dysuria. She has not tried to limit fluids in evenings.  Has 1 cup coffee qAM but no further caffeine.  She is doing well with eating smaller, more frequent meals through her days and this helps her avoid her feeling of hypoglycemia if she goes too long w/out eating.  PMP AWARE reviewed today: most recent rx for alprazolam 1mg  was filled 10/07/19, # 60, rx by me. No red flags.  ROS: no CP, no SOB, no wheezing, no cough, no dizziness, no HAs, no rashes, no melena/hematochezia.  No polyuria or polydipsia.  No myalgias or arthralgias.  Past Medical History:  Diagnosis Date  . Allergic rhinitis   . 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 Anxiety and depression    started with PPD  . Chronic neck pain    Myofascial pain syndrome per [facility], oxycodone per their office pain med contract.  (Normal cervical MRI 2010 per ortho notes).  . Dietary iron deficiency without anemia   . Easy bruisability 2011   Hx of: saw Hematologist (Dr. ) and w/u neg for von Willebrand desease.  10/09/19 GERD (gastroesophageal reflux disease)    hx gastric ulcer and upper GI bleed, ? NSAID induced  .  History of PCR DNA positive for HSV1   . History of PCR DNA positive for HSV2   . History of stomach ulcers   . Hypertension   . Irregular menses 03/2018   GYN started [facility] 0.25-0.35.  [facility] as of 10/2018.  u/s wnl.  Pt declined endom bx.  . 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 Migraine   . Reactive hypoglycemia    Dr. Loanne Drilling  . Seizure ([place]) 2015   s/p eval with neuro, no medication, no events since  . Syncope    with ? seizure in 2015, eval with neruo  . Vitamin B12 deficiency    Starting monthly vit B12 injections 04/2019  . Vitamin D deficiency      Past Surgical History:  Procedure Laterality Date  . CESAREAN SECTION  2006  . ENDOMETRIAL BIOPSY    . EYE SURGERY Bilateral    2001  . 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 TONSILLECTOMY AND ADENOIDECTOMY    . TUBAL LIGATION  2006  . tubes in ears both Bilateral     Outpatient Medications Prior to Visit  Medication Sig Dispense Refill  . Alcohol Swabs (ALCOHOL WIPES) 70 % PADS Use to clean finger before checking blood sugar, checks blood sugar 3-4 times daily 100 each 11  . Calcium 250 MG CAPS Take 1 capsule by mouth daily.     . cetirizine (ZYRTEC) 10 MG tablet Take 1 tablet (10 mg total) by mouth daily. 90 tablet 3  . DULoxetine (CYMBALTA) 20 MG capsule Take 1 capsule (20mg ) twice a day by mouth daily 60 capsule 2  . fluticasone (FLONASE) 50 MCG/ACT nasal spray PLACE 2 SPRAYS INTO BOTH NOSTRILS DAILY. 16 g 12  . glucose blood test strip Use to check blood sugar three times daily. 100 each 12  . hydrochlorothiazide (HYDRODIURIL) 25 MG tablet TAKE 1 TABLET BY MOUTH DAILY. 30 tablet 5  . metoprolol succinate (TOPROL-XL) 50 MG 24 hr tablet Take 1 tablet (50 mg total) by mouth daily. Take with or immediately following a meal. 90 tablet 3  . MONONESSA 0.25-35 MG-MCG tablet Take 1 tablet by mouth once daily  4  . montelukast (SINGULAIR) 10 MG tablet Take 1 tablet (10 mg total) by mouth at bedtime. 30 tablet 5  . omeprazole (PRILOSEC) 20 MG capsule TAKE 1 CAPSULE BY MOUTH TWICE A DAY BEFORE A MEAL 60 capsule 2  . oxyCODONE-acetaminophen (PERCOCET) 10-325 MG tablet TK 1 T PO QID PRN    . potassium chloride SA (K-DUR) 20 MEQ tablet TAKE 1 TABLET BY MOUTH DAILY. 90 tablet 1  . rizatriptan (MAXALT-MLT) 10 MG disintegrating tablet 1 tab po qd as needed for migraine headache.  May repeat dose in 2 hours if needed.  Max dose in 24h is 20 mg. 10 tablet 5  . ALPRAZolam (XANAX) 1 MG tablet 1 tab po bid prn anxiety 60 tablet 5  . cyanocobalamin (,VITAMIN B-12,) 1000 MCG/ML injection Inject 1 mL  (1,000 mcg total) into the muscle every 30 (thirty) days. 10 mL 0  . Vitamin D, Ergocalciferol, (DRISDOL) 1.25 MG (50000 UT) CAPS capsule TAKE 1 CAPSULE BY MOUTH TWICE WEEKLY 24 capsule 3   No facility-administered medications prior to visit.    Allergies  Allergen Reactions  . Ibuprofen     Causes ulcers    ROS As per HPI  PE: Blood pressure 115/64, pulse 75, temperature (!) 97.3 F (36.3 C), temperature source Temporal, resp. rate 16,  height 5\' 5"  (1.651 m), weight 258 lb 6 oz (117.2 kg), last menstrual period 09/24/2019, SpO2 98 %. Body mass index is 43 kg/m.  Gen: Alert, well appearing.  Patient is oriented to person, place, time, and situation. AFFECT: pleasant, lucid thought and speech. 09/26/2019: no injection, icteris, swelling, or exudate.  EOMI, PERRLA. Mouth: lips without lesion/swelling.  Oral mucosa pink and moist. Oropharynx without erythema, exudate, or swelling.  CV: RRR, no m/r/g.   LUNGS: CTA bilat, nonlabored resps, good aeration in all lung fields. EXT: no clubbing or cyanosis.  no edema.    LABS:  Lab Results  Component Value Date   TSH 0.73 04/01/2019   Lab Results  Component Value Date   WBC 7.3 03/31/2019   HGB 13.9 03/31/2019   HCT 44.0 03/31/2019   MCV 92.1 03/31/2019   PLT 332 03/31/2019   Lab Results  Component Value Date   VITAMINB12 194 (L) 04/01/2019   Lab Results  Component Value Date   IRON 100 04/01/2019   TIBC 481 (H) 04/01/2019   FERRITIN 73 04/01/2019    Lab Results  Component Value Date   CREATININE 0.72 03/31/2019   BUN 19 03/31/2019   NA 138 03/31/2019   K 3.5 03/31/2019   CL 107 03/31/2019   CO2 23 03/31/2019   Lab Results  Component Value Date   ALT 8 04/01/2019   AST 13 04/01/2019   ALKPHOS 64 04/01/2019   BILITOT 0.6 04/01/2019   Lab Results  Component Value Date   CHOL 197 02/06/2018   Lab Results  Component Value Date   HDL 79.80 02/06/2018   Lab Results  Component Value Date   LDLCALC 89  02/06/2018   Lab Results  Component Value Date   TRIG 138.0 02/06/2018   Lab Results  Component Value Date   CHOLHDL 2 02/06/2018   Lab Results  Component Value Date   HGBA1C 4.6 08/07/2017   Vit D level 04/01/19 = 35  IMPRESSION AND PLAN:  1) HTN: The current medical regimen is effective;  continue present plan and medications. BMET today.  2) GAD with anxiety-related insomnia: requiring more alpraz hs to sleep adequately. I am ok with new sig of her alpraz 1mg : 1 tab po qAM and 2 tabs qhs, but she will try to minimize use of the 2nd pill hs. Renew CSC and repeat UDS at next f/u in 10mo. Continue with psychiatrist and psychologist.  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 She is due for her q6 mo IM vit B12 injection today.  4) Nocturia: first step is to limit fluids after supper.  Checking serum glucose today.  An After Visit Summary was printed and given to the patient.  FOLLOW UP: Return in about 6 months (around 04/23/2020) for routine chronic illness f/u.  Signed:  5mo, MD           10/25/2019

## 2019-10-25 NOTE — Addendum Note (Signed)
Addended by: Eulah Pont on: 10/25/2019 12:21 PM   Modules accepted: Orders

## 2019-10-25 NOTE — Patient Instructions (Signed)
Take Magnesium oxide 500 mg otc once a day. Drink 3 oz quinine water after dinner.

## 2019-10-26 LAB — IRON,TIBC AND FERRITIN PANEL
%SAT: 18 % (calc) (ref 16–45)
Ferritin: 13 ng/mL — ABNORMAL LOW (ref 16–232)
Iron: 79 ug/dL (ref 40–190)
TIBC: 438 mcg/dL (calc) (ref 250–450)

## 2019-10-27 ENCOUNTER — Encounter: Payer: Self-pay | Admitting: Family Medicine

## 2019-11-01 MED FILL — ALPRAZolam 1 MG TABS: 1 | 30 days supply | Qty: 90 | Fill #0

## 2019-11-08 ENCOUNTER — Other Ambulatory Visit: Payer: Self-pay | Admitting: Family Medicine

## 2019-11-08 MED FILL — OMEPRAZOLE DR 20 MG CAPSULE: 20 | 30 days supply | Qty: 60 | Fill #0

## 2019-11-08 MED FILL — DULoxetine HCL 20 MG CPEP: 20 | 30 days supply | Qty: 60 | Fill #0

## 2019-11-08 MED FILL — HYDROCHLOROTHIAZIDE 25 MG T: 25 | 30 days supply | Qty: 30 | Fill #1

## 2019-11-26 ENCOUNTER — Other Ambulatory Visit: Payer: Self-pay

## 2019-11-26 MED ORDER — OMEPRAZOLE 20 MG PO CPDR: DELAYED_RELEASE_CAPSULE | ORAL | 1 refills | Status: DC

## 2019-11-26 MED FILL — FEMYNOR 0.25-35 MG-MCG TABS: 0.25-35 | 84 days supply | Qty: 84 | Fill #2

## 2019-11-30 MED FILL — ALPRAZolam 1 MG TABS: 1 | 30 days supply | Qty: 90 | Fill #1

## 2019-12-06 DIAGNOSIS — M7702 Medial epicondylitis, left elbow: Secondary | ICD-10-CM

## 2019-12-06 DIAGNOSIS — M1811 Unilateral primary osteoarthritis of first carpometacarpal joint, right hand: Secondary | ICD-10-CM

## 2019-12-06 HISTORY — DX: Unilateral primary osteoarthritis of first carpometacarpal joint, right hand: M18.11

## 2019-12-06 HISTORY — DX: Medial epicondylitis, left elbow: M77.02

## 2019-12-07 DIAGNOSIS — M79644 Pain in right finger(s): Secondary | ICD-10-CM | POA: Diagnosis not present

## 2019-12-07 DIAGNOSIS — M25529 Pain in unspecified elbow: Secondary | ICD-10-CM | POA: Insufficient documentation

## 2019-12-07 DIAGNOSIS — M25522 Pain in left elbow: Secondary | ICD-10-CM | POA: Diagnosis not present

## 2019-12-07 HISTORY — PX: OTHER SURGICAL HISTORY: SHX169

## 2019-12-08 ENCOUNTER — Other Ambulatory Visit: Payer: Self-pay | Admitting: Family Medicine

## 2019-12-08 MED FILL — HYDROCHLOROTHIAZIDE 25 MG T: 25 | 30 days supply | Qty: 30 | Fill #2

## 2019-12-08 MED FILL — DULoxetine HCL 20 MG CPEP: 20 | 30 days supply | Qty: 60 | Fill #0

## 2019-12-08 MED FILL — OMEPRAZOLE DR 20 MG CAPSULE: 20 | 30 days supply | Qty: 60 | Fill #1

## 2019-12-08 MED FILL — FLUTICASONE PROP 50 MCG SPR: 50 | 30 days supply | Qty: 16 | Fill #0

## 2019-12-08 MED FILL — DICLOFENAC SODIUM 1 % GEL: 1 | 12 days supply | Qty: 100 | Fill #0

## 2019-12-24 ENCOUNTER — Encounter: Payer: Self-pay | Admitting: Family Medicine

## 2019-12-27 MED FILL — ALPRAZolam 1 MG TABS: 1 | 30 days supply | Qty: 90 | Fill #2

## 2020-01-18 ENCOUNTER — Encounter (HOSPITAL_COMMUNITY): Payer: Self-pay | Admitting: Psychiatry

## 2020-01-18 ENCOUNTER — Ambulatory Visit (INDEPENDENT_AMBULATORY_CARE_PROVIDER_SITE_OTHER): Payer: 59 | Admitting: Psychiatry

## 2020-01-18 ENCOUNTER — Other Ambulatory Visit: Payer: Self-pay

## 2020-01-18 DIAGNOSIS — F419 Anxiety disorder, unspecified: Secondary | ICD-10-CM

## 2020-01-18 DIAGNOSIS — F331 Major depressive disorder, recurrent, moderate: Secondary | ICD-10-CM

## 2020-01-18 MED ORDER — LAMOTRIGINE 25 MG PO TABS: ORAL_TABLET | ORAL | 0 refills | Status: DC

## 2020-01-18 MED ORDER — DULOXETINE HCL 20 MG PO CPEP: ORAL_CAPSULE | ORAL | 0 refills | Status: DC

## 2020-01-18 MED FILL — DULoxetine HCL 20 MG CPEP: 20 | 30 days supply | Qty: 60 | Fill #0

## 2020-01-18 MED FILL — lamoTRIgine 25 MG TABS: 25 | 30 days supply | Qty: 60 | Fill #0

## 2020-01-18 NOTE — Progress Notes (Signed)
Virtual Visit via Telephone Note  I connected with Toni [name] on 01/18/20 at  4:00 PM EDT by telephone and verified that I am speaking with the correct person using two identifiers.   I discussed the limitations, risks, security and privacy concerns of performing an evaluation and management service by telephone and the availability of in person appointments. I also discussed with the patient that there may be a patient responsible charge related to this service. The patient expressed understanding and agreed to proceed.   History of Present Illness: Patient was evaluated by phone session.  She is taking Cymbalta 20 mg twice a day as she could not tolerate 60 mg.  She noticed her sleep and anxiety is better but she continues to have irritability, impulsive behavior, depression, fatigue and lack of motivation.  She started therapy with Georgina Snell.  She question that she may have underlying bipolar or obsessive thoughts because sometimes she does have obsession about counting.  She gave example that if she have to drink something that has to be two in number.  Though she denies any obsession about contamination but mostly when she is buying and counting.  She is sleeping better with Cymbalta.  She also takes Xanax prescribed by PCP.  She endorsed going into impulsive behavior and then crashing into depression.  She is pleased that her job hours are back and she is better with paying the bills.  She denies any hallucination, paranoia or any suicidal thoughts.  Her energy level is fair.  Her appetite is okay.  One of her child is staying with her father and other stays with the patient.  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   Past Psychiatric History: H/O depression after got married.  Tried Zoloft for many years but stopped after seizure. PCP tried trazodone did not work.  We tried higher dose of Cymbalta but developed side effects.  H/O irritability, poor impulse control with  excessive buying and shopping but no history of mania, psychosis, suicidal attempt or inpatient treatment.     Psychiatric Specialty Exam: Physical Exam  Review of Systems  There were no vitals taken for this visit.There is no height or weight on file to calculate BMI.  General Appearance: NA  Eye Contact:  NA  Speech:  Clear and Coherent  Volume:  Normal  Mood:  Depressed, Dysphoric and Irritable  Affect:  NA  Thought Process:  Goal Directed  Orientation:  Full (Time, Place, and Person)  Thought Content:  Obsessions and Rumination  Suicidal Thoughts:  No  Homicidal Thoughts:  No  Memory:  Immediate;   Good Recent;   Good Remote;   Good  Judgement:  Intact  Insight:  Present  Psychomotor Activity:  NA  Concentration:  Concentration: Good and Attention Span: Fair  Recall:  [place] of Knowledge:  Good  Language:  Good  Akathisia:  No  Handed:  Right  AIMS (if indicated):     Assets:  Communication Skills Desire for Improvement Housing Transportation  ADL's:  Intact  Cognition:  WNL  Sleep:         Assessment and Plan: Major depressive disorder, recurrent.  Anxiety.  Rule out bipolar disorder.  Discussed to try Lamictal to help the impulsivity and mood symptoms.  She agreed with the plan.  We will try Lamictal 25 mg daily for 1 week and then 50 mg daily.  She will continue Cymbalta 20 mg twice a day.  Discussed that  Lamictal can cause rash and that case she need to stop the medicine immediately.  Encouraged to continue therapy with Denyse Amass.  We will follow up in 4 weeks.  Recommend to call us back if she has any question or any concern.  Follow Up Instructions:    I discussed the assessment and treatment plan with the patient. The patient was provided an opportunity to ask questions and all were answered. The patient agreed with the plan and demonstrated an understanding of the instructions.   The patient was advised to call back or seek an in-person evaluation if the  symptoms worsen or if the condition fails to improve as anticipated.  I provided 20 minutes of non-face-to-face time during this encounter.   Cleotis Nipper, MD

## 2020-01-19 MED FILL — METOPROLOL SUCCINATE ER 50: 50 | 90 days supply | Qty: 90 | Fill #3

## 2020-01-19 MED FILL — OMEPRAZOLE DR 20 MG CAPSULE: 20 | 30 days supply | Qty: 60 | Fill #2

## 2020-01-19 MED FILL — HYDROCHLOROTHIAZIDE 25 MG T: 25 | 30 days supply | Qty: 30 | Fill #3

## 2020-01-25 ENCOUNTER — Other Ambulatory Visit: Payer: Self-pay | Admitting: Family Medicine

## 2020-01-25 ENCOUNTER — Encounter: Payer: Self-pay | Admitting: Family Medicine

## 2020-01-26 ENCOUNTER — Other Ambulatory Visit: Payer: Self-pay

## 2020-01-26 ENCOUNTER — Telehealth (HOSPITAL_COMMUNITY): Payer: Self-pay | Admitting: *Deleted

## 2020-01-26 MED ORDER — MONTELUKAST SODIUM 10 MG PO TABS: 10.0000 mg | ORAL_TABLET | Freq: Every day | ORAL | 5 refills | Status: DC

## 2020-01-26 MED FILL — MONTELUKAST SOD 10 MG TAB: 10 | 30 days supply | Qty: 30 | Fill #0

## 2020-01-26 MED FILL — ALPRAZolam 1 MG TABS: 1 | 30 days supply | Qty: 90 | Fill #3

## 2020-01-26 NOTE — Telephone Encounter (Signed)
Pt called stating that "the new medicine", Lamictal 25mg  qd. Pt has not increased dose to 50 mg yet. Pt states medication is working "too well" and keeping her up until 0200 or 0300 in the morning. Pt asked to decrease Lamictal dose. Writer explained that 25mg  is quite a low dose for mood stabilization , realizing pt is sensitive to various medications. Pt has an upcoming appointment on 02/23/20. Please review and advise.

## 2020-01-26 NOTE — Telephone Encounter (Signed)
She should stay on 25 mg and take in am. Usually side effects subsided in 2-3 weeks. If still persist after another two weeks than call us.

## 2020-01-26 NOTE — Telephone Encounter (Signed)
Writer spoke with pt to relay information from Dr. To stay on the Lamictal 25mg  qd for another 2-3 weeks to see if s/e subsides. Pt verbalizes understanding and agrees. Pt will update this nurse.

## 2020-02-17 ENCOUNTER — Ambulatory Visit (HOSPITAL_COMMUNITY): Payer: 59 | Admitting: Licensed Clinical Social Worker

## 2020-02-17 ENCOUNTER — Other Ambulatory Visit: Payer: Self-pay

## 2020-02-17 ENCOUNTER — Other Ambulatory Visit (HOSPITAL_COMMUNITY): Payer: Self-pay | Admitting: Psychiatry

## 2020-02-17 ENCOUNTER — Other Ambulatory Visit (HOSPITAL_COMMUNITY): Payer: Self-pay | Admitting: *Deleted

## 2020-02-17 DIAGNOSIS — F331 Major depressive disorder, recurrent, moderate: Secondary | ICD-10-CM

## 2020-02-17 MED ORDER — DULOXETINE HCL 20 MG PO CPEP: ORAL_CAPSULE | ORAL | 0 refills | Status: DC

## 2020-02-23 ENCOUNTER — Ambulatory Visit (HOSPITAL_COMMUNITY): Payer: 59 | Admitting: Psychiatry

## 2020-02-23 MED FILL — ALPRAZolam 1 MG TABS: 1 | 30 days supply | Qty: 90 | Fill #4

## 2020-03-01 ENCOUNTER — Other Ambulatory Visit (HOSPITAL_COMMUNITY): Payer: Self-pay | Admitting: *Deleted

## 2020-03-01 ENCOUNTER — Telehealth (INDEPENDENT_AMBULATORY_CARE_PROVIDER_SITE_OTHER): Payer: 59 | Admitting: Psychiatry

## 2020-03-01 ENCOUNTER — Other Ambulatory Visit: Payer: Self-pay

## 2020-03-01 DIAGNOSIS — F331 Major depressive disorder, recurrent, moderate: Secondary | ICD-10-CM | POA: Diagnosis not present

## 2020-03-01 DIAGNOSIS — F419 Anxiety disorder, unspecified: Secondary | ICD-10-CM | POA: Diagnosis not present

## 2020-03-01 MED ORDER — LAMOTRIGINE 25 MG PO TABS: ORAL_TABLET | ORAL | 0 refills | Status: DC

## 2020-03-01 MED ORDER — DULOXETINE HCL 20 MG PO CPEP: ORAL_CAPSULE | ORAL | 0 refills | Status: DC

## 2020-03-01 MED FILL — lamoTRIgine 25 MG TABS: 25 | 90 days supply | Qty: 180 | Fill #0

## 2020-03-01 NOTE — Progress Notes (Signed)
Virtual Visit via Telephone Note  I connected with Toni [name] on 03/01/20 at  3:00 PM EDT by telephone and verified that I am speaking with the correct person using two identifiers.   I discussed the limitations, risks, security and privacy concerns of performing an evaluation and management service by telephone and the availability of in person appointments. I also discussed with the patient that there may be a patient responsible charge related to this service. The patient expressed understanding and agreed to proceed.  Patient location; home Provider location; home office  History of Present Illness: Patient is evaluated by phone session.  Patient reported 2 days ago ago she accidentally took Cymbalta 100 mg and she felt increased anxiety, palpitation, nervousness and could not sleep.  She told accidentally she thought she is taking Prilosec 40 mg but instead she was taking leftover Cymbalta 60 mg with 40 mg at night. Yesterday she did not sleep well but today she is somewhat better.  She still have residual depression and anxiety.  Sometimes she feel lonely because her 11 year old son who lives with her does not communicate as much but she does talk to him every day.  Her other son lives with her father.  She reported her job is full-time now and she is feeling better.  She feels Lamictal helps her motivation and energy and she preferred to take because it does help sleep also.  She tried few days Lamictal in the morning 50 mg and now like to take Lamictal at bedtime.  She is in therapy with Georgina Snell.  She has not taken Xanax since Lamictal started because she feels it is helping her mood irritability and impulsive behavior.  She has chronic pain and she is seeing physician for joint pain.   Past Psychiatric History: H/Odepression after got married. Tried Zoloft for many years but stopped after seizure. PCP tried trazodone did not work. We tried higher dose of Cymbalta but developed side  effects. H/Oirritability, poor impulse control with excessive buying and shopping but no history of mania, psychosis, suicidal attempt or inpatient treatment.   Psychiatric Specialty Exam: Physical Exam  Review of Systems  Musculoskeletal: Positive for joint swelling.    There were no vitals taken for this visit.There is no height or weight on file to calculate BMI.  General Appearance: NA  Eye Contact:  NA  Speech:  Clear and Coherent  Volume:  Normal  Mood:  Euthymic  Affect:  NA  Thought Process:  Goal Directed  Orientation:  Full (Time, Place, and Person)  Thought Content:  WDL  Suicidal Thoughts:  No  Homicidal Thoughts:  No  Memory:  Immediate;   Good Recent;   Good Remote;   Good  Judgement:  Intact  Insight:  Present  Psychomotor Activity:  NA  Concentration:  Concentration: Fair and Attention Span: Fair  Recall:  Good  Fund of Knowledge:  Good  Language:  Good  Akathisia:  No  Handed:  Right  AIMS (if indicated):     Assets:  Communication Skills Desire for Improvement Housing Resilience Transportation  ADL's:  Intact  Cognition:  WNL  Sleep:   fair      Assessment and Plan: Depressive disorder, recurrent.  Anxiety.  Since patient has taken extra dose of Cymbalta 2 days ago I recommend to take only 20 mg of Cymbalta today and tomorrow and then go back to 40 mg as prescribed.  I reassured that symptoms of higher dose of Cymbalta will subsided when  it will come out from the body.  Patient agreed with that and slowly and gradually she is feeling better.  I also agree that she should take Lamictal 50 mg at bedtime since it is helping her sleep and next day she is more fraction motivated.  She has not taken Xanax prescribed by PCP since the last visit.  I feel she is feeling better and we will keep the current medication.  Encouraged to continue therapy with Denyse Amass.  Recommended to call us back if there is any question or any concern.  Follow-up in 3 months.     Follow Up Instructions:    I discussed the assessment and treatment plan with the patient. The patient was provided an opportunity to ask questions and all were answered. The patient agreed with the plan and demonstrated an understanding of the instructions.   The patient was advised to call back or seek an in-person evaluation if the symptoms worsen or if the condition fails to improve as anticipated.  I provided 20 minutes of non-face-to-face time during this encounter.   Cleotis Nipper, MD

## 2020-03-03 ENCOUNTER — Encounter: Payer: Self-pay | Admitting: Family Medicine

## 2020-03-03 ENCOUNTER — Other Ambulatory Visit: Payer: Self-pay

## 2020-03-03 MED ORDER — OMEPRAZOLE 20 MG PO CPDR: DELAYED_RELEASE_CAPSULE | ORAL | 1 refills | Status: DC

## 2020-03-03 MED FILL — OMEPRAZOLE DR 20 MG CAPSULE: 20 | 90 days supply | Qty: 180 | Fill #0

## 2020-03-07 MED FILL — HYDROCHLOROTHIAZIDE 25 MG T: 25 | 30 days supply | Qty: 30 | Fill #4

## 2020-03-08 DIAGNOSIS — G894 Chronic pain syndrome: Secondary | ICD-10-CM | POA: Diagnosis not present

## 2020-03-23 MED FILL — DULoxetine HCL 20 MG CPEP: 20 | 90 days supply | Qty: 180 | Fill #0

## 2020-03-23 MED FILL — ALPRAZolam 1 MG TABS: 1 | 30 days supply | Qty: 90 | Fill #5

## 2020-03-27 ENCOUNTER — Encounter: Payer: Self-pay | Admitting: Family Medicine

## 2020-03-27 NOTE — Telephone Encounter (Signed)
Go to ED

## 2020-03-27 NOTE — Telephone Encounter (Signed)
Pt was called and given information.  

## 2020-04-17 ENCOUNTER — Other Ambulatory Visit: Payer: Self-pay | Admitting: Family Medicine

## 2020-04-17 NOTE — Telephone Encounter (Signed)
RF request for alprazolam 1MG .  Last OV 10/25/19 Next OV 05/15/20 Last RX 10/25/19 # 90 x 5 rfs.  Please advise.

## 2020-04-21 MED FILL — ALPRAZolam 1 MG TABS: 1 | 30 days supply | Qty: 90 | Fill #0

## 2020-04-28 ENCOUNTER — Ambulatory Visit: Payer: 59 | Admitting: Family Medicine

## 2020-05-01 ENCOUNTER — Ambulatory Visit: Payer: 59 | Admitting: Family Medicine

## 2020-05-08 MED FILL — FEMYNOR 0.25-35 MG-MCG TABS: 0.25-35 | 28 days supply | Qty: 28 | Fill #0

## 2020-05-12 ENCOUNTER — Other Ambulatory Visit: Payer: Self-pay

## 2020-05-15 ENCOUNTER — Encounter: Payer: Self-pay | Admitting: Family Medicine

## 2020-05-15 ENCOUNTER — Emergency Department (HOSPITAL_COMMUNITY)
Admission: EM | Admit: 2020-05-15 | Discharge: 2020-05-15 | Disposition: A | Discharge status: Home / Self Care | Payer: 59 | Attending: Emergency Medicine | Admitting: Emergency Medicine

## 2020-05-15 ENCOUNTER — Encounter (HOSPITAL_COMMUNITY): Payer: Self-pay | Admitting: Emergency Medicine

## 2020-05-15 ENCOUNTER — Other Ambulatory Visit: Payer: Self-pay

## 2020-05-15 ENCOUNTER — Ambulatory Visit (INDEPENDENT_AMBULATORY_CARE_PROVIDER_SITE_OTHER): Payer: 59 | Admitting: Family Medicine

## 2020-05-15 VITALS — BP 133/84 | HR 83 | Temp 98.2°F | Resp 18 | Ht 66.0 in | Wt 257.4 lb

## 2020-05-15 DIAGNOSIS — Z8639 Personal history of other endocrine, nutritional and metabolic disease: Secondary | ICD-10-CM

## 2020-05-15 DIAGNOSIS — E162 Hypoglycemia, unspecified: Secondary | ICD-10-CM | POA: Diagnosis not present

## 2020-05-15 DIAGNOSIS — F419 Anxiety disorder, unspecified: Secondary | ICD-10-CM | POA: Diagnosis not present

## 2020-05-15 DIAGNOSIS — R410 Disorientation, unspecified: Secondary | ICD-10-CM | POA: Diagnosis not present

## 2020-05-15 DIAGNOSIS — R300 Dysuria: Secondary | ICD-10-CM | POA: Diagnosis not present

## 2020-05-15 DIAGNOSIS — Z79899 Other long term (current) drug therapy: Secondary | ICD-10-CM | POA: Diagnosis not present

## 2020-05-15 DIAGNOSIS — R61 Generalized hyperhidrosis: Secondary | ICD-10-CM | POA: Diagnosis not present

## 2020-05-15 DIAGNOSIS — E559 Vitamin D deficiency, unspecified: Secondary | ICD-10-CM

## 2020-05-15 DIAGNOSIS — E538 Deficiency of other specified B group vitamins: Secondary | ICD-10-CM | POA: Diagnosis not present

## 2020-05-15 DIAGNOSIS — E611 Iron deficiency: Secondary | ICD-10-CM

## 2020-05-15 DIAGNOSIS — Z5321 Procedure and treatment not carried out due to patient leaving prior to being seen by health care provider: Secondary | ICD-10-CM | POA: Insufficient documentation

## 2020-05-15 DIAGNOSIS — I10 Essential (primary) hypertension: Secondary | ICD-10-CM

## 2020-05-15 DIAGNOSIS — Z9884 Bariatric surgery status: Secondary | ICD-10-CM

## 2020-05-15 DIAGNOSIS — R402411 Glasgow coma scale score 13-15, in the field [EMT or ambulance]: Secondary | ICD-10-CM | POA: Diagnosis not present

## 2020-05-15 DIAGNOSIS — R404 Transient alteration of awareness: Secondary | ICD-10-CM | POA: Diagnosis not present

## 2020-05-15 DIAGNOSIS — I959 Hypotension, unspecified: Secondary | ICD-10-CM | POA: Diagnosis not present

## 2020-05-15 LAB — CBC
HCT: 41.6 % (ref 36.0–46.0)
Hemoglobin: 13.7 g/dL (ref 12.0–15.0)
MCHC: 33 g/dL (ref 30.0–36.0)
MCV: 91.3 fl (ref 78.0–100.0)
Platelets: 269 10*3/uL (ref 150.0–400.0)
RBC: 4.56 Mil/uL (ref 3.87–5.11)
RDW: 13.6 % (ref 11.5–15.5)
WBC: 6.6 10*3/uL (ref 4.0–10.5)

## 2020-05-15 LAB — CBC WITH DIFFERENTIAL/PLATELET
Abs Immature Granulocytes: 0.02 10*3/uL (ref 0.00–0.07)
Basophils Absolute: 0.1 10*3/uL (ref 0.0–0.1)
Basophils Relative: 1 %
Eosinophils Absolute: 0.1 10*3/uL (ref 0.0–0.5)
Eosinophils Relative: 1 %
HCT: 46.9 % — ABNORMAL HIGH (ref 36.0–46.0)
Hemoglobin: 14.3 g/dL (ref 12.0–15.0)
Immature Granulocytes: 0 %
Lymphocytes Relative: 39 %
Lymphs Abs: 4.5 10*3/uL — ABNORMAL HIGH (ref 0.7–4.0)
MCH: 29.5 pg (ref 26.0–34.0)
MCHC: 30.5 g/dL (ref 30.0–36.0)
MCV: 96.7 fL (ref 80.0–100.0)
Monocytes Absolute: 0.7 10*3/uL (ref 0.1–1.0)
Monocytes Relative: 6 %
Neutro Abs: 6.1 10*3/uL (ref 1.7–7.7)
Neutrophils Relative %: 53 %
Platelets: 442 10*3/uL — ABNORMAL HIGH (ref 150–400)
RBC: 4.85 MIL/uL (ref 3.87–5.11)
RDW: 13.1 % (ref 11.5–15.5)
WBC: 11.4 10*3/uL — ABNORMAL HIGH (ref 4.0–10.5)
nRBC: 0 % (ref 0.0–0.2)

## 2020-05-15 LAB — COMPREHENSIVE METABOLIC PANEL
ALT: 13 U/L (ref 0–44)
AST: 14 U/L — ABNORMAL LOW (ref 15–41)
Albumin: 3.7 g/dL (ref 3.5–5.0)
Alkaline Phosphatase: 70 U/L (ref 38–126)
Anion gap: 14 (ref 5–15)
BUN: 19 mg/dL (ref 6–20)
CO2: 17 mmol/L — ABNORMAL LOW (ref 22–32)
Calcium: 9.4 mg/dL (ref 8.9–10.3)
Chloride: 103 mmol/L (ref 98–111)
Creatinine, Ser: 1.74 mg/dL — ABNORMAL HIGH (ref 0.44–1.00)
GFR calc Af Amer: 41 mL/min — ABNORMAL LOW (ref >60)
GFR calc non Af Amer: 35 mL/min — ABNORMAL LOW (ref >60)
Glucose, Bld: 96 mg/dL (ref 70–99)
Potassium: 4.4 mmol/L (ref 3.5–5.1)
Sodium: 134 mmol/L — ABNORMAL LOW (ref 135–145)
Total Bilirubin: 0.5 mg/dL (ref 0.3–1.2)
Total Protein: 7.4 g/dL (ref 6.5–8.1)

## 2020-05-15 LAB — BASIC METABOLIC PANEL
BUN: 11 mg/dL (ref 6–23)
CO2: 24 mEq/L (ref 19–32)
Calcium: 9.3 mg/dL (ref 8.4–10.5)
Chloride: 107 mEq/L (ref 96–112)
Creatinine, Ser: 0.86 mg/dL (ref 0.40–1.20)
GFR: 86.51 mL/min (ref >60.00)
Glucose, Bld: 83 mg/dL (ref 70–99)
Potassium: 4.9 mEq/L (ref 3.5–5.1)
Sodium: 136 mEq/L (ref 135–145)

## 2020-05-15 LAB — CBG MONITORING, ED: Glucose-Capillary: 108 mg/dL — ABNORMAL HIGH (ref 70–99)

## 2020-05-15 LAB — VITAMIN D 25 HYDROXY (VIT D DEFICIENCY, FRACTURES): VITD: 26.29 ng/mL — ABNORMAL LOW (ref 30.00–100.00)

## 2020-05-15 LAB — I-STAT BETA HCG BLOOD, ED (MC, WL, AP ONLY): I-stat hCG, quantitative: <5 m[IU]/mL (ref <5)

## 2020-05-15 LAB — VITAMIN B12: Vitamin B-12: 72 pg/mL — ABNORMAL LOW (ref 211–911)

## 2020-05-15 MED ORDER — ALPRAZOLAM 1 MG PO TABS: ORAL_TABLET | ORAL | 5 refills | Status: DC

## 2020-05-15 MED ORDER — CYANOCOBALAMIN 1000 MCG/ML IJ SOLN
1000.0000 ug | Freq: Once | INTRAMUSCULAR | Status: AC
  Administered 2020-05-15: 1000 ug via INTRAMUSCULAR

## 2020-05-15 MED FILL — ALPRAZolam 1 MG TABS: 1 | 30 days supply | Qty: 120 | Fill #0

## 2020-05-15 NOTE — Progress Notes (Signed)
OFFICE VISIT  05/15/2020   CC:  Chief Complaint  Patient presents with  . Follow-up    low blood sugar running low & medication refills.   HPI:    Patient is a 44 y.o.  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 Iron def anemia, vit B12 def secondary to this malabsorption.  Also hx of anxiety and depression, for which I rx her benzo.  She sees a psychiatrist, Dr. Lolly Mustache for mgmt of other psych meds.  Says hypoglyc more and more a problem. More over the last year.  Eats normal meal and 2 hrs later has gluc approx 60, feels shaky and some foggy thinking.  She then eats and feels back to normal in 30 min or so, gluc check is then in the 80. Has loose BM tid lately since eating more fruit as snacks to keep gluc up.  Otherwise had not been having any signs of hypoglycemia. Endo eval in the past, Dr. Everardo All, prolonged fasting testing confirmed some low sugars, hypogl attributed to her malabsorption from gastric bipass surgery. Has not seen nutritionist for this problem.  Has intermittent feeling of bubbling while eating, feels it substernally, occ diff swallowing during this time, rare spitting up of "bubbly stuff and saliva".  Denies actual burning. Doesn't happen consistently.  Random, not connected to any particular type of food or overeating.  ?possibly from rate at which she eats sometimes.  Says this never happened PRE gastric bipass surg. NO abd pain.  Says anxiety from all the probs her hypoglyc causes for her work is bad, asks if going up to 2 of the alpraz every morning could be done.  PMP AWARE reviewed today: most recent rx for alpraz was filled 04/17/20, # 120, rx by me. No red flags.  ROS: no fevers, no CP, no SOB, no wheezing, no cough, no dizziness, no HAs, no rashes, no melena/hematochezia.  No polyuria or polydipsia.  No myalgias or arthralgias.  No focal weakness,  paresthesias, or tremors.  No acute vision or hearing abnormalities. No n/v/d or abd pain.  No palpitations.    Past Medical History:  Diagnosis Date  . Allergic rhinitis   . 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 Anxiety and depression    started with PPD  . Chronic neck pain    Myofascial pain syndrome per [facility], oxycodone per their office pain med contract.  (Normal cervical MRI 2010 per ortho notes).  . Dietary iron deficiency without anemia   . Easy bruisability 2011   Hx of: saw Hematologist (Dr. Cyndie Chime) and w/u neg for von Willebrand desease.  Marland Kitchen GERD (gastroesophageal reflux disease)    hx gastric ulcer and upper GI bleed, ? NSAID induced  . History of PCR DNA positive for HSV1   . History of PCR DNA positive for HSV2   . History of stomach ulcers   . Hypertension   . Iron deficiency 10/2019   WITHOUT anemia-suspect malabsorption. Pt to start FeSO4 325 qd 10/28/19  . Irregular menses 03/2018   GYN started Fem[facility] 0.25-0.35.  [facility] as of 10/2018.  u/s wnl.  Pt declined endom bx.  . 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 Medial epicondylitis of elbow, left 12/2019   [facility]  . Migraine   . Osteoarthritis of carpometacarpal [facility]) joint of right thumb 12/2019   [facility]  . Reactive hypoglycemia  Dr. Everardo All  . Seizure (HCC) 2015   s/p eval with neuro, no medication, no events since  . Syncope    with ? seizure in 2015, eval with neruo  . Vitamin B12 deficiency    Starting monthly vit B12 injections 04/2019  . Vitamin D deficiency     Past Surgical History:  Procedure Laterality Date  . CESAREAN SECTION  2006  . ELBOW X-RAY Left 12/07/2019  . ENDOMETRIAL BIOPSY    . EYE SURGERY Bilateral    2001  . FINGER X-RAY Right 12/07/2019  . 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 TONSILLECTOMY AND ADENOIDECTOMY    . TUBAL LIGATION  2006  . tubes in ears both Bilateral     Outpatient Medications Prior to Visit   Medication Sig Dispense Refill  . Alcohol Swabs (ALCOHOL WIPES) 70 % PADS Use to clean finger before checking blood sugar, checks blood sugar 3-4 times daily 100 each 11  . Calcium 250 MG CAPS Take 1 capsule by mouth daily.     . cetirizine (ZYRTEC) 10 MG tablet Take 1 tablet (10 mg total) by mouth daily. 90 tablet 3  . clindamycin (CLEOCIN) 2 % vaginal cream clindamycin 2 % vaginal cream  INSERT 1 APPLICATORFUL VAGINALLY ONCE NIGHTLY FOR 7 NIGHTS (Patient not taking: Reported on 05/15/2020)    . cyanocobalamin (,VITAMIN B-12,) 1000 MCG/ML injection 1 ml IM q 4mo in office 10 mL 0  . diclofenac Sodium (VOLTAREN) 1 % GEL Apply 1 application topically 4 (four) times daily. (Patient not taking: Reported on 05/15/2020)    . DULoxetine (CYMBALTA) 20 MG capsule Take 1 capsule (20mg ) twice a day by mouth. 180 capsule 0  . fluticasone (FLONASE) 50 MCG/ACT nasal spray PLACE 2 SPRAYS INTO BOTH NOSTRILS DAILY. 16 g 6  . glucose blood test strip Use to check blood sugar three times daily. 100 each 12  . hydrochlorothiazide (HYDRODIURIL) 25 MG tablet TAKE 1 TABLET BY MOUTH DAILY. 30 tablet 5  . lamoTRIgine (LAMICTAL) 25 MG tablet Take two tab daily at bed time. 180 tablet 0  . metoprolol succinate (TOPROL-XL) 50 MG 24 hr tablet Take 1 tablet (50 mg total) by mouth daily. Take with or immediately following a meal. 90 tablet 3  . MONONESSA 0.25-35 MG-MCG tablet Take 1 tablet by mouth once daily  4  . montelukast (SINGULAIR) 10 MG tablet Take 1 tablet (10 mg total) by mouth at bedtime. 30 tablet 5  . omeprazole (PRILOSEC) 20 MG capsule TAKE 1 CAPSULE BY MOUTH TWICE A DAY BEFORE A MEAL. 180 capsule 1  . oxyCODONE-acetaminophen (PERCOCET) 10-325 MG tablet TK 1 T PO QID PRN    . potassium chloride SA (K-DUR) 20 MEQ tablet TAKE 1 TABLET BY MOUTH DAILY. 90 tablet 1  . rizatriptan (MAXALT-MLT) 10 MG disintegrating tablet 1 tab po qd as needed for migraine headache.  May repeat dose in 2 hours if needed.  Max dose in 24h  is 20 mg. 10 tablet 5  . Vitamin D, Ergocalciferol, (DRISDOL) 1.25 MG (50000 UNIT) CAPS capsule TAKE 1 CAPSULE BY MOUTH WEEKLY 24 capsule 3  . ALPRAZolam (XANAX) 1 MG tablet TAKE 1 TABLET BY MOUTH IN THE MORNING & 2 TABLETS AT BEDTIME 90 tablet 0   No facility-administered medications prior to visit.    Allergies  Allergen Reactions  . Ibuprofen     Causes ulcers    ROS As per HPI  PE: Vitals with BMI 05/15/2020 10/25/2019 04/01/2019  Height 5\' 6"   5\' 5"  5\' 5"   Weight 257 lbs 6 oz 258 lbs 6 oz 248 lbs 13 oz  BMI 41.57 43 41.4  Systolic 133 115  Diastolic 84 64 81  Pulse 83 75 88  Some encounter information is confidential and restricted. Go to Review Flowsheets activity to see all data.  O2 sat on RA is 100% today  Gen: Alert, well appearing.  Patient is oriented to person, place, time, and situation. AFFECT: pleasant, lucid thought and speech. CV: RRR, no m/r/g.   LUNGS: CTA bilat, nonlabored resps, good aeration in all lung fields. EXT: no clubbing or cyanosis.  No pitting edema.    LABS:  Lab Results  Component Value Date   TSH 0.73 04/01/2019   Lab Results  Component Value Date   WBC 7.4 10/25/2019   HGB 13.1 10/25/2019   HCT 38.7 10/25/2019   MCV 90.8 10/25/2019   PLT 331.0 10/25/2019   Lab Results  Component Value Date   IRON 79 10/25/2019   TIBC 438 10/25/2019   FERRITIN 13 (L) 10/25/2019   Lab Results  Component Value Date   CREATININE 0.71 10/25/2019   BUN 12 10/25/2019   NA 135 10/25/2019   K 4.1 10/25/2019   CL 102 10/25/2019   CO2 23 10/25/2019   Lab Results  Component Value Date   ALT 8 04/01/2019   AST 13 04/01/2019   ALKPHOS 64 04/01/2019   BILITOT 0.6 04/01/2019   Lab Results  Component Value Date   CHOL 197 02/06/2018   Lab Results  Component Value Date   HDL 79.80 02/06/2018   Lab Results  Component Value Date   LDLCALC 89 02/06/2018   Lab Results  Component Value Date   TRIG 138.0 02/06/2018   Lab Results   Component Value Date   CHOLHDL 2 02/06/2018   Lab Results  Component Value Date   VITAMINB12 123 (L) 10/25/2019    IMPRESSION AND PLAN:  1) Recurrent symptomatic hypoglycemia: secondary to dumping/malabsorption from gastric bypass. More problematic gradually over the last 32yr, pt wanting to seek disability b/c of how much this is affecting her. Nutritionist referral to see anything diff can be done to avoid/diminish hypoglycemia. She already has had good habit of eating about q2h. Recheck insulin and C peptid levels as well as BMET today.  She declines 2nd endocrinologist opinion (saw Dr. 10/27/2019 about 3 yrs ago) at this time. Regarding other malabs testing: monitor Vit B12 level and give b12 inj 1000 mcg today.  Monitoring iron and vit D today.  2) Chronic anxiety: will increase alpraz 1mg  to TWO qAM and she will continue TWO qPM. UDS today: 3yr should be present. Last took oxycdone Everardo All, ortho) couple days ago. CSC updated today. Xanax rx today.  3) HTN: The current medical regimen is effective;  continue present plan and medications. Lytes/cr today.  An After Visit Summary was printed and given to the patient.  FOLLOW UP: Return in about 6 months (around 11/15/2020) for annual CPE (fasting).  Signed:  Rebeca Allegra, MD           05/15/2020
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
## 2020-05-15 NOTE — ED Notes (Signed)
Secretary informed this NT that this patient left. IV still in place. Patient called. Patient states staff will have to come to her residence to remove IV because her ride was waiting on her. Consulting civil engineer, Engineer, materials, and off duty officer informed.

## 2020-05-16 LAB — IRON,TIBC AND FERRITIN PANEL
%SAT: 17 % (calc) (ref 16–45)
Ferritin: 4 ng/mL — ABNORMAL LOW (ref 16–232)
Iron: 81 ug/dL (ref 40–190)
TIBC: 486 mcg/dL (calc) — ABNORMAL HIGH (ref 250–450)

## 2020-05-16 LAB — INSULIN AND C-PEPTIDE, SERUM
C-Peptide: 2.4 ng/mL (ref 1.1–4.4)
INSULIN: 6.7 u[IU]/mL (ref 2.6–24.9)

## 2020-05-17 ENCOUNTER — Other Ambulatory Visit: Payer: Self-pay | Admitting: Family Medicine

## 2020-05-17 ENCOUNTER — Other Ambulatory Visit: Payer: Self-pay

## 2020-05-17 MED ORDER — VITAMIN D (ERGOCALCIFEROL) 1.25 MG (50000 UNIT) PO CAPS: ORAL_CAPSULE | ORAL | 3 refills | Status: DC

## 2020-05-17 MED FILL — VIT D2 1.25 MG (50,000 UNIT: 1.25 MG | 84 days supply | Qty: 24 | Fill #0

## 2020-05-22 ENCOUNTER — Other Ambulatory Visit: Payer: Self-pay

## 2020-05-22 ENCOUNTER — Ambulatory Visit (INDEPENDENT_AMBULATORY_CARE_PROVIDER_SITE_OTHER): Payer: 59

## 2020-05-22 DIAGNOSIS — E538 Deficiency of other specified B group vitamins: Secondary | ICD-10-CM | POA: Diagnosis not present

## 2020-05-22 MED ORDER — CYANOCOBALAMIN 1000 MCG/ML IJ SOLN
1000.0000 ug | Freq: Once | INTRAMUSCULAR | Status: AC
  Administered 2020-05-22: 1000 ug via INTRAMUSCULAR

## 2020-05-22 NOTE — Progress Notes (Signed)
Toni [name] is a 44 y.o. female presents to the office today for B12 injections, per physician's orders. Original order: 05/15/20 "needs vit B12 replaced more aggressively: come in for vit B12 1000 mcg IM q week x 4 weeks, then 1000 mcg IM q 2 wks indefinitely."  Cyanocobalamin , 1000 mcg,  IM was administered left deltoid today. Patient tolerated injection. Patient due for follow up labs/provider appt: No. Date due: n/a, appt made No Patient next injection due: 05/29/20, appt made Yes  [name]  Dr. Claiborne Billings, please review and sign off in Dr. Samul Dada absence please.

## 2020-05-29 ENCOUNTER — Ambulatory Visit: Payer: 59

## 2020-05-30 ENCOUNTER — Telehealth: Payer: Self-pay

## 2020-05-30 DIAGNOSIS — Z20828 Contact with and (suspected) exposure to other viral communicable diseases: Secondary | ICD-10-CM | POA: Diagnosis not present

## 2020-05-30 NOTE — Telephone Encounter (Signed)
FYI  Please see below

## 2020-05-30 NOTE — Telephone Encounter (Signed)
Noted  

## 2020-05-30 NOTE — Telephone Encounter (Signed)
FYI-Patient is experiencing fatigue with a sore throat. She was exposed to COVID + family member. Patient is vaccinated. Patient is going to [facility] today at 2:30 to be tested.

## 2020-06-01 ENCOUNTER — Encounter (HOSPITAL_COMMUNITY): Payer: Self-pay | Admitting: Psychiatry

## 2020-06-01 ENCOUNTER — Other Ambulatory Visit: Payer: Self-pay

## 2020-06-01 ENCOUNTER — Telehealth (INDEPENDENT_AMBULATORY_CARE_PROVIDER_SITE_OTHER): Payer: 59 | Admitting: Psychiatry

## 2020-06-01 VITALS — Wt 254.0 lb

## 2020-06-01 DIAGNOSIS — F331 Major depressive disorder, recurrent, moderate: Secondary | ICD-10-CM

## 2020-06-01 DIAGNOSIS — F419 Anxiety disorder, unspecified: Secondary | ICD-10-CM | POA: Diagnosis not present

## 2020-06-01 MED ORDER — LAMOTRIGINE 25 MG PO TABS: ORAL_TABLET | ORAL | 1 refills | Status: DC

## 2020-06-01 MED ORDER — DULOXETINE HCL 20 MG PO CPEP: ORAL_CAPSULE | ORAL | 0 refills | Status: DC

## 2020-06-01 MED FILL — FEMYNOR 0.25-35 MG-MCG TABS: 0.25-35 | 84 days supply | Qty: 84 | Fill #0

## 2020-06-01 MED FILL — lamoTRIgine 25 MG TABS: 25 | 30 days supply | Qty: 90 | Fill #0

## 2020-06-01 NOTE — Progress Notes (Signed)
Virtual Visit via Telephone Note  I connected with Toni [name] on 06/01/20 at  8:40 AM EDT by telephone and verified that I am speaking with the correct person using two identifiers.  Location: Patient: Home Provider: Home office   I discussed the limitations, risks, security and privacy concerns of performing an evaluation and management service by telephone and the availability of in person appointments. I also discussed with the patient that there may be a patient responsible charge related to this service. The patient expressed understanding and agreed to proceed.   History of Present Illness: Patient is evaluated by phone session.  She reported feeling sick.  She mentioned she had exposed to her family member who has COVID positive and now she feels the same symptoms.  She had test but the results are pending.  Patient reported lately having a lot of anxiety and obsessing.  She has been eating cantaloupe every day and she is obsessed about that.  She admitted weight gain in recent weeks.  She did not specify any stressors but admitted her job is stressful and she is out of work.  She also feels lonely because her son is now living with father.  His older son is living with him.  Patient told school started and she has been very busy.  She also endorses that certain situations make her anxious and depressed but overall denies any crying spells, feeling of hopelessness or worthlessness.  She also endorsed not able to connect with Denyse Amass for therapy.  She has chronic pain and she is scheduled to see nutritionist for dietary consult.  She has not checked her hemoglobin A1c recently.  She has no rash, itching, tremors or shakes.  She denies any paranoia or any hallucination.    Past Psychiatric History: H/Odepression after got married. Tried Zoloft for many yearsbut stopped afterseizure.PCP tried trazodone did not work. We tried higher dose of Cymbalta but developed side effects.  H/Oirritability, poor impulse control with excessive buying and shopping but no history of mania, psychosis, suicidal attempt or inpatient treatment.  Psychiatric Specialty Exam: Physical Exam  Review of Systems  Weight 254 lb (115.2 kg), last menstrual period 05/11/2020.There is no height or weight on file to calculate BMI.  General Appearance: NA  Eye Contact:  NA  Speech:  Slow  Volume:  Normal  Mood:  Anxious and Dysphoric  Affect:  NA  Thought Process:  Goal Directed  Orientation:  Full (Time, Place, and Person)  Thought Content:  Rumination  Suicidal Thoughts:  No  Homicidal Thoughts:  No  Memory:  Immediate;   Good Recent;   Good Remote;   Good  Judgement:  Intact  Insight:  Present  Psychomotor Activity:  NA  Concentration:  Concentration: Fair and Attention Span: Fair  Recall:  Good  Fund of Knowledge:  Good  Language:  Good  Akathisia:  No  Handed:  Right  AIMS (if indicated):     Assets:  Communication Skills Desire for Improvement Housing Resilience Social Support Transportation  ADL's:  Intact  Cognition:  WNL  Sleep:   ok      Assessment and Plan: Major depressive disorder, recurrent.  Anxiety.  Discuss weight gain.  She is obsessing about eating cantaloupe.  Discussed to watch her calorie intake and patient has appointment to see nutritionist.  We had tried Cymbalta higher dose in the past but it did not work.  She like to Lamictal.  I recommend to take the Lamictal 75 mg daily  since it is not causing any side effects.  Continue Cymbalta 20 mg twice a day.  She is getting Xanax from PCP.  I also encouraged that she should restart therapy with Denyse Amass to help her anxiety symptoms.  She agreed with the plan.  Recommended to call us back if is any question or any concern.  Follow-up in 2 months.  Follow Up Instructions:    I discussed the assessment and treatment plan with the patient. The patient was provided an opportunity to ask questions and all  were answered. The patient agreed with the plan and demonstrated an understanding of the instructions.   The patient was advised to call back or seek an in-person evaluation if the symptoms worsen or if the condition fails to improve as anticipated.  I provided 20 minutes of non-face-to-face time during this encounter.   Cleotis Nipper, MD

## 2020-06-05 ENCOUNTER — Other Ambulatory Visit: Payer: Self-pay

## 2020-06-05 ENCOUNTER — Other Ambulatory Visit: Payer: Self-pay | Admitting: Family Medicine

## 2020-06-05 ENCOUNTER — Ambulatory Visit (INDEPENDENT_AMBULATORY_CARE_PROVIDER_SITE_OTHER): Payer: 59

## 2020-06-05 DIAGNOSIS — E538 Deficiency of other specified B group vitamins: Secondary | ICD-10-CM

## 2020-06-05 MED ORDER — CYANOCOBALAMIN 1000 MCG/ML IJ SOLN
1000.0000 ug | Freq: Once | INTRAMUSCULAR | Status: AC
  Administered 2020-06-05: 1000 ug via INTRAMUSCULAR

## 2020-06-05 MED FILL — DULoxetine HCL 20 MG CPEP: 20 | 90 days supply | Qty: 180 | Fill #0

## 2020-06-05 NOTE — Progress Notes (Addendum)
Toni [name] is a 44 y.o. female presents to the office today for B12 injections, per physician's orders. Original order: 05/15/20 "needs vit B12 replaced more aggressively: come in for vit B12 1000 mcg IM q week x 4 weeks, then 1000 mcg IM q 2 wks indefinitely."  Cyanocobalamin , 1000 mcg,  IM was administered left deltoid today. Patient tolerated injection. Patient due for follow up labs/provider appt: No. Date due: n/a, appt made No Patient next injection due: 06/13/2020, appt made Yes  [name]

## 2020-06-05 NOTE — Telephone Encounter (Signed)
Pt wants to know where else she can give shot. She is thinking of doing them herself but is unsure. I have scheduled her missed dose for 06/13/20

## 2020-06-05 NOTE — Telephone Encounter (Signed)
Pls clarify: does she want to know what body locations are options for the injection or does she want to know if there is another office, etc where she could get the injection??

## 2020-06-06 ENCOUNTER — Other Ambulatory Visit: Payer: Self-pay | Admitting: Family Medicine

## 2020-06-06 MED FILL — VIT D2 1.25 MG (50,000 UNIT: 1.25 MG | 84 days supply | Qty: 24 | Fill #0

## 2020-06-06 MED FILL — FREESTYLE LITE TEST STRIP: 66 days supply | Qty: 200 | Fill #0

## 2020-06-06 MED FILL — OMEPRAZOLE DR 20 MG CAPSULE: 20 | 30 days supply | Qty: 60 | Fill #1

## 2020-06-06 MED FILL — HYDROCHLOROTHIAZIDE 25 MG T: 25 | 30 days supply | Qty: 30 | Fill #0

## 2020-06-06 MED FILL — CYANOCOBALAMIN 1,000 MCG/ML: 1000 | 28 days supply | Qty: 1 | Fill #0

## 2020-06-06 MED FILL — METOPROLOL SUCCINATE ER 50: 50 | 90 days supply | Qty: 90 | Fill #0

## 2020-06-06 NOTE — Telephone Encounter (Signed)
Must be given in large muscle such as gluteal (buttocks) or lateral aspect of thigh.  No other options. I authorized the b12 rx.

## 2020-06-06 NOTE — Telephone Encounter (Signed)
I do apologize for not specifying. She wants to know what other body locations she can use to give herself her B12 injections. She wants to be as safe as possible.

## 2020-06-13 ENCOUNTER — Other Ambulatory Visit: Payer: Self-pay

## 2020-06-13 ENCOUNTER — Ambulatory Visit (INDEPENDENT_AMBULATORY_CARE_PROVIDER_SITE_OTHER): Payer: 59

## 2020-06-13 ENCOUNTER — Other Ambulatory Visit: Payer: Self-pay | Admitting: Family Medicine

## 2020-06-13 ENCOUNTER — Encounter: Payer: Self-pay | Admitting: Family Medicine

## 2020-06-13 DIAGNOSIS — E538 Deficiency of other specified B group vitamins: Secondary | ICD-10-CM | POA: Diagnosis not present

## 2020-06-13 MED ORDER — CYANOCOBALAMIN 1000 MCG/ML IJ SOLN
1000.0000 ug | Freq: Once | INTRAMUSCULAR | Status: AC
  Administered 2020-06-13: 1000 ug via INTRAMUSCULAR

## 2020-06-13 MED ORDER — CYANOCOBALAMIN 1000 MCG/ML IJ SOLN: INTRAMUSCULAR | 0 refills | Status: DC

## 2020-06-13 MED ORDER — CYANOCOBALAMIN 1000 MCG/ML IJ SOLN: INTRAMUSCULAR | 1 refills | Status: DC

## 2020-06-13 MED FILL — CYANOCOBALAMIN 1,000 MCG/ML: 1000 | 21 days supply | Qty: 3 | Fill #0

## 2020-06-13 MED FILL — ALPRAZolam 1 MG TABS: 1 | 30 days supply | Qty: 120 | Fill #1

## 2020-06-13 NOTE — Telephone Encounter (Signed)
OK, 2 rx's sent in. Pick up the one for 1 ml q 7d x 3 weeks first. When these 3 injections have been done then she should pick up the rx "on hold" for the 89ml q2 wk injection that she will take indefinitely.-thx

## 2020-06-13 NOTE — Progress Notes (Signed)
Toni Goodmanis a 44 y.o.femalepresents to the office today for B12injections, per physician's orders. Original order:05/15/20 "needs vit B12 replaced more aggressively: come in for vit B12 1000 mcg IM q week x 4 weeks, then 1000 mcg IM q 2 wks indefinitely."  Cyanocobalamin,1000 mcg,IMwas administered right deltoidtoday. Patient tolerated injection. Patient due for follow up labs/provider appt:No. Date due:n/a, appt [facility] Patient next injection due:N/A, appt [place]  [name]

## 2020-06-15 ENCOUNTER — Encounter: Payer: Self-pay | Admitting: Family Medicine

## 2020-06-15 MED ORDER — OMEPRAZOLE 40 MG PO CPDR: 40.0000 mg | DELAYED_RELEASE_CAPSULE | Freq: Every day | ORAL | 3 refills | Status: DC

## 2020-06-15 NOTE — Telephone Encounter (Signed)
OK, increased to omeprazole 40mg  once a day

## 2020-06-20 NOTE — Telephone Encounter (Signed)
Pt wants to know if her omeprazole can be updated to 40 mg BID

## 2020-06-22 ENCOUNTER — Other Ambulatory Visit: Payer: Self-pay | Admitting: Family Medicine

## 2020-06-22 ENCOUNTER — Ambulatory Visit (HOSPITAL_COMMUNITY): Payer: 59 | Admitting: Licensed Clinical Social Worker

## 2020-06-22 MED ORDER — OMEPRAZOLE 40 MG PO CPDR: DELAYED_RELEASE_CAPSULE | ORAL | 3 refills | Status: DC

## 2020-06-22 MED FILL — OMEPRAZOLE 40 MG CPDR: 40 | 90 days supply | Qty: 180 | Fill #0

## 2020-06-22 NOTE — Telephone Encounter (Signed)
My fault. I sent in omeprazole 40mg  bid just now.-thx

## 2020-06-23 ENCOUNTER — Other Ambulatory Visit: Payer: Self-pay

## 2020-06-23 ENCOUNTER — Ambulatory Visit (HOSPITAL_COMMUNITY): Payer: 59 | Admitting: Licensed Clinical Social Worker

## 2020-06-23 ENCOUNTER — Telehealth (HOSPITAL_COMMUNITY): Payer: Self-pay | Admitting: Licensed Clinical Social Worker

## 2020-06-23 NOTE — Telephone Encounter (Signed)
Toni [name] had a virtual therapy appointment scheduled today, but did not present on time as scheduled.  Clinician contacted her by phone when she did not respond to email and text reminders sent through [facility].  Toni [name] did not answer this phone call, so clinician left a voicemail reminding her of appointment, and provided contact number for office phone, as well as front desk number if she needed to reschedule instead.  Clinician ended virtual session at 10:15am when Toni [name] had still not presented for appointment, and informed front desk staff of no-show event.    Noralee Stain, LCSW, LCAS 06/23/20

## 2020-06-27 ENCOUNTER — Other Ambulatory Visit: Payer: Self-pay

## 2020-06-27 ENCOUNTER — Ambulatory Visit (INDEPENDENT_AMBULATORY_CARE_PROVIDER_SITE_OTHER): Payer: 59 | Admitting: Licensed Clinical Social Worker

## 2020-06-27 DIAGNOSIS — F1021 Alcohol dependence, in remission: Secondary | ICD-10-CM | POA: Diagnosis not present

## 2020-06-27 DIAGNOSIS — F429 Obsessive-compulsive disorder, unspecified: Secondary | ICD-10-CM

## 2020-06-27 DIAGNOSIS — F331 Major depressive disorder, recurrent, moderate: Secondary | ICD-10-CM

## 2020-06-27 MED FILL — CYANOCOBALAMIN 1,000 MCG/ML: 1000 | 84 days supply | Qty: 6 | Fill #0

## 2020-06-27 NOTE — Progress Notes (Signed)
Virtual Visit via Video Note   I connected with Cecil Cranker on 06/27/20 at 11:00am by video enabled telemedicine application and verified that I am speaking with the correct person using two identifiers.   I discussed the limitations, risks, security and privacy concerns of performing an evaluation and management service by telephone and the availability of in person appointments. I also discussed with the patient that there may be a patient responsible charge related to this service. The patient expressed understanding and agreed to proceed.   I discussed the assessment and treatment plan with the patient. The patient was provided an opportunity to ask questions and all were answered. The patient agreed with the plan and demonstrated an understanding of the instructions.   The patient was advised to call back or seek an in-person evaluation if the symptoms worsen or if the condition fails to improve as anticipated.   I provided 45 minutes of non-face-to-face time during this encounter.     Shade Flood, LCSW, LCAS __________________________ THERAPIST PROGRESS NOTE   Session Time: 11:00am - 11:45am  Location: Patient: Patient Home Provider: [facility] [place] [facility]    Participation Level: Active   Behavioral Response: Alert, casually dressed, anxious mood/affect   Type of Therapy:  Individual Therapy   Treatment Goals addressed: Medication management; Anxiety and depression management   Interventions: CBT   Summary: Nona Gracey is a 44 year old African American female who presented for virtual session today with diagnoses of Major depressive disorder, recurrent, moderate, without psychotic features; Obsessive Compulsive Disorder, unspecified; and Alcohol Use Disorder, moderate, in early remission.     Suicidal/Homicidal: None; without plan or intent    Therapist Response: Clinician met with Pansey for virtual session and assessed for safety, sobriety, and medication compliance. Nakeya  presented for session on time and was alert, oriented x5, with no evidence or self-report of SI/HI or A/V H.  Sholonda reported that she has remained compliant with medication and stated "They help me tremendously".  Lashondra denied any use of alcohol or illicit substances.  Clinician inquired about Faron's emotional ratings today, as well as any significant changes in thoughts, feelings, or behaviors since assessment was originally completed.  Temica reported scores of 5/10 for both depression, and anxiety today and stated "I'm beginning to connect the dots with some problems I've been dealing with".  Shaquinta opened up about how she has begun to look more closely at her behavior and how this is influenced by stress, so she is more open to believing she has Obsessive Compulsive Disorder.  Clinician assessed Smt for relevant criteria related to disorder, including presence of obsessions, compulsions she has engaged in to reduce related anxiety, along with timeline, and overall impact on functioning.  Coree reported that she tends to obsess over finances, keeping a strict schedule, and interactions with her supports, and when stress is particularly high or she cannot maintain structure, she becomes hyper focused upon engaging in specific behaviors several times per day until impact of stressors decline.  Idali reported that she tends to cycle through particular behaviors, such as drinking alcohol, coffee, and most recently, eating cantaloupe 4-5x per day.  Christalyn acknowledged that this can become costly at times and disruptive to her daily routine and health depending on the vice, and has been a pattern since she was a young child.  Clinician added diagnosis as a result of this conversation, and encouraged Takiera to begin journaling throughout the week to more closely monitor her stress levels and explore  alternative coping skills that be implemented to avoid reliance on compulsions which complicate stress further.   Interventions were effective, as evidenced by Rosiland reporting that discussion on the subject helped her better conceptualize the nature of her issue, how it is impacting her mood throughout each day, and steps she can take to begin resolving this.  Tura stated "Knowing is half the battle, so I am going to try journaling so I can understand my triggers and do something different".  Clinician will continue to monitor.     Plan: Follow up again virtually in 1 week.    Diagnosis: Major depressive disorder, recurrent, moderate, without psychotic features; Obsessive Compulsive Disorder, unspecified; and Alcohol Use Disorder, moderate, in early remission   Shade Flood, [place], LCAS 06/27/20

## 2020-06-28 ENCOUNTER — Ambulatory Visit: Payer: 59 | Admitting: Skilled Nursing Facility1

## 2020-07-04 ENCOUNTER — Other Ambulatory Visit: Payer: Self-pay

## 2020-07-04 ENCOUNTER — Telehealth (HOSPITAL_COMMUNITY): Payer: Self-pay | Admitting: Licensed Clinical Social Worker

## 2020-07-04 ENCOUNTER — Ambulatory Visit (HOSPITAL_COMMUNITY): Payer: 59 | Admitting: Licensed Clinical Social Worker

## 2020-07-04 NOTE — Telephone Encounter (Signed)
Adaiah had a virtual therapy appointment scheduled today at 11am.  Clinician contacted her at 11:04am when she did not respond to email and text invitations for meeting.  Clinician was forced to leave voicemail reminding her of appointment, and included contact numbers for office phone and front desk staff.  Clinician ended virtual session at 11:15am when Louella had still not responded to inquiries, and informed front desk staff of no-show event.    Noralee Stain, LCSW, LCAS 07/04/20

## 2020-07-06 MED FILL — lamoTRIgine 25 MG TABS: 25 | 30 days supply | Qty: 90 | Fill #1

## 2020-07-07 ENCOUNTER — Ambulatory Visit: Payer: 59 | Admitting: Registered"

## 2020-07-07 DIAGNOSIS — Z79899 Other long term (current) drug therapy: Secondary | ICD-10-CM | POA: Diagnosis not present

## 2020-07-07 DIAGNOSIS — Z5181 Encounter for therapeutic drug level monitoring: Secondary | ICD-10-CM | POA: Diagnosis not present

## 2020-07-09 ENCOUNTER — Telehealth: Payer: 59 | Admitting: Orthopedic Surgery

## 2020-07-09 ENCOUNTER — Other Ambulatory Visit: Payer: Self-pay | Admitting: Physician Assistant

## 2020-07-09 DIAGNOSIS — J029 Acute pharyngitis, unspecified: Secondary | ICD-10-CM

## 2020-07-09 MED ORDER — FLUCONAZOLE 150 MG PO TABS
ORAL_TABLET | ORAL | 0 refills | Status: DC
Start: 2020-07-09 — End: 2020-07-20

## 2020-07-09 MED ORDER — PENICILLIN V POTASSIUM 500 MG PO TABS: 500.0000 mg | ORAL_TABLET | Freq: Two times a day (BID) | ORAL | 0 refills | Status: DC

## 2020-07-09 NOTE — Progress Notes (Signed)
We are sorry that you are not feeling well.  Here is how we plan to help!  Based on what you have shared with me it is likely that you have strep pharyngitis.  Strep pharyngitis is inflammation and infection in the back of the throat.  This is an infection cause by bacteria and is treated with antibiotics.  I have prescribed Penicillin V 500 mg twice a day for 10 days. For throat pain, we recommend over the counter oral pain relief medications such as acetaminophen or aspirin, or anti-inflammatory medications such as ibuprofen or naproxen sodium. Topical treatments such as oral throat lozenges or sprays may be used as needed. Strep infections are not as easily transmitted as other respiratory infections, however we still recommend that you avoid close contact with loved ones, especially the very young and elderly.  Remember to wash your hands thoroughly throughout the day as this is the number one way to prevent the spread of infection and wipe down door knobs and counters with disinfectant.   Home Care:  Only take medications as instructed by your medical team.  Complete the entire course of an antibiotic.  Do not take these medications with alcohol.  A steam or ultrasonic humidifier can help congestion.  You can place a towel over your head and breathe in the steam from hot water coming from a faucet.  Avoid close contacts especially the very young and the elderly.  Cover your mouth when you cough or sneeze.  Always remember to wash your hands.  Get Help Right Away If:  You develop worsening fever or sinus pain.  You develop a severe head ache or visual changes.  Your symptoms persist after you have completed your treatment plan.  Make sure you  Understand these instructions.  Will watch your condition.  Will get help right away if you are not doing well or get worse.  Your e-visit answers were reviewed by a board certified advanced clinical practitioner to complete your  personal care plan.  Depending on the condition, your plan could have included both over the counter or prescription medications.  If there is a problem please reply  once you have received a response from your provider.  Your safety is important to us.  If you have drug allergies check your prescription carefully.    You can use MyChart to ask questions about today's visit, request a non-urgent call back, or ask for a work or school excuse for 24 hours related to this e-Visit. If it has been greater than 24 hours you will need to follow up with your provider, or enter a new e-Visit to address those concerns.  You will get an e-mail in the next two days asking about your experience.  I hope that your e-visit has been valuable and will speed your recovery. Thank you for using e-visits.   Greater than 5 minutes, yet less than 10 minutes of time have been spent researching, coordinating and implementing care for this patient today.   

## 2020-07-09 NOTE — Addendum Note (Signed)
Addended by: Curlene Dolphin, Dametrius Sanjuan TODD on: 07/09/2020 04:27 PM   Modules accepted: Orders

## 2020-07-10 ENCOUNTER — Telehealth: Payer: 59 | Admitting: Physician Assistant

## 2020-07-10 ENCOUNTER — Other Ambulatory Visit: Payer: Self-pay | Admitting: Physician Assistant

## 2020-07-10 ENCOUNTER — Telehealth (HOSPITAL_COMMUNITY): Payer: 59 | Admitting: Psychiatry

## 2020-07-10 DIAGNOSIS — J029 Acute pharyngitis, unspecified: Secondary | ICD-10-CM

## 2020-07-10 MED ORDER — PENICILLIN V POTASSIUM 500 MG PO TABS: 500.0000 mg | ORAL_TABLET | Freq: Two times a day (BID) | ORAL | 0 refills | Status: DC

## 2020-07-10 MED FILL — PENICILLIN VK 500 MG TABLET: 500 | 10 days supply | Qty: 20 | Fill #0

## 2020-07-10 MED FILL — FLUCONAZOLE 150 MG TABS: 150 | 3 days supply | Qty: 2 | Fill #0

## 2020-07-10 NOTE — Progress Notes (Signed)
Prescription was sent to the pharmacy.   NOTE: If you entered your credit card information for this eVisit, you will not be charged. You may see a "hold" on your card for the $35 but that hold will drop off and you will not have a charge processed.   If you are having a true medical emergency please call 911.      For an urgent face to face visit, [place] has five urgent care centers for your convenience:      NEW:  [facility] at [facility] Directions [phone] [address] [place], [place] [address] . 10 am - 6pm Monday - Friday    [facility] Urgent Care Center [facility]) Get Driving Directions [phone] [address] [place], [place] [address] . 10 am to 8 pm Monday-Friday . 12 pm to 8 pm [facility] Urgent Care at [facility] Get Driving Directions [phone] [address] [place], [place] [address] . 8 am to 8 pm Monday-Friday . 9 am to 6 pm Saturday . 11 am to 6 pm Sunday     [facility] Health Urgent Care at [facility] Get Driving Directions  [phone] [address] [place], [place] [address] . 8 am to 8 pm Monday-Friday . 8 am to 4 pm [facility] Urgent Care at [facility] Directions [phone] [address] [place], [place] [address] . 12 pm to 6 pm Monday-Friday      Your e-visit answers were reviewed by a board certified advanced clinical practitioner to complete your personal care plan.  Thank you for using e-Visits.   Approximately 5 minutes was spent documenting and reviewing patient's chart.

## 2020-07-10 NOTE — Progress Notes (Signed)
Your prescription has been sent to the pharmacy.   NOTE: If you entered your credit card information for this eVisit, you will not be charged. You may see a "hold" on your card for the $35 but that hold will drop off and you will not have a charge processed.   If you are having a true medical emergency please call 911.      For an urgent face to face visit, [place] has five urgent care centers for your convenience:      NEW:  [facility] Health Urgent Care Center at [facility] Directions [phone] [address] [place], [place] [address] . 10 am - 6pm Monday - Friday    [facility] Urgent Care Center [facility]) Get Driving Directions [phone] [address] [place], [place] [address] . 10 am to 8 pm Monday-Friday . 12 pm to 8 pm [facility] Urgent Care at [facility] Get Driving Directions [phone] [address] [place], [place] [address] . 8 am to 8 pm Monday-Friday . 9 am to 6 pm Saturday . 11 am to 6 pm Sunday     [facility] Health Urgent Care at [facility] Get Driving Directions  [phone] [address] [place], [place] [address] . 8 am to 8 pm Monday-Friday . 8 am to 4 pm [facility] Urgent Care at [facility] Directions [phone] [address] [place], [place] [address] . 12 pm to 6 pm Monday-Friday      Your e-visit answers were reviewed by a board certified advanced clinical practitioner to complete your personal care plan.  Thank you for using e-Visits.   Approximately 5 minutes was spent documenting and reviewing patient's chart.

## 2020-07-10 NOTE — Addendum Note (Signed)
Addended by: Karrie Meres on: 07/10/2020 08:51 AM   Modules accepted: Orders

## 2020-07-11 ENCOUNTER — Other Ambulatory Visit: Payer: Self-pay

## 2020-07-11 ENCOUNTER — Ambulatory Visit (INDEPENDENT_AMBULATORY_CARE_PROVIDER_SITE_OTHER): Payer: 59 | Admitting: Licensed Clinical Social Worker

## 2020-07-11 DIAGNOSIS — F429 Obsessive-compulsive disorder, unspecified: Secondary | ICD-10-CM

## 2020-07-11 DIAGNOSIS — F331 Major depressive disorder, recurrent, moderate: Secondary | ICD-10-CM | POA: Diagnosis not present

## 2020-07-11 DIAGNOSIS — F1021 Alcohol dependence, in remission: Secondary | ICD-10-CM | POA: Diagnosis not present

## 2020-07-11 MED FILL — ALPRAZolam 1 MG TABS: 1 | 30 days supply | Qty: 120 | Fill #2

## 2020-07-11 NOTE — Progress Notes (Signed)
Virtual Visit via Telephone Note   I connected with Drinda Butts on 07/11/20 at 11:07am by telephone and verified that I am speaking with the correct person using two identifiers.   I discussed the limitations, risks, security and privacy concerns of performing an evaluation and management service by telephone and the availability of in person appointments. I also discussed with the patient that there may be a patient responsible charge related to this service. The patient expressed understanding and agreed to proceed.   I discussed the assessment and treatment plan with the patient. The patient was provided an opportunity to ask questions and all were answered. The patient agreed with the plan and demonstrated an understanding of the instructions.   The patient was advised to call back or seek an in-person evaluation if the symptoms worsen or if the condition fails to improve as anticipated.   I provided 45 minutes of non-face-to-face time during this encounter.     Noralee Stain, LCSW, LCAS __________________________ THERAPIST PROGRESS NOTE   Session Time: 11:07am - 11:52am  Location: Patient: Patient Home Provider: [facility]    Participation Level: Active   Behavioral Response: Alert, anxious mood   Type of Therapy:  Individual Therapy   Treatment Goals addressed: Medication management; Anxiety and depression management   Interventions: CBT, healthy boundaries, assertive communication    Summary: Toni [name] is a 44 year old African American female who presented for telephone session today with diagnoses of Major depressive disorder, recurrent, moderate, without psychotic features; Obsessive Compulsive Disorder, unspecified; and Alcohol Use Disorder, moderate, in early remission.     Suicidal/Homicidal: None; without plan or intent    Therapist Response: Clinician outreached Toni [name] by phone when she did not present for virtual session on time.  Trecia reported that she had  lost track of time, and was then unable to access virtual meeting by text or email, so clinician held telephone session instead and assessed for safety, sobriety, and medication compliance. Toni [name] spoke in a manner that was alert, oriented x5, with no evidence or self-report of SI/HI or A/V H.  Toni [name] reported ongoing compliance with medication and denied any use of alcohol or illicit substances.  Clinician inquired about Toni [name]'s current emotional ratings, as well as any significant changes in thoughts, feelings, or behaviors since last session.  Toni [name] reported scores of 5/10 for depression, and 6/10 for anxiety today and stated "I've been dealing with strep throat and there has been a lot going on that hasn't helped".  Toni [name] opened up about how a family member asked her to take custody/caretaker duties over for a pair of children in previous week and she initially agreed to before later regretting this, stating "I was put on this spot and didn't think it through".  Clinician inquired about the pros and cons of this impulsive decision and how it has impacted her mental health.  Toni [name] reported that the only pro was helping out family during crisis, while there were numerous cons, including financial strain, disruption to schedule, decrease in self-care time, increased stress leading to blood sugar drops, and more.  Clinician discussed the importance of delaying major decisions before careful consideration is given to benefits/costs, managing healthy boundaries with supports to protect one's mental health, along with assertive communication approaches for requesting change when faced with unfavorable outcomes.  Toni [name] reported that she is recognizing that she needs to put her foot down more often and avoid letting guilt guide her decisions, noting that she recently addressed her  son's lack of contribution to the household and communication patterns, which has led to a positive change in their interactions since then.   Interventions were effective, as evidenced by Toni [name] reporting that she felt some relief from getting the chance to open up about recent struggles and process underlying feelings, stating "I know I need to put me first right now, especially if I'm going to bounce back from this strep and keeping working".  Clinician encouraged Toni [name] to set aside self-care activities for stress outlet this week and will continue to monitor.     Plan: Follow up again virtually in 1 week.    Diagnosis: Major depressive disorder, recurrent, moderate, without psychotic features; Obsessive Compulsive Disorder, unspecified; and Alcohol Use Disorder, moderate, in early remission   Noralee Stain, LCSW, LCAS 07/11/20

## 2020-07-18 ENCOUNTER — Other Ambulatory Visit: Payer: Self-pay

## 2020-07-18 ENCOUNTER — Telehealth (HOSPITAL_COMMUNITY): Payer: 59 | Admitting: Psychiatry

## 2020-07-20 ENCOUNTER — Other Ambulatory Visit: Payer: Self-pay

## 2020-07-20 ENCOUNTER — Ambulatory Visit (INDEPENDENT_AMBULATORY_CARE_PROVIDER_SITE_OTHER): Payer: 59 | Admitting: Psychiatry

## 2020-07-20 ENCOUNTER — Encounter (HOSPITAL_COMMUNITY): Payer: Self-pay | Admitting: Psychiatry

## 2020-07-20 ENCOUNTER — Other Ambulatory Visit (HOSPITAL_COMMUNITY): Payer: Self-pay | Admitting: Psychiatry

## 2020-07-20 VITALS — Wt 250.0 lb

## 2020-07-20 DIAGNOSIS — F331 Major depressive disorder, recurrent, moderate: Secondary | ICD-10-CM | POA: Diagnosis not present

## 2020-07-20 DIAGNOSIS — F419 Anxiety disorder, unspecified: Secondary | ICD-10-CM

## 2020-07-20 DIAGNOSIS — F429 Obsessive-compulsive disorder, unspecified: Secondary | ICD-10-CM

## 2020-07-20 MED ORDER — LAMOTRIGINE 100 MG PO TABS: ORAL_TABLET | ORAL | 0 refills | Status: DC

## 2020-07-20 MED ORDER — DULOXETINE HCL 20 MG PO CPEP: ORAL_CAPSULE | ORAL | 0 refills | Status: DC

## 2020-07-20 MED FILL — lamoTRIgine 100 MG TABS: 100 | 90 days supply | Qty: 90 | Fill #0

## 2020-07-20 NOTE — Progress Notes (Signed)
Virtual Visit via Telephone Note  I connected with Toni [name] on 07/20/20 at  4:00 PM EDT by telephone and verified that I am speaking with the correct person using two identifiers.  Location: Patient: home Provider: home office   I discussed the limitations, risks, security and privacy concerns of performing an evaluation and management service by telephone and the availability of in person appointments. I also discussed with the patient that there may be a patient responsible charge related to this service. The patient expressed understanding and agreed to proceed.   History of Present Illness: Patient is evaluated by phone session.  On the last visit we increased Lamictal to 75 mg and she noticed improvement in her impulsivity but lately she is under a lot of stress because she is taking care of her cousins 51-year-old and 70-year-old kids.  Patient told her cousin is incarcerated and mother of the kids are currently in the hospital and may go for long-term rehab.  She feel a lot of responsibility and sometimes she gets stressed out.  She reported that she still have impulsivity and irritability but denies any drinking since the last visit.  She started therapy with Denyse Amass.  She is sleeping with the help of Xanax provided by PCP.  She is working from home but sometimes she feel overwhelmed with extra responsibilities.  She had told the mother that she may not continue taking care of the kids responsibility and kids may go to foster care.  Patient feels bad about the decision but she like to focus on her own health.  Her 41 year old son is living with her who started college.  Her younger son is living with the father and she tried to be in touch with her frequently.  Patient has a history of alcohol use but since the last visit she is not drinking.  Sometimes she does have craving but she feels overall helping her impulsivity with the help of medication and therapy.  Her obsessive thoughts are slowly  and gradually less intensify and she is no longer had obsession about the food.  Sometimes she does have urges to spend money but lately she does not have any plans to spend extra.  She like Lamictal.  She has no rash, itching tremors or shakes.  Her appetite is okay.  Her weight is stable.   Past Psychiatric History: H/Odepression after got married. Tried Zoloft for many yearsbut stopped afterseizure.PCP tried trazodone did not work. We tried higher dose of Cymbalta but developed side effects. H/Oirritability, poor impulse control with excessive buying and shopping but no history of mania, psychosis, suicidal attempt or inpatient treatment  Psychiatric Specialty Exam: Physical Exam  Review of Systems  Weight 250 lb (113.4 kg).There is no height or weight on file to calculate BMI.  General Appearance: NA  Eye Contact:  NA  Speech:  Slow  Volume:  Decreased  Mood:  Anxious, Depressed and impulsivity  Affect:  NA  Thought Process:  Goal Directed  Orientation:  Full (Time, Place, and Person)  Thought Content:  Rumination  Suicidal Thoughts:  No  Homicidal Thoughts:  No  Memory:  Immediate;   Good Recent;   Good Remote;   Good  Judgement:  Intact  Insight:  Present  Psychomotor Activity:  NA  Concentration:  Concentration: Fair and Attention Span: Fair  Recall:  Good  Fund of Knowledge:  Good  Language:  Good  Akathisia:  No  Handed:  Right  AIMS (if indicated):  Assets:  Communication Skills Desire for Improvement Housing Resilience  ADL's:  Intact  Cognition:  WNL  Sleep:   ok      Assessment and Plan: Major depressive disorder, recurrent.  Obsessive-compulsive disorder.  Anxiety.  Patient doing better but recent stressors as taking care of cousins children had made her more anxious and overwhelmed.  She is hoping that she did not have to take care the responsibility for a longer time as kids may go to foster care.  We discussed increasing Lamictal dose and  she agreed with that.  Continue Cymbalta 20 mg twice a day, increase Lamictal 100 mg daily.  Patient is getting Xanax 1 mg from her PCP that she takes at bedtime and second as needed when she feels very anxious.  Discussed medication side effects and benefits.  Encouraged to keep therapy with Denyse Amass.  Recommended to call us back if she has any question or any concern.  Follow-up in 3 months.  Follow Up Instructions:    I discussed the assessment and treatment plan with the patient. The patient was provided an opportunity to ask questions and all were answered. The patient agreed with the plan and demonstrated an understanding of the instructions.   The patient was advised to call back or seek an in-person evaluation if the symptoms worsen or if the condition fails to improve as anticipated.  I provided 25 minutes of non-face-to-face time during this encounter.   Cleotis Nipper, MD

## 2020-07-31 ENCOUNTER — Encounter: Payer: Self-pay | Admitting: Family Medicine

## 2020-07-31 ENCOUNTER — Other Ambulatory Visit: Payer: Self-pay | Admitting: Family Medicine

## 2020-07-31 MED ORDER — MONONESSA 0.25-35 MG-MCG PO TABS: 1.0000 | ORAL_TABLET | Freq: Every day | ORAL | 3 refills | Status: DC

## 2020-07-31 NOTE — Telephone Encounter (Signed)
OK, OCP eRx'd as per pt request.

## 2020-08-09 MED FILL — ALPRAZolam 1 MG TABS: 1 | 30 days supply | Qty: 120 | Fill #3

## 2020-08-16 ENCOUNTER — Ambulatory Visit (INDEPENDENT_AMBULATORY_CARE_PROVIDER_SITE_OTHER): Payer: 59 | Admitting: Licensed Clinical Social Worker

## 2020-08-16 ENCOUNTER — Other Ambulatory Visit: Payer: Self-pay

## 2020-08-16 DIAGNOSIS — F331 Major depressive disorder, recurrent, moderate: Secondary | ICD-10-CM | POA: Diagnosis not present

## 2020-08-16 DIAGNOSIS — F1021 Alcohol dependence, in remission: Secondary | ICD-10-CM | POA: Diagnosis not present

## 2020-08-16 DIAGNOSIS — G894 Chronic pain syndrome: Secondary | ICD-10-CM | POA: Diagnosis not present

## 2020-08-16 DIAGNOSIS — F429 Obsessive-compulsive disorder, unspecified: Secondary | ICD-10-CM

## 2020-08-16 NOTE — Progress Notes (Signed)
Virtual Visit via Telephone Note   I connected with Toni [name] on 08/16/20 at 2:00pm by telephone and verified that I am speaking with the correct person using two identifiers.   I discussed the limitations, risks, security and privacy concerns of performing an evaluation and management service by telephone and the availability of in person appointments. I also discussed with the patient that there may be a patient responsible charge related to this service. The patient expressed understanding and agreed to proceed.   I discussed the assessment and treatment plan with the patient. The patient was provided an opportunity to ask questions and all were answered. The patient agreed with the plan and demonstrated an understanding of the instructions.   The patient was advised to call back or seek an in-person evaluation if the symptoms worsen or if the condition fails to improve as anticipated.   I provided 45 minutes of non-face-to-face time during this encounter.     Toni Stain, LCSW, LCAS __________________________ THERAPIST PROGRESS NOTE   Session Time: 2:00pm - 2:45pm  Location: Patient: Patient Home Provider: [facility]    Participation Level: Active   Behavioral Response: Alert, anxious mood   Type of Therapy:  Individual Therapy   Treatment Goals addressed: Medication management; Anxiety and depression management   Interventions: CBT, stages of grief   Summary: Toni [name] is a 44 year old African American female who presented for telephone session today with diagnoses of Major depressive disorder, recurrent, moderate, without psychotic features; Obsessive Compulsive Disorder, unspecified; and Alcohol Use Disorder, moderate, in early remission.     Suicidal/Homicidal: None; without plan or intent    Therapist Response: Clinician outreached Toni [name] by phone, as she continues to struggle with accessing virtual sessions.  Clinician assessed for safety, sobriety, and  medication compliance. Toni [name] answered phone call on time and spoke in a manner that was alert, oriented x5, with no evidence or self-report of SI/HI or A/V H.  Toni [name] reported that she continues taking medication as prescribed and denied any use of alcohol or illicit substances.  Clinician inquired about Toni [name]'s emotional ratings today, as well as any significant changes in thoughts, feelings, or behaviors since previous check-in.  Toni [name] reported scores of 4/10 for depression, and 6/10 for anxiety today and stated "There are some things that have happened and I've been trying really hard to focus on the positives".  Toni [name] reported that she had an aunt that passed away whom she was close to, and is still grieving this loss.  Clinician provided time for Toni [name] to open up about her relationship with this aunt, how difficult it was to say goodbye, and discussed stages of grief, including denial, depression, bargaining, anger, and acceptance to help her determine where she is in process, and how to cope more effectively.  Toni [name] reported that she is no longer in denial, but struggling heavily with depression, guilt, and regret, and became tearful in session.  Clinician encouraged Toni [name] to stay close to her family during this time of grief, and consider engagement in community grief and loss support group available weekly through [facility].  Interventions were effective, as evidenced by Toni [name] reporting that by discussing this loss, she was able to express some difficult emotions she has been holding in, gain an alternative perspective on her grief, and ideas on how to transition towards stage of acceptance with time. Toni [name] stated "I'm glad I was able to be there with her and ease her mind, tell her I loved  her and everything would be okay".  She reported that she would also prioritize spending more time with family and find a church to attend so she can avoid isolation.  Clinician will continue to monitor.      Plan: Follow up again virtually in 1 week.    Diagnosis: Major depressive disorder, recurrent, moderate, without psychotic features; Obsessive Compulsive Disorder, unspecified; and Alcohol Use Disorder, moderate, in early remission   Toni [name], [place], LCAS 08/16/20

## 2020-08-22 MED FILL — FEMYNOR 0.25-35 MG-MCG TABS: 0.25-35 | 84 days supply | Qty: 84 | Fill #0

## 2020-08-24 ENCOUNTER — Ambulatory Visit: Payer: 59 | Admitting: Skilled Nursing Facility1

## 2020-09-06 MED FILL — ALPRAZolam 1 MG TABS: 1 | 30 days supply | Qty: 120 | Fill #4

## 2020-09-12 ENCOUNTER — Telehealth: Payer: Self-pay

## 2020-09-12 MED FILL — CYANOCOBALAMIN 1,000 MCG/ML: 1000 | 84 days supply | Qty: 6 | Fill #1

## 2020-09-12 NOTE — Telephone Encounter (Signed)
Received FMLA forms to be completed placed on PCP desk for completion.

## 2020-09-13 ENCOUNTER — Other Ambulatory Visit: Payer: Self-pay | Admitting: Family Medicine

## 2020-09-13 ENCOUNTER — Other Ambulatory Visit (HOSPITAL_COMMUNITY): Payer: Self-pay | Admitting: Chiropractic Medicine

## 2020-09-13 DIAGNOSIS — G894 Chronic pain syndrome: Secondary | ICD-10-CM | POA: Diagnosis not present

## 2020-09-13 MED FILL — DULoxetine HCL 20 MG CPEP: 20 | 90 days supply | Qty: 180 | Fill #0

## 2020-09-13 MED FILL — METOPROLOL SUCCINATE ER 50: 50 | 90 days supply | Qty: 90 | Fill #0

## 2020-09-13 MED FILL — OMEPRAZOLE 40 MG CPDR: 40 | 90 days supply | Qty: 180 | Fill #1

## 2020-09-13 MED FILL — HYDROCHLOROTHIAZIDE 25 MG T: 25 | 30 days supply | Qty: 30 | Fill #1

## 2020-09-13 MED FILL — GABAPENTIN 100 MG CAPSULE: 100 | 30 days supply | Qty: 90 | Fill #0

## 2020-09-18 DIAGNOSIS — Z0279 Encounter for issue of other medical certificate: Secondary | ICD-10-CM

## 2020-09-19 ENCOUNTER — Telehealth: Payer: Self-pay

## 2020-09-19 NOTE — Telephone Encounter (Signed)
Received accommodations paperwork for patient needing completion by 12/23 to start process.

## 2020-09-20 NOTE — Telephone Encounter (Signed)
Placed on PCP desk to review and sign, if appropriate.  

## 2020-09-22 ENCOUNTER — Other Ambulatory Visit (HOSPITAL_COMMUNITY): Payer: Self-pay | Admitting: Obstetrics and Gynecology

## 2020-09-22 DIAGNOSIS — Z3040 Encounter for surveillance of contraceptives, unspecified: Secondary | ICD-10-CM | POA: Diagnosis not present

## 2020-09-22 DIAGNOSIS — Z1231 Encounter for screening mammogram for malignant neoplasm of breast: Secondary | ICD-10-CM | POA: Diagnosis not present

## 2020-09-22 DIAGNOSIS — Z113 Encounter for screening for infections with a predominantly sexual mode of transmission: Secondary | ICD-10-CM | POA: Diagnosis not present

## 2020-09-22 DIAGNOSIS — N898 Other specified noninflammatory disorders of vagina: Secondary | ICD-10-CM | POA: Diagnosis not present

## 2020-09-22 DIAGNOSIS — R399 Unspecified symptoms and signs involving the genitourinary system: Secondary | ICD-10-CM | POA: Diagnosis not present

## 2020-09-22 DIAGNOSIS — Z01419 Encounter for gynecological examination (general) (routine) without abnormal findings: Secondary | ICD-10-CM | POA: Diagnosis not present

## 2020-09-22 DIAGNOSIS — Z6841 Body Mass Index (BMI) 40.0 and over, adult: Secondary | ICD-10-CM | POA: Diagnosis not present

## 2020-09-22 LAB — HM MAMMOGRAPHY

## 2020-09-22 MED FILL — metroNIDAZOLE 500 MG TABS: 500 | 7 days supply | Qty: 14 | Fill #0

## 2020-09-26 NOTE — Telephone Encounter (Signed)
Forms completed, faxed to number provided [phone] Attn:Andre Laural Benes, copy made for chart record, e-mailed a copy to patient and will mail original to patient as requested.

## 2020-09-27 LAB — HM MAMMOGRAPHY

## 2020-10-02 ENCOUNTER — Other Ambulatory Visit (HOSPITAL_COMMUNITY): Payer: Self-pay | Admitting: Obstetrics and Gynecology

## 2020-10-02 MED FILL — FLUCONAZOLE 150 MG TABS: 150 | 3 days supply | Qty: 2 | Fill #0

## 2020-10-04 MED FILL — ALPRAZolam 1 MG TABS: 1 | 30 days supply | Qty: 120 | Fill #5

## 2020-10-12 ENCOUNTER — Other Ambulatory Visit (HOSPITAL_COMMUNITY): Payer: Self-pay | Admitting: Chiropractic Medicine

## 2020-10-12 MED FILL — GABAPENTIN 100 MG CAPSULE: 100 | 30 days supply | Qty: 90 | Fill #0

## 2020-10-18 ENCOUNTER — Telehealth (INDEPENDENT_AMBULATORY_CARE_PROVIDER_SITE_OTHER): Payer: 59 | Admitting: Psychiatry

## 2020-10-18 ENCOUNTER — Other Ambulatory Visit (HOSPITAL_COMMUNITY): Payer: Self-pay | Admitting: Psychiatry

## 2020-10-18 ENCOUNTER — Other Ambulatory Visit: Payer: Self-pay

## 2020-10-18 ENCOUNTER — Encounter (HOSPITAL_COMMUNITY): Payer: Self-pay | Admitting: Psychiatry

## 2020-10-18 VITALS — Wt 245.0 lb

## 2020-10-18 DIAGNOSIS — F331 Major depressive disorder, recurrent, moderate: Secondary | ICD-10-CM | POA: Diagnosis not present

## 2020-10-18 DIAGNOSIS — F429 Obsessive-compulsive disorder, unspecified: Secondary | ICD-10-CM | POA: Diagnosis not present

## 2020-10-18 DIAGNOSIS — F4321 Adjustment disorder with depressed mood: Secondary | ICD-10-CM

## 2020-10-18 DIAGNOSIS — F419 Anxiety disorder, unspecified: Secondary | ICD-10-CM | POA: Diagnosis not present

## 2020-10-18 MED ORDER — DULOXETINE HCL 20 MG PO CPEP: ORAL_CAPSULE | ORAL | 0 refills | Status: DC

## 2020-10-18 MED ORDER — LAMOTRIGINE 150 MG PO TABS: 150.0000 mg | ORAL_TABLET | Freq: Every day | ORAL | 0 refills | Status: DC

## 2020-10-18 MED FILL — lamoTRIgine 150 MG TABS: 150 | 30 days supply | Qty: 30 | Fill #0

## 2020-10-18 NOTE — Progress Notes (Signed)
Virtual Visit via Telephone Note  I connected with Toni [name] on 10/18/20 at  4:00 PM EST by telephone and verified that I am speaking with the correct person using two identifiers.  Location: Patient: Home Provider: Home Office   I discussed the limitations, risks, security and privacy concerns of performing an evaluation and management service by telephone and the availability of in person appointments. I also discussed with the patient that there may be a patient responsible charge related to this service. The patient expressed understanding and agreed to proceed.   History of Present Illness: Patient is evaluated by phone socially.  She is on the phone by herself.  Patient told that for past 2 months she has been struggling with depression with lack of motivation and desire to do things.  Patient armed passed away in 09/11/2023 due to health conditions and she was involved in the decision to stop the oxygen and she feels guilty about it.  She endorses lack of motivation to do things.  She has guilt but denies any suicidal thoughts.  She was also raising her cousins kids but now they are moved to foster care and her cousin decided to get rehab.  Patient told that due to her depression she has been erratic to take her medication and not eating on time and her blood sugar fluctuates.  She is concerned about her finances as not working and behind her bills.  Her 61 year old son moved out in September 11, 2023 but her 61 year old son who was living with his father is now staying most of the time.  Patient denies any paranoia, panic attack.  She is concerned about her depression as some days she does not go out of the bed and having lately more headaches.  She visited [facility] for therapy and that helped.  She is taking Cymbalta and Lamictal at bedtime.  Her sleep is okay but she ruminates and grief about losing her aunt who she was very close to.  She has no rash or itching from the medication.  Her obsessive  thoughts are stable with the Cymbalta.  In the past she took a higher dose of Cymbalta but it caused her side effects.  She is anxious about her finances.  She is taking Xanax 1 mg prescribed by PCP which also helps her sleep.  Past Psychiatric History: H/Odepression after got married. Tried Zoloft for many yearsbut stopped afterseizure.PCP tried trazodone did not work. We tried higher dose of Cymbalta but developed side effects. H/Oirritability, poor impulse control with excessive buying and shopping but no history of mania, psychosis, suicidal attempt or inpatient treatment  Psychiatric Specialty Exam: Physical Exam  Review of Systems  Weight 245 lb (111.1 kg).There is no height or weight on file to calculate BMI.  General Appearance: NA  Eye Contact:  NA  Speech:  Slow  Volume:  Decreased  Mood:  Anxious, Depressed and Dysphoric  Affect:  NA  Thought Process:  Goal Directed  Orientation:  Full (Time, Place, and Person)  Thought Content:  Rumination  Suicidal Thoughts:  No  Homicidal Thoughts:  No  Memory:  Immediate;   Good Recent;   Good Remote;   Good  Judgement:  Intact  Insight:  Present  Psychomotor Activity:  NA  Concentration:  Concentration: Fair and Attention Span: Fair  Recall:  Good  Fund of Knowledge:  Good  Language:  Good  Akathisia:  No  Handed:  Right  AIMS (if indicated):     Assets:  Communication  Skills Desire for Improvement Housing Talents/Skills Transportation  ADL's:  Intact  Cognition:  WNL  Sleep:   ok      Assessment and Plan: Major depressive disorder, recurrent.  OCD.  Anxiety.  Grief.  Discussed recent loss of her aunt who she was very close to and guilt that she has to make the decision to end her life.  I recommended she can try Lamictal 150 mg to take in the morning since she already taking Cymbalta and Xanax at bedtime.  Hopefully that will help her energy.  I also encouraged she should have more visit with Denyse Amass to address  her grief.  So far she is tolerating medicines without any side effects.  If increase Lamictal did not help her we may consider switching her medication.  Discussed safety concerns at any time having active suicidal thoughts or homicidal potential to call 911 or go to local emergency room.  Follow up in 4 weeks.  Follow Up Instructions:    I discussed the assessment and treatment plan with the patient. The patient was provided an opportunity to ask questions and all were answered. The patient agreed with the plan and demonstrated an understanding of the instructions.   The patient was advised to call back or seek an in-person evaluation if the symptoms worsen or if the condition fails to improve as anticipated.  I provided 30 minutes of non-face-to-face time during this encounter.   Cleotis Nipper, MD

## 2020-10-20 ENCOUNTER — Ambulatory Visit (HOSPITAL_COMMUNITY): Payer: 59 | Admitting: Psychiatry

## 2020-10-31 ENCOUNTER — Other Ambulatory Visit: Payer: Self-pay | Admitting: Family Medicine

## 2020-10-31 ENCOUNTER — Other Ambulatory Visit: Payer: Self-pay

## 2020-10-31 ENCOUNTER — Encounter: Payer: Self-pay | Admitting: Family Medicine

## 2020-10-31 NOTE — Telephone Encounter (Signed)
Pt advised refill sent. °

## 2020-10-31 NOTE — Telephone Encounter (Signed)
Requesting: alprazolam Contract: 05/15/20 UDS:03/02/19 Last Visit:05/15/20 Next Visit:11/24/20 Last Refill:05/15/20(120,5)  Please Advise, pt requesting enough until appt. Medication pending

## 2020-11-01 MED FILL — ALPRAZolam 1 MG TABS: 1 | 30 days supply | Qty: 120 | Fill #0

## 2020-11-07 MED FILL — FEMYNOR 0.25-35 MG-MCG TABS: 0.25-35 | 84 days supply | Qty: 84 | Fill #0

## 2020-11-08 ENCOUNTER — Telehealth (INDEPENDENT_AMBULATORY_CARE_PROVIDER_SITE_OTHER): Payer: 59 | Admitting: Family Medicine

## 2020-11-08 ENCOUNTER — Encounter: Payer: Self-pay | Admitting: Family Medicine

## 2020-11-08 ENCOUNTER — Other Ambulatory Visit: Payer: Self-pay

## 2020-11-08 ENCOUNTER — Other Ambulatory Visit: Payer: Self-pay | Admitting: Family Medicine

## 2020-11-08 DIAGNOSIS — N3000 Acute cystitis without hematuria: Secondary | ICD-10-CM | POA: Diagnosis not present

## 2020-11-08 MED ORDER — FLUCONAZOLE 150 MG PO TABS: ORAL_TABLET | ORAL | 0 refills | Status: DC

## 2020-11-08 MED ORDER — SULFAMETHOXAZOLE-TRIMETHOPRIM 800-160 MG PO TABS: 1.0000 | ORAL_TABLET | Freq: Two times a day (BID) | ORAL | 0 refills | Status: DC

## 2020-11-08 MED FILL — FLUCONAZOLE 150 MG TABS: 150 | 2 days supply | Qty: 2 | Fill #0

## 2020-11-08 MED FILL — SULFAMETHOXAZOLE-TMP DS TAB: 800-160 | 3 days supply | Qty: 6 | Fill #0

## 2020-11-08 NOTE — Telephone Encounter (Signed)
Pls see if you can get her on as my 4:15 virtual for today for her urinary complaint.  Since she has had recent covid exposure and is awaiting testing it's ok to not come in to give urine sample this time.-thx

## 2020-11-08 NOTE — Telephone Encounter (Signed)
Spoke with patient and appointment scheduled for 415 slot with PCP. Medications,allergies, symptoms already discussed. Will send link at by text at the time of her appointment.

## 2020-11-08 NOTE — Progress Notes (Signed)
Virtual Visit via Video Note  I connected with pt on 11/08/20 at  4:00 PM EST by a video enabled telemedicine application and verified that I am speaking with the correct person using two identifiers.  Location patient: home, [place] Location provider:work or home office Persons participating in the virtual visit: patient, provider  I discussed the limitations of evaluation and management by telemedicine and the availability of in person appointments. The patient expressed understanding and agreed to proceed.   HPI: 45 y/o female being seen today for urinary complaints. (Recent exposure to covid so she is not able to come in to give urine specimen or be seen in-person for this problem).  Onset of suprapubic pressure yesterday, urinary urgency/frequency but not much urine comes out.  No vag d/c or itchings.  No fever. No burning with urination.  Took azo and it helped a little.   ROS: no n/v/abd pain, no diarrhea.  No flank pain or blood in urine.  Past Medical History:  Diagnosis Date  . Allergic rhinitis   . 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 Anxiety and depression    started with PPD  . Chronic neck pain    Myofascial pain syndrome per [facility], oxycodone per their office pain med contract.  (Normal cervical MRI 2010 per ortho notes).  . Dietary iron deficiency without anemia   . Easy bruisability 2011   Hx of: saw Hematologist (Dr. Cyndie Chime) and w/u neg for von Willebrand desease.  Marland Kitchen GERD (gastroesophageal reflux disease)    hx gastric ulcer and upper GI bleed, ? NSAID induced  . History of PCR DNA positive for HSV1   . History of PCR DNA positive for HSV2   . History of stomach ulcers   . Hypertension   . Iron deficiency 10/2019   WITHOUT anemia-suspect malabsorption. Pt to start FeSO4 325 qd 10/28/19  . Irregular menses 03/2018   GYN started [facility] 0.25-0.35.  [facility] as of 10/2018.  u/s wnl.  Pt declined endom bx.  . 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 Medial epicondylitis of elbow, left 12/2019   [facility]  . Migraine   . Osteoarthritis of carpometacarpal [facility]) joint of right thumb 12/2019   [facility]  . Reactive hypoglycemia    Dr. Everardo All  . Seizure (HCC) 2015   s/p eval with neuro, no medication, no events since  . Syncope    with ? seizure in 2015, eval with neruo  . Vitamin B12 deficiency    Starting monthly vit B12 injections 04/2019  . Vitamin D deficiency     Past Surgical History:  Procedure Laterality Date  . CESAREAN SECTION  2006  . ELBOW X-RAY Left 12/07/2019  . ENDOMETRIAL BIOPSY    . EYE SURGERY Bilateral    2001  . FINGER X-RAY Right 12/07/2019  . 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 TONSILLECTOMY AND ADENOIDECTOMY    . TUBAL LIGATION  2006  . tubes in ears both Bilateral      Current Outpatient Medications:  .  Alcohol Swabs (ALCOHOL WIPES) 70 % PADS, Use to clean finger before checking blood sugar, checks blood sugar 3-4 times daily, Disp: 100 each, Rfl: 11 .  ALPRAZolam (XANAX) 1 MG tablet, TAKE 2 TABLETS BY MOUTH IN THE MORNING & 2 TABLETS AT BEDTIME, Disp: 120 tablet, Rfl: 5 .  Calcium 250 MG CAPS, Take 1 capsule by mouth daily. , Disp: , Rfl:  .  cyanocobalamin (,VITAMIN B-12,)  1000 MCG/ML injection, 1 ml IM q 2 weeks, Disp: 10 mL, Rfl: 1 .  DULoxetine (CYMBALTA) 20 MG capsule, Take 1 capsule (20mg ) twice a day by mouth., Disp: 180 capsule, Rfl: 0 .  fluticasone (FLONASE) 50 MCG/ACT nasal spray, PLACE 2 SPRAYS INTO BOTH NOSTRILS DAILY., Disp: 16 g, Rfl: 6 .  glucose blood (FREESTYLE LITE) test strip, USE TO CHECK BLOOD SUGAR 3 TIMES A DAY, Disp: 200 strip, Rfl: 12 .  hydrochlorothiazide (HYDRODIURIL) 25 MG tablet, TAKE 1 TABLET BY MOUTH DAILY., Disp: 30 tablet, Rfl: 2 .  lamoTRIgine (LAMICTAL) 150 MG tablet, Take 1 tablet (150 mg total) by mouth daily., Disp: 30 tablet, Rfl: 0 .  metoprolol succinate (TOPROL-XL) 50 MG 24 hr tablet, TAKE 1 TABLET (50 MG TOTAL) BY MOUTH  DAILY. TAKE WITH OR IMMEDIATELY FOLLOWING A MEAL., Disp: 90 tablet, Rfl: 0 .  MONONESSA 0.25-35 MG-MCG tablet, Take 1 tablet by mouth daily., Disp: 84 tablet, Rfl: 3 .  montelukast (SINGULAIR) 10 MG tablet, Take 1 tablet (10 mg total) by mouth at bedtime., Disp: 30 tablet, Rfl: 5 .  omeprazole (PRILOSEC) 40 MG capsule, 1 cap po bid, Disp: 180 capsule, Rfl: 3 .  oxyCODONE-acetaminophen (PERCOCET) 10-325 MG tablet, TK 1 T PO QID PRN, Disp: , Rfl:  .  potassium chloride SA (K-DUR) 20 MEQ tablet, TAKE 1 TABLET BY MOUTH DAILY., Disp: 90 tablet, Rfl: 1 .  Vitamin D, Ergocalciferol, (DRISDOL) 1.25 MG (50000 UNIT) CAPS capsule, TAKE 1 CAPSULE BY MOUTH TWICE WEEKLY, Disp: 24 capsule, Rfl: 3 .  clindamycin (CLEOCIN) 2 % vaginal cream, clindamycin 2 % vaginal cream  INSERT 1 APPLICATORFUL VAGINALLY ONCE NIGHTLY FOR 7 NIGHTS (Patient not taking: No sig reported), Disp: , Rfl:  .  diclofenac Sodium (VOLTAREN) 1 % GEL, Apply 1 application topically 4 (four) times daily. (Patient not taking: No sig reported), Disp: , Rfl:  .  rizatriptan (MAXALT-MLT) 10 MG disintegrating tablet, 1 tab po qd as needed for migraine headache.  May repeat dose in 2 hours if needed.  Max dose in 24h is 20 mg. (Patient not taking: Reported on 11/08/2020), Disp: 10 tablet, Rfl: 5  EXAM:  VITALS per patient if applicable:  Vitals with BMI 05/15/2020 05/15/2020 05/15/2020  Height 5\' 6"  - 5\' 6"   Weight 220 lbs 7 oz - 257 lbs 6 oz  BMI 35.6 - 41.57  Systolic - 113 133  Diastolic - 90 84  Pulse - 73 83  Some encounter information is confidential and restricted. Go to Review Flowsheets activity to see all data.     GENERAL: alert, oriented, appears well and in no acute distress  HEENT: atraumatic, conjunttiva clear, no obvious abnormalities on inspection of external nose and ears  NECK: normal movements of the head and neck  LUNGS: on inspection no signs of respiratory distress, breathing rate appears normal, no obvious gross SOB,  gasping or wheezing  CV: no obvious cyanosis  MS: moves all visible extremities without noticeable abnormality  PSYCH/NEURO: pleasant and cooperative, no obvious depression or anxiety, speech and thought processing grossly intact  LABS: none today    Chemistry      Component Value Date/Time   NA 134 (L) 05/15/2020 2019   K 4.4 05/15/2020 2019   CL 103 05/15/2020 2019   CO2 17 (L) 05/15/2020 2019   BUN 19 05/15/2020 2019   CREATININE 1.74 (H) 05/15/2020 2019      Component Value Date/Time   CALCIUM 9.4 05/15/2020 2019   ALKPHOS 70  05/15/2020 2019   AST 14 (L) 05/15/2020 2019   ALT 13 05/15/2020 2019   BILITOT 0.5 05/15/2020 2019      ASSESSMENT AND PLAN:  Discussed the following assessment and plan:  Acute UTI. No urine sample obtained b/c pt with covid exposure and cannot come in to office to give sample. Empiric tx with bactrim ds, 1 bid x 3d. Pt with yeast vaginitis commonly after she gets abx so I sent in rx for diflucan 150mg  for her to take IF sx's of yeast vaginitis develop in the next week or so.  -we discussed possible serious and likely etiologies, options for evaluation and workup, limitations of telemedicine visit vs in person visit, treatment, treatment risks and precautions. Pt prefers to treat via telemedicine empirically rather than in person at this moment.    I discussed the assessment and treatment plan with the patient. The patient was provided an opportunity to ask questions and all were answered. The patient agreed with the plan and demonstrated an understanding of the instructions.   F/u: if not improving signif in 3d  Signed:  , MD           11/08/2020

## 2020-11-14 DIAGNOSIS — H52223 Regular astigmatism, bilateral: Secondary | ICD-10-CM | POA: Diagnosis not present

## 2020-11-14 DIAGNOSIS — H5213 Myopia, bilateral: Secondary | ICD-10-CM | POA: Diagnosis not present

## 2020-11-20 MED FILL — CYANOCOBALAMIN 1,000 MCG/ML: 1000 | 84 days supply | Qty: 6 | Fill #2

## 2020-11-23 ENCOUNTER — Telehealth (INDEPENDENT_AMBULATORY_CARE_PROVIDER_SITE_OTHER): Payer: 59 | Admitting: Psychiatry

## 2020-11-23 ENCOUNTER — Other Ambulatory Visit (HOSPITAL_COMMUNITY): Payer: Self-pay | Admitting: Psychiatry

## 2020-11-23 ENCOUNTER — Encounter (HOSPITAL_COMMUNITY): Payer: Self-pay | Admitting: Psychiatry

## 2020-11-23 ENCOUNTER — Other Ambulatory Visit: Payer: Self-pay

## 2020-11-23 DIAGNOSIS — F429 Obsessive-compulsive disorder, unspecified: Secondary | ICD-10-CM | POA: Diagnosis not present

## 2020-11-23 DIAGNOSIS — F419 Anxiety disorder, unspecified: Secondary | ICD-10-CM | POA: Diagnosis not present

## 2020-11-23 DIAGNOSIS — F331 Major depressive disorder, recurrent, moderate: Secondary | ICD-10-CM

## 2020-11-23 MED ORDER — DULOXETINE HCL 20 MG PO CPEP: ORAL_CAPSULE | ORAL | 0 refills | Status: DC

## 2020-11-23 MED ORDER — LAMOTRIGINE 150 MG PO TABS: 150.0000 mg | ORAL_TABLET | Freq: Every day | ORAL | 0 refills | Status: DC

## 2020-11-23 NOTE — Progress Notes (Signed)
Virtual Visit via Telephone Note  I connected with Toni [name] on 11/23/20 at  3:40 PM EST by telephone and verified that I am speaking with the correct person using two identifiers.  Location: Patient: home Provider: home office   I discussed the limitations, risks, security and privacy concerns of performing an evaluation and management service by telephone and the availability of in person appointments. I also discussed with the patient that there may be a patient responsible charge related to this service. The patient expressed understanding and agreed to proceed.   History of Present Illness: Patient is evaluated by phone session.  She is on the phone by herself.  On the last visit we increased Lamictal 150 mg and she noticed much improvement in her depression and mood.  She is taking Lamictal at nighttime because helping her sleep.  She is more motivated to do things.  She is more optimistic in her increased energy.  She is scheduled to see her PCP tomorrow and sometime she noticed her blood sugar fluctuates.  She had a blood work done today and hoping she will address this issue with her PCP tomorrow.  Her job is going well.  She had decided not to see Denyse Amass because she was feeling better but after some discussion she agreed to continue therapy as she still have some time ruminative thoughts about her aunt who died in Aug 29, 2023 due to chronic health condition.  Patient was involved in the decision to stop the oxygen and she feels sometimes guilty.  Her appetite is okay.  She has no tremors, shakes, rash.  She denies any suicidal thoughts.  Sometimes she has obsessive thoughts but it is not interfering in her daily activities.   Past Psychiatric History: H/Odepression after got married. Tried Zoloft for many yearsbut stopped afterseizure.PCP tried trazodone did not work. We tried higher dose of Cymbalta but developed side effects. H/Oirritability, poor impulse control with excessive  buying and shopping but no history of mania, psychosis, suicidal attempt or inpatient treatment  Psychiatric Specialty Exam: Physical Exam  Review of Systems  There were no vitals taken for this visit.There is no height or weight on file to calculate BMI.  General Appearance: NA  Eye Contact:  NA  Speech:  Normal Rate  Volume:  Normal  Mood:  Euthymic  Affect:  NA  Thought Process:  Goal Directed  Orientation:  Full (Time, Place, and Person)  Thought Content:  Rumination  Suicidal Thoughts:  No  Homicidal Thoughts:  No  Memory:  Immediate;   Good Recent;   Good Remote;   Good  Judgement:  Intact  Insight:  Present  Psychomotor Activity:  NA  Concentration:  Concentration: Good and Attention Span: Good  Recall:  Good  Fund of Knowledge:  Good  Language:  Good  Akathisia:  No  Handed:  Right  AIMS (if indicated):     Assets:  Communication Skills Desire for Improvement Housing Resilience Social Support Talents/Skills Transportation  ADL's:  Intact  Cognition:  WNL  Sleep:   improved      Assessment and Plan: Major depressive disorder, recurrent.  Anxiety.  OCD.  Patient doing better since she moved the Lamictal to take at bedtime and she is now taking increased dose which is 150 mg.  She has no rash or any itching.  Recommend to continue present dose of Lamictal, continue Cymbalta 20 mg twice a day.  She is also taking Xanax from her PCP.  We did talk about  restarting therapy with Denyse Amass and she will consider it.  I recommend to call us back if is any question or any concern.  Follow-up in 3 months.  Follow Up Instructions:    I discussed the assessment and treatment plan with the patient. The patient was provided an opportunity to ask questions and all were answered. The patient agreed with the plan and demonstrated an understanding of the instructions.   The patient was advised to call back or seek an in-person evaluation if the symptoms worsen or if the condition  fails to improve as anticipated.  I provided 14 minutes of non-face-to-face time during this encounter.   Cleotis Nipper, MD

## 2020-11-24 ENCOUNTER — Telehealth (HOSPITAL_COMMUNITY): Payer: 59 | Admitting: Psychiatry

## 2020-11-24 ENCOUNTER — Ambulatory Visit (INDEPENDENT_AMBULATORY_CARE_PROVIDER_SITE_OTHER): Payer: 59 | Admitting: Family Medicine

## 2020-11-24 ENCOUNTER — Other Ambulatory Visit: Payer: Self-pay | Admitting: Family Medicine

## 2020-11-24 ENCOUNTER — Other Ambulatory Visit: Payer: Self-pay

## 2020-11-24 ENCOUNTER — Encounter: Payer: Self-pay | Admitting: Family Medicine

## 2020-11-24 VITALS — BP 105/70 | HR 69 | Temp 97.9°F | Resp 16 | Ht 64.5 in | Wt 263.2 lb

## 2020-11-24 DIAGNOSIS — E559 Vitamin D deficiency, unspecified: Secondary | ICD-10-CM

## 2020-11-24 DIAGNOSIS — M5459 Other low back pain: Secondary | ICD-10-CM | POA: Diagnosis not present

## 2020-11-24 DIAGNOSIS — K9089 Other intestinal malabsorption: Secondary | ICD-10-CM | POA: Diagnosis not present

## 2020-11-24 DIAGNOSIS — E538 Deficiency of other specified B group vitamins: Secondary | ICD-10-CM

## 2020-11-24 DIAGNOSIS — K912 Postsurgical malabsorption, not elsewhere classified: Secondary | ICD-10-CM | POA: Diagnosis not present

## 2020-11-24 DIAGNOSIS — N3000 Acute cystitis without hematuria: Secondary | ICD-10-CM

## 2020-11-24 DIAGNOSIS — Z8639 Personal history of other endocrine, nutritional and metabolic disease: Secondary | ICD-10-CM

## 2020-11-24 DIAGNOSIS — Z79899 Other long term (current) drug therapy: Secondary | ICD-10-CM

## 2020-11-24 DIAGNOSIS — I10 Essential (primary) hypertension: Secondary | ICD-10-CM | POA: Diagnosis not present

## 2020-11-24 DIAGNOSIS — F411 Generalized anxiety disorder: Secondary | ICD-10-CM

## 2020-11-24 DIAGNOSIS — Z0000 Encounter for general adult medical examination without abnormal findings: Secondary | ICD-10-CM

## 2020-11-24 LAB — COMPREHENSIVE METABOLIC PANEL
ALT: 5 U/L (ref 0–35)
AST: 11 U/L (ref 0–37)
Albumin: 3.8 g/dL (ref 3.5–5.2)
Alkaline Phosphatase: 66 U/L (ref 39–117)
BUN: 24 mg/dL — ABNORMAL HIGH (ref 6–23)
CO2: 30 mEq/L (ref 19–32)
Calcium: 8.9 mg/dL (ref 8.4–10.5)
Chloride: 101 mEq/L (ref 96–112)
Creatinine, Ser: 1.14 mg/dL (ref 0.40–1.20)
GFR: 58.31 mL/min — ABNORMAL LOW (ref >60.00)
Glucose, Bld: 73 mg/dL (ref 70–99)
Potassium: 4 mEq/L (ref 3.5–5.1)
Sodium: 138 mEq/L (ref 135–145)
Total Bilirubin: 0.3 mg/dL (ref 0.2–1.2)
Total Protein: 6.5 g/dL (ref 6.0–8.3)

## 2020-11-24 LAB — LDL CHOLESTEROL, DIRECT: Direct LDL: 75 mg/dL

## 2020-11-24 LAB — CBC WITH DIFFERENTIAL/PLATELET
Basophils Absolute: 0 10*3/uL (ref 0.0–0.1)
Basophils Relative: 0.1 % (ref 0.0–3.0)
Eosinophils Absolute: 0.2 10*3/uL (ref 0.0–0.7)
Eosinophils Relative: 1.9 % (ref 0.0–5.0)
HCT: 37.3 % (ref 36.0–46.0)
Hemoglobin: 12 g/dL (ref 12.0–15.0)
Lymphocytes Relative: 38.8 % (ref 12.0–46.0)
Lymphs Abs: 3.1 10*3/uL (ref 0.7–4.0)
MCHC: 32.1 g/dL (ref 30.0–36.0)
MCV: 90.6 fl (ref 78.0–100.0)
Monocytes Absolute: 0.5 10*3/uL (ref 0.1–1.0)
Monocytes Relative: 5.8 % (ref 3.0–12.0)
Neutro Abs: 4.2 10*3/uL (ref 1.4–7.7)
Neutrophils Relative %: 53.4 % (ref 43.0–77.0)
Platelets: 305 10*3/uL (ref 150.0–400.0)
RBC: 4.12 Mil/uL (ref 3.87–5.11)
RDW: 15 % (ref 11.5–15.5)
WBC: 7.9 10*3/uL (ref 4.0–10.5)

## 2020-11-24 LAB — VITAMIN B12: Vitamin B-12: 766 pg/mL (ref 211–911)

## 2020-11-24 LAB — LIPID PANEL
Cholesterol: 223 mg/dL — ABNORMAL HIGH (ref 0–200)
HDL: 72.8 mg/dL (ref >39.00)
NonHDL: 149.86
Total CHOL/HDL Ratio: 3
Triglycerides: 329 mg/dL — ABNORMAL HIGH (ref 0.0–149.0)
VLDL: 65.8 mg/dL — ABNORMAL HIGH (ref 0.0–40.0)

## 2020-11-24 LAB — TSH: TSH: 2.15 u[IU]/mL (ref 0.35–4.50)

## 2020-11-24 LAB — VITAMIN D 25 HYDROXY (VIT D DEFICIENCY, FRACTURES): VITD: 41.07 ng/mL (ref 30.00–100.00)

## 2020-11-24 MED ORDER — CYANOCOBALAMIN 1000 MCG/ML IJ SOLN: INTRAMUSCULAR | 6 refills | Status: DC

## 2020-11-24 MED FILL — lamoTRIgine 150 MG TABS: 150 | 90 days supply | Qty: 90 | Fill #0

## 2020-11-24 MED FILL — HYDROCHLOROTHIAZIDE 25 MG T: 25 | 30 days supply | Qty: 30 | Fill #2

## 2020-11-24 MED FILL — DULoxetine HCL 20 MG CPEP: 20 | 90 days supply | Qty: 180 | Fill #0

## 2020-11-24 NOTE — Progress Notes (Signed)
Office Note 11/24/2020  CC:  Chief Complaint  Patient presents with  . Annual Exam    Pt is fasting    HPI:  Toni [name] is a 45 y.o. 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 Also hx of iron def anemia and vit B12 def secondary to this malabsorption.  Also hx of anxiety and depression, for which I rx her benzo.  She sees a psychiatrist, Dr. Lolly Mustache for mgmt of other psych meds. 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 More problematic gradually over the last 22yr, pt wanting to seek disability b/c of how much this is affecting her. Nutritionist referral to see anything diff can be done to avoid/diminish hypoglycemia. She already has had good habit of eating about q2h. Recheck insulin and C peptid levels as well as BMET today.  She declines 2nd endocrinologist opinion (saw Dr. Everardo All about 3 yrs ago) at this time. Regarding other malabs testing: monitor Vit B12 level and give b12 inj 1000 mcg today.  Monitoring iron and vit D today.  2) Chronic anxiety: will increase alpraz 1mg  to TWO qAM and she will continue TWO qPM. UDS today: should be present. Last took oxycdone Rebeca Allegra, ortho) couple days ago. CSC updated today. Xanax rx today.  3) HTN: The current medical regimen is effective;  continue present plan and medications. Lytes/cr today."  INTERIM HX: Feeling fine, looking forward to resting this w/e.  Recent bladder infxn, took 3d bactrim, has some bladder pressure still with urination but no burning or urgency.  Since having roux-en-Y bipass about 20 yrs ago: Still with some random periods of lightheaded, fatigued, tremulous, low sugar--related to eating most times.  Hx of this and has tried to adjust diet but is going to see a special  nutritionist soon as well.  HTN: no home monitoring but has a cuff to do so.  Vit b12 def: taking 1000 mcg IM q 2wks, most recent 4 d/a. Notes a bit more energy on these injections.  Vit D supp 50K U once weekly long term. Taking otc vit D as well but doesn't know dose.  Anxiety: Taking two of the 1mg  alpraz bid long term, other psych meds managed by psychiatrist. PMP AWARE reviewed today: most recent rx for alpraz 1mg  was filled 11/01/20, # 120, rx by me. No red flags.  Past Medical History:  Diagnosis Date  . Allergic rhinitis   . 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 Anxiety and depression    started with PPD  . Chronic neck pain    Myofascial pain syndrome per [facility], oxycodone per their office pain med contract.  (Normal cervical MRI 2010 per ortho notes).  . Dietary iron deficiency without anemia   . Easy bruisability 2011   Hx of: saw Hematologist (Dr. 11/03/20) and w/u neg for von Willebrand desease.  04/2016 GERD (gastroesophageal reflux disease)    hx gastric ulcer and upper GI bleed, ? NSAID induced  . History of PCR DNA positive for HSV1   . History of PCR DNA positive for HSV2   . History of stomach ulcers   . Hypertension   . Iron deficiency 10/2019   WITHOUT anemia-suspect malabsorption. Pt to start FeSO4 325 qd 10/28/19  . Irregular menses 03/2018   GYN started Femynor 0.25-0.35.  [facility] as of 10/2018.  u/s wnl.  Pt declined endom bx.  . 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 Medial epicondylitis of elbow, left 12/2019   [facility]  . Migraine   . Osteoarthritis of carpometacarpal [facility]) joint of right thumb 12/2019   [facility]  . Reactive hypoglycemia    Dr. Everardo All  . Seizure (HCC) 2015   s/p eval with neuro, no medication, no events since  . Syncope    with ? seizure in 2015, eval with neruo  . Vitamin B12 deficiency    Starting monthly vit B12 injections 04/2019  . Vitamin D deficiency     Past Surgical History:   Procedure Laterality Date  . CESAREAN SECTION  2006  . ELBOW X-RAY Left 12/07/2019  . ENDOMETRIAL BIOPSY    . EYE SURGERY Bilateral    2001  . FINGER X-RAY Right 12/07/2019  . 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 TONSILLECTOMY AND ADENOIDECTOMY    . TUBAL LIGATION  2006  . tubes in ears both Bilateral     Family History  Problem Relation Age of Onset  . Arthritis Mother   . Hyperlipidemia Mother   . Heart disease Mother   . Diabetes Mother   . Asthma Mother   . Aneurysm Father        deceased  . Lung cancer Maternal Aunt        smoker  . Colon cancer Maternal Grandmother   . Lung cancer Maternal Aunt        smoker    Social History   Socioeconomic History  . Marital status: Legally Separated    Spouse name: Not on file  . Number of children: Not on file  . Years of education: Not on file  . Highest education level: Not on file  Occupational History  . Not on file  Tobacco Use  . Smoking status: Never Smoker  . Smokeless tobacco: Never Used  Vaping Use  . Vaping Use: Never used  Substance and Sexual Activity  . Alcohol use: Yes    Alcohol/week: 0.0 standard drinks    Comment: occasional   . Drug use: No  . Sexual activity: Not on file  Other Topics Concern  . Not on file  Social History Narrative   Married.   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   No T/A/Ds.   Social Determinants of Health   Financial Resource Strain: Not on file  Food Insecurity: Not on file  Transportation Needs: Not on file  Physical Activity: Not on file  Stress: Not on file  Social Connections: Not on file  Intimate Partner Violence: Not on file    Outpatient Medications Prior to Visit  Medication Sig Dispense Refill  . ferrous sulfate 325 (65 FE) MG tablet Take 325 mg by mouth daily with breakfast.    . Alcohol Swabs (ALCOHOL WIPES) 70 % PADS Use to clean finger before checking blood sugar, checks blood sugar 3-4 times daily 100 each 11  . ALPRAZolam (XANAX) 1 MG  tablet TAKE 2 TABLETS BY MOUTH IN THE MORNING & 2 TABLETS AT BEDTIME 120 tablet 5  . Calcium 250 MG CAPS Take 1 capsule by mouth daily.     . cyanocobalamin (,VITAMIN B-12,) 1000 MCG/ML injection 1 ml IM q 2 weeks 10 mL 1  . diclofenac Sodium (VOLTAREN) 1 % GEL Apply 1 application topically 4 (four) times daily. (Patient not taking: No sig reported)    .  DULoxetine (CYMBALTA) 20 MG capsule Take 1 capsule ( ) twice a day by mouth. 180 capsule 0  . fluconazole (DIFLUCAN) 150 MG tablet 1 tab po qd x 2d 2 tablet 0  . fluticasone (FLONASE) 50 MCG/ACT nasal spray PLACE 2 SPRAYS INTO BOTH NOSTRILS DAILY. 16 g 6  . glucose blood (FREESTYLE LITE) test strip USE TO CHECK BLOOD SUGAR 3 TIMES A DAY 200 strip 12  . hydrochlorothiazide (HYDRODIURIL) 25 MG tablet TAKE 1 TABLET BY MOUTH DAILY. 30 tablet 2  . lamoTRIgine (LAMICTAL) 150 MG tablet Take 1 tablet (150 mg total) by mouth daily. 90 tablet 0  . metoprolol succinate (TOPROL-XL) 50 MG 24 hr tablet TAKE 1 TABLET (50 MG TOTAL) BY MOUTH DAILY. TAKE WITH OR IMMEDIATELY FOLLOWING A MEAL. 90 tablet 0  . MONONESSA 0.25-35 MG-MCG tablet Take 1 tablet by mouth daily. 84 tablet 3  . montelukast (SINGULAIR) 10 MG tablet Take 1 tablet (10 mg total) by mouth at bedtime. 30 tablet 5  . omeprazole (PRILOSEC) 40 MG capsule 1 cap po bid 180 capsule 3  . oxyCODONE-acetaminophen (PERCOCET) 10-325 MG tablet TK 1 T PO QID PRN    . potassium chloride SA (K-DUR) 20 MEQ tablet TAKE 1 TABLET BY MOUTH DAILY. 90 tablet 1  . rizatriptan (MAXALT-MLT) 10 MG disintegrating tablet 1 tab po qd as needed for migraine headache.  May repeat dose in 2 hours if needed.  Max dose in 24h is 20 mg. (Patient not taking: Reported on 11/08/2020) 10 tablet 5  . Vitamin D, Ergocalciferol, (DRISDOL) 1.25 MG (50000 UNIT) CAPS capsule TAKE 1 CAPSULE BY MOUTH TWICE WEEKLY 24 capsule 3  . clindamycin (CLEOCIN) 2 % vaginal cream clindamycin 2 % vaginal cream  INSERT 1 APPLICATORFUL VAGINALLY ONCE NIGHTLY  FOR 7 NIGHTS (Patient not taking: No sig reported)    . sulfamethoxazole-trimethoprim (BACTRIM DS) 800-160 MG tablet Take 1 tablet by mouth 2 (two) times daily. 6 tablet 0   No facility-administered medications prior to visit.    Allergies  Allergen Reactions  . Ibuprofen     Causes ulcers    ROS Review of Systems  Constitutional: Negative for appetite change, chills, fatigue and fever.  HENT: Negative for congestion, dental problem, ear pain and sore throat.   Eyes: Negative for discharge, redness and visual disturbance.  Respiratory: Negative for cough, chest tightness, shortness of breath and wheezing.   Cardiovascular: Negative for chest pain, palpitations and leg swelling.  Gastrointestinal: Negative for abdominal pain, blood in stool, diarrhea, nausea and vomiting.  Genitourinary: Negative for difficulty urinating, dysuria, flank pain, frequency, hematuria and urgency.  Musculoskeletal: Negative for arthralgias, back pain, joint swelling, myalgias and neck stiffness.  Skin: Negative for pallor and rash.  Neurological: Negative for dizziness, speech difficulty, weakness and headaches.  Hematological: Negative for adenopathy. Does not bruise/bleed easily.  Psychiatric/Behavioral: Negative for confusion and sleep disturbance. The patient is not nervous/anxious.     PE; Vitals with BMI 11/24/2020 05/15/2020 05/15/2020  Height 5' 4.5"  -  Weight 263 lbs 3 oz 220 lbs 7 oz -  BMI 44.5 35.6 -  Systolic 105 - 113  Diastolic 70 - 90  Pulse 69 - 73  Some encounter information is confidential and restricted. Go to Review Flowsheets activity to see all data.   Exam chaperoned by Harlene Salts, CMA   Gen: Alert, well appearing.  Patient is oriented to person, place, time, and situation. AFFECT: pleasant, lucid thought and speech. ENT: Ears: EACs clear, normal epithelium.  TMs with good light reflex and landmarks bilaterally.  Eyes: no injection, icteris, swelling, or exudate.   EOMI, PERRLA. Nose: no drainage or turbinate edema/swelling.  No injection or focal lesion.  Mouth: lips without lesion/swelling.  Oral mucosa pink and moist.  Dentition intact and without obvious caries or gingival swelling.  Oropharynx without erythema, exudate, or swelling.  Neck: supple/nontender.  No LAD, mass, or TM.  Carotid pulses 2+ bilaterally, without bruits. CV: RRR, no m/r/g.   LUNGS: CTA bilat, nonlabored resps, good aeration in all lung fields. ABD: soft, NT, ND, BS normal.  No hepatospenomegaly or mass.  No bruits. EXT: no clubbing, cyanosis, or edema.  Musculoskeletal: no joint swelling, erythema, warmth, or tenderness.  ROM of all joints intact. Skin - no sores or suspicious lesions or rashes or color changes   Pertinent labs:  Lab Results  Component Value Date   TSH 0.73 04/01/2019   Lab Results  Component Value Date   WBC 11.4 (H) 05/15/2020   HGB 14.3 05/15/2020   HCT 46.9 (H) 05/15/2020   MCV 96.7 05/15/2020   PLT 442 (H) 05/15/2020   Lab Results  Component Value Date   IRON 81 05/15/2020   TIBC 486 (H) 05/15/2020   FERRITIN 4 (L) 05/15/2020   Lab Results  Component Value Date   VITAMINB12 72 (L) 05/15/2020   Lab Results  Component Value Date   CREATININE 1.74 (H) 05/15/2020   BUN 19 05/15/2020   NA 134 (L) 05/15/2020   K 4.4 05/15/2020   CL 103 05/15/2020   CO2 17 (L) 05/15/2020   Lab Results  Component Value Date   ALT 13 05/15/2020   AST 14 (L) 05/15/2020   ALKPHOS 70 05/15/2020   BILITOT 0.5 05/15/2020   Lab Results  Component Value Date   CHOL 197 02/06/2018   Lab Results  Component Value Date   HDL 79.80 02/06/2018   Lab Results  Component Value Date   LDLCALC 89 02/06/2018   Lab Results  Component Value Date   TRIG 138.0 02/06/2018   Lab Results  Component Value Date   CHOLHDL 2 02/06/2018   Lab Results  Component Value Date   HGBA1C 4.6 08/07/2017   ASSESSMENT AND PLAN:   1) UTI: resolved.  2) Recurrent  symptomatic hypoglycemia, in setting of s/p roux-en-y bipass surgery. Stable.  To see nutritionist soon.  3) HTN: stable. Cont hctz 25mg  qd. Encouraged occasional home bp check. Lytes/cr today.  4) Hx of iron def, malabsorption secondary to gastric bipass surg: taking FeSO4 325mg  qd chronically.  Monitor iron and cbc today.  5) Vit D def: malabsorp. Cont 50K U q week and daily otc vit D tab. Vit D level check today.  6) Vit B12 def: malabsorp. Vit B12 level check today. Cont 1000 mcg IM q2 wks.  7) Anxiety, high risk med use (benzo). Stable, continue alpraz 1mg , 2 tabs bid. CSC UTD. UDS today.  8) Health maintenance exam: Reviewed age and gender appropriate health maintenance issues (prudent diet, regular exercise, health risks of tobacco and excessive alcohol, use of seatbelts, fire alarms in home, use of sunscreen).  Also reviewed age and gender appropriate health screening as well as vaccine recommendations. Vaccines: ALL UTD. Labs: fasting cbc, cmet, flp, tsh, vit D, vit b12, iron panel. Cervical ca screening: [facility]--next pap to be in 2023. [facility] (last mammogram in [place] is 03/20/17, normal)->pt states mammogram normal Oct or nov 2021. Colon ca  screening: average risk patient= as per latest guidelines, start screening at 45-50 yrs of age->pt declines at this time.   An After Visit Summary was printed and given to the patient.  FOLLOW UP:  Return in about 6 months (around 05/24/2021) for routine chronic illness f/u.  Signed:  Santiago BumpersPhil Tonna Palazzi, MD           11/24/2020

## 2020-11-24 NOTE — Patient Instructions (Signed)
Health Maintenance, Female Adopting a healthy lifestyle and getting preventive care are important in promoting health and wellness. Ask your health care provider about:  The right schedule for you to have regular tests and exams.  Things you can do on your own to prevent diseases and keep yourself healthy. What should I know about diet, weight, and exercise? Eat a healthy diet  Eat a diet that includes plenty of vegetables, fruits, low-fat dairy products, and lean protein.  Do not eat a lot of foods that are high in solid fats, added sugars, or sodium.   Maintain a healthy weight Body mass index (BMI) is used to identify weight problems. It estimates body fat based on height and weight. Your health care provider can help determine your BMI and help you achieve or maintain a healthy weight. Get regular exercise Get regular exercise. This is one of the most important things you can do for your health. Most adults should:  Exercise for at least 150 minutes each week. The exercise should increase your heart rate and make you sweat (moderate-intensity exercise).  Do strengthening exercises at least twice a week. This is in addition to the moderate-intensity exercise.  Spend less time sitting. Even light physical activity can be beneficial. Watch cholesterol and blood lipids Have your blood tested for lipids and cholesterol at 45 years of age, then have this test every 5 years. Have your cholesterol levels checked more often if:  Your lipid or cholesterol levels are high.  You are older than 45 years of age.  You are at high risk for heart disease. What should I know about cancer screening? Depending on your health history and family history, you may need to have cancer screening at various ages. This may include screening for:  Breast cancer.  Cervical cancer.  Colorectal cancer.  Skin cancer.  Lung cancer. What should I know about heart disease, diabetes, and high blood  pressure? Blood pressure and heart disease  High blood pressure causes heart disease and increases the risk of stroke. This is more likely to develop in people who have high blood pressure readings, are of African descent, or are overweight.  Have your blood pressure checked: ? Every 3-5 years if you are 18-39 years of age. ? Every year if you are 40 years old or older. Diabetes Have regular diabetes screenings. This checks your fasting blood sugar level. Have the screening done:  Once every three years after age 40 if you are at a normal weight and have a low risk for diabetes.  More often and at a younger age if you are overweight or have a high risk for diabetes. What should I know about preventing infection? Hepatitis B If you have a higher risk for hepatitis B, you should be screened for this virus. Talk with your health care provider to find out if you are at risk for hepatitis B infection. Hepatitis C Testing is recommended for:  Everyone born from 1945 through 1965.  Anyone with known risk factors for hepatitis C. Sexually transmitted infections (STIs)  Get screened for STIs, including gonorrhea and chlamydia, if: ? You are sexually active and are younger than 45 years of age. ? You are older than 45 years of age and your health care provider tells you that you are at risk for this type of infection. ? Your sexual activity has changed since you were last screened, and you are at increased risk for chlamydia or gonorrhea. Ask your health care provider   if you are at risk.  Ask your health care provider about whether you are at high risk for HIV. Your health care provider may recommend a prescription medicine to help prevent HIV infection. If you choose to take medicine to prevent HIV, you should first get tested for HIV. You should then be tested every 3 months for as long as you are taking the medicine. Pregnancy  If you are about to stop having your period (premenopausal) and  you may become pregnant, seek counseling before you get pregnant.  Take 400 to 800 micrograms (mcg) of folic acid every day if you become pregnant.  Ask for birth control (contraception) if you want to prevent pregnancy. Osteoporosis and menopause Osteoporosis is a disease in which the bones lose minerals and strength with aging. This can result in bone fractures. If you are 65 years old or older, or if you are at risk for osteoporosis and fractures, ask your health care provider if you should:  Be screened for bone loss.  Take a calcium or vitamin D supplement to lower your risk of fractures.  Be given hormone replacement therapy (HRT) to treat symptoms of menopause. Follow these instructions at home: Lifestyle  Do not use any products that contain nicotine or tobacco, such as cigarettes, e-cigarettes, and chewing tobacco. If you need help quitting, ask your health care provider.  Do not use street drugs.  Do not share needles.  Ask your health care provider for help if you need support or information about quitting drugs. Alcohol use  Do not drink alcohol if: ? Your health care provider tells you not to drink. ? You are pregnant, may be pregnant, or are planning to become pregnant.  If you drink alcohol: ? Limit how much you use to 0-1 drink a day. ? Limit intake if you are breastfeeding.  Be aware of how much alcohol is in your drink. In the [place] one drink equals one 12 oz bottle of beer (355 mL), one 5 oz glass of wine (148 mL), or one 1 oz glass of hard liquor (44 mL). General instructions  Schedule regular health, dental, and eye exams.  Stay current with your vaccines.  Tell your health care provider if: ? You often feel depressed. ? You have ever been abused or do not feel safe at home. Summary  Adopting a healthy lifestyle and getting preventive care are important in promoting health and wellness.  Follow your health care provider's instructions about healthy  diet, exercising, and getting tested or screened for diseases.  Follow your health care provider's instructions on monitoring your cholesterol and blood pressure. This information is not intended to replace advice given to you by your health care provider. Make sure you discuss any questions you have with your health care provider. Document Revised: 09/16/2018 Document Reviewed: 09/16/2018 Elsevier Patient Education  2021 [employer]  

## 2020-11-26 ENCOUNTER — Encounter: Payer: Self-pay | Admitting: Family Medicine

## 2020-11-26 LAB — DRUG MONITORING, PANEL 8 WITH CONFIRMATION, URINE
6 Acetylmorphine: NEGATIVE ng/mL (ref <10)
Alcohol Metabolites: NEGATIVE ng/mL
Alphahydroxyalprazolam: 127 ng/mL — ABNORMAL HIGH (ref <25)
Alphahydroxymidazolam: NEGATIVE ng/mL (ref <50)
Alphahydroxytriazolam: NEGATIVE ng/mL (ref <50)
Aminoclonazepam: NEGATIVE ng/mL (ref <25)
Amphetamines: NEGATIVE ng/mL (ref <500)
Benzodiazepines: POSITIVE ng/mL — AB (ref <100)
Buprenorphine, Urine: NEGATIVE ng/mL (ref <5)
Cocaine Metabolite: NEGATIVE ng/mL (ref <150)
Codeine: NEGATIVE ng/mL (ref <50)
Creatinine: 185.2 mg/dL
Hydrocodone: NEGATIVE ng/mL (ref <50)
Hydromorphone: NEGATIVE ng/mL (ref <50)
Hydroxyethylflurazepam: NEGATIVE ng/mL (ref <50)
Lorazepam: NEGATIVE ng/mL (ref <50)
MDMA: NEGATIVE ng/mL (ref <500)
Marijuana Metabolite: NEGATIVE ng/mL (ref <20)
Morphine: NEGATIVE ng/mL (ref <50)
Nordiazepam: 602 ng/mL — ABNORMAL HIGH (ref <50)
Norhydrocodone: NEGATIVE ng/mL (ref <50)
Noroxycodone: >10000 ng/mL — ABNORMAL HIGH (ref <50)
Opiates: NEGATIVE ng/mL (ref <100)
Oxazepam: 937 ng/mL — ABNORMAL HIGH (ref <50)
Oxidant: NEGATIVE ug/mL
Oxycodone: >10000 ng/mL — ABNORMAL HIGH (ref <50)
Oxycodone: POSITIVE ng/mL — AB (ref <100)
Oxymorphone: 4060 ng/mL — ABNORMAL HIGH (ref <50)
Temazepam: 1637 ng/mL — ABNORMAL HIGH (ref <50)
pH: 5.5 (ref 4.5–9.0)

## 2020-11-26 LAB — IRON,TIBC AND FERRITIN PANEL
%SAT: 17 % (calc) (ref 16–45)
Ferritin: 11 ng/mL — ABNORMAL LOW (ref 16–232)
Iron: 74 ug/dL (ref 40–190)
TIBC: 445 mcg/dL (calc) (ref 250–450)

## 2020-11-26 LAB — DM TEMPLATE

## 2020-11-27 ENCOUNTER — Other Ambulatory Visit (HOSPITAL_COMMUNITY): Payer: Self-pay | Admitting: Psychiatry

## 2020-11-27 ENCOUNTER — Other Ambulatory Visit (HOSPITAL_COMMUNITY): Payer: Self-pay | Admitting: Obstetrics and Gynecology

## 2020-11-27 DIAGNOSIS — Z113 Encounter for screening for infections with a predominantly sexual mode of transmission: Secondary | ICD-10-CM | POA: Diagnosis not present

## 2020-11-27 DIAGNOSIS — F331 Major depressive disorder, recurrent, moderate: Secondary | ICD-10-CM

## 2020-11-27 DIAGNOSIS — R399 Unspecified symptoms and signs involving the genitourinary system: Secondary | ICD-10-CM | POA: Diagnosis not present

## 2020-11-27 MED FILL — NITROFURANTOIN MONO-MCR 100: 100 | 5 days supply | Qty: 10 | Fill #0

## 2020-11-27 MED FILL — VIT D2 1.25 MG (50,000 UNIT: 1.25 MG | 84 days supply | Qty: 24 | Fill #1

## 2020-11-27 MED FILL — FLUCONAZOLE 150 MG TABS: 150 | 4 days supply | Qty: 2 | Fill #0

## 2020-11-30 MED FILL — ALPRAZolam 1 MG TABS: 1 | 30 days supply | Qty: 120 | Fill #1

## 2020-12-06 ENCOUNTER — Encounter: Payer: 59 | Attending: Family Medicine | Admitting: Skilled Nursing Facility1

## 2020-12-06 ENCOUNTER — Other Ambulatory Visit (HOSPITAL_COMMUNITY): Payer: Self-pay | Admitting: Chiropractic Medicine

## 2020-12-08 MED FILL — OMEPRAZOLE 40 MG CPDR: 40 | 90 days supply | Qty: 180 | Fill #2

## 2020-12-14 ENCOUNTER — Ambulatory Visit: Payer: 59 | Admitting: Family Medicine

## 2020-12-14 NOTE — Progress Notes (Deleted)
OFFICE VISIT  12/14/2020  CC: No chief complaint on file.   HPI:    Patient is a 45 y.o. African-American female who presents for 3 wk f/u kidney function. I saw her 3 wks ago for RCI and CPE and all was stable except renal function down some. My result note attached to her labs from last visit was: "Iron storage remains low. She'll need to continue a daily iron tab. Vitamin D and B12 are good: continue supplements for both. Kidney function down a little compared to her baseline. I recommend she stop her hctz, try to monitor bp a few times a week and write numbers down, and bring them in with her to review with me in 2-3 wks and I'll recheck kidney function at that time as well. Triglycerides moderately elevated but no medication for this is indicated at this time."  INTERIM HX: ***    Past Medical History:  Diagnosis Date  . Allergic rhinitis   . 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 Anxiety and depression    started with PPD  . Chronic neck pain    Myofascial pain syndrome per [place] [facility], oxycodone per their office pain med contract.  (Normal cervical MRI 2010 per ortho notes).  . Dietary iron deficiency without anemia   . Easy bruisability 2011   Hx of: saw Hematologist (Dr. Cyndie Chime) and w/u neg for von Willebrand desease.  Marland Kitchen GERD (gastroesophageal reflux disease)    hx gastric ulcer and upper GI bleed, ? NSAID induced  . History of PCR DNA positive for HSV1   . History of PCR DNA positive for HSV2   . History of stomach ulcers   . Hypertension   . Iron deficiency 10/2019   WITHOUT anemia-suspect malabsorption. Pt to start FeSO4 325 qd 10/28/19  . Irregular menses 03/2018   GYN started Femynor 0.25-0.35.  [facility] as of 10/2018.  u/s wnl.  Endom bx NEG. OCPs helped.  . 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 Medial epicondylitis of elbow, left 12/2019   [facility]  . Migraine   . Osteoarthritis of carpometacarpal [facility]) joint of right  thumb 12/2019   [facility]  . Reactive hypoglycemia    Dr. Everardo All  . Seizure (HCC) 2015   s/p eval with neuro, no medication, no events since  . Syncope    with ? seizure in 2015, eval with neruo  . Vitamin B12 deficiency   . Vitamin D deficiency     Past Surgical History:  Procedure Laterality Date  . CESAREAN SECTION  2006  . ELBOW X-RAY Left 12/07/2019  . ENDOMETRIAL BIOPSY    . EYE SURGERY Bilateral    2001  . FINGER X-RAY Right 12/07/2019  . 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 TONSILLECTOMY AND ADENOIDECTOMY    . TUBAL LIGATION  2006  . tubes in ears both Bilateral     Outpatient Medications Prior to Visit  Medication Sig Dispense Refill  . Alcohol Swabs (ALCOHOL WIPES) 70 % PADS Use to clean finger before checking blood sugar, checks blood sugar 3-4 times daily 100 each 11  . ALPRAZolam (XANAX) 1 MG tablet TAKE 2 TABLETS BY MOUTH IN THE MORNING & 2 TABLETS AT BEDTIME 120 tablet 5  . Calcium 250 MG CAPS Take 1 capsule by mouth daily.     . cyanocobalamin (,VITAMIN B-12,) 1000 MCG/ML injection 1 ml IM q 2 weeks 10 mL 6  . diclofenac Sodium (VOLTAREN) 1 % GEL  Apply 1 application topically 4 (four) times daily. (Patient not taking: No sig reported)    . DULoxetine (CYMBALTA) 20 MG capsule Take 1 capsule (20mg ) twice a day by mouth. 180 capsule 0  . ferrous sulfate 325 (65 FE) MG tablet Take 325 mg by mouth daily with breakfast.    . fluconazole (DIFLUCAN) 150 MG tablet 1 tab po qd x 2d 2 tablet 0  . fluticasone (FLONASE) 50 MCG/ACT nasal spray PLACE 2 SPRAYS INTO BOTH NOSTRILS DAILY. 16 g 6  . glucose blood (FREESTYLE LITE) test strip USE TO CHECK BLOOD SUGAR 3 TIMES A DAY 200 strip 12  . hydrochlorothiazide (HYDRODIURIL) 25 MG tablet TAKE 1 TABLET BY MOUTH DAILY. 30 tablet 2  . lamoTRIgine (LAMICTAL) 150 MG tablet Take 1 tablet (150 mg total) by mouth daily. 90 tablet 0  . metoprolol succinate (TOPROL-XL) 50 MG 24 hr tablet TAKE 1 TABLET (50 MG TOTAL) BY MOUTH DAILY.  TAKE WITH OR IMMEDIATELY FOLLOWING A MEAL. 90 tablet 0  . MONONESSA 0.25-35 MG-MCG tablet Take 1 tablet by mouth daily. 84 tablet 3  . montelukast (SINGULAIR) 10 MG tablet Take 1 tablet (10 mg total) by mouth at bedtime. 30 tablet 5  . omeprazole (PRILOSEC) 40 MG capsule 1 cap po bid 180 capsule 3  . oxyCODONE-acetaminophen (PERCOCET) 10-325 MG tablet TK 1 T PO QID PRN    . potassium chloride SA (K-DUR) 20 MEQ tablet TAKE 1 TABLET BY MOUTH DAILY. 90 tablet 1  . rizatriptan (MAXALT-MLT) 10 MG disintegrating tablet 1 tab po qd as needed for migraine headache.  May repeat dose in 2 hours if needed.  Max dose in 24h is 20 mg. (Patient not taking: Reported on 11/08/2020) 10 tablet 5  . Vitamin D, Ergocalciferol, (DRISDOL) 1.25 MG (50000 UNIT) CAPS capsule TAKE 1 CAPSULE BY MOUTH TWICE WEEKLY 24 capsule 3   No facility-administered medications prior to visit.    Allergies  Allergen Reactions  . Ibuprofen     Causes ulcers    ROS As per HPI  PE: Vitals with BMI 11/24/2020 05/15/2020 05/15/2020  Height 5' 4.5" 5\' 6"  -  Weight 263 lbs 3 oz 220 lbs 7 oz -  BMI 44.5 35.6 -  Systolic 105 - 113  Diastolic 70 - 90  Pulse 69 - 73  Some encounter information is confidential and restricted. Go to Review Flowsheets activity to see all data.     ***  LABS:    Chemistry      Component Value Date/Time   NA 138 11/24/2020 0927   K 4.0 11/24/2020 0927   CL 101 11/24/2020 0927   CO2 30 11/24/2020 0927   BUN 24 (H) 11/24/2020 0927   CREATININE 1.14 11/24/2020 0927      Component Value Date/Time   CALCIUM 8.9 11/24/2020 0927   ALKPHOS 66 11/24/2020 0927   AST 11 11/24/2020 0927   ALT 5 11/24/2020 0927   BILITOT 0.3 11/24/2020 0927     Lab Results  Component Value Date   WBC 7.9 11/24/2020   HGB 12.0 11/24/2020   HCT 37.3 11/24/2020   MCV 90.6 11/24/2020   PLT 305.0 11/24/2020   IMPRESSION AND PLAN:  No problem-specific Assessment & Plan notes found for this encounter.   Trial of  amlod or felod (the only past med for htn documented for her are hctz and irb/hctz and metop) ? ua  ? Renal u/s and/or renal artery dopplers  An After Visit Summary was printed and  given to the patient.  FOLLOW UP: No follow-ups on file.  Signed:  Santiago Bumpers, MD           12/14/2020

## 2020-12-27 ENCOUNTER — Other Ambulatory Visit: Payer: Self-pay

## 2020-12-28 ENCOUNTER — Ambulatory Visit: Payer: 59 | Admitting: Family Medicine

## 2020-12-28 DIAGNOSIS — R5381 Other malaise: Secondary | ICD-10-CM | POA: Diagnosis not present

## 2020-12-28 DIAGNOSIS — R531 Weakness: Secondary | ICD-10-CM | POA: Diagnosis not present

## 2020-12-28 DIAGNOSIS — Z0289 Encounter for other administrative examinations: Secondary | ICD-10-CM

## 2020-12-28 MED FILL — ALPRAZolam 1 MG TABS: 1 | 30 days supply | Qty: 120 | Fill #2

## 2020-12-28 MED FILL — FLUCONAZOLE 150 MG TABS: 150 | 4 days supply | Qty: 2 | Fill #1
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
## 2021-01-01 ENCOUNTER — Telehealth: Payer: Self-pay

## 2021-01-01 NOTE — Telephone Encounter (Signed)
Appt rescheduled for 3/31, she is due for provider follow up and if labs needed will be collected.  [facility] Day - Client Nonclinical Telephone Record  [facility] Client [facility] Day - Client Client Site [facility] - Day Physician Santiago Bumpers - MD Contact Type Call Who Is Calling Patient / Member / Family / Caregiver Caller Name Cecila Satcher Caller Phone Number [phone] Patient Name Toni [name] Patient DOB Aug 07, 1976 Call Type Message Only Information Provided Reason for Call Request to Schedule Office Appointment Initial Comment Caller states she was seen yesterday and she would like to get some blood work done today. Disp. Time Disposition Final User 12/29/2020 7:42:51 AM General Information Provided Yes Marlow Baars, Tyrechia Call Closed By: Ellis Parents Transaction Date/Time: 12/29/2020 7:40:54 AM (ET)

## 2021-01-03 ENCOUNTER — Other Ambulatory Visit: Payer: Self-pay

## 2021-01-04 ENCOUNTER — Ambulatory Visit: Payer: 59 | Admitting: Family Medicine

## 2021-01-04 ENCOUNTER — Encounter: Payer: Self-pay | Admitting: Family Medicine

## 2021-01-04 ENCOUNTER — Other Ambulatory Visit: Payer: Self-pay | Admitting: Family Medicine

## 2021-01-04 ENCOUNTER — Other Ambulatory Visit: Payer: Self-pay

## 2021-01-04 VITALS — BP 126/85 | HR 80 | Temp 98.1°F | Resp 16 | Ht 64.5 in | Wt 258.2 lb

## 2021-01-04 DIAGNOSIS — B9689 Other specified bacterial agents as the cause of diseases classified elsewhere: Secondary | ICD-10-CM

## 2021-01-04 DIAGNOSIS — R3129 Other microscopic hematuria: Secondary | ICD-10-CM | POA: Diagnosis not present

## 2021-01-04 DIAGNOSIS — I10 Essential (primary) hypertension: Secondary | ICD-10-CM

## 2021-01-04 DIAGNOSIS — N760 Acute vaginitis: Secondary | ICD-10-CM

## 2021-01-04 DIAGNOSIS — E611 Iron deficiency: Secondary | ICD-10-CM | POA: Diagnosis not present

## 2021-01-04 DIAGNOSIS — R7989 Other specified abnormal findings of blood chemistry: Secondary | ICD-10-CM

## 2021-01-04 LAB — POCT URINALYSIS DIPSTICK
Bilirubin, UA: NEGATIVE
Glucose, UA: NEGATIVE
Leukocytes, UA: NEGATIVE
Nitrite, UA: NEGATIVE
Protein, UA: POSITIVE — AB
Spec Grav, UA: 1.02 (ref 1.010–1.025)
Urobilinogen, UA: 0.2 E.U./dL
pH, UA: 6 (ref 5.0–8.0)

## 2021-01-04 MED ORDER — METOPROLOL SUCCINATE ER 100 MG PO TB24: 100.0000 mg | ORAL_TABLET | Freq: Every day | ORAL | 1 refills | Status: DC

## 2021-01-04 MED ORDER — FERROUS GLUCONATE 324 (38 FE) MG PO TABS
324.0000 mg | ORAL_TABLET | Freq: Every day | ORAL | 3 refills | Status: DC
Start: 2021-01-04 — End: 2021-01-04

## 2021-01-04 MED ORDER — METRONIDAZOLE 500 MG PO TABS: 500.0000 mg | ORAL_TABLET | Freq: Two times a day (BID) | ORAL | 0 refills | Status: DC

## 2021-01-04 MED FILL — metroNIDAZOLE 500 MG TABS: 500 | 7 days supply | Qty: 14 | Fill #0
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
## 2021-01-05 LAB — URINALYSIS, ROUTINE W REFLEX MICROSCOPIC
Bilirubin Urine: NEGATIVE
Ketones, ur: 15 — AB
Leukocytes,Ua: NEGATIVE
Nitrite: NEGATIVE
Specific Gravity, Urine: 1.02 (ref 1.000–1.030)
Total Protein, Urine: NEGATIVE
Urine Glucose: NEGATIVE
Urobilinogen, UA: 0.2 (ref 0.0–1.0)
pH: 6 (ref 5.0–8.0)

## 2021-01-05 LAB — BASIC METABOLIC PANEL
BUN: 6 mg/dL (ref 6–23)
CO2: 25 mEq/L (ref 19–32)
Calcium: 8.8 mg/dL (ref 8.4–10.5)
Chloride: 104 mEq/L (ref 96–112)
Creatinine, Ser: 0.7 mg/dL (ref 0.40–1.20)
GFR: 104.6 mL/min (ref >60.00)
Glucose, Bld: 94 mg/dL (ref 70–99)
Potassium: 4.4 mEq/L (ref 3.5–5.1)
Sodium: 138 mEq/L (ref 135–145)

## 2021-01-08 ENCOUNTER — Encounter: Payer: Self-pay | Admitting: Family Medicine

## 2021-01-08 NOTE — Telephone Encounter (Signed)
Ketones and blood on her urinalysis were from contamination from vaginal tissues.  Nothing to worry about.

## 2021-01-08 NOTE — Telephone Encounter (Signed)
Pt has additional concern regarding urinalysis.  Please advise, thanks.

## 2021-01-10 ENCOUNTER — Encounter: Payer: Self-pay | Admitting: Family Medicine

## 2021-01-11 ENCOUNTER — Other Ambulatory Visit (HOSPITAL_COMMUNITY): Payer: Self-pay

## 2021-01-17 ENCOUNTER — Other Ambulatory Visit (HOSPITAL_COMMUNITY): Payer: Self-pay

## 2021-01-17 ENCOUNTER — Encounter: Payer: Self-pay | Admitting: Family Medicine

## 2021-01-17 MED ORDER — QUETIAPINE FUMARATE 100 MG PO TABS
100.0000 mg | ORAL_TABLET | Freq: Every evening | ORAL | 1 refills | Status: DC | PRN
Start: 2021-01-17 — End: 2021-04-27
  Filled 2021-01-17: qty 30, 30d supply, fill #0
  Filled 2021-02-19: qty 30, 30d supply, fill #1

## 2021-01-17 NOTE — Telephone Encounter (Signed)
OK, I sent in 100mg  seroquel to take at bedtime as needed for sleep.

## 2021-01-17 NOTE — Telephone Encounter (Signed)
Please Advise

## 2021-01-18 ENCOUNTER — Other Ambulatory Visit (HOSPITAL_COMMUNITY): Payer: Self-pay

## 2021-01-18 MED FILL — Gabapentin Cap 100 MG: ORAL | 30 days supply | Qty: 90 | Fill #0 | Status: AC

## 2021-01-26 ENCOUNTER — Other Ambulatory Visit (HOSPITAL_COMMUNITY): Payer: Self-pay

## 2021-01-26 MED FILL — Alprazolam Tab 1 MG: ORAL | 30 days supply | Qty: 120 | Fill #0 | Status: AC

## 2021-01-30 ENCOUNTER — Encounter: Payer: Self-pay | Admitting: Family Medicine

## 2021-01-30 DIAGNOSIS — Z9884 Bariatric surgery status: Secondary | ICD-10-CM

## 2021-01-30 DIAGNOSIS — K9089 Other intestinal malabsorption: Secondary | ICD-10-CM

## 2021-01-30 NOTE — Telephone Encounter (Signed)
Pls order this referral, dx is history of roux-en-Y gastric bipass surgery.-thx

## 2021-01-31 ENCOUNTER — Other Ambulatory Visit: Payer: Self-pay

## 2021-01-31 NOTE — Telephone Encounter (Signed)
Unable to enter referral with dx provided.

## 2021-01-31 NOTE — Telephone Encounter (Signed)
OK. I entered the order.

## 2021-02-01 ENCOUNTER — Other Ambulatory Visit (HOSPITAL_COMMUNITY): Payer: Self-pay

## 2021-02-01 MED FILL — Norgestimate & Ethinyl Estradiol Tab 0.25 MG-35 MCG: ORAL | 84 days supply | Qty: 84 | Fill #0 | Status: AC

## 2021-02-02 ENCOUNTER — Encounter: Payer: Self-pay | Admitting: Family Medicine

## 2021-02-02 ENCOUNTER — Ambulatory Visit: Payer: 59 | Admitting: Family Medicine

## 2021-02-02 ENCOUNTER — Ambulatory Visit (INDEPENDENT_AMBULATORY_CARE_PROVIDER_SITE_OTHER): Payer: 59 | Admitting: Family Medicine

## 2021-02-02 ENCOUNTER — Other Ambulatory Visit: Payer: Self-pay

## 2021-02-02 VITALS — BP 129/86 | HR 76 | Temp 98.0°F | Resp 18 | Wt 275.8 lb

## 2021-02-02 DIAGNOSIS — I10 Essential (primary) hypertension: Secondary | ICD-10-CM | POA: Diagnosis not present

## 2021-02-02 NOTE — Progress Notes (Signed)
OFFICE VISIT  02/02/2021  CC:  Chief Complaint  Patient presents with  . Hypertension    4 week f/u: Increased Metoprolol 50 mg to 100 mg at last OV.  -tlc,rma Concerns: Pt says her blood sugars have been low.    HPI:    Patient is a 45 y.o. female who presents for 4 wk f/u HTN. A/P as of last visit: "1) Elevated sCr: UA today is c/w contaminant by menstrual blood. Recheck bmet today. She avoids NSAIDs. Suspect she may have intermittent periods of dehydration + hctz causing some prerenal azotemia. Hydrating well lately and OFF HCTZ should see today's sCr back in 0.8 range.  2) Vaginal irritation: suspect BV. Flagyl 500mg  bid x7d rx'd today. This has been treated as yeast already a couple of times. If flagyl does not help then I'll have to recommend she go to her gyn MD for vag exam/wet prep.  3) HTN: bp not well controlled since d/c of hctz. Inc toprol xl from 50mg  to 100 mg qd. Cont home bp and hr monitoring and we'll review again in 1 mo.  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 Intol of FeSO4, so I'll try ferrous gluconate 324mg  qd."  INTERIM HX: Feeling fine, no adverse effects from toprol 100mg  dose. Has only checked bp once since last visit and it was 136/92, HR 77. She suspects her bp cuff is not working--getting "error" message today at home. Has large adult cuff for upper arm, says machine is quite old.    Past Medical History:  Diagnosis Date  . Allergic rhinitis   . 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 Anxiety and depression    started with PPD  . Chronic neck pain    Myofascial pain syndrome per [facility], oxycodone per their office pain med contract.  (Normal cervical MRI 2010 per ortho notes).  . Dietary iron deficiency without anemia   . Easy bruisability 2011   Hx of: saw Hematologist (Dr. ) and w/u neg for von Willebrand desease.  GERD (gastroesophageal reflux disease)    hx  gastric ulcer and upper GI bleed, ? NSAID induced  . History of PCR DNA positive for HSV1   . History of PCR DNA positive for HSV2   . History of stomach ulcers   . Hypertension   . Iron deficiency 10/2019   WITHOUT anemia-suspect malabsorption. Pt to start FeSO4 325 qd 10/28/19  . Irregular menses 03/2018   GYN started Femynor 0.25-0.35.  [facility] as of 10/2018.  u/s wnl.  Endom bx NEG. OCPs helped.  . 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 Medial epicondylitis of elbow, left 12/2019   [facility]  . Migraine   . Osteoarthritis of carpometacarpal [facility]) joint of right thumb 12/2019   [facility]  . Reactive hypoglycemia    Dr. 11/2018  . Seizure (HCC) 2015   s/p eval with neuro, no medication, no events since  . Syncope    with ? seizure in 2015, eval with neruo  . Vitamin B12 deficiency   . Vitamin D deficiency     Past Surgical History:  Procedure Laterality Date  . CESAREAN SECTION  2006  . ELBOW X-RAY Left 12/07/2019  . ENDOMETRIAL BIOPSY    . EYE SURGERY Bilateral    2001  . FINGER X-RAY Right 12/07/2019  . 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 TONSILLECTOMY AND ADENOIDECTOMY    . TUBAL LIGATION  2006  .  tubes in ears both Bilateral     Outpatient Medications Prior to Visit  Medication Sig Dispense Refill  . Alcohol Swabs (ALCOHOL WIPES) 70 % PADS Use to clean finger before checking blood sugar, checks blood sugar 3-4 times daily 100 each 11  . ALPRAZolam (XANAX) 1 MG tablet TAKE 2 TABLETS BY MOUTH IN THE MORNING & 2 TABLETS AT BEDTIME 120 tablet 5  . Calcium 250 MG CAPS Take 1 capsule by mouth daily.     . cyanocobalamin (,VITAMIN B-12,) 1000 MCG/ML injection INJECT 1 ML INTO THE MUSCLE EVERY 2 WEEKS 10 mL 6  . diclofenac Sodium (VOLTAREN) 1 % GEL Apply 1 application topically 4 (four) times daily.    . DULoxetine (CYMBALTA) 20 MG capsule TAKE 1 CAPSULE (20MG ) TWICE A DAY BY MOUTH. 180 capsule 0  . FEMYNOR 0.25-35 MG-MCG tablet TAKE 1 TABLET  BY MOUTH ONCE DAILY. 84 tablet 3  . ferrous gluconate (FERGON) 324 MG tablet TAKE 1 TABLET (324 MG TOTAL) BY MOUTH DAILY WITH BREAKFAST. 90 tablet 3  . fluticasone (FLONASE) 50 MCG/ACT nasal spray PLACE 2 SPRAYS INTO BOTH NOSTRILS DAILY. 16 g 6  . gabapentin (NEURONTIN) 100 MG capsule TAKE 1 CAPSULE BY MOUTH THREE TIMES DAILY. 90 capsule 2  . gabapentin (NEURONTIN) 100 MG capsule TAKE 1 CAPSULE BY MOUTH THREE TIMES DAILY. 90 capsule 0  . glucose blood (FREESTYLE LITE) test strip USE TO CHECK BLOOD SUGAR 3 TIMES A DAY 200 strip 12  . lamoTRIgine (LAMICTAL) 150 MG tablet TAKE 1 TABLET (150 MG TOTAL) BY MOUTH DAILY. 90 tablet 0  . methocarbamol (ROBAXIN) 500 MG tablet     . metoprolol succinate (TOPROL-XL) 100 MG 24 hr tablet TAKE 1 TABLET (100 MG TOTAL) BY MOUTH DAILY. TAKE WITH OR IMMEDIATELY FOLLOWING A MEAL. (Patient taking differently: Take by mouth daily. Take with or immediately following a meal.) 30 tablet 1  . montelukast (SINGULAIR) 10 MG tablet Take 1 tablet (10 mg total) by mouth at bedtime. 30 tablet 5  . norgestimate-ethinyl estradiol (ORTHO-CYCLEN) 0.25-35 MG-MCG tablet TAKE 1 TABLET BY MOUTH ONCE DAILY. 84 tablet 4  . omeprazole (PRILOSEC) 40 MG capsule TAKE 1 CAPSULE BY MOUTH TWO TIMES DAILY 180 capsule 3  . oxyCODONE-acetaminophen (PERCOCET) 10-325 MG tablet TK 1 T PO QID PRN    . potassium chloride SA (K-DUR) 20 MEQ tablet TAKE 1 TABLET BY MOUTH DAILY. 90 tablet 1  . QUEtiapine (SEROQUEL) 100 MG tablet Take 1 tablet (100 mg total) by mouth at bedtime as needed for insomnia 30 tablet 1  . rizatriptan (MAXALT-MLT) 10 MG disintegrating tablet 1 tab po qd as needed for migraine headache.  May repeat dose in 2 hours if needed.  Max dose in 24h is 20 mg. (Patient taking differently: 1 tab po qd as needed for migraine headache.  May repeat dose in 2 hours if needed.  Max dose in 24h is 20 mg.) 10 tablet 5  . Vitamin D, Ergocalciferol, (DRISDOL) 1.25 MG (50000 UNIT) CAPS capsule TAKE 1  CAPSULE BY MOUTH TWICE WEEKLY 24 capsule 3  . fluconazole (DIFLUCAN) 150 MG tablet TAKE 1 TABLET BY MOUTH NOW. MAY REPEAT IN 3 DAYS IF NEEDED. (Patient not taking: Reported on 02/02/2021) 2 tablet 1  . fluconazole (DIFLUCAN) 150 MG tablet TAKE 1 TABLET BY MOUTH ONCE DAILY FOR 2 DAYS (Patient not taking: Reported on 02/02/2021) 2 tablet 0  . fluconazole (DIFLUCAN) 150 MG tablet TAKE 1 TABLET BY MOUTH NOW, MAY REPEAT IN 3 DAYS  IF NEEDED (Patient not taking: Reported on 02/02/2021) 2 tablet 0  . gabapentin (NEURONTIN) 100 MG capsule  (Patient not taking: Reported on 02/02/2021)    . gabapentin (NEURONTIN) 100 MG capsule TAKE 1 CAPSULE BY MOUTH THREE TIMES DAILY. (Patient not taking: Reported on 02/02/2021) 90 capsule 0  . hydrochlorothiazide (HYDRODIURIL) 25 MG tablet TAKE 1 TABLET BY MOUTH DAILY. (Patient not taking: Reported on 02/02/2021) 30 tablet 2  . metroNIDAZOLE (FLAGYL) 500 MG tablet TAKE 1 TABLET (500 MG TOTAL) BY MOUTH 2 (TWO) TIMES DAILY FOR 7 DAYS. (Patient not taking: Reported on 02/02/2021) 14 tablet 0  . metroNIDAZOLE (FLAGYL) 500 MG tablet TAKE 1 TAKE TABLET BY MOUTH TWICE A DAY FOR 7 DAYS (Patient not taking: Reported on 02/02/2021) 14 tablet 0  . nitrofurantoin, macrocrystal-monohydrate, (MACROBID) 100 MG capsule TAKE 1 CAPSULE BY MOUTH TWICE DAILY AFTER MEALS FOR 5 DAYS. (Patient not taking: Reported on 02/02/2021) 10 capsule 0  . penicillin v potassium (VEETID) 500 MG tablet TAKE 1 TABLET (500 MG TOTAL) BY MOUTH 2 (TWO) TIMES DAILY FOR 10 DAYS. (Patient not taking: Reported on 02/02/2021) 20 tablet 0   No facility-administered medications prior to visit.    Allergies  Allergen Reactions  . Ibuprofen     Causes ulcers    ROS As per HPI  PE: Vitals with BMI 02/02/2021 01/04/2021 11/24/2020  Height - 5' 4.5" 5' 4.5"  Weight 275 lbs 13 oz 258 lbs 3 oz 263 lbs 3 oz  BMI 46.63 43.65 44.5  Systolic 129 126 832  Diastolic 86 85 70  Pulse 76 80 69  Some encounter information is  confidential and restricted. Go to Review Flowsheets activity to see all data.     Gen: Alert, well appearing.  Patient is oriented to person, place, time, and situation. AFFECT: pleasant, lucid thought and speech. CV: RRR, no m/r/g.   LUNGS: CTA bilat, nonlabored resps, good aeration in all lung fields.   LABS:    Chemistry      Component Value Date/Time   NA 138 01/04/2021 1548   K 4.4 01/04/2021 1548   CL 104 01/04/2021 1548   CO2 25 01/04/2021 1548   BUN 6 01/04/2021 1548   CREATININE 0.70 01/04/2021 1548      Component Value Date/Time   CALCIUM 8.8 01/04/2021 1548   ALKPHOS 66 11/24/2020 0927   AST 11 11/24/2020 0927   ALT 5 11/24/2020 0927   BILITOT 0.3 11/24/2020 0927     Lab Results  Component Value Date   WBC 7.9 11/24/2020   HGB 12.0 11/24/2020   HCT 37.3 11/24/2020   MCV 90.6 11/24/2020   PLT 305.0 11/24/2020    IMPRESSION AND PLAN:  HTN, home bp up some but question of cuff/machine accuracy. She'll get new bp cuff and check daily and send me the numbers in mychart in a couple weeks. No changes today.  An After Visit Summary was printed and given to the patient.  FOLLOW UP: Return for keep aug 19 visit appt.  Signed:  Santiago Bumpers, MD           02/02/2021

## 2021-02-05 ENCOUNTER — Other Ambulatory Visit (HOSPITAL_COMMUNITY): Payer: Self-pay

## 2021-02-05 MED FILL — Cyanocobalamin Inj 1000 MCG/ML: INTRAMUSCULAR | 84 days supply | Qty: 6 | Fill #0 | Status: AC

## 2021-02-07 ENCOUNTER — Other Ambulatory Visit (HOSPITAL_COMMUNITY): Payer: Self-pay

## 2021-02-09 ENCOUNTER — Other Ambulatory Visit (HOSPITAL_COMMUNITY): Payer: Self-pay

## 2021-02-19 ENCOUNTER — Other Ambulatory Visit (HOSPITAL_COMMUNITY): Payer: Self-pay

## 2021-02-19 MED FILL — Metoprolol Succinate Tab ER 24HR 100 MG (Tartrate Equiv): ORAL | 30 days supply | Qty: 30 | Fill #0 | Status: AC

## 2021-02-19 MED FILL — Gabapentin Cap 100 MG: ORAL | 30 days supply | Qty: 90 | Fill #1 | Status: AC

## 2021-02-20 ENCOUNTER — Telehealth (HOSPITAL_COMMUNITY): Payer: Self-pay

## 2021-02-20 ENCOUNTER — Other Ambulatory Visit (HOSPITAL_COMMUNITY): Payer: Self-pay

## 2021-02-20 ENCOUNTER — Encounter (HOSPITAL_COMMUNITY): Payer: Self-pay | Admitting: Psychiatry

## 2021-02-20 ENCOUNTER — Encounter: Payer: Self-pay | Admitting: Family Medicine

## 2021-02-20 ENCOUNTER — Telehealth (INDEPENDENT_AMBULATORY_CARE_PROVIDER_SITE_OTHER): Payer: 59 | Admitting: Psychiatry

## 2021-02-20 ENCOUNTER — Other Ambulatory Visit: Payer: Self-pay

## 2021-02-20 VITALS — Wt 275.0 lb

## 2021-02-20 DIAGNOSIS — F429 Obsessive-compulsive disorder, unspecified: Secondary | ICD-10-CM | POA: Diagnosis not present

## 2021-02-20 DIAGNOSIS — F419 Anxiety disorder, unspecified: Secondary | ICD-10-CM

## 2021-02-20 DIAGNOSIS — F331 Major depressive disorder, recurrent, moderate: Secondary | ICD-10-CM | POA: Diagnosis not present

## 2021-02-20 MED ORDER — DULOXETINE HCL 20 MG PO CPEP
20.0000 mg | ORAL_CAPSULE | Freq: Two times a day (BID) | ORAL | 0 refills | Status: DC
  Filled 2021-02-20: qty 180, 90d supply, fill #0

## 2021-02-20 MED ORDER — LISINOPRIL 10 MG PO TABS
10.0000 mg | ORAL_TABLET | Freq: Every day | ORAL | 0 refills | Status: DC
  Filled 2021-02-20: qty 30, 30d supply, fill #0

## 2021-02-20 MED ORDER — LAMOTRIGINE 150 MG PO TABS
150.0000 mg | ORAL_TABLET | Freq: Every day | ORAL | 0 refills | Status: DC
  Filled 2021-02-20: qty 90, 90d supply, fill #0

## 2021-02-20 MED ORDER — DOXEPIN HCL 10 MG PO CAPS
10.0000 mg | ORAL_CAPSULE | Freq: Every day | ORAL | 1 refills | Status: DC
  Filled 2021-02-20: qty 60, 30d supply, fill #0
  Filled 2021-04-10: qty 60, 30d supply, fill #1

## 2021-02-20 MED FILL — Alprazolam Tab 1 MG: ORAL | 30 days supply | Qty: 120 | Fill #1 | Status: CN

## 2021-02-20 NOTE — Telephone Encounter (Signed)
See below

## 2021-02-20 NOTE — Progress Notes (Signed)
Virtual Visit via Telephone Note  I connected with Toni [name] on 02/20/21 at  4:00 PM EDT by telephone and verified that I am speaking with the correct person using two identifiers.  Location: Patient: home Provider: home office   I discussed the limitations, risks, security and privacy concerns of performing an evaluation and management service by telephone and the availability of in person appointments. I also discussed with the patient that there may be a patient responsible charge related to this service. The patient expressed understanding and agreed to proceed.   History of Present Illness: Patient is evaluated by phone session.  She is doing better with increased Cymbalta dose.  She is taking 20 mg twice a day.  She is still having insomnia and recently her PCP started her on Seroquel but she did not notice any improvement.  She actually gained weight since the last visit.  She is not sure but it could be due to Seroquel.  Her job is stressful but going okay.  She admitted her mood sometimes fluctuates but denies any recent crying spells, suicidal thoughts.  She feels her depression is better with the Cymbalta.  She still have obsessive thoughts and sometimes she unconsciously having racing thoughts but denies any paranoia, hallucination.  She does not feel she need to go back to therapy on a regular basis but like to see Denyse Amass if needed.  She is taking Xanax, gabapentin which is prescribed by her PCP.  She has chronic hip and leg pain.  Recently she had blood work.  Her BUN/creatinine is normal.  Past Psychiatric History: H/Odepression after got married. Tried Zoloft for yearsbut stopped afterseizure.PCP tried trazodone, Ambien and Seroquel but  did not work. We tried higher dose of Cymbalta but developed side effects. H/Oirritability, poor impulse control with excessive buying and shopping but no history of mania, psychosis, suicidal attempt or inpatient treatment.  Recent  Results (from the past 2160 hour(s))  CBC with Differential/Platelet     Status: None   Collection Time: 11/24/20  9:27 AM  Result Value Ref Range   WBC 7.9 4.0 - 10.5 K/uL   RBC 4.12 3.87 - 5.11 Mil/uL   Hemoglobin 12.0 12.0 - 15.0 g/dL   HCT 70.9 62.8 - 36.6 %   MCV 90.6 78.0 - 100.0 fl   MCHC 32.1 30.0 - 36.0 g/dL   RDW 29.4 76.5 - 46.5 %   Platelets 305.0 150.0 - 400.0 K/uL   Neutrophils Relative % 53.4 43.0 - 77.0 %   Lymphocytes Relative 38.8 12.0 - 46.0 %   Monocytes Relative 5.8 3.0 - 12.0 %   Eosinophils Relative 1.9 0.0 - 5.0 %   Basophils Relative 0.1 0.0 - 3.0 %   Neutro Abs 4.2 1.4 - 7.7 K/uL   Lymphs Abs 3.1 0.7 - 4.0 K/uL   Monocytes Absolute 0.5 0.1 - 1.0 K/uL   Eosinophils Absolute 0.2 0.0 - 0.7 K/uL   Basophils Absolute 0.0 0.0 - 0.1 K/uL  Comprehensive metabolic panel     Status: Abnormal   Collection Time: 11/24/20  9:27 AM  Result Value Ref Range   Sodium 138 135 - 145 mEq/L   Potassium 4.0 3.5 - 5.1 mEq/L   Chloride 101 96 - 112 mEq/L   CO2 30 19 - 32 mEq/L   Glucose, Bld 73 70 - 99 mg/dL   BUN 24 (H) 6 - 23 mg/dL   Creatinine, Ser 0.35 0.40 - 1.20 mg/dL   Total Bilirubin 0.3 0.2 -  1.2 mg/dL   Alkaline Phosphatase 66 39 - 117 U/L   AST 11 0 - 37 U/L   ALT 5 0 - 35 U/L   Total Protein 6.5 6.0 - 8.3 g/dL   Albumin 3.8 3.5 - 5.2 g/dL   GFR 16.10 (L) >96.04 mL/min    Comment: Calculated using the CKD-EPI Creatinine Equation (2021)   Calcium 8.9 8.4 - 10.5 mg/dL  DRUG MONITORING, PANEL 8 WITH CONFIRMATION, URINE     Status: Abnormal   Collection Time: 11/24/20  9:27 AM  Result Value Ref Range   Alcohol Metabolites NEGATIVE ng/mL   Amphetamines NEGATIVE <500 ng/mL   Benzodiazepines POSITIVE (A) <100 ng/mL   Alphahydroxyalprazolam 127 (H) <25 ng/mL   Alphahydroxymidazolam NEGATIVE <50 ng/mL   Alphahydroxytriazolam NEGATIVE <50 ng/mL   Aminoclonazepam NEGATIVE <25 ng/mL   Hydroxyethylflurazepam NEGATIVE <50 ng/mL   Lorazepam NEGATIVE <50 ng/mL    Nordiazepam 602 (H) <50 ng/mL   Oxazepam 937 (H) <50 ng/mL   Temazepam 1,637 (H) <50 ng/mL   Benzodiazepines Comments      Comment: See Benzodiazepines Notes, LDT Notes   Buprenorphine, Urine NEGATIVE <5 ng/mL   Cocaine Metabolite NEGATIVE <150 ng/mL   6 Acetylmorphine NEGATIVE <10 ng/mL   Marijuana Metabolite NEGATIVE <20 ng/mL   MDMA NEGATIVE <500 ng/mL   Opiates NEGATIVE CONFIRMED <100 ng/mL   Codeine NEGATIVE <50 ng/mL   Hydrocodone NEGATIVE <50 ng/mL   Hydromorphone NEGATIVE <50 ng/mL   Morphine NEGATIVE <50 ng/mL   Norhydrocodone NEGATIVE <50 ng/mL   Opiates Comments      Comment: See LDT Notes   Oxycodone POSITIVE (A) <100 ng/mL   Noroxycodone >10,000 (H) <50 ng/mL   Oxycodone >10,000 (H) <50 ng/mL   Oxymorphone 4,060 (H) <50 ng/mL   Oxycodone Comments      Comment: See Oxycodone Notes, LDT Notes   Creatinine 185.2 mg/dL   pH 5.5 4.5 - 9.0   Oxidant NEGATIVE mcg/mL  Lipid panel     Status: Abnormal   Collection Time: 11/24/20  9:27 AM  Result Value Ref Range   Cholesterol 223 (H) 0 - 200 mg/dL    Comment: ATP III Classification       Desirable:  < 200 mg/dL               Borderline High:  200 - 239 mg/dL          High:  > = 540 mg/dL   Triglycerides 981.1 (H) 0.0 - 149.0 mg/dL    Comment: Normal:  <914 mg/dLBorderline High:  150 - 199 mg/dL   HDL 78.29 >56.21 mg/dL   VLDL 30.8 (H) 0.0 - 65.7 mg/dL   Total CHOL/HDL Ratio 3     Comment:                Men          Women1/2 Average Risk     3.4          3.3Average Risk          5.0          4.42X Average Risk          9.6          7.13X Average Risk          15.0          11.0                       NonHDL 149.86  Comment: NOTE:  Non-HDL goal should be 30 mg/dL higher than patient's LDL goal (i.e. LDL goal of < 70 mg/dL, would have non-HDL goal of < 100 mg/dL)  TSH     Status: None   Collection Time: 11/24/20  9:27 AM  Result Value Ref Range   TSH 2.15 0.35 - 4.50 uIU/mL  Vitamin B12     Status: None   Collection  Time: 11/24/20  9:27 AM  Result Value Ref Range   Vitamin B-12 766 211 - 911 pg/mL  VITAMIN D 25 Hydroxy (Vit-D Deficiency, Fractures)     Status: None   Collection Time: 11/24/20  9:27 AM  Result Value Ref Range   VITD 41.07 30.00 - 100.00 ng/mL  Iron, TIBC and Ferritin Panel     Status: Abnormal   Collection Time: 11/24/20  9:27 AM  Result Value Ref Range   Iron 74 40 - 190 mcg/dL   TIBC 161445 096250 - 045450 mcg/dL (calc)   %SAT 17 16 - 45 % (calc)   Ferritin 11 (L) 16 - 232 ng/mL  LDL cholesterol, direct     Status: None   Collection Time: 11/24/20  9:27 AM  Result Value Ref Range   Direct LDL 75.0 mg/dL    Comment: Optimal:  <409<100 mg/dLNear or Above Optimal:  100-129 mg/dLBorderline High:  130-159 mg/dLHigh:  160-189 mg/dLVery High:  >190 mg/dL  DM TEMPLATE     Status: None   Collection Time: 11/24/20  9:27 AM  Result Value Ref Range   Notes and Comments      Comment: This drug testing is for medical treatment only. Analysis was performed as non-forensic testing and these results should be used only by healthcare providers to render diagnosis or treatment, or to monitor progress of medical conditions. . Benzodiazepines Notes: aOH Alprazolam detected is consistent with the use of  the drug Alprazolam. . Nordiazepam, Temazepam, Oxazepam detected is consistent  with the use of the drug Diazepam. . Temazepam  can be a prescribed drug. Nordiazepam is also  a metabolite of Chlordiazepoxide and Clorazepate.  Oxazepam can be a prescribed drug and is also a  metabolite of Chlordiazepoxide, Clorazepate and  Temazepam. . Nordiazepam, Oxazepam detected is consistent with the  use of the drug Chlordiazepoxide. . Nordiazepam is a also metabolite of Diazepam and  Clorazepate. Oxazepam can be a prescribed drug and is  also a metabolite of Diazepam and Temazepam. . Temazepam, Oxazepam detected is consistent with the  use of the drug Temaze pam. . Temazepam  can be a prescribed drug and  is also a  metabolite of Diazepam. Oxazepam can be a prescribed  drug and also a metabolite of Diazepam, Chlordiazepoxide,  Clorazepate and Temazepam. . Oxazepam detected is consistent with the use of the  drug Oxazepam. . Oxazepam can be a prescribed drug and is also a  metabolite of Diazepam, Chlordiazepoxide, Clorazepate  and Temazepam. . Oxycodone Notes: Oxycodone, Noroxycodone, Oxymorphone detected is  consistent with the use of the drug Oxycodone. . Oxymorphone detected is consistent with the use of the  drug Oxymorphone. . Oxymorphone can be a prescribed drug and is also a  metabolite of Oxycodone. Marland Kitchen. LDT Notes: Confirmation tests were developed and their analytical  performance characteristics have been determined by  [facility]. It has not been cleared or approved  by the FDA. This assay has been validated pursuant to  the CLIA regulations and is used for clinical purposes. . . Healthcare Prov iders needing Interpretation  assistance,  please contact us at [phone] ([phone])  M-F, 8am to 10pm EST   POCT urinalysis dipstick     Status: Abnormal   Collection Time: 01/04/21  3:41 PM  Result Value Ref Range   Color, UA Yellow    Clarity, UA Clear    Glucose, UA Negative Negative   Bilirubin, UA Negative    Ketones, UA 1+    Spec Grav, UA 1.020 1.010 - 1.025   Blood, UA 2+    pH, UA 6.0 5.0 - 8.0   Protein, UA Positive (A) Negative   Urobilinogen, UA 0.2 0.2 or 1.0 E.U./dL   Nitrite, UA Negative    Leukocytes, UA Negative Negative   Appearance Clear    Odor No   Basic metabolic panel     Status: None   Collection Time: 01/04/21  3:48 PM  Result Value Ref Range   Sodium 138 135 - 145 mEq/L   Potassium 4.4 3.5 - 5.1 mEq/L   Chloride 104 96 - 112 mEq/L   CO2 25 19 - 32 mEq/L   Glucose, Bld 94 70 - 99 mg/dL   BUN 6 6 - 23 mg/dL   Creatinine, Ser 2.35 0.40 - 1.20 mg/dL   GFR 361.44 >31.54 mL/min    Comment: Calculated using the CKD-EPI  Creatinine Equation (2021)   Calcium 8.8 8.4 - 10.5 mg/dL  Urinalysis, Routine w reflex microscopic     Status: Abnormal   Collection Time: 01/04/21  3:48 PM  Result Value Ref Range   Color, Urine YELLOW Yellow;Lt. Yellow;Straw;Dark Yellow;Amber;Green;Red;Brown   APPearance CLEAR Clear;Turbid;Slightly Cloudy;Cloudy   Specific Gravity, Urine 1.020 1.000 - 1.030   pH 6.0 5.0 - 8.0   Total Protein, Urine NEGATIVE Negative   Urine Glucose NEGATIVE Negative   Ketones, ur 15 (A) Negative   Bilirubin Urine NEGATIVE Negative   Hgb urine dipstick SMALL (A) Negative   Urobilinogen, UA 0.2 0.0 - 1.0   Leukocytes,Ua NEGATIVE Negative   Nitrite NEGATIVE Negative   WBC, UA 0-2/hpf 0-2/hpf   RBC / HPF 0-2/hpf 0-2/hpf   Squamous Epithelial / LPF Rare(0-4/hpf) Rare(0-4/hpf)     Psychiatric Specialty Exam: Physical Exam  Review of Systems  Weight 275 lb (124.7 kg).There is no height or weight on file to calculate BMI.  General Appearance: NA  Eye Contact:  NA  Speech:  Clear and Coherent  Volume:  Normal  Mood:  Anxious  Affect:  NA  Thought Process:  Goal Directed  Orientation:  Full (Time, Place, and Person)  Thought Content:  Obsessions and Rumination  Suicidal Thoughts:  No  Homicidal Thoughts:  No  Memory:  Immediate;   Good Recent;   Good Remote;   Good  Judgement:  Good  Insight:  Good  Psychomotor Activity:  NA  Concentration:  Concentration: Good and Attention Span: Good  Recall:  Good  Fund of Knowledge:  Good  Language:  Good  Akathisia:  No  Handed:  Right  AIMS (if indicated):     Assets:  Communication Skills Desire for Improvement Housing Resilience Talents/Skills Transportation  ADL's:  Intact  Cognition:  WNL  Sleep:   Fair      Assessment and Plan: Major depressive disorder, recurrent.  Anxiety.  OCD.  Her depression and anxiety and OCD is stable but she still have insomnia.  She is prescribed Seroquel from PCP but that did not help and she actually  weight gain since the last visit.  I recommend to discontinue quetiapine.  We will try doxepin low-dose to help her anxiety, OCD and insomnia.  She will continue Cymbalta 20 mg twice a day, Lamictal 150 mg daily.  She has no rash or any itching.  She is getting Xanax and gabapentin from PCP.  I reviewed blood work results.  I also encouraged she should consider seeing Denyse Amass on a regular basis to prevent having these symptoms.  Recommended to call us back if she is any question or any concern.  Follow-up in 3 months.  Follow Up Instructions:    I discussed the assessment and treatment plan with the patient. The patient was provided an opportunity to ask questions and all were answered. The patient agreed with the plan and demonstrated an understanding of the instructions.   The patient was advised to call back or seek an in-person evaluation if the symptoms worsen or if the condition fails to improve as anticipated.  I provided 19 minutes of non-face-to-face time during this encounter.   Cleotis Nipper, MD

## 2021-02-20 NOTE — Telephone Encounter (Signed)
Medication refill request - Telephone call from pt stating she has a late appointment with Dr. Lolly Mustache today but is requesting he go ahead and send in refills of her Cymbalta and Lamictal so she can pick them up before too later today.  Patient stated on message she left with request she feels she is doing well with these two medications, plans to still keep appointment for 4:00 pm today, but wants to pick up refills prior to appointment time today if Dr. Lolly Mustache can go ahead and send these in.

## 2021-02-20 NOTE — Telephone Encounter (Signed)
Done

## 2021-02-20 NOTE — Telephone Encounter (Signed)
Top numbers great but bottom numbers still too high (90-100). Continue current dosing of metoprolol and hctz. Need to add a 3rd bp pill. pls eRx lisinopril 10mg , 1 qd, #30, no RF. Cont home bp and heart rate checks a few times a week and bring in for review with me in 2 wks and we'll monitor potassium and kidney function at that time.

## 2021-02-20 NOTE — Telephone Encounter (Signed)
Pt was last seen 02/02/21 for f/u HTN. Last refill for xanax 10/31/20 (120,5)    Please advise.

## 2021-02-21 ENCOUNTER — Other Ambulatory Visit (HOSPITAL_COMMUNITY): Payer: Self-pay

## 2021-02-21 ENCOUNTER — Encounter: Payer: Self-pay | Admitting: Family Medicine

## 2021-02-21 MED FILL — Alprazolam Tab 1 MG: ORAL | 30 days supply | Qty: 120 | Fill #1 | Status: AC

## 2021-02-21 MED FILL — Alprazolam Tab 1 MG: ORAL | 30 days supply | Qty: 120 | Fill #1 | Status: CN

## 2021-02-21 NOTE — Telephone Encounter (Signed)
MyChart message sent to pt:  Good afternoon,   After speaking with the pharmacy, you can call the pharmacy on Friday to see if they can refill it. Right now it is too early to get the Rx filled and insurance may not cover it. Even if you call Friday to fill it, it is a possibility that your insurance wont cover it if it is too early.  Per Dr. Milinda Cave: As we've talked about before, increasing xanax is not an option. I am at the max that I'm comfortable prescribing.   Please give the pharmacy a call on Friday to get the refill.

## 2021-02-21 NOTE — Telephone Encounter (Signed)
Please advise if okay to let pt get Rx earlier than usual. Pharmacy will be closed on refill date.  Pt message:  I don't recall having that conversation with Dr. Milinda Cave but I will talk to him about it when I come in in a couple of weeks. [place] have court [place] on Friday and may not make it back in time to get my rx. Im ok with paying for it the pharmacy needs your ok for me to get it tomorrow 1 day early. If there are any questions please call me

## 2021-02-21 NOTE — Telephone Encounter (Signed)
Spoke with Chales Abrahams, at pharmacy stating last refill was 01/26/21 #120 30 d supply

## 2021-02-21 NOTE — Telephone Encounter (Signed)
OK, early RF okay with me.

## 2021-02-21 NOTE — Telephone Encounter (Signed)
OK, early RF is ok with me.

## 2021-02-21 NOTE — Telephone Encounter (Signed)
Notified pharmacy that refill can be done.

## 2021-02-21 NOTE — Telephone Encounter (Signed)
As we've talked about before, increasing xanax is not an option. I am at the max that I'm comfortable prescribing.  Sorry. As far as any authorization for early RF I'll need to know when she last had it filled-pls check with her pharmacy and let me know-thx

## 2021-02-22 ENCOUNTER — Encounter: Payer: 59 | Attending: Family Medicine | Admitting: Skilled Nursing Facility1

## 2021-02-22 ENCOUNTER — Other Ambulatory Visit: Payer: Self-pay

## 2021-02-22 ENCOUNTER — Other Ambulatory Visit (HOSPITAL_COMMUNITY): Payer: Self-pay

## 2021-02-22 DIAGNOSIS — E669 Obesity, unspecified: Secondary | ICD-10-CM | POA: Diagnosis not present

## 2021-02-22 DIAGNOSIS — E162 Hypoglycemia, unspecified: Secondary | ICD-10-CM | POA: Diagnosis not present

## 2021-02-22 NOTE — Progress Notes (Signed)
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 Pt states she does follow a psychiatrist and phycologist. Pt states she is up and down all night urinating.  Pt states she checks her blood sugar 2 times a day when feeling "off" which is usually in the morning and afternoon always after meals 1-2 hours after eating breakfast feeling sweaty and lethargic and drained lack of focus: stating her numbers are 60-64; when feeling the low feel she cannot get enough to eat so east 2 packs of peanut butter crackers to bring her sugar up to 80.    Estimated daily fluid intake: 64 oz Supplements: calcium 1 a day, vitamin B12 1000 mcg intramuscular 2 times a week, iron, potassium, vitamin d 85462 once weekly Sleep: light sleeper with a loud roommate so not great, 5-6 hours not restful  Stress / self-care: cymbalta has really been helping Current average weekly physical activity: ADL's  Body Composition Scale 02/22/2021  Current Body Weight 265.6  Total Body Fat % 45.6  Visceral Fat   Fat-Free Mass %    Total Body Water % 41.6  Muscle-Mass lbs   BMI   Body Fat Displacement          Torso  lbs          Left Leg  lbs          Right Leg  lbs          Left Arm  lbs          Right Arm   lbs     24-Hr Dietary Recall First Meal: coffee + greek yogurt + 1 banana or raisin cinn bagel  Snack: cheese and crackers hypoglycemia snack 2 packs of crackers Second Meal: half  sandwich + chips Snack: crackers or fruit Third Meal: canned chicken noodle soup or frozen meals Snack:  Beverages: water, coffee + flavored cream and sugar  Estimated Energy Needs Calories: 1300-1500   NUTRITION INTERVENTION  Nutrition education (E-1) on the following topics:  . Micronutrient deficiencies   . Reactive hypoglycemia  . Creation of balanced meals . Ulceration of RYGB pouch . Apropriate follow up with her bariatric surgeon   Handouts Provided Include   Vitamin/mineral sheet  Meals ideas sheet  Learning Style & Readiness for Change Teaching method utilized: Visual & Auditory  Demonstrated degree of understanding via: Teach Back  Barriers to learning/adherence to lifestyle change: none identified   Goals Established by Pt . Avoid NSAIDs completely: Aleieve, ibuprofen, advil, etc.  . Get in with a bariatric surgeon-[facility] or [facility] . Try Premier Protein or Fairlife in your coffee . Keep up eating every 3 hours   . Snacks: 1 small piece of fruit + 1/4 cup nuts or 1 small piece of fruit + cheese or handful [place] or greek yogurt or 1 cheese stick or any non starchy vegetable  . If you do have a low blood sugar have 4 ounces 100% fruit juice and then once up a meal with  complex carb + protein  . Be sure to have non starchy vegetables with lunch and dinner  . Start taking the appropriate multivitamin and 3 calcium every at least 2 hours apart including from your iron supplement    MONITORING & EVALUATION Dietary intake, weekly physical activity  Next Steps  Patient is to retrun in 6 weeks

## 2021-02-27 ENCOUNTER — Encounter: Payer: Self-pay | Admitting: Family Medicine

## 2021-02-27 ENCOUNTER — Other Ambulatory Visit (HOSPITAL_COMMUNITY): Payer: Self-pay

## 2021-02-27 ENCOUNTER — Other Ambulatory Visit: Payer: Self-pay

## 2021-02-27 MED ORDER — HYDROCHLOROTHIAZIDE 25 MG PO TABS
25.0000 mg | ORAL_TABLET | Freq: Every day | ORAL | 1 refills | Status: DC
  Filled 2021-02-27: qty 30, 30d supply, fill #0
  Filled 2021-03-12: qty 30, 30d supply, fill #1

## 2021-02-27 NOTE — Telephone Encounter (Signed)
Pt was advised of the following:  Good morning,   We have sent in a new prescription for the hctz. Based on the last MyChart message you sent with your blood pressure readings from 5/17, Dr.McGowen recommended the following:    Continue current dosing of metoprolol and hctz. Need to add a 3rd bp pill. pls start lisinopril 10mg , 1 daily. Cont home bp and heart rate checks a few times a week and bring in for review with me in 2 wks and we'll monitor potassium and kidney function at that time.     Please call our office to schedule an appointment at [phone].

## 2021-03-01 ENCOUNTER — Telehealth: Payer: Self-pay

## 2021-03-01 DIAGNOSIS — Z111 Encounter for screening for respiratory tuberculosis: Secondary | ICD-10-CM

## 2021-03-01 NOTE — Telephone Encounter (Signed)
Patient requesting blood test for TB for new employment.  Will Dr. Milinda Cave approve order?  Please enter order if approved by provider    Note completed.

## 2021-03-02 NOTE — Telephone Encounter (Signed)
Please advise,   See Dana's note

## 2021-03-02 NOTE — Telephone Encounter (Signed)
OK, quantiferon gold test ordered.

## 2021-03-02 NOTE — Telephone Encounter (Signed)
LM for pt to return call regarding appt °

## 2021-03-04 ENCOUNTER — Emergency Department (HOSPITAL_BASED_OUTPATIENT_CLINIC_OR_DEPARTMENT_OTHER): Payer: 59

## 2021-03-04 ENCOUNTER — Emergency Department (HOSPITAL_BASED_OUTPATIENT_CLINIC_OR_DEPARTMENT_OTHER)
Admission: EM | Admit: 2021-03-04 | Discharge: 2021-03-05 | Disposition: A | Discharge status: Home / Self Care | Payer: 59 | Attending: Emergency Medicine | Admitting: Emergency Medicine

## 2021-03-04 ENCOUNTER — Emergency Department (HOSPITAL_COMMUNITY): Payer: 59

## 2021-03-04 ENCOUNTER — Encounter (HOSPITAL_BASED_OUTPATIENT_CLINIC_OR_DEPARTMENT_OTHER): Payer: Self-pay

## 2021-03-04 ENCOUNTER — Other Ambulatory Visit: Payer: Self-pay

## 2021-03-04 DIAGNOSIS — R519 Headache, unspecified: Secondary | ICD-10-CM | POA: Insufficient documentation

## 2021-03-04 DIAGNOSIS — I10 Essential (primary) hypertension: Secondary | ICD-10-CM | POA: Insufficient documentation

## 2021-03-04 DIAGNOSIS — M542 Cervicalgia: Secondary | ICD-10-CM | POA: Insufficient documentation

## 2021-03-04 DIAGNOSIS — R42 Dizziness and giddiness: Secondary | ICD-10-CM | POA: Diagnosis not present

## 2021-03-04 DIAGNOSIS — R202 Paresthesia of skin: Secondary | ICD-10-CM | POA: Insufficient documentation

## 2021-03-04 DIAGNOSIS — I6529 Occlusion and stenosis of unspecified carotid artery: Secondary | ICD-10-CM | POA: Diagnosis not present

## 2021-03-04 DIAGNOSIS — Z79899 Other long term (current) drug therapy: Secondary | ICD-10-CM | POA: Insufficient documentation

## 2021-03-04 LAB — CBC WITH DIFFERENTIAL/PLATELET
Abs Immature Granulocytes: 0.01 10*3/uL (ref 0.00–0.07)
Basophils Absolute: 0.1 10*3/uL (ref 0.0–0.1)
Basophils Relative: 1 %
Eosinophils Absolute: 0.1 10*3/uL (ref 0.0–0.5)
Eosinophils Relative: 1 %
HCT: 38.1 % (ref 36.0–46.0)
Hemoglobin: 12.2 g/dL (ref 12.0–15.0)
Immature Granulocytes: 0 %
Lymphocytes Relative: 41 %
Lymphs Abs: 2.6 10*3/uL (ref 0.7–4.0)
MCH: 29.4 pg (ref 26.0–34.0)
MCHC: 32 g/dL (ref 30.0–36.0)
MCV: 91.8 fL (ref 80.0–100.0)
Monocytes Absolute: 0.5 10*3/uL (ref 0.1–1.0)
Monocytes Relative: 8 %
Neutro Abs: 3.2 10*3/uL (ref 1.7–7.7)
Neutrophils Relative %: 49 %
Platelets: 332 10*3/uL (ref 150–400)
RBC: 4.15 MIL/uL (ref 3.87–5.11)
RDW: 14 % (ref 11.5–15.5)
WBC: 6.4 10*3/uL (ref 4.0–10.5)
nRBC: 0 % (ref 0.0–0.2)

## 2021-03-04 LAB — BASIC METABOLIC PANEL
Anion gap: 9 (ref 5–15)
BUN: 14 mg/dL (ref 6–20)
CO2: 23 mmol/L (ref 22–32)
Calcium: 8.3 mg/dL — ABNORMAL LOW (ref 8.9–10.3)
Chloride: 107 mmol/L (ref 98–111)
Creatinine, Ser: 0.72 mg/dL (ref 0.44–1.00)
GFR, Estimated: >60 mL/min (ref >60)
Glucose, Bld: 91 mg/dL (ref 70–99)
Potassium: 4.3 mmol/L (ref 3.5–5.1)
Sodium: 139 mmol/L (ref 135–145)

## 2021-03-04 MED ORDER — FENTANYL CITRATE (PF) 100 MCG/2ML IJ SOLN: 50.0000 ug | Freq: Once | INTRAMUSCULAR | Status: DC

## 2021-03-04 MED ORDER — PROCHLORPERAZINE EDISYLATE 10 MG/2ML IJ SOLN
10.0000 mg | Freq: Once | INTRAMUSCULAR | Status: AC
  Administered 2021-03-04: 10 mg via INTRAVENOUS
  Filled 2021-03-04: qty 2

## 2021-03-04 MED ORDER — DIPHENHYDRAMINE HCL 50 MG/ML IJ SOLN
25.0000 mg | Freq: Once | INTRAMUSCULAR | Status: AC
  Administered 2021-03-04: 25 mg via INTRAVENOUS
  Filled 2021-03-04: qty 1

## 2021-03-04 MED ORDER — KETOROLAC TROMETHAMINE 15 MG/ML IJ SOLN
15.0000 mg | Freq: Once | INTRAMUSCULAR | Status: AC
  Administered 2021-03-04: 15 mg via INTRAVENOUS
  Filled 2021-03-04: qty 1

## 2021-03-04 MED ORDER — GADOBUTROL 1 MMOL/ML IV SOLN
10.0000 mL | Freq: Once | INTRAVENOUS | Status: AC | PRN
  Administered 2021-03-04: 10 mL via INTRAVENOUS

## 2021-03-04 MED ORDER — METHOCARBAMOL 500 MG PO TABS
1000.0000 mg | ORAL_TABLET | Freq: Once | ORAL | Status: AC
  Administered 2021-03-04: 1000 mg via ORAL
  Filled 2021-03-04: qty 2

## 2021-03-04 MED ORDER — DEXAMETHASONE SODIUM PHOSPHATE 10 MG/ML IJ SOLN
10.0000 mg | Freq: Once | INTRAMUSCULAR | Status: AC
  Administered 2021-03-04: 10 mg via INTRAVENOUS
  Filled 2021-03-04: qty 1

## 2021-03-04 MED ORDER — IOHEXOL 350 MG/ML SOLN
75.0000 mL | Freq: Once | INTRAVENOUS | Status: AC | PRN
  Administered 2021-03-04: 75 mL via INTRAVENOUS

## 2021-03-04 MED ORDER — SODIUM CHLORIDE 0.9 % IV BOLUS
1000.0000 mL | Freq: Once | INTRAVENOUS | Status: AC
Start: 2021-03-04 — End: 2021-03-04
  Administered 2021-03-04: 1000 mL via INTRAVENOUS

## 2021-03-04 NOTE — ED Notes (Signed)
Handoff report given to Clydie Braun RN in triage.  Patient to transport via POV.

## 2021-03-04 NOTE — ED Notes (Signed)
States onset Thursday of stiff neck with pain getting worse.  Unable to move neck fully to left side.  States when move neck to left have pain shoots to back of right head

## 2021-03-04 NOTE — ED Notes (Signed)
Patient transported to CT 

## 2021-03-04 NOTE — ED Notes (Signed)
Patient left with mother to go the [facility]

## 2021-03-04 NOTE — ED Provider Notes (Signed)
54:34 PM 45 year old female presents to the emergency department in transfer from [facility] for MRI.  Complaining of right posterior neck pain.  Had CTA that showed a mild irregularity of the right ICA at C1/2.  MRA recommended for further characterization.  Imaging results pending at this time.  Patient states that her pain is currently rated at 8/10.  She last received medication for symptoms about 3 hours ago.  She is requesting something additional at this time.  12:00 AM Irregularity seen on CT is not visible on MRI.  MRI/MRA today are all normal.  No acute intracranial abnormality.  Plan for discharge with instruction for use of anti-inflammatories, muscle relaxers.  Return precautions discussed and provided. Patient discharged in stable condition with no unaddressed concerns.   MR ANGIO HEAD WO CONTRAST  Result Date: 03/04/2021 CLINICAL DATA:  Initial evaluation for acute headache, neck pain. EXAM: MRI HEAD WITHOUT CONTRAST MRA HEAD WITHOUT CONTRAST MRA NECK WITHOUT AND WITH CONTRAST TECHNIQUE: Multiplanar, multi-echo pulse sequences of the brain and surrounding structures were acquired without intravenous contrast. Angiographic images of the Circle of Willis were acquired using MRA technique without intravenous contrast. Angiographic images of the neck were acquired using MRA technique without and with intravenous contrast. Carotid stenosis measurements (when applicable) are obtained utilizing NASCET criteria, using the distal internal carotid diameter as the denominator. CONTRAST:  42mL GADAVIST GADOBUTROL 1 MMOL/ML IV SOLN COMPARISON:  Prior CTA from earlier the same day. FINDINGS: MRI HEAD FINDINGS Brain: Cerebral volume within normal limits for age. No significant cerebral white matter disease or other focal parenchymal signal abnormality. No abnormal foci of restricted diffusion to suggest acute or subacute ischemia. Gray-white matter differentiation well maintained. No  encephalomalacia to suggest chronic cortical infarction. No foci of susceptibility artifact to suggest acute or chronic intracranial hemorrhage. No mass lesion, midline shift or mass effect. No hydrocephalus or extra-axial fluid collection. Pituitary gland suprasellar region normal. Midline structures intact and normal. Vascular: Major intracranial vascular flow voids are well maintained and normal. Skull and upper cervical spine: Craniocervical junction within normal limits. Bone marrow signal intensity normal. No scalp soft tissue abnormality. Sinuses/Orbits: Globes and orbital soft tissues within normal limits. Paranasal sinuses are clear. No mastoid effusion. Inner ear structures within normal limits. Other: None. MRA HEAD FINDINGS ANTERIOR CIRCULATION: Both internal carotid arteries widely patent to the termini without stenosis. A1 segments widely patent. Normal anterior communicating artery complex. Both anterior cerebral arteries widely patent to their distal aspects without stenosis. No M1 stenosis or occlusion. Normal MCA bifurcations. Distal MCA branches well perfused and symmetric. POSTERIOR CIRCULATION: Both V4 segments patent to the vertebrobasilar junction without stenosis. Both PICA origins patent and normal. Basilar widely patent to its distal aspect without stenosis. Superior cerebellar arteries patent bilaterally. Both PCAs primarily supplied via the basilar and are well perfused to there distal aspects. No intracranial aneurysm or other vascular abnormality. MRA NECK FINDINGS AORTIC ARCH: Visualized aortic arch normal caliber with normal 3 vessel morphology. No hemodynamically significant stenosis seen about the origin of the great vessels. RIGHT CAROTID SYSTEM: Right common and internal carotid arteries widely patent without stenosis, evidence for dissection or occlusion. Previously question irregularity about the distal right ICA not definitely seen on this exam. No significant atheromatous  narrowing or stenosis about the right bifurcation. LEFT CAROTID SYSTEM: Left common and internal carotid arteries patent without stenosis, evidence for dissection or occlusion. No significant atheromatous irregularity or narrowing about the left bifurcation. VERTEBRAL ARTERIES: Both vertebral  arteries arise from subclavian arteries. No proximal subclavian artery stenosis. Left vertebral artery slightly dominant. Vertebral arteries patent without stenosis, evidence for dissection or occlusion. IMPRESSION: 1. Normal brain MRI for age. No acute intracranial abnormality or findings to explain patient's symptoms identified. 2. Normal intracranial MRA. 3. Normal MRA of the neck. Previously questioned irregularity about the distal cervical right ICA not seen on this exam. Electronically Signed   By: Rise Mu M.D.   On: 03/04/2021 22:57   MR Angiogram Neck W or Wo Contrast  Result Date: 03/04/2021 CLINICAL DATA:  Initial evaluation for acute headache, neck pain. EXAM: MRI HEAD WITHOUT CONTRAST MRA HEAD WITHOUT CONTRAST MRA NECK WITHOUT AND WITH CONTRAST TECHNIQUE: Multiplanar, multi-echo pulse sequences of the brain and surrounding structures were acquired without intravenous contrast. Angiographic images of the Circle of Willis were acquired using MRA technique without intravenous contrast. Angiographic images of the neck were acquired using MRA technique without and with intravenous contrast. Carotid stenosis measurements (when applicable) are obtained utilizing NASCET criteria, using the distal internal carotid diameter as the denominator. CONTRAST:  27mL GADAVIST GADOBUTROL 1 MMOL/ML IV SOLN COMPARISON:  Prior CTA from earlier the same day. FINDINGS: MRI HEAD FINDINGS Brain: Cerebral volume within normal limits for age. No significant cerebral white matter disease or other focal parenchymal signal abnormality. No abnormal foci of restricted diffusion to suggest acute or subacute ischemia. Gray-white  matter differentiation well maintained. No encephalomalacia to suggest chronic cortical infarction. No foci of susceptibility artifact to suggest acute or chronic intracranial hemorrhage. No mass lesion, midline shift or mass effect. No hydrocephalus or extra-axial fluid collection. Pituitary gland suprasellar region normal. Midline structures intact and normal. Vascular: Major intracranial vascular flow voids are well maintained and normal. Skull and upper cervical spine: Craniocervical junction within normal limits. Bone marrow signal intensity normal. No scalp soft tissue abnormality. Sinuses/Orbits: Globes and orbital soft tissues within normal limits. Paranasal sinuses are clear. No mastoid effusion. Inner ear structures within normal limits. Other: None. MRA HEAD FINDINGS ANTERIOR CIRCULATION: Both internal carotid arteries widely patent to the termini without stenosis. A1 segments widely patent. Normal anterior communicating artery complex. Both anterior cerebral arteries widely patent to their distal aspects without stenosis. No M1 stenosis or occlusion. Normal MCA bifurcations. Distal MCA branches well perfused and symmetric. POSTERIOR CIRCULATION: Both V4 segments patent to the vertebrobasilar junction without stenosis. Both PICA origins patent and normal. Basilar widely patent to its distal aspect without stenosis. Superior cerebellar arteries patent bilaterally. Both PCAs primarily supplied via the basilar and are well perfused to there distal aspects. No intracranial aneurysm or other vascular abnormality. MRA NECK FINDINGS AORTIC ARCH: Visualized aortic arch normal caliber with normal 3 vessel morphology. No hemodynamically significant stenosis seen about the origin of the great vessels. RIGHT CAROTID SYSTEM: Right common and internal carotid arteries widely patent without stenosis, evidence for dissection or occlusion. Previously question irregularity about the distal right ICA not definitely seen on  this exam. No significant atheromatous narrowing or stenosis about the right bifurcation. LEFT CAROTID SYSTEM: Left common and internal carotid arteries patent without stenosis, evidence for dissection or occlusion. No significant atheromatous irregularity or narrowing about the left bifurcation. VERTEBRAL ARTERIES: Both vertebral arteries arise from subclavian arteries. No proximal subclavian artery stenosis. Left vertebral artery slightly dominant. Vertebral arteries patent without stenosis, evidence for dissection or occlusion. IMPRESSION: 1. Normal brain MRI for age. No acute intracranial abnormality or findings to explain patient's symptoms identified. 2. Normal intracranial MRA. 3. Normal MRA of  the neck. Previously questioned irregularity about the distal cervical right ICA not seen on this exam. Electronically Signed   By: Rise Mu M.D.   On: 03/04/2021 22:57   MR BRAIN WO CONTRAST  Result Date: 03/04/2021 CLINICAL DATA:  Initial evaluation for acute headache, neck pain. EXAM: MRI HEAD WITHOUT CONTRAST MRA HEAD WITHOUT CONTRAST MRA NECK WITHOUT AND WITH CONTRAST TECHNIQUE: Multiplanar, multi-echo pulse sequences of the brain and surrounding structures were acquired without intravenous contrast. Angiographic images of the Circle of Willis were acquired using MRA technique without intravenous contrast. Angiographic images of the neck were acquired using MRA technique without and with intravenous contrast. Carotid stenosis measurements (when applicable) are obtained utilizing NASCET criteria, using the distal internal carotid diameter as the denominator. CONTRAST:  61mL GADAVIST GADOBUTROL 1 MMOL/ML IV SOLN COMPARISON:  Prior CTA from earlier the same day. FINDINGS: MRI HEAD FINDINGS Brain: Cerebral volume within normal limits for age. No significant cerebral white matter disease or other focal parenchymal signal abnormality. No abnormal foci of restricted diffusion to suggest acute or subacute  ischemia. Gray-white matter differentiation well maintained. No encephalomalacia to suggest chronic cortical infarction. No foci of susceptibility artifact to suggest acute or chronic intracranial hemorrhage. No mass lesion, midline shift or mass effect. No hydrocephalus or extra-axial fluid collection. Pituitary gland suprasellar region normal. Midline structures intact and normal. Vascular: Major intracranial vascular flow voids are well maintained and normal. Skull and upper cervical spine: Craniocervical junction within normal limits. Bone marrow signal intensity normal. No scalp soft tissue abnormality. Sinuses/Orbits: Globes and orbital soft tissues within normal limits. Paranasal sinuses are clear. No mastoid effusion. Inner ear structures within normal limits. Other: None. MRA HEAD FINDINGS ANTERIOR CIRCULATION: Both internal carotid arteries widely patent to the termini without stenosis. A1 segments widely patent. Normal anterior communicating artery complex. Both anterior cerebral arteries widely patent to their distal aspects without stenosis. No M1 stenosis or occlusion. Normal MCA bifurcations. Distal MCA branches well perfused and symmetric. POSTERIOR CIRCULATION: Both V4 segments patent to the vertebrobasilar junction without stenosis. Both PICA origins patent and normal. Basilar widely patent to its distal aspect without stenosis. Superior cerebellar arteries patent bilaterally. Both PCAs primarily supplied via the basilar and are well perfused to there distal aspects. No intracranial aneurysm or other vascular abnormality. MRA NECK FINDINGS AORTIC ARCH: Visualized aortic arch normal caliber with normal 3 vessel morphology. No hemodynamically significant stenosis seen about the origin of the great vessels. RIGHT CAROTID SYSTEM: Right common and internal carotid arteries widely patent without stenosis, evidence for dissection or occlusion. Previously question irregularity about the distal right ICA  not definitely seen on this exam. No significant atheromatous narrowing or stenosis about the right bifurcation. LEFT CAROTID SYSTEM: Left common and internal carotid arteries patent without stenosis, evidence for dissection or occlusion. No significant atheromatous irregularity or narrowing about the left bifurcation. VERTEBRAL ARTERIES: Both vertebral arteries arise from subclavian arteries. No proximal subclavian artery stenosis. Left vertebral artery slightly dominant. Vertebral arteries patent without stenosis, evidence for dissection or occlusion. IMPRESSION: 1. Normal brain MRI for age. No acute intracranial abnormality or findings to explain patient's symptoms identified. 2. Normal intracranial MRA. 3. Normal MRA of the neck. Previously questioned irregularity about the distal cervical right ICA not seen on this exam. Electronically Signed   By: Rise Mu M.D.   On: 03/04/2021 22:57      Antony Madura, PA-C 03/05/21 0001    Nira Conn, MD 03/05/21 [phone]

## 2021-03-04 NOTE — ED Provider Notes (Signed)
[facility] EMERGENCY DEPT Provider Note   CSN: [id number] Arrival date & time: 03/04/21  1303     History Chief Complaint  Patient presents with  . Neck Pain    Onset Thursday    Toni [name] is a 44 y.o. female.  HPI 45 year old female presents with a chief complaint of neck pain.  Pain hurts in the right side of her posterior neck.  Ongoing for several days.  Now starting to hurt up into her right side of her head.  She has a hard time turning to the left.  No fevers or vision changes.  No weakness or numbness except for some numb feeling to the back of her head.  She states she had a couple times got vertigo when she stands up too quick but this is not necessarily a new thing.  Pain is moderate right now, 5 out of 10.  Has been taking hydrocodone for pain.  Past Medical History:  Diagnosis Date  . Allergic rhinitis   . 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 Anxiety and depression    started with PPD  . Chronic neck pain    Myofascial pain syndrome per [place] [facility], oxycodone per their office pain med contract.  (Normal cervical MRI 2010 per ortho notes).  . Dietary iron deficiency without anemia   . Easy bruisability 2011   Hx of: saw Hematologist (Dr. Cyndie Chime) and w/u neg for von Willebrand desease.  Marland Kitchen GERD (gastroesophageal reflux disease)    hx gastric ulcer and upper GI bleed, ? NSAID induced  . History of PCR DNA positive for HSV1   . History of PCR DNA positive for HSV2   . History of stomach ulcers   . Hypertension   . Iron deficiency 10/2019   WITHOUT anemia-suspect malabsorption. Pt to start FeSO4 325 qd 10/28/19  . Irregular menses 03/2018   GYN started Femynor 0.25-0.35.  [facility] as of 10/2018.  u/s wnl.  Endom bx NEG. OCPs helped.  . 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 Medial epicondylitis of elbow, left 12/2019   [facility]  . Migraine   . Osteoarthritis of carpometacarpal [facility]) joint of right thumb  12/2019   [facility]  . Reactive hypoglycemia    Dr. Everardo All  . Seizure (HCC) 2015   s/p eval with neuro, no medication, no events since  . Syncope    with ? seizure in 2015, eval with neruo  . Vitamin B12 deficiency   . Vitamin D deficiency     Patient Active Problem List   Diagnosis Date Noted  . Pain of right thumb 12/07/2019  . Pain, elbow joint 12/07/2019  . MDD (major depressive disorder), recurrent episode, moderate (HCC) 08/19/2019  . Allergic rhinitis 04/20/2019  . Vaginal irritation 05/06/2018  . Menorrhagia 04/03/2018  . Long term (current) use of opiate analgesic 10/31/2017  . 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 Altered mental status 04/11/2016  . Hypoglycemia 03/21/2016  . Encephalopathy acute 03/21/2016  . Vaginal discharge 02/13/2016  . Visit for preventive health examination 11/24/2015  . Vitamin B12 deficiency 06/05/2015  . Vitamin D deficiency 04/30/2015  . Chronic neck pain 04/11/2015  . Esophageal reflux 04/11/2015  . Obesity 04/11/2015  . Anxiety and depression 04/11/2015  . Migraine   . History of bariatric surgery 12/19/2001    Past Surgical History:  Procedure Laterality Date  . CESAREAN SECTION  2006  . ELBOW X-RAY Left 12/07/2019  . ENDOMETRIAL BIOPSY    .  EYE SURGERY Bilateral    2001  . FINGER X-RAY Right 12/07/2019  . 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 TONSILLECTOMY AND ADENOIDECTOMY    . TUBAL LIGATION  2006  . tubes in ears both Bilateral      OB History   No obstetric history on file.     Family History  Problem Relation Age of Onset  . Arthritis Mother   . Hyperlipidemia Mother   . Heart disease Mother   . Diabetes Mother   . Asthma Mother   . Aneurysm Father        deceased  . Lung cancer Maternal Aunt        smoker  . Colon cancer Maternal Grandmother   . Lung cancer Maternal Aunt        smoker    Social History   Tobacco Use  . Smoking status: Never Smoker  . Smokeless tobacco: Never Used   Vaping Use  . Vaping Use: Never used  Substance Use Topics  . Alcohol use: Yes    Alcohol/week: 0.0 standard drinks    Comment: occasional   . Drug use: No    Home Medications Prior to Admission medications   Medication Sig Start Date End Date Taking? Authorizing Provider  Alcohol Swabs (ALCOHOL WIPES) 70 % PADS Use to clean finger before checking blood sugar, checks blood sugar 3-4 times daily 02/12/17   McGowen, Maryjean Morn, MD  ALPRAZolam Prudy Feeler) 1 MG tablet TAKE 2 TABLETS BY MOUTH IN THE MORNING & 2 TABLETS AT BEDTIME 10/31/20 04/29/21  McGowen, Maryjean Morn, MD  Calcium 250 MG CAPS Take 1 capsule by mouth daily.     [provider]  cyanocobalamin (,VITAMIN B-12,) 1000 MCG/ML injection INJECT 1 ML INTO THE MUSCLE EVERY 2 WEEKS 11/24/20 11/24/21  McGowen, Maryjean Morn, MD  diclofenac Sodium (VOLTAREN) 1 % GEL Apply 1 application topically 4 (four) times daily. 12/08/19   [provider]  doxepin (SINEQUAN) 10 MG capsule Take 1-2 capsules (10-20 mg total) by mouth at bedtime. 02/20/21   Arfeen, Phillips Grout, MD  DULoxetine (CYMBALTA) 20 MG capsule Take 1 capsule (20 mg total) by mouth 2 (two) times daily. 02/20/21   Arfeen, Phillips Grout, MD  FEMYNOR 0.25-35 MG-MCG tablet TAKE 1 TABLET BY MOUTH ONCE DAILY. 07/31/20 07/31/21  McGowen, Maryjean Morn, MD  ferrous gluconate (FERGON) 324 MG tablet TAKE 1 TABLET (324 MG TOTAL) BY MOUTH DAILY WITH BREAKFAST. 01/04/21 01/04/22  McGowen, Maryjean Morn, MD  fluticasone (FLONASE) 50 MCG/ACT nasal spray PLACE 2 SPRAYS INTO BOTH NOSTRILS DAILY. 12/08/19   McGowen, Maryjean Morn, MD  gabapentin (NEURONTIN) 100 MG capsule TAKE 1 CAPSULE BY MOUTH THREE TIMES DAILY. 12/06/20 12/06/21  Su Hoff, PA-C  glucose blood (FREESTYLE LITE) test strip USE TO CHECK BLOOD SUGAR 3 TIMES A DAY 06/06/20   McGowen, Maryjean Morn, MD  hydrochlorothiazide (HYDRODIURIL) 25 MG tablet Take 1 tablet (25 mg total) by mouth daily. 02/27/21   McGowen, Maryjean Morn, MD  lamoTRIgine (LAMICTAL) 150 MG tablet Take 1 tablet  (150 mg total) by mouth daily. 02/20/21   Arfeen, Phillips Grout, MD  lisinopril (ZESTRIL) 10 MG tablet Take 1 tablet (10 mg total) by mouth daily. 02/20/21   McGowen, Maryjean Morn, MD  methocarbamol (ROBAXIN) 500 MG tablet  11/02/20   [provider]  metoprolol succinate (TOPROL-XL) 100 MG 24 hr tablet TAKE 1 TABLET (100 MG TOTAL) BY MOUTH DAILY. TAKE WITH OR IMMEDIATELY FOLLOWING A MEAL. 01/04/21 01/04/22  Nicoletta Ba  H, MD  montelukast (SINGULAIR) 10 MG tablet Take 1 tablet (10 mg total) by mouth at bedtime. 01/26/20   McGowen, Maryjean MornPhilip H, MD  norgestimate-ethinyl estradiol (ORTHO-CYCLEN) 0.25-35 MG-MCG tablet TAKE 1 TABLET BY MOUTH ONCE DAILY. 09/22/20 09/22/21  Henreitta LeberPowell, [place], PA-C  omeprazole (PRILOSEC) 40 MG capsule TAKE 1 CAPSULE BY MOUTH TWO TIMES DAILY 06/22/20 06/22/21  Jeoffrey MassedMcGowen, Philip H, MD  oxyCODONE-acetaminophen (PERCOCET) 10-325 MG tablet TK 1 T PO QID PRN 05/31/19   [provider]  potassium chloride SA (K-DUR) 20 MEQ tablet TAKE 1 TABLET BY MOUTH DAILY. 04/05/19   McGowen, Maryjean MornPhilip H, MD  QUEtiapine (SEROQUEL) 100 MG tablet Take 1 tablet (100 mg total) by mouth at bedtime as needed for insomnia Patient not taking: Reported on 02/20/2021 01/17/21   Jeoffrey MassedMcGowen, Philip H, MD  rizatriptan (MAXALT-MLT) 10 MG disintegrating tablet 1 tab po qd as needed for migraine headache.  May repeat dose in 2 hours if needed.  Max dose in 24h is 20 mg. Patient taking differently: 1 tab po qd as needed for migraine headache.  May repeat dose in 2 hours if needed.  Max dose in 24h is 20 mg. 04/01/19   McGowen, Maryjean MornPhilip H, MD  Vitamin D, Ergocalciferol, (DRISDOL) 1.25 MG (50000 UNIT) CAPS capsule TAKE 1 CAPSULE BY MOUTH TWICE WEEKLY 05/18/20 05/18/21  McGowen, Maryjean MornPhilip H, MD    Allergies    Ibuprofen  Review of Systems   Review of Systems  Constitutional: Negative for fever.  Eyes: Negative for visual disturbance.  Musculoskeletal: Positive for neck pain.  Neurological: Positive for dizziness, numbness and  headaches. Negative for weakness.  All other systems reviewed and are negative.   Physical Exam Updated Vital Signs BP 122/62   Pulse (!) 55   Temp 98.9 F (37.2 C) (Oral)   Resp 18   Ht 5\' 6"  (1.676 m)   Wt 125.2 kg   LMP 02/02/2021   SpO2 100%   BMI 44.55 kg/m   Physical Exam Vitals and nursing note reviewed.  Constitutional:      Appearance: She is well-developed. She is obese.  HENT:     Head: Normocephalic and atraumatic.      Right Ear: External ear normal.     Left Ear: External ear normal.     Nose: Nose normal.  Eyes:     General:        Right eye: No discharge.        Left eye: No discharge.     Extraocular Movements: Extraocular movements intact.     Pupils: Pupils are equal, round, and reactive to light.  Neck:      Comments: Slightly decreased ROM to left Cardiovascular:     Rate and Rhythm: Normal rate and regular rhythm.     Heart sounds: Normal heart sounds.  Pulmonary:     Effort: Pulmonary effort is normal.     Breath sounds: Normal breath sounds.  Abdominal:     Palpations: Abdomen is soft.     Tenderness: There is no abdominal tenderness.  Musculoskeletal:     Cervical back: Neck supple. No rigidity. Muscular tenderness present. No spinous process tenderness.  Skin:    General: Skin is warm and dry.  Neurological:     Mental Status: She is alert.     Comments: CN 3-12 grossly intact. 5/5 strength in all 4 extremities. Grossly normal sensation. Normal finger to nose.   Psychiatric:        Mood and Affect: Mood is  not anxious.     ED Results / Procedures / Treatments   Labs (all labs ordered are listed, but only abnormal results are displayed) Labs Reviewed  BASIC METABOLIC PANEL - Abnormal; Notable for the following components:      Result Value   Calcium 8.3 (*)    All other components within normal limits  CBC WITH DIFFERENTIAL/PLATELET    EKG None  Radiology No results found.  Procedures Procedures   Medications  Ordered in ED Medications  sodium chloride 0.9 % bolus 1,000 mL (0 mLs Intravenous Stopped 03/04/21 1509)  methocarbamol (ROBAXIN) tablet 1,000 mg (1,000 mg Oral Given 03/04/21 1348)  iohexol (OMNIPAQUE) 350 MG/ML injection 75 mL (75 mLs Intravenous Contrast Given 03/04/21 1513)    ED Course  I have reviewed the triage vital signs and the nursing notes.  Pertinent labs & imaging results that were available during my care of the patient were reviewed by me and considered in my medical decision making (see chart for details).    MDM Rules/Calculators/A&P                          Given the location of pain and vague on and off dizziness, CTA will be obtained to help rule out vertebral artery dissection.  If this is benign I think she can be discharged home with some muscle relaxers for her otherwise likely to be muscular neck pain.  No meningismus, fever, elevated WBC to suggest this is CNS infection.  Care to Dr. Stevie Kern. Final Clinical Impression(s) / ED Diagnoses Final diagnoses:  None    Rx / DC Orders ED Discharge Orders    None       Pricilla Loveless, MD 03/04/21 [phone]

## 2021-03-04 NOTE — ED Provider Notes (Signed)
Signout note  45 year old lady with headache and neck pain.    4:00 PM Received patient in signout from Dr. Criss Alvine, plan to follow-up on CT imaging.  If negative then discharge.    6:00 PM CTA with mild irregularity of the right ICA at C1-C2 level without intimal flap pseudoaneurysm or significant stenosis.  Reassessed patient. Appears well but still having moderate HA. I discussed this finding with neurology, Dr. Selina Cooley.  She recommends ER to ER transfer to [facility] for MR angio head and neck with contrast to further evaluate.  If this imaging is reassuring then patient can be potentially discharged home.  If any irregularity is also seen on MR imaging then she would recommend admission likely and formal neurologic consultation.  D/w [place] at [facility] ER. She accepts ER to ER. Patient is requesting transport POV, family member coming. Discussed risks/benefits of transport with [facility] vs POV and she has elected to go POV.   Milagros Loll, MD 03/04/21 [phone]

## 2021-03-05 MED ORDER — METHOCARBAMOL 500 MG PO TABS: 500.0000 mg | ORAL_TABLET | Freq: Two times a day (BID) | ORAL | 0 refills | Status: DC | PRN

## 2021-03-05 NOTE — Discharge Instructions (Signed)
We recommend Robaxin as prescribed in addition to over-the-counter Tylenol or Aleve.  You may also use over-the-counter magnesium cream and/or Lidoderm patches for pain control.  Follow up with your primary care doctor to ensure resolution of symptoms.

## 2021-03-05 NOTE — ED Notes (Signed)
E-signature pad unavailable at time of pt discharge. This RN discussed discharge materials with pt and answered all pt questions. Pt stated understanding of discharge material. ? ?

## 2021-03-06 ENCOUNTER — Ambulatory Visit: Payer: 59 | Admitting: Family Medicine

## 2021-03-06 ENCOUNTER — Ambulatory Visit: Payer: 59 | Admitting: Skilled Nursing Facility1

## 2021-03-07 NOTE — Telephone Encounter (Signed)
Spoke with pt and she will have this completed after appt tomorrow.

## 2021-03-08 ENCOUNTER — Ambulatory Visit: Payer: 59 | Admitting: Family Medicine

## 2021-03-08 DIAGNOSIS — Z0289 Encounter for other administrative examinations: Secondary | ICD-10-CM

## 2021-03-08 NOTE — Progress Notes (Deleted)
OFFICE VISIT  03/08/2021  CC: No chief complaint on file.  HPI:    Patient is a 45 y.o. female who presents for f/u ED visit 03/04/21 as well as f/u HTN. She presented there for right posterolateral neck pain with occipital HA and intermittent dizziness/vertigo.  I reviewed the entire encounter today including notes, imaging, and labs. Due to location of the pain plus vague on and off dizziness a CTA neck was done to r/o vertebral artery dissection.  This showed a "mild irregularity of the right ICA at the C1-C2 level without intimal flap, pseudoaneurysm, or significant stenosis to suggest vessel injury".  A MR angio head and neck was done to follow this up and it was normal (IMPRESSION: 1. Normal brain MRI for age. No acute intracranial abnormality or findings to explain patient's symptoms identified. 2. Normal intracranial MRA. 3. Normal MRA of the neck. Previously questioned irregularity about the distal cervical right ICA not seen on this exam.).  BMET and CBC normal.  Robaxin 1000 mg was given in the ED. Musculoskeletal neck pain was the final dx of ED provider.  HPI:   HTN:  Most recent med mgmt adjustment has been addition of lisinopril 10mg  qd on 02/20/21.  She has been on toprol xl 100mg  qd ***and hctz 25mg  qd. Home bp's: ***    PMP AWARE reviewed today: most recent rx for *** was filled ***, # ***, rx by ***. No red flags.   Past Medical History:  Diagnosis Date  . Allergic rhinitis   . 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 Anxiety and depression    started with PPD  . Chronic neck pain    Myofascial pain syndrome per [place] [facility], oxycodone per their office pain med contract.  (Normal cervical MRI 2010 per ortho notes).  . Dietary iron deficiency without anemia   . Easy bruisability 2011   Hx of: saw Hematologist (Dr. ) and w/u neg for von Willebrand desease.  04/2016 GERD (gastroesophageal reflux disease)    hx gastric ulcer and upper GI bleed, ?  NSAID induced  . History of PCR DNA positive for HSV1   . History of PCR DNA positive for HSV2   . History of stomach ulcers   . Hypertension   . Iron deficiency 10/2019   WITHOUT anemia-suspect malabsorption. Pt to start FeSO4 325 qd 10/28/19  . Irregular menses 03/2018   GYN started Femynor 0.25-0.35.  [facility] 10[facility].  u/s wnl.  Endom bx NEG. OCPs helped.  . 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 Medial epicondylitis of elbow, left 12/2019   [facility]  . Migraine   . Osteoarthritis of carpometacarpal [facility]) joint of right thumb 12/2019   [facility]  . Reactive hypoglycemia    Dr. 01/2020  . Seizure (HCC) 2015   s/p eval with neuro, no medication, no events since  . Syncope    with ? seizure in 2015, eval with neruo  . Vitamin B12 deficiency   . Vitamin D deficiency     Past Surgical History:  Procedure Laterality Date  . CESAREAN SECTION  2006  . ELBOW X-RAY Left 12/07/2019  . ENDOMETRIAL BIOPSY    . EYE SURGERY Bilateral    2001  . FINGER X-RAY Right 12/07/2019  . 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 TONSILLECTOMY AND ADENOIDECTOMY    . TUBAL LIGATION  2006  . tubes in ears both Bilateral     Outpatient Medications Prior  to Visit  Medication Sig Dispense Refill  . Alcohol Swabs (ALCOHOL WIPES) 70 % PADS Use to clean finger before checking blood sugar, checks blood sugar 3-4 times daily 100 each 11  . ALPRAZolam (XANAX) 1 MG tablet TAKE 2 TABLETS BY MOUTH IN THE MORNING & 2 TABLETS AT BEDTIME 120 tablet 5  . Calcium 250 MG CAPS Take 1 capsule by mouth daily.     . cyanocobalamin (,VITAMIN B-12,) 1000 MCG/ML injection INJECT 1 ML INTO THE MUSCLE EVERY 2 WEEKS 10 mL 6  . diclofenac Sodium (VOLTAREN) 1 % GEL Apply 1 application topically 4 (four) times daily.    Marland Kitchen doxepin (SINEQUAN) 10 MG capsule Take 1-2 capsules (10-20 mg total) by mouth at bedtime. 60 capsule 1  . DULoxetine (CYMBALTA) 20 MG capsule Take 1 capsule (20 mg total) by  mouth 2 (two) times daily. 180 capsule 0  . FEMYNOR 0.25-35 MG-MCG tablet TAKE 1 TABLET BY MOUTH ONCE DAILY. 84 tablet 3  . ferrous gluconate (FERGON) 324 MG tablet TAKE 1 TABLET (324 MG TOTAL) BY MOUTH DAILY WITH BREAKFAST. 90 tablet 3  . fluticasone (FLONASE) 50 MCG/ACT nasal spray PLACE 2 SPRAYS INTO BOTH NOSTRILS DAILY. 16 g 6  . gabapentin (NEURONTIN) 100 MG capsule TAKE 1 CAPSULE BY MOUTH THREE TIMES DAILY. 90 capsule 2  . glucose blood (FREESTYLE LITE) test strip USE TO CHECK BLOOD SUGAR 3 TIMES A DAY 200 strip 12  . hydrochlorothiazide (HYDRODIURIL) 25 MG tablet Take 1 tablet (25 mg total) by mouth daily. 30 tablet 1  . lamoTRIgine (LAMICTAL) 150 MG tablet Take 1 tablet (150 mg total) by mouth daily. 90 tablet 0  . lisinopril (ZESTRIL) 10 MG tablet Take 1 tablet (10 mg total) by mouth daily. 30 tablet 0  . methocarbamol (ROBAXIN) 500 MG tablet Take 1 tablet (500 mg total) by mouth every 12 (twelve) hours as needed for muscle spasms. 20 tablet 0  . metoprolol succinate (TOPROL-XL) 100 MG 24 hr tablet TAKE 1 TABLET (100 MG TOTAL) BY MOUTH DAILY. TAKE WITH OR IMMEDIATELY FOLLOWING A MEAL. 30 tablet 1  . montelukast (SINGULAIR) 10 MG tablet Take 1 tablet (10 mg total) by mouth at bedtime. 30 tablet 5  . norgestimate-ethinyl estradiol (ORTHO-CYCLEN) 0.25-35 MG-MCG tablet TAKE 1 TABLET BY MOUTH ONCE DAILY. 84 tablet 4  . omeprazole (PRILOSEC) 40 MG capsule TAKE 1 CAPSULE BY MOUTH TWO TIMES DAILY 180 capsule 3  . oxyCODONE-acetaminophen (PERCOCET) 10-325 MG tablet TK 1 T PO QID PRN    . potassium chloride SA (K-DUR) 20 MEQ tablet TAKE 1 TABLET BY MOUTH DAILY. 90 tablet 1  . QUEtiapine (SEROQUEL) 100 MG tablet Take 1 tablet (100 mg total) by mouth at bedtime as needed for insomnia (Patient not taking: Reported on 02/20/2021) 30 tablet 1  . rizatriptan (MAXALT-MLT) 10 MG disintegrating tablet 1 tab po qd as needed for migraine headache.  May repeat dose in 2 hours if needed.  Max dose in 24h is 20 mg.  (Patient taking differently: 1 tab po qd as needed for migraine headache.  May repeat dose in 2 hours if needed.  Max dose in 24h is 20 mg.) 10 tablet 5  . Vitamin D, Ergocalciferol, (DRISDOL) 1.25 MG (50000 UNIT) CAPS capsule TAKE 1 CAPSULE BY MOUTH TWICE WEEKLY 24 capsule 3   No facility-administered medications prior to visit.    Allergies  Allergen Reactions  . Ibuprofen     Causes ulcers    ROS As per HPI  PE:  Vitals with BMI 03/04/2021 03/04/2021 03/04/2021  Height - - -  Weight - - -  BMI - - -  Systolic 129 91 117  Diastolic 92 49 56  Pulse 55 57 60  Some encounter information is confidential and restricted. Go to Review Flowsheets activity to see all data.     ***  LABS:    Chemistry      Component Value Date/Time   NA 139 03/04/2021 1334   K 4.3 03/04/2021 1334   CL 107 03/04/2021 1334   CO2 23 03/04/2021 1334   BUN 14 03/04/2021 1334   CREATININE 0.72 03/04/2021 1334      Component Value Date/Time   CALCIUM 8.3 (L) 03/04/2021 1334   ALKPHOS 66 11/24/2020 0927   AST 11 11/24/2020 0927   ALT 5 11/24/2020 0927   BILITOT 0.3 11/24/2020 0927     Lab Results  Component Value Date   WBC 6.4 03/04/2021   HGB 12.2 03/04/2021   HCT 38.1 03/04/2021   MCV 91.8 03/04/2021   PLT 332 03/04/2021   Lab Results  Component Value Date   TSH 2.15 11/24/2020   Lab Results  Component Value Date   HGBA1C 4.6 08/07/2017   Lab Results  Component Value Date   VITAMINB12 766 11/24/2020   Lab Results  Component Value Date   IRON 74 11/24/2020   TIBC 445 11/24/2020   FERRITIN 11 (L) 11/24/2020   IMPRESSION AND PLAN:  No problem-specific Assessment & Plan notes found for this encounter.   An After Visit Summary was printed and given to the patient.  FOLLOW UP: No follow-ups on file.  Signed:  Santiago Bumpers, MD           03/08/2021

## 2021-03-12 ENCOUNTER — Other Ambulatory Visit (HOSPITAL_COMMUNITY): Payer: Self-pay

## 2021-03-14 DIAGNOSIS — Z79891 Long term (current) use of opiate analgesic: Secondary | ICD-10-CM | POA: Diagnosis not present

## 2021-03-14 DIAGNOSIS — G894 Chronic pain syndrome: Secondary | ICD-10-CM | POA: Diagnosis not present

## 2021-03-14 DIAGNOSIS — M542 Cervicalgia: Secondary | ICD-10-CM | POA: Diagnosis not present

## 2021-03-14 DIAGNOSIS — Z79899 Other long term (current) drug therapy: Secondary | ICD-10-CM | POA: Diagnosis not present

## 2021-03-14 DIAGNOSIS — Z5181 Encounter for therapeutic drug level monitoring: Secondary | ICD-10-CM | POA: Diagnosis not present

## 2021-03-16 ENCOUNTER — Ambulatory Visit: Payer: 59 | Admitting: Family Medicine

## 2021-03-22 ENCOUNTER — Other Ambulatory Visit (HOSPITAL_COMMUNITY): Payer: Self-pay

## 2021-03-22 MED FILL — Alprazolam Tab 1 MG: ORAL | 30 days supply | Qty: 120 | Fill #2 | Status: CN

## 2021-03-26 ENCOUNTER — Other Ambulatory Visit (HOSPITAL_COMMUNITY): Payer: Self-pay

## 2021-03-26 ENCOUNTER — Other Ambulatory Visit: Payer: Self-pay | Admitting: Family Medicine

## 2021-03-26 MED ORDER — LISINOPRIL 10 MG PO TABS
10.0000 mg | ORAL_TABLET | Freq: Every day | ORAL | 0 refills | Status: DC
  Filled 2021-03-26: qty 30, 30d supply, fill #0

## 2021-03-26 MED FILL — Metoprolol Succinate Tab ER 24HR 100 MG (Tartrate Equiv): ORAL | 30 days supply | Qty: 30 | Fill #1 | Status: AC

## 2021-03-26 MED FILL — Alprazolam Tab 1 MG: ORAL | 30 days supply | Qty: 120 | Fill #2 | Status: AC

## 2021-03-30 ENCOUNTER — Other Ambulatory Visit (HOSPITAL_COMMUNITY): Payer: Self-pay

## 2021-03-30 ENCOUNTER — Other Ambulatory Visit: Payer: Self-pay

## 2021-03-30 ENCOUNTER — Ambulatory Visit: Payer: 59 | Admitting: Family Medicine

## 2021-03-30 ENCOUNTER — Encounter: Payer: Self-pay | Admitting: Family Medicine

## 2021-03-30 VITALS — BP 121/81 | HR 71 | Temp 97.8°F | Resp 16 | Ht 64.5 in | Wt 265.2 lb

## 2021-03-30 DIAGNOSIS — I10 Essential (primary) hypertension: Secondary | ICD-10-CM

## 2021-03-30 DIAGNOSIS — M62838 Other muscle spasm: Secondary | ICD-10-CM | POA: Diagnosis not present

## 2021-03-30 MED ORDER — LISINOPRIL 20 MG PO TABS
20.0000 mg | ORAL_TABLET | Freq: Every day | ORAL | 0 refills | Status: DC
  Filled 2021-03-30: qty 30, 30d supply, fill #0

## 2021-03-30 NOTE — Progress Notes (Signed)
OFFICE VISIT  03/30/2021  CC:  Chief Complaint  Patient presents with   Follow-up    ED; hypertension   HPI:    Patient is a 45 y.o. female with hx of chronic neck pain who presents for ED f/u for acute neck pain on 03/04/21 (26 days ago->[facility]) and f/u HTN. Reviewed encounter notes, labs, and imaging.  Presented with right posterolateral neck pain and a couple episodes of vertigo.   CBC and BMET normal. CT angio neck was done to r/o vertebral artery dissection-->question of R ICA in distal cervical region.  MRA head and neck were done to follow this up/clarify and it was completely normal. Pt was d/c'd home with rx for robaxin.  INTERIM HX: Neck is feeling signif better, in fact pretty much back to her baseline.   HTN: Diastolics still up on home bp monitoring after last visit about 2 mo ago so I added lisinopril 10mg  qd (02/20/21). Home reading show systolics 120s/low 130s but diastolics consistently 90-100, HRs 60s-70s.  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  (Normal cervical MRI 2010 per ortho notes).   Dietary iron deficiency without anemia    Easy bruisability 2011   Hx of: saw Hematologist (Dr. 2012) and w/u neg for von Willebrand desease.   GERD (gastroesophageal reflux disease)    hx gastric ulcer and upper GI bleed, ? 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 Pt to start FeSO4 325 qd 10/28/19   Irregular menses 03/2018   GYN started [facility] 0.25-0.35.  [facility] as of 10/2018.  u/s wnl.  Endom bx NEG. OCPs helped.   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 01/2020   Seizure [facility]) 2015   s/p eval with neuro, no medication, no events since   Syncope    with ? 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      cyanocobalamin (,  VITAMIN B-12,) 1000 MCG/ML injection INJECT 1 ML INTO THE MUSCLE EVERY 2 WEEKS 10 mL 6   diclofenac Sodium (VOLTAREN) 1 % GEL Apply 1 application topically 4 (four) times daily.     doxepin (SINEQUAN) 10 MG capsule Take 1-2 capsules (10-20 mg total) by mouth at bedtime. 60 capsule 1   DULoxetine (CYMBALTA) 20 MG capsule Take 1 capsule (20 mg total) by mouth 2 (two) times daily. 180 capsule 0   FEMYNOR 0.25-35 MG-MCG tablet TAKE 1 TABLET BY MOUTH ONCE DAILY. 84 tablet 3   ferrous gluconate (FERGON) 324 MG tablet TAKE 1 TABLET (324 MG TOTAL) BY MOUTH DAILY WITH BREAKFAST. 90 tablet 3   fluticasone (FLONASE) 50 MCG/ACT nasal spray PLACE 2 SPRAYS INTO BOTH NOSTRILS DAILY. 16 g 6   gabapentin (NEURONTIN) 100 MG capsule TAKE 1 CAPSULE BY MOUTH THREE TIMES DAILY. 90 capsule 2   glucose blood  (FREESTYLE LITE) test strip USE TO CHECK BLOOD SUGAR 3 TIMES A DAY 200 strip 12   hydrochlorothiazide (HYDRODIURIL) 25 MG tablet Take 1 tablet (25 mg total) by mouth daily. 30 tablet 1   lamoTRIgine (LAMICTAL) 150 MG tablet Take 1 tablet (150 mg total) by mouth daily. 90 tablet 0   methocarbamol (ROBAXIN) 500 MG tablet Take 1 tablet (500 mg total) by mouth every 12 (twelve) hours as needed for muscle spasms. 20 tablet 0   metoprolol succinate (TOPROL-XL) 100 MG 24 hr tablet TAKE 1 TABLET (100 MG TOTAL) BY MOUTH DAILY. TAKE WITH OR IMMEDIATELY FOLLOWING A MEAL. 30 tablet 1   montelukast (SINGULAIR) 10 MG tablet Take 1 tablet (10 mg total) by mouth at bedtime. 30 tablet 5   norgestimate-ethinyl estradiol (ORTHO-CYCLEN) 0.25-35 MG-MCG tablet TAKE 1 TABLET BY MOUTH ONCE DAILY. 84 tablet 4   omeprazole (PRILOSEC) 40 MG capsule TAKE 1 CAPSULE BY MOUTH TWO TIMES DAILY 180 capsule 3   oxyCODONE-acetaminophen (PERCOCET) 10-325 MG tablet TK 1 T PO QID PRN     potassium chloride SA (K-DUR) 20 MEQ tablet TAKE 1 TABLET BY MOUTH DAILY. 90 tablet 1   QUEtiapine (SEROQUEL) 100 MG tablet Take 1 tablet (100 mg total) by mouth at bedtime as needed for insomnia 30 tablet 1   rizatriptan (MAXALT-MLT) 10 MG disintegrating tablet 1 tab po qd as needed for migraine headache.  May repeat dose in 2 hours if needed.  Max dose in 24h is 20 mg. (Patient taking differently: 1 tab po qd as needed for migraine headache.  May repeat dose in 2 hours if needed.  Max dose in 24h is 20 mg.) 10 tablet 5   Vitamin D, Ergocalciferol, (DRISDOL) 1.25 MG (50000 UNIT) CAPS capsule TAKE 1 CAPSULE BY MOUTH TWICE WEEKLY 24 capsule 3   lisinopril (ZESTRIL) 10 MG tablet Take 1 tablet (10 mg total) by mouth daily. 30 tablet 0   No facility-administered medications prior to visit.    Allergies  Allergen Reactions   Ibuprofen     Causes ulcers    ROS As per HPI  PE: Vitals with BMI 03/30/2021 03/04/2021 03/04/2021  Height 5' 4.5" - -   Weight 265 lbs 3 oz - -  BMI 44.83 - -  Systolic 121 129 91  Diastolic 81 92 49  Pulse 71 55 57  Some encounter information is confidential and restricted. Go to Review Flowsheets activity to see all data.   Gen: Alert, well appearing.  Patient is oriented to person, place, time, and situation. AFFECT: pleasant, lucid thought and speech. No  further exam today.  LABS:    Chemistry      Component Value Date/Time   NA 139 03/04/2021 1334   K 4.3 03/04/2021 1334   CL 107 03/04/2021 1334   CO2 23 03/04/2021 1334   BUN 14 03/04/2021 1334   CREATININE 0.72 03/04/2021 1334      Component Value Date/Time   CALCIUM 8.3 (L) 03/04/2021 1334   ALKPHOS 66 11/24/2020 0927   AST 11 11/24/2020 0927   ALT 5 11/24/2020 0927   BILITOT 0.3 11/24/2020 0927     Lab Results  Component Value Date   WBC 6.4 03/04/2021   HGB 12.2 03/04/2021   HCT 38.1 03/04/2021   MCV 91.8 03/04/2021   PLT 332 03/04/2021   Lab Results  Component Value Date   HGBA1C 4.6 08/07/2017   IMPRESSION AND PLAN:  1) Uncontrolled HTN (diastolic for the most part). Inc lisin to 20mg  qd.  Cont hctz 25mg  qd and toprol xl 100 qd. Lytes/cr were normal in the ED 12 days after I added lisinopril so I don't need to repeat this today. Cont home bp/hr monitoring. Lytes/cr recheck at next f/u in 3-4 wks.  2) Neck muscle strain/pain->resolved.  An After Visit Summary was printed and given to the patient.  FOLLOW UP: Return for 3-4 wk f/u HTN.  Signed:  , MD           03/30/2021

## 2021-04-02 ENCOUNTER — Encounter: Payer: 59 | Attending: Family Medicine | Admitting: Skilled Nursing Facility1

## 2021-04-02 ENCOUNTER — Other Ambulatory Visit (HOSPITAL_COMMUNITY): Payer: Self-pay

## 2021-04-02 ENCOUNTER — Other Ambulatory Visit: Payer: Self-pay

## 2021-04-02 DIAGNOSIS — E669 Obesity, unspecified: Secondary | ICD-10-CM | POA: Diagnosis not present

## 2021-04-02 NOTE — Progress Notes (Signed)
Medical Nutrition Therapy  Appointment Start time:  7:33  Appointment End time:  8:35  Primary concerns today: Hypoglycemia  Referral diagnosis: k90.8 Preferred learning style: visual Learning readiness: ready   NUTRITION ASSESSMENT    Clinical Medical Hx: GERD, possible reactive hypoglycemia, RYGB 2002, HTN, anxiety/depression, bleeding disorder Medications: see list: increased blood pressure medications  Labs: b12 766, vitamin D 41.07, ferritin 11, Triglycerides 329 Notable Signs/Symptoms: migraines   Lifestyle & Dietary Hx  Pt states she has had a couple low since her last visit stating 72 and 74 feeling drained. Pt states she went to the ER with a  low calcium. Pt state she got a CT and MRI finding a muscle spasm in her neck. Pt state she does check her blood sugars daily.    Estimated daily fluid intake: 64 oz Supplements: calcium 3 a day, b12 injection every 2 weeks, potassium, vitamin d 56979 once weekly, bariatric advantage multivitamin   Sleep: light sleeper with a loud roommate so not great, 5-6 hours not restful  Stress / self-care: cymbalta has really been helping Current average weekly physical activity: ADL's  Body Composition Scale 02/22/2021 04/02/2021  Current Body Weight 265.6 265.3  Total Body Fat % 45.6 45.6  Visceral Fat  16  Fat-Free Mass %  54.3   Total Body Water % 41.6 41.6  Muscle-Mass lbs  33.4  BMI  42.8  Body Fat Displacement           Torso  lbs  75.1         Left Leg  lbs  15         Right Leg  lbs  15         Left Arm  lbs  7.5         Right Arm   lbs  7.5    24-Hr Dietary Recall First Meal: coffee + premier protein + sugar + greek yogurt  Snack: baby bell cheese and crackers hypoglycemia snack 2 packs of crackers Second Meal: soup or frozen meal Snack: crackers or fruit Third Meal: chicken tenders on salad with ranch Snack:  Beverages: water, coffee + premier protein + sugar  Estimated Energy Needs Calories:  1300-1500   NUTRITION INTERVENTION: continued  Nutrition education (E-1) on the following topics:  Micronutrient deficiencies   Reactive hypoglycemia  Creation of balanced meals Ulceration of RYGB pouch Apropriate follow up with her bariatric surgeon  NEW explanation of body composition scale   Handouts Previously Provided Include  Vitamin/mineral sheet Meals ideas sheet  Learning Style & Readiness for Change Teaching method utilized: Visual & Auditory  Demonstrated degree of understanding via: Teach Back  Barriers to learning/adherence to lifestyle change: none identified   Goals Established by Pt Previously  Avoid NSAIDs completely: Aleieve, ibuprofen, advil, etc.  Get in with a bariatric surgeon-[facility] or [facility] or [facility] or [facility] etc. Management consultant in your coffee Keep up eating every 3 hours   Snacks: 1 small piece of fruit + 1/4 cup nuts or 1 small piece of fruit + cheese or handful [place] or greek yogurt or 1 cheese stick or any non starchy vegetable  If you do have a low blood sugar have 4 ounces 100% fruit juice and then once up a meal with complex carb + protein  Be sure to have non starchy vegetables with lunch and dinner  Start taking the appropriate multivitamin and 3 calcium every at least 2 hours apart including  from your iron supplement  NEW: cut out the sugar from your coffee    MONITORING & EVALUATION Dietary intake, weekly physical activity  Next Steps  Patient is to call or e-mail if needed

## 2021-04-10 ENCOUNTER — Other Ambulatory Visit (HOSPITAL_COMMUNITY): Payer: Self-pay

## 2021-04-10 ENCOUNTER — Other Ambulatory Visit: Payer: Self-pay

## 2021-04-10 MED ORDER — METRONIDAZOLE 500 MG PO TABS
500.0000 mg | ORAL_TABLET | Freq: Two times a day (BID) | ORAL | 0 refills | Status: AC
  Filled 2021-04-10: qty 14, 7d supply, fill #0

## 2021-04-11 ENCOUNTER — Other Ambulatory Visit: Payer: Self-pay

## 2021-04-11 ENCOUNTER — Other Ambulatory Visit (HOSPITAL_COMMUNITY): Payer: Self-pay

## 2021-04-16 ENCOUNTER — Other Ambulatory Visit (HOSPITAL_COMMUNITY): Payer: Self-pay

## 2021-04-17 ENCOUNTER — Other Ambulatory Visit (HOSPITAL_COMMUNITY): Payer: Self-pay

## 2021-04-17 ENCOUNTER — Other Ambulatory Visit: Payer: Self-pay

## 2021-04-17 MED FILL — Norgestimate & Ethinyl Estradiol Tab 0.25 MG-35 MCG: ORAL | 84 days supply | Qty: 84 | Fill #1 | Status: AC

## 2021-04-18 ENCOUNTER — Encounter: Payer: Self-pay | Admitting: Family Medicine

## 2021-04-18 ENCOUNTER — Other Ambulatory Visit (HOSPITAL_COMMUNITY): Payer: Self-pay

## 2021-04-18 ENCOUNTER — Other Ambulatory Visit: Payer: Self-pay

## 2021-04-19 ENCOUNTER — Other Ambulatory Visit (HOSPITAL_COMMUNITY): Payer: Self-pay

## 2021-04-19 DIAGNOSIS — R309 Painful micturition, unspecified: Secondary | ICD-10-CM | POA: Diagnosis not present

## 2021-04-19 DIAGNOSIS — N898 Other specified noninflammatory disorders of vagina: Secondary | ICD-10-CM | POA: Diagnosis not present

## 2021-04-19 DIAGNOSIS — Z113 Encounter for screening for infections with a predominantly sexual mode of transmission: Secondary | ICD-10-CM | POA: Diagnosis not present

## 2021-04-19 MED ORDER — NITROFURANTOIN MONOHYD MACRO 100 MG PO CAPS
100.0000 mg | ORAL_CAPSULE | Freq: Two times a day (BID) | ORAL | 0 refills | Status: DC
  Filled 2021-04-19: qty 10, 5d supply, fill #0

## 2021-04-20 ENCOUNTER — Other Ambulatory Visit (HOSPITAL_COMMUNITY): Payer: Self-pay

## 2021-04-20 ENCOUNTER — Other Ambulatory Visit: Payer: Self-pay

## 2021-04-23 ENCOUNTER — Other Ambulatory Visit (HOSPITAL_COMMUNITY): Payer: Self-pay

## 2021-04-23 MED ORDER — NYSTATIN-TRIAMCINOLONE 100000-0.1 UNIT/GM-% EX OINT
TOPICAL_OINTMENT | Freq: Two times a day (BID) | CUTANEOUS | 0 refills | Status: DC | PRN
  Filled 2021-04-23: qty 15, 10d supply, fill #0
  Filled 2021-04-24: qty 30, 14d supply, fill #0
  Filled 2022-03-16: qty 30, 12d supply, fill #0

## 2021-04-24 ENCOUNTER — Encounter: Payer: Self-pay | Admitting: Family Medicine

## 2021-04-24 ENCOUNTER — Other Ambulatory Visit (HOSPITAL_COMMUNITY): Payer: Self-pay

## 2021-04-24 ENCOUNTER — Other Ambulatory Visit: Payer: Self-pay | Admitting: Family Medicine

## 2021-04-24 MED FILL — Ergocalciferol Cap 1.25 MG (50000 Unit): ORAL | 84 days supply | Qty: 24 | Fill #0 | Status: AC

## 2021-04-24 MED FILL — Cyanocobalamin Inj 1000 MCG/ML: INTRAMUSCULAR | 84 days supply | Qty: 6 | Fill #1 | Status: AC

## 2021-04-25 ENCOUNTER — Other Ambulatory Visit (HOSPITAL_COMMUNITY): Payer: Self-pay

## 2021-04-25 ENCOUNTER — Other Ambulatory Visit: Payer: Self-pay | Admitting: Family Medicine

## 2021-04-25 ENCOUNTER — Encounter: Payer: Self-pay | Admitting: Family Medicine

## 2021-04-25 DIAGNOSIS — R309 Painful micturition, unspecified: Secondary | ICD-10-CM | POA: Diagnosis not present

## 2021-04-25 MED ORDER — ALPRAZOLAM 1 MG PO TABS
ORAL_TABLET | ORAL | 5 refills | Status: DC
  Filled 2021-04-25: qty 120, 30d supply, fill #0
  Filled 2021-05-24: qty 120, 30d supply, fill #1
  Filled 2021-06-25: qty 120, 30d supply, fill #2
  Filled 2021-07-24: qty 120, 30d supply, fill #3
  Filled 2021-08-21: qty 120, 30d supply, fill #4
  Filled 2021-09-18: qty 120, 30d supply, fill #5

## 2021-04-25 MED ORDER — HYDROCHLOROTHIAZIDE 25 MG PO TABS
25.0000 mg | ORAL_TABLET | Freq: Every day | ORAL | 0 refills | Status: DC
  Filled 2021-04-25: qty 30, 30d supply, fill #0

## 2021-04-25 MED ORDER — METOPROLOL SUCCINATE ER 100 MG PO TB24
100.0000 mg | ORAL_TABLET | Freq: Every day | ORAL | 0 refills | Status: DC
  Filled 2021-04-25: qty 30, 30d supply, fill #0

## 2021-04-25 NOTE — Telephone Encounter (Signed)
Patient called several times yesterday afternoon and again this morning, impolite and coarse each time, requesting immediate refills of alprazolam and hydrochlorothiazide. I have assured her every time that both hers and the pharmacy refill requests are visible in her chart. She asks why it cannot be done immediately, what else does the staff have to do? I have reminded her of the 24-48 hours time frame for refills but each time, she asks why it cannot be done immediately. I gently told her there are many varied requests from other patients and hers will be worked as soon as we can.  When appropriate, please send refills of alprazolam and hydrochlorothiazide to [facility] and advise patient when sent.

## 2021-04-25 NOTE — Telephone Encounter (Signed)
1 hour ago (9:45 AM)   I've been without my blood pressure meds for 2days. I did not realize there were no Dr's there yesterday.  Please refill the blood pressure meds and the xanax asap. Please let me know once completed

## 2021-04-25 NOTE — Telephone Encounter (Signed)
Patient called again, asking why HCTZ was filled and not Xanax. I relayed Toria's message and told her Dr. Milinda Cave may refill it when he has time working around all his other patients and messages. I explained refills are a process and that is why we ask up to 48 hours notice for refills. Reiana asked what the lunch schedules are for Dr. Milinda Cave and his assistants and I simply told her everyone should be coming back shortly. She sounded exasperated and said she has been out of these meds. I gently suggested next time she call for refills when she has 5 days left, instead of waiting until she is out. Patient voiced understanding.

## 2021-04-25 NOTE — Telephone Encounter (Signed)
18 hours ago (4:45 PM)   Do you know if these meds can be call in today.im tryin to pick them up before the pharmacy closes  Lynder Parents, Maryjean Morn, MD 20 hours ago (2:49 PM)   Will you please have Dr. Boyd Kerbs call in refills of my xanax and hydrochlorithiazide to the [facility] on [address]? Also let me know when complete

## 2021-04-25 NOTE — Telephone Encounter (Signed)
Submitted mychart message in this encounter to narrow the encounters open in chart.

## 2021-04-25 NOTE — Telephone Encounter (Signed)
Please see other encounter.

## 2021-04-25 NOTE — Telephone Encounter (Signed)
Rx is still pending not denied. Please see other encounter

## 2021-04-25 NOTE — Telephone Encounter (Signed)
Noted 3 mychart messages and refill request submitted to office< 24 hours.   BP meds fill please advise on controlled rx.   Requesting: alprazolam  Contract: 05/15/20 UDS: 11/24/20 Last Visit:03/30/21 Next Visit: 04/26/21 Last Refill: 10/31/20 #5 refills  Please Advise

## 2021-04-25 NOTE — Telephone Encounter (Signed)
See other encounter.

## 2021-04-26 ENCOUNTER — Ambulatory Visit (INDEPENDENT_AMBULATORY_CARE_PROVIDER_SITE_OTHER): Payer: 59 | Admitting: Family Medicine

## 2021-04-26 ENCOUNTER — Encounter: Payer: Self-pay | Admitting: Family Medicine

## 2021-04-26 ENCOUNTER — Observation Stay (HOSPITAL_BASED_OUTPATIENT_CLINIC_OR_DEPARTMENT_OTHER)
Admission: EM | Admit: 2021-04-26 | Discharge: 2021-04-27 | Disposition: A | Discharge status: Home / Self Care | Payer: 59 | Attending: Internal Medicine | Admitting: Internal Medicine

## 2021-04-26 ENCOUNTER — Encounter (HOSPITAL_BASED_OUTPATIENT_CLINIC_OR_DEPARTMENT_OTHER): Payer: Self-pay

## 2021-04-26 ENCOUNTER — Other Ambulatory Visit: Payer: Self-pay

## 2021-04-26 VITALS — BP 79/58 | HR 59 | Temp 97.5°F | Ht 64.5 in | Wt 270.0 lb

## 2021-04-26 DIAGNOSIS — Z20822 Contact with and (suspected) exposure to covid-19: Secondary | ICD-10-CM | POA: Diagnosis not present

## 2021-04-26 DIAGNOSIS — I959 Hypotension, unspecified: Secondary | ICD-10-CM | POA: Diagnosis not present

## 2021-04-26 DIAGNOSIS — I10 Essential (primary) hypertension: Secondary | ICD-10-CM | POA: Insufficient documentation

## 2021-04-26 DIAGNOSIS — E861 Hypovolemia: Secondary | ICD-10-CM

## 2021-04-26 DIAGNOSIS — E871 Hypo-osmolality and hyponatremia: Secondary | ICD-10-CM | POA: Diagnosis not present

## 2021-04-26 DIAGNOSIS — N179 Acute kidney failure, unspecified: Secondary | ICD-10-CM | POA: Diagnosis present

## 2021-04-26 DIAGNOSIS — I952 Hypotension due to drugs: Secondary | ICD-10-CM

## 2021-04-26 DIAGNOSIS — E66813 Obesity, class 3: Secondary | ICD-10-CM | POA: Diagnosis present

## 2021-04-26 DIAGNOSIS — E161 Other hypoglycemia: Secondary | ICD-10-CM

## 2021-04-26 DIAGNOSIS — E860 Dehydration: Secondary | ICD-10-CM

## 2021-04-26 DIAGNOSIS — R5383 Other fatigue: Secondary | ICD-10-CM

## 2021-04-26 DIAGNOSIS — R531 Weakness: Secondary | ICD-10-CM | POA: Diagnosis present

## 2021-04-26 DIAGNOSIS — Z79899 Other long term (current) drug therapy: Secondary | ICD-10-CM | POA: Insufficient documentation

## 2021-04-26 DIAGNOSIS — I9589 Other hypotension: Secondary | ICD-10-CM

## 2021-04-26 DIAGNOSIS — R001 Bradycardia, unspecified: Secondary | ICD-10-CM | POA: Diagnosis not present

## 2021-04-26 DIAGNOSIS — I951 Orthostatic hypotension: Secondary | ICD-10-CM | POA: Diagnosis present

## 2021-04-26 DIAGNOSIS — K219 Gastro-esophageal reflux disease without esophagitis: Secondary | ICD-10-CM | POA: Diagnosis present

## 2021-04-26 DIAGNOSIS — F39 Unspecified mood [affective] disorder: Secondary | ICD-10-CM | POA: Diagnosis present

## 2021-04-26 DIAGNOSIS — E669 Obesity, unspecified: Secondary | ICD-10-CM | POA: Diagnosis present

## 2021-04-26 HISTORY — DX: Acute kidney failure, unspecified: N17.9

## 2021-04-26 LAB — COMPREHENSIVE METABOLIC PANEL
ALT: <5 U/L (ref 0–44)
AST: 11 U/L — ABNORMAL LOW (ref 15–41)
Albumin: 3.9 g/dL (ref 3.5–5.0)
Alkaline Phosphatase: 51 U/L (ref 38–126)
Anion gap: 12 (ref 5–15)
BUN: 25 mg/dL — ABNORMAL HIGH (ref 6–20)
CO2: 19 mmol/L — ABNORMAL LOW (ref 22–32)
Calcium: 8.4 mg/dL — ABNORMAL LOW (ref 8.9–10.3)
Chloride: 101 mmol/L (ref 98–111)
Creatinine, Ser: 2.12 mg/dL — ABNORMAL HIGH (ref 0.44–1.00)
GFR, Estimated: 29 mL/min — ABNORMAL LOW (ref >60)
Glucose, Bld: 76 mg/dL (ref 70–99)
Potassium: 4.1 mmol/L (ref 3.5–5.1)
Sodium: 132 mmol/L — ABNORMAL LOW (ref 135–145)
Total Bilirubin: 0.4 mg/dL (ref 0.3–1.2)
Total Protein: 7 g/dL (ref 6.5–8.1)

## 2021-04-26 LAB — URINALYSIS, ROUTINE W REFLEX MICROSCOPIC
Bilirubin Urine: NEGATIVE
Glucose, UA: NEGATIVE mg/dL
Hgb urine dipstick: NEGATIVE
Ketones, ur: NEGATIVE mg/dL
Leukocytes,Ua: NEGATIVE
Nitrite: NEGATIVE
Specific Gravity, Urine: 1.031 — ABNORMAL HIGH (ref 1.005–1.030)
pH: 5 (ref 5.0–8.0)

## 2021-04-26 LAB — CBC
HCT: 43.1 % (ref 36.0–46.0)
Hemoglobin: 13.6 g/dL (ref 12.0–15.0)
MCH: 29.6 pg (ref 26.0–34.0)
MCHC: 31.6 g/dL (ref 30.0–36.0)
MCV: 93.9 fL (ref 80.0–100.0)
Platelets: 330 10*3/uL (ref 150–400)
RBC: 4.59 MIL/uL (ref 3.87–5.11)
RDW: 14.9 % (ref 11.5–15.5)
WBC: 9.2 10*3/uL (ref 4.0–10.5)
nRBC: 0 % (ref 0.0–0.2)

## 2021-04-26 LAB — CBG MONITORING, ED: Glucose-Capillary: 76 mg/dL (ref 70–99)

## 2021-04-26 LAB — RESP PANEL BY RT-PCR (FLU A&B, COVID) ARPGX2
Influenza A by PCR: NEGATIVE
Influenza B by PCR: NEGATIVE
SARS Coronavirus 2 by RT PCR: NEGATIVE

## 2021-04-26 MED ORDER — LACTATED RINGERS IV BOLUS
1000.0000 mL | Freq: Once | INTRAVENOUS | Status: AC
  Administered 2021-04-26: 1000 mL via INTRAVENOUS

## 2021-04-26 MED ORDER — SODIUM CHLORIDE 0.9 % IV BOLUS
1000.0000 mL | Freq: Once | INTRAVENOUS | Status: AC
  Administered 2021-04-26: 1000 mL via INTRAVENOUS

## 2021-04-26 MED ORDER — LACTATED RINGERS IV BOLUS: 1000.0000 mL | Freq: Once | INTRAVENOUS | Status: DC

## 2021-04-26 MED ORDER — ACETAMINOPHEN 500 MG PO TABS
1000.0000 mg | ORAL_TABLET | Freq: Once | ORAL | Status: AC
  Administered 2021-04-26: 1000 mg via ORAL
  Filled 2021-04-26: qty 2

## 2021-04-26 NOTE — ED Provider Notes (Addendum)
MEDCENTER [facility] EMERGENCY DEPT Provider Note   CSN: [id number] Arrival date & time: 04/26/21  1608     History Chief Complaint  Patient presents with   Hypotension    Toni [name] is a 45 y.o. female.  Patient with generalized weakness, mildly lightheaded. States was at pcp visit for routine f/u and to check her blood pressure as her blood pressure had been running high, so last month her bp meds were increased. States was told bp was low at office, and sent to ED.  Patient indicates already took her bp meds today (hctz, metoprolol, and lisinopril). Recently completed course of macrobid for possible uti. No current gu symptoms, no dysuria. Other than bp med change ~ 1 month ago, and recent macrobid, no other change in meds. Pt denies taking extra of her meds/bp meds. Denies headache. No chest pain or discomfort. No sob. No cough or uri symptoms. No fever or chills. No abd pain, no vomiting or diarrhea. No recent blood loss, vaginal bleeding, rectal bleeding or melena.   The history is provided by the patient and medical records.      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  (Normal cervical MRI 2010 per ortho notes).   Dietary iron deficiency without anemia    Easy bruisability 2011   Hx of: saw Hematologist (Dr. Cyndie Chime) and w/u neg for von Willebrand desease.   GERD (gastroesophageal reflux disease)    hx gastric ulcer and upper GI bleed, ? 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 Pt to start FeSO4 325 qd 10/28/19   Irregular menses 03/2018   GYN started [facility] 0.25-0.35.  [facility] as of 10/2018.  u/s wnl.  Endom bx NEG. OCPs helped.   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 Everardo All   Seizure [facility]) 2015   s/p eval with neuro, no medication, no events since   Syncope    with ? seizure in 2015, eval with neruo   Vitamin B12 deficiency    Vitamin D deficiency     Patient Active Problem List   Diagnosis Date Noted   Pain of right thumb 12/07/2019   Pain, elbow joint 12/07/2019   MDD (major depressive disorder), recurrent episode, moderate (HCC) 08/19/2019   Allergic rhinitis 04/20/2019   Vaginal irritation 05/06/2018   Menorrhagia 04/03/2018   Long term (current) use of opiate analgesic 10/31/2017   Insomnia secondary to anxiety 05/02/2016   Altered mental status 04/11/2016   Hypoglycemia 03/21/2016   Encephalopathy acute 03/21/2016   Vaginal discharge 02/13/2016   Visit for preventive health examination 11/24/2015   Vitamin B12 deficiency 06/05/2015   Vitamin D deficiency 04/30/2015   Chronic neck pain 04/11/2015   Esophageal reflux 04/11/2015   Obesity 04/11/2015   Anxiety and depression 04/11/2015   Migraine  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     Family History  Problem Relation Age of Onset   Arthritis Mother    Hyperlipidemia Mother    Heart disease Mother    Diabetes Mother    Asthma Mother    Aneurysm Father        deceased   Lung cancer Maternal  Aunt        smoker   Colon cancer Maternal Grandmother    Lung cancer Maternal Aunt        smoker    Social History   Tobacco Use   Smoking status: Never   Smokeless tobacco: Never  Vaping Use   Vaping Use: Never used  Substance Use Topics   Alcohol use: Yes    Alcohol/week: 0.0 standard drinks    Comment: occasional    Drug use: No    Home Medications Prior to Admission medications   Medication Sig Start Date End Date Taking? Authorizing Provider  hydrochlorothiazide (HYDRODIURIL) 25 MG tablet Take 1 tablet (25 mg total) by mouth daily. 04/25/21  Yes McGowen, Maryjean Morn, MD  lisinopril (ZESTRIL) 20 MG tablet Take 1 tablet (20 mg total) by mouth daily. 03/30/21  Yes McGowen, Maryjean Morn, MD  metoprolol succinate (TOPROL-XL) 100 MG 24 hr tablet Take 1 tablet (100 mg total) by mouth daily with or immediately following a meal 04/25/21  Yes McGowen, Maryjean Morn, MD  norgestimate-ethinyl estradiol (ORTHO-CYCLEN) 0.25-35 MG-MCG tablet TAKE 1 TABLET BY MOUTH ONCE DAILY. 09/22/20 09/22/21 Yes Henreitta Leber, PA-C  Alcohol Swabs (ALCOHOL WIPES) 70 % PADS Use to clean finger before checking blood sugar, checks blood sugar 3-4 times daily 02/12/17   McGowen, Maryjean Morn, MD  ALPRAZolam Prudy Feeler) 1 MG tablet TAKE 2 TABLETS BY MOUTH IN THE MORNING & 2 TABLETS AT BEDTIME 04/25/21   McGowen, Maryjean Morn, MD  Calcium 250 MG CAPS Take 1 capsule by mouth daily.     [provider]  cyanocobalamin (,VITAMIN B-12,) 1000 MCG/ML injection INJECT 1 ML INTO THE MUSCLE EVERY 2 WEEKS 11/24/20 11/24/21  McGowen, Maryjean Morn, MD  DULoxetine (CYMBALTA) 20 MG capsule Take 1 capsule (20 mg total) by mouth 2 (two) times daily. 02/20/21   Arfeen, Phillips Grout, MD  FEMYNOR 0.25-35 MG-MCG tablet TAKE 1 TABLET BY MOUTH ONCE DAILY. 07/31/20 07/31/21  McGowen, Maryjean Morn, MD  ferrous gluconate (FERGON) 324 MG tablet TAKE 1 TABLET (324 MG TOTAL) BY MOUTH DAILY WITH BREAKFAST. 01/04/21 01/04/22  McGowen, Maryjean Morn, MD  fluticasone (FLONASE) 50  MCG/ACT nasal spray PLACE 2 SPRAYS INTO BOTH NOSTRILS DAILY. 12/08/19   McGowen, Maryjean Morn, MD  gabapentin (NEURONTIN) 100 MG capsule TAKE 1 CAPSULE BY MOUTH THREE TIMES DAILY. 12/06/20 12/06/21  Su Hoff, PA-C  glucose blood (FREESTYLE LITE) test strip USE TO CHECK BLOOD SUGAR 3 TIMES A DAY 06/06/20   McGowen, Maryjean Morn, MD  lamoTRIgine (LAMICTAL) 150 MG tablet Take 1 tablet (150 mg total) by mouth daily. 02/20/21   Arfeen, Phillips Grout, MD  methocarbamol (ROBAXIN)  500 MG tablet Take 1 tablet (500 mg total) by mouth every 12 (twelve) hours as needed for muscle spasms. 03/05/21   Antony MaduraHumes, Kelly, PA-C  montelukast (SINGULAIR) 10 MG tablet Take 1 tablet (10 mg total) by mouth at bedtime. 01/26/20   McGowen, Maryjean MornPhilip H, MD  nitrofurantoin, macrocrystal-monohydrate, (MACROBID) 100 MG capsule Take 1 capsule (100 mg total) by mouth 2 (two) times daily after a meal for 5 days 04/19/21     nystatin-triamcinolone ointment (MYCOLOG) APPLY TO THE AFFECTED AREA(S) BY TOPICAL ROUTE 2 TIMES PER DAY AS NEEDED 04/23/21     omeprazole (PRILOSEC) 40 MG capsule TAKE 1 CAPSULE BY MOUTH TWO TIMES DAILY 06/22/20 06/22/21  Jeoffrey MassedMcGowen, Philip H, MD  oxyCODONE-acetaminophen (PERCOCET) 10-325 MG tablet TK 1 T PO QID PRN 05/31/19   [provider]  potassium chloride SA (K-DUR) 20 MEQ tablet TAKE 1 TABLET BY MOUTH DAILY. 04/05/19   McGowen, Maryjean MornPhilip H, MD  QUEtiapine (SEROQUEL) 100 MG tablet Take 1 tablet (100 mg total) by mouth at bedtime as needed for insomnia 01/17/21   McGowen, Maryjean MornPhilip H, MD  rizatriptan (MAXALT-MLT) 10 MG disintegrating tablet 1 tab po qd as needed for migraine headache.  May repeat dose in 2 hours if needed.  Max dose in 24h is 20 mg. Patient taking differently: 1 tab po qd as needed for migraine headache.  May repeat dose in 2 hours if needed.  Max dose in 24h is 20 mg. 04/01/19   McGowen, Maryjean MornPhilip H, MD  Vitamin D, Ergocalciferol, (DRISDOL) 1.25 MG (50000 UNIT) CAPS capsule TAKE 1 CAPSULE BY MOUTH TWICE WEEKLY 05/18/20 07/18/21   McGowen, Maryjean MornPhilip H, MD    Allergies    Ibuprofen  Review of Systems   Review of Systems  Constitutional:  Negative for chills and fever.  HENT:  Negative for nosebleeds.   Eyes:  Negative for redness and visual disturbance.  Respiratory:  Negative for cough and shortness of breath.   Cardiovascular:  Negative for chest pain, palpitations and leg swelling.  Gastrointestinal:  Negative for abdominal pain, blood in stool, diarrhea and vomiting.  Genitourinary:  Negative for dysuria, flank pain and vaginal bleeding.  Musculoskeletal:  Negative for back pain and neck pain.  Skin:  Negative for rash.  Neurological:  Positive for light-headedness. Negative for numbness and headaches.  Hematological:  Does not bruise/bleed easily.  Psychiatric/Behavioral:  Negative for confusion.    Physical Exam Updated Vital Signs BP (!) 87/51 (BP Location: Right Arm)   Pulse (!) 55   Temp 97.6 F (36.4 C) (Oral)   Resp 12   Ht 1.676 m (5\' 6" )   Wt 122.5 kg   LMP 03/29/2021 (Approximate)   SpO2 100%   BMI 43.58 kg/m   Physical Exam Vitals and nursing note reviewed.  Constitutional:      Appearance: Normal appearance. She is well-developed.  HENT:     Head: Atraumatic.     Nose: Nose normal.     Mouth/Throat:     Mouth: Mucous membranes are moist.  Eyes:     General: No scleral icterus.    Conjunctiva/sclera: Conjunctivae normal.     Pupils: Pupils are equal, round, and reactive to light.  Neck:     Trachea: No tracheal deviation.  Cardiovascular:     Rate and Rhythm: Normal rate and regular rhythm.     Pulses: Normal pulses.     Heart sounds: Normal heart sounds. No murmur heard.   No friction rub. No gallop.  Pulmonary:  Effort: Pulmonary effort is normal. No respiratory distress.     Breath sounds: Normal breath sounds.  Abdominal:     General: Bowel sounds are normal. There is no distension.     Palpations: Abdomen is soft. There is no mass.     Tenderness: There is no  abdominal tenderness. There is no guarding or rebound.     Hernia: No hernia is present.  Genitourinary:    Comments: No cva tenderness.  Musculoskeletal:        General: No swelling or tenderness.     Cervical back: Normal range of motion and neck supple. No rigidity. No muscular tenderness.     Right lower leg: No edema.     Left lower leg: No edema.  Skin:    General: Skin is warm and dry.     Findings: No rash.  Neurological:     Mental Status: She is alert.     Comments: Alert, speech normal. Motor/sens grossly intact bil. Steady gait.   Psychiatric:        Mood and Affect: Mood normal.    ED Results / Procedures / Treatments   Labs (all labs ordered are listed, but only abnormal results are displayed) Results for orders placed or performed during the hospital encounter of 04/26/21  CBC  Result Value Ref Range   WBC 9.2 4.0 - 10.5 K/uL   RBC 4.59 3.87 - 5.11 MIL/uL   Hemoglobin 13.6 12.0 - 15.0 g/dL   HCT 27.0 35.0 - 09.3 %   MCV 93.9 80.0 - 100.0 fL   MCH 29.6 26.0 - 34.0 pg   MCHC 31.6 30.0 - 36.0 g/dL   RDW 81.8 29.9 - 37.1 %   Platelets 330 150 - 400 K/uL   nRBC 0.0 0.0 - 0.2 %  Comprehensive metabolic panel  Result Value Ref Range   Sodium 132 (L) 135 - 145 mmol/L   Potassium 4.1 3.5 - 5.1 mmol/L   Chloride 101 98 - 111 mmol/L   CO2 19 (L) 22 - 32 mmol/L   Glucose, Bld 76 70 - 99 mg/dL   BUN 25 (H) 6 - 20 mg/dL   Creatinine, Ser 6.96 (H) 0.44 - 1.00 mg/dL   Calcium 8.4 (L) 8.9 - 10.3 mg/dL   Total Protein 7.0 6.5 - 8.1 g/dL   Albumin 3.9 3.5 - 5.0 g/dL   AST 11 (L) 15 - 41 U/L   ALT <5 0 - 44 U/L   Alkaline Phosphatase 51 38 - 126 U/L   Total Bilirubin 0.4 0.3 - 1.2 mg/dL   GFR, Estimated 29 (L) >60 mL/min   Anion gap 12 5 - 15  Urinalysis, Routine w reflex microscopic Urine, Clean Catch  Result Value Ref Range   Color, Urine YELLOW YELLOW   APPearance CLEAR CLEAR   Specific Gravity, Urine 1.031 (H) 1.005 - 1.030   pH 5.0 5.0 - 8.0   Glucose, UA  NEGATIVE NEGATIVE mg/dL   Hgb urine dipstick NEGATIVE NEGATIVE   Bilirubin Urine NEGATIVE NEGATIVE   Ketones, ur NEGATIVE NEGATIVE mg/dL   Protein, ur TRACE (A) NEGATIVE mg/dL   Nitrite NEGATIVE NEGATIVE   Leukocytes,Ua NEGATIVE NEGATIVE     EKG EKG Interpretation  Date/Time:  Thursday April 26 2021 17:22:27 EDT Ventricular Rate:  59 PR Interval:  144 QRS Duration: 92 QT Interval:  458 QTC Calculation: 453 R Axis:   4 Text Interpretation: Sinus bradycardia Confirmed by Cathren Laine ([id number]) on 04/26/2021 5:39:57 PM  Radiology No  results found.  Procedures Procedures   Medications Ordered in ED Medications  sodium chloride 0.9 % bolus 1,000 mL (has no administration in time range)    ED Course  I have reviewed the triage vital signs and the nursing notes.  Pertinent labs & imaging results that were available during my care of the patient were reviewed by me and considered in my medical decision making (see chart for details).    MDM Rules/Calculators/A&P                           Iv ns bolus. Stat labs.   Reviewed nursing notes and prior charts for additional history.   Labs reviewed/interpreted by me - hco3 mildly low - ?reflecting volume depletion. Bun/cre also elevated. Additional LR bolus.   Bp is mildly improved from prior, still soft.   Given symptomatic, near syncopal, low bp, dehydration, aki - will admit.  Hospitalists consulted for admission.   CRITICAL CARE RE: hypotension with AKI.  Performed by: Suzi Roots Total critical care time: 40 minutes Critical care time was exclusive of separately billable procedures and treating other patients. Critical care was necessary to treat or prevent imminent or life-threatening deterioration. Critical care was time spent personally by me on the following activities: development of treatment plan with patient and/or surrogate as well as nursing, discussions with consultants, evaluation of patient's response to  treatment, examination of patient, obtaining history from patient or surrogate, ordering and performing treatments and interventions, ordering and review of laboratory studies, ordering and review of radiographic studies, pulse oximetry and re-evaluation of patient's condition.  Discussed pt with Dr Loney Loh - she indicates she will admit and will place admit/bed order.  Discussed w pt, pt agreeable.  Final Clinical Impression(s) / ED Diagnoses Final diagnoses:  None    Rx / DC Orders ED Discharge Orders     None           Cathren Laine, MD 04/26/21 2053

## 2021-04-26 NOTE — ED Notes (Signed)
[facility] called for Transportation to [facility].

## 2021-04-26 NOTE — ED Notes (Signed)
Consult for Hospitalist Completed by this RN.

## 2021-04-26 NOTE — Progress Notes (Signed)
OFFICE VISIT  04/26/2021  CC:  Chief Complaint  Patient presents with   Hypertension    Pt is not fasting;    HPI:    Patient is a 45 y.o. female who presents for 1 mo f/u uncontrolled HTN. A/P as of last visit: "1) Uncontrolled HTN (diastolic for the most part). Inc lisin to 20mg  qd.  Cont hctz 25mg  qd and toprol xl 100 qd. Lytes/cr were normal in the ED 12 days after I added lisinopril so I don't need to repeat this today. Cont home bp/hr monitoring. Lytes/cr recheck at next f/u in 3-4 wks.   2) Neck muscle strain/pain->resolved."  INTERIM HX: When nurse checked pt in today she immediately got me to the room b/c pt's bp was 70s/40s and she looked pale and lethargic. Pt states she doesn't feel significantly any diff than normal: always tired and feels like "sugar low".  HA's more often lately. Only new med recently is a course of macrobid for uti. Says fluid intake is good---coffee in am, then water all day. She took all 3 of her bp meds about 7-8 hours ago. Home bp's: 110-124/74-97 since last visit.  She states none of her home readings have been significantly low like today's.. Feels a little bit dizzy when she stands up sometimes.  No presyncope.  She last ate about 2 hrs ago--chicken soup. Says last week had an episode in which she was eating--almost finished with a meal and says glucose was 58. Eating sometimes causes early satiety and nausea but about 80% of the time she doesn't feel any adverse effects from eating.  Ever since her gastric bypass surg in 2003 she has had to eat small meals quite frequently in order to avoid hypoglycemia.   Endo eval 2018, monitored fast with f/u blood testing all wnl.  Sounds like endo still suspected reactive hypoglycemia. Pt has seen a nutritionist and they recommended she see a bariatric surgeon again but not clear why.  ROS as above, plus--> no fevers, no CP, no SOB, no wheezing, no cough, no vertigo, no rashes, no melena/hematochezia.   No polyuria or polydipsia.  No myalgias or arthralgias.  No focal weakness, paresthesias, or tremors.  No acute vision or hearing abnormalities.  No dysuria or unusual/new urinary urgency or frequency.  No recent changes in lower legs. No n/v/d or abd pain.  No palpitations.    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  (Normal cervical MRI 2010 per ortho notes).   Dietary iron deficiency without anemia    Easy bruisability 2011   Hx of: saw Hematologist (Dr. 04/2016) and w/u neg for von Willebrand desease.   GERD (gastroesophageal reflux disease)    hx gastric ulcer and upper GI bleed, ? 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 Pt to start FeSO4 325 qd 10/28/19   Irregular menses 03/2018   GYN started Femyn[facility] 0.25-0.35.  [facility] 10[facility]30[facility] as of 10/2018.  u/s wnl.  Endom bx NEG. OCPs helped.   Malabsorption syndrome    due  to roux-en-Y   Medial epicondylitis of elbow, left 12/2019   [facility]   Migraine    Osteoarthritis of carpometacarpal [facility] joint of right thumb 12/2019   [facility]   Reactive hypoglycemia    Dr. Everardo AllEllison   Seizure [facility]) 2015   s/p eval with neuro, no medication, no events since   Syncope    with ? seizure in 2015, eval with neruo   Vitamin B12 deficiency    Vitamin D deficiency     Past Surgical History:  Procedure Laterality Date   CESAREAN SECTION  2006   ELBOW X-RAY Left 12/07/2019   ENDOMETRIAL BIOPSY     EYE SURGERY Bilateral    2001   FINGER X-RAY Right 12/07/2019   ROUX-EN-Y GASTRIC BYPASS  01/05/2002   TONSILLECTOMY AND ADENOIDECTOMY     TUBAL  LIGATION  2006   tubes in ears both Bilateral     Outpatient Medications Prior to Visit  Medication Sig Dispense Refill   Alcohol Swabs (ALCOHOL WIPES) 70 % PADS Use to clean finger before checking blood sugar, checks blood sugar 3-4 times daily 100 each 11   ALPRAZolam (XANAX) 1 MG tablet TAKE 2 TABLETS BY MOUTH IN THE MORNING & 2 TABLETS AT BEDTIME 120 tablet 5   Calcium 250 MG CAPS Take 1 capsule by mouth daily.      cyanocobalamin (,VITAMIN B-12,) 1000 MCG/ML injection INJECT 1 ML INTO THE MUSCLE EVERY 2 WEEKS 10 mL 6   DULoxetine (CYMBALTA) 20 MG capsule Take 1 capsule (20 mg total) by mouth 2 (two) times daily. 180 capsule 0   FEMYNOR 0.25-35 MG-MCG tablet TAKE 1 TABLET BY MOUTH ONCE DAILY. 84 tablet 3   ferrous gluconate (FERGON) 324 MG tablet TAKE 1 TABLET (324 MG TOTAL) BY MOUTH DAILY WITH BREAKFAST. 90 tablet 3   fluticasone (FLONASE) 50 MCG/ACT nasal spray PLACE 2 SPRAYS INTO BOTH NOSTRILS DAILY. 16 g 6   gabapentin (NEURONTIN) 100 MG capsule TAKE 1 CAPSULE BY MOUTH THREE TIMES DAILY. 90 capsule 2   glucose blood (FREESTYLE LITE) test strip USE TO CHECK BLOOD SUGAR 3 TIMES A DAY 200 strip 12   hydrochlorothiazide (HYDRODIURIL) 25 MG tablet Take 1 tablet (25 mg total) by mouth daily. 30 tablet 0   lamoTRIgine (LAMICTAL) 150 MG tablet Take 1 tablet (150 mg total) by mouth daily. 90 tablet 0   lisinopril (ZESTRIL) 20 MG tablet Take 1 tablet (20 mg total) by mouth daily. 30 tablet 0   methocarbamol (ROBAXIN) 500 MG tablet Take 1 tablet (500 mg total) by mouth every 12 (twelve) hours as needed for muscle spasms. 20 tablet 0   metoprolol succinate (TOPROL-XL) 100 MG 24 hr tablet Take 1 tablet (100 mg total) by mouth daily with or immediately following a meal 30 tablet 0   montelukast (SINGULAIR) 10 MG tablet Take 1 tablet (10 mg total) by mouth at bedtime. 30 tablet 5   nitrofurantoin, macrocrystal-monohydrate, (MACROBID) 100 MG capsule Take 1 capsule (100 mg total) by mouth 2 (two) times  daily after a meal for 5 days 10 capsule 0   norgestimate-ethinyl estradiol (ORTHO-CYCLEN) 0.25-35 MG-MCG tablet TAKE 1 TABLET BY MOUTH ONCE DAILY. 84 tablet 4   nystatin-triamcinolone ointment (MYCOLOG) APPLY TO THE AFFECTED AREA(S) BY TOPICAL ROUTE 2 TIMES PER DAY AS NEEDED 30 g 0   omeprazole (PRILOSEC) 40 MG capsule TAKE 1 CAPSULE BY MOUTH TWO TIMES DAILY 180 capsule 3   oxyCODONE-acetaminophen (PERCOCET) 10-325 MG tablet TK 1 T PO QID PRN  potassium chloride SA (K-DUR) 20 MEQ tablet TAKE 1 TABLET BY MOUTH DAILY. 90 tablet 1   QUEtiapine (SEROQUEL) 100 MG tablet Take 1 tablet (100 mg total) by mouth at bedtime as needed for insomnia 30 tablet 1   rizatriptan (MAXALT-MLT) 10 MG disintegrating tablet 1 tab po qd as needed for migraine headache.  May repeat dose in 2 hours if needed.  Max dose in 24h is 20 mg. (Patient taking differently: 1 tab po qd as needed for migraine headache.  May repeat dose in 2 hours if needed.  Max dose in 24h is 20 mg.) 10 tablet 5   Vitamin D, Ergocalciferol, (DRISDOL) 1.25 MG (50000 UNIT) CAPS capsule TAKE 1 CAPSULE BY MOUTH TWICE WEEKLY 24 capsule 3   diclofenac Sodium (VOLTAREN) 1 % GEL Apply 1 application topically 4 (four) times daily.     doxepin (SINEQUAN) 10 MG capsule Take 1-2 capsules (10-20 mg total) by mouth at bedtime. 60 capsule 1   No facility-administered medications prior to visit.    Allergies  Allergen Reactions   Ibuprofen     Causes ulcers    ROS As per HPI  PE: Vitals with BMI 04/26/2021 04/02/2021 03/30/2021  Height 5' 4.5" - 5' 4.5"  Weight 270 lbs 265 lbs 5 oz 265 lbs 3 oz  BMI 45.65 44.85 44.83  Systolic - - 121  Diastolic - - 81  Pulse 59 - 71  Some encounter information is confidential and restricted. Go to Review Flowsheets activity to see all data.  Manual bp recheck after in room 10 min->80/60 Gen: Alert, tired-appearing but NAD.  Patient is oriented to person, place, time, and situation. Attention is normal, follows  commands appropriately.  Thought and speech are lucid. ZOX:WRUE: no injection, icteris, swelling, or exudate.  EOMI, PERRLA. Mouth: lips without lesion/swelling.  Oral mucosa pink and moist. Oropharynx without erythema, exudate, or swelling.  CV: RRR (rate 60), no m/r/g.   LUNGS: CTA bilat, nonlabored resps, good aeration in all lung fields. EXT: no clubbing or cyanosis.  no edema.  Neuro: CN 2-12 intact bilaterally, strength 5/5 in proximal and distal upper extremities and lower extremities bilaterally.  No tremor.  FNF normal bilat.  No ataxia.  No pronator drift.    LABS:    Chemistry      Component Value Date/Time   NA 139 03/04/2021 1334   K 4.3 03/04/2021 1334   CL 107 03/04/2021 1334   CO2 23 03/04/2021 1334   BUN 14 03/04/2021 1334   CREATININE 0.72 03/04/2021 1334      Component Value Date/Time   CALCIUM 8.3 (L) 03/04/2021 1334   ALKPHOS 66 11/24/2020 0927   AST 11 11/24/2020 0927   ALT 5 11/24/2020 0927   BILITOT 0.3 11/24/2020 0927     Lab Results  Component Value Date   WBC 6.4 03/04/2021   HGB 12.2 03/04/2021   HCT 38.1 03/04/2021   MCV 91.8 03/04/2021   PLT 332 03/04/2021   Lab Results  Component Value Date   HGBA1C 4.6 08/07/2017   IMPRESSION AND PLAN:  1) Hypotension and lethargy+generalized weakness. Suspect d/t overmedication with antihypertensives. Pt does not give any history c/w risk for dehydration/volume depletion.  No sign of infection. At this time I think she needs to be evaluated in the ED for consideration of IVF to get bp up some. Once bp is back up I told her she can restart hctz and lisinopril one at a time, if she gets back to  restarting toprol xl I recommended limiting this to 1/2 of 100 mg tab.  2) Hypoglycemia: reactive. Hx roux-en-y, malabsorption.  No GI sx's usually. Glucose (nonfasting) 68 here initially, rose to 75 after some mt dew. Cont frequent, small meals.  An After Visit Summary was printed and given to the  patient.  FOLLOW UP: No follow-ups on file.  Signed:  Santiago Bumpers, MD           04/26/2021

## 2021-04-26 NOTE — ED Triage Notes (Addendum)
Pt was sent from her MD office for further evaluation of low blood pressure.  She states she thinks systolic BP was 76 at MD office.  States her MD has been trying to regulate her BP meds and recently increased them about a month ago. She states her blood sugar was also low, thinks it was 66.  States she is dizzy, lightheadedness, and nausea.  Denies fever or diarrhea.

## 2021-04-26 NOTE — Plan of Care (Signed)
[facility] will assume care on arrival to accepting facility. Until arrival, care as per EDP. However, [facility] available 24/7 for questions and assistance.  

## 2021-04-27 ENCOUNTER — Other Ambulatory Visit (HOSPITAL_COMMUNITY): Payer: Self-pay

## 2021-04-27 ENCOUNTER — Encounter (HOSPITAL_COMMUNITY): Payer: Self-pay | Admitting: Internal Medicine

## 2021-04-27 DIAGNOSIS — E861 Hypovolemia: Secondary | ICD-10-CM | POA: Diagnosis not present

## 2021-04-27 DIAGNOSIS — I959 Hypotension, unspecified: Secondary | ICD-10-CM | POA: Diagnosis not present

## 2021-04-27 DIAGNOSIS — F39 Unspecified mood [affective] disorder: Secondary | ICD-10-CM

## 2021-04-27 DIAGNOSIS — Z79899 Other long term (current) drug therapy: Secondary | ICD-10-CM | POA: Diagnosis not present

## 2021-04-27 DIAGNOSIS — Z20822 Contact with and (suspected) exposure to covid-19: Secondary | ICD-10-CM | POA: Diagnosis not present

## 2021-04-27 DIAGNOSIS — I9589 Other hypotension: Secondary | ICD-10-CM | POA: Diagnosis not present

## 2021-04-27 DIAGNOSIS — E871 Hypo-osmolality and hyponatremia: Secondary | ICD-10-CM | POA: Diagnosis not present

## 2021-04-27 DIAGNOSIS — E669 Obesity, unspecified: Secondary | ICD-10-CM

## 2021-04-27 DIAGNOSIS — N179 Acute kidney failure, unspecified: Secondary | ICD-10-CM | POA: Diagnosis present

## 2021-04-27 DIAGNOSIS — I10 Essential (primary) hypertension: Secondary | ICD-10-CM | POA: Diagnosis not present

## 2021-04-27 DIAGNOSIS — K2100 Gastro-esophageal reflux disease with esophagitis, without bleeding: Secondary | ICD-10-CM

## 2021-04-27 LAB — RENAL FUNCTION PANEL
Albumin: 2.8 g/dL — ABNORMAL LOW (ref 3.5–5.0)
Anion gap: 7 (ref 5–15)
BUN: 12 mg/dL (ref 6–20)
CO2: 22 mmol/L (ref 22–32)
Calcium: 8.2 mg/dL — ABNORMAL LOW (ref 8.9–10.3)
Chloride: 108 mmol/L (ref 98–111)
Creatinine, Ser: 0.94 mg/dL (ref 0.44–1.00)
GFR, Estimated: >60 mL/min (ref >60)
Glucose, Bld: 87 mg/dL (ref 70–99)
Phosphorus: 2.2 mg/dL — ABNORMAL LOW (ref 2.5–4.6)
Potassium: 3.9 mmol/L (ref 3.5–5.1)
Sodium: 137 mmol/L (ref 135–145)

## 2021-04-27 LAB — HIV ANTIBODY (ROUTINE TESTING W REFLEX): HIV Screen 4th Generation wRfx: NONREACTIVE

## 2021-04-27 MED ORDER — OXYCODONE-ACETAMINOPHEN 10-325 MG PO TABS: 1.0000 | ORAL_TABLET | Freq: Three times a day (TID) | ORAL | Status: DC | PRN

## 2021-04-27 MED ORDER — ENOXAPARIN SODIUM 60 MG/0.6ML IJ SOSY
60.0000 mg | PREFILLED_SYRINGE | INTRAMUSCULAR | Status: DC
  Administered 2021-04-27: 60 mg via SUBCUTANEOUS
  Filled 2021-04-27: qty 0.6

## 2021-04-27 MED ORDER — PANTOPRAZOLE SODIUM 40 MG PO TBEC
40.0000 mg | DELAYED_RELEASE_TABLET | Freq: Every day | ORAL | Status: DC
  Administered 2021-04-27: 40 mg via ORAL
  Filled 2021-04-27: qty 1

## 2021-04-27 MED ORDER — METHOCARBAMOL 500 MG PO TABS: 500.0000 mg | ORAL_TABLET | Freq: Two times a day (BID) | ORAL | Status: DC | PRN

## 2021-04-27 MED ORDER — ACETAMINOPHEN 325 MG PO TABS: 650.0000 mg | ORAL_TABLET | Freq: Four times a day (QID) | ORAL | Status: DC | PRN

## 2021-04-27 MED ORDER — LACTATED RINGERS IV BOLUS
2000.0000 mL | Freq: Once | INTRAVENOUS | Status: AC
  Administered 2021-04-27: 2000 mL via INTRAVENOUS

## 2021-04-27 MED ORDER — ONDANSETRON HCL 4 MG PO TABS: 4.0000 mg | ORAL_TABLET | Freq: Four times a day (QID) | ORAL | Status: DC | PRN

## 2021-04-27 MED ORDER — ACETAMINOPHEN 500 MG PO TABS: 1000.0000 mg | ORAL_TABLET | Freq: Four times a day (QID) | ORAL | Status: DC | PRN

## 2021-04-27 MED ORDER — FLUTICASONE PROPIONATE 50 MCG/ACT NA SUSP
2.0000 | Freq: Every day | NASAL | Status: DC
  Administered 2021-04-27: 2 via NASAL
  Filled 2021-04-27: qty 16

## 2021-04-27 MED ORDER — ACETAMINOPHEN 650 MG RE SUPP: 975.0000 mg | Freq: Four times a day (QID) | RECTAL | Status: DC | PRN

## 2021-04-27 MED ORDER — ALPRAZOLAM 0.5 MG PO TABS
2.0000 mg | ORAL_TABLET | Freq: Two times a day (BID) | ORAL | Status: DC | PRN
  Administered 2021-04-27 (×3): 2 mg via ORAL
  Filled 2021-04-27 (×3): qty 4

## 2021-04-27 MED ORDER — SODIUM CHLORIDE 0.9 % IV SOLN: INTRAVENOUS | Status: DC

## 2021-04-27 MED ORDER — LAMOTRIGINE 100 MG PO TABS
150.0000 mg | ORAL_TABLET | Freq: Every day | ORAL | Status: DC
  Filled 2021-04-27: qty 2

## 2021-04-27 MED ORDER — FERROUS GLUCONATE 324 (38 FE) MG PO TABS
324.0000 mg | ORAL_TABLET | Freq: Every day | ORAL | Status: DC
  Administered 2021-04-27: 324 mg via ORAL
  Filled 2021-04-27 (×2): qty 1

## 2021-04-27 MED ORDER — OXYCODONE-ACETAMINOPHEN 5-325 MG PO TABS
1.0000 | ORAL_TABLET | Freq: Once | ORAL | Status: AC
  Administered 2021-04-27: 1 via ORAL
  Filled 2021-04-27: qty 1

## 2021-04-27 MED ORDER — OXYCODONE HCL 5 MG PO TABS
5.0000 mg | ORAL_TABLET | Freq: Once | ORAL | Status: AC
  Administered 2021-04-27: 5 mg via ORAL
  Filled 2021-04-27: qty 1

## 2021-04-27 MED ORDER — QUETIAPINE FUMARATE 100 MG PO TABS: 100.0000 mg | ORAL_TABLET | Freq: Every evening | ORAL | Status: DC | PRN

## 2021-04-27 MED ORDER — ENOXAPARIN SODIUM 40 MG/0.4ML IJ SOSY: 40.0000 mg | PREFILLED_SYRINGE | INTRAMUSCULAR | Status: DC

## 2021-04-27 MED ORDER — DULOXETINE HCL 20 MG PO CPEP
20.0000 mg | ORAL_CAPSULE | Freq: Two times a day (BID) | ORAL | Status: DC
  Administered 2021-04-27 (×2): 20 mg via ORAL
  Filled 2021-04-27 (×3): qty 1

## 2021-04-27 MED ORDER — ONDANSETRON HCL 4 MG/2ML IJ SOLN: 4.0000 mg | Freq: Four times a day (QID) | INTRAMUSCULAR | Status: DC | PRN

## 2021-04-27 MED ORDER — GABAPENTIN 300 MG PO CAPS
300.0000 mg | ORAL_CAPSULE | Freq: Three times a day (TID) | ORAL | Status: DC
  Administered 2021-04-27 (×2): 300 mg via ORAL
  Filled 2021-04-27 (×2): qty 1

## 2021-04-27 MED ORDER — ACETAMINOPHEN 650 MG RE SUPP: 650.0000 mg | Freq: Four times a day (QID) | RECTAL | Status: DC | PRN

## 2021-04-27 MED ORDER — CALCIUM CARBONATE ANTACID 500 MG PO CHEW
200.0000 mg | CHEWABLE_TABLET | Freq: Every day | ORAL | Status: DC
  Filled 2021-04-27: qty 1

## 2021-04-27 MED ORDER — OXYCODONE HCL 5 MG PO TABS
5.0000 mg | ORAL_TABLET | ORAL | Status: DC | PRN
  Administered 2021-04-27 (×3): 5 mg via ORAL
  Filled 2021-04-27 (×3): qty 1

## 2021-04-27 MED ORDER — NORGESTIMATE-ETH ESTRADIOL 0.25-35 MG-MCG PO TABS: 1.0000 | ORAL_TABLET | Freq: Every day | ORAL | Status: DC

## 2021-04-27 MED ORDER — SUMATRIPTAN SUCCINATE 25 MG PO TABS
100.0000 mg | ORAL_TABLET | Freq: Once | ORAL | Status: DC | PRN
  Filled 2021-04-27: qty 4

## 2021-04-27 MED ORDER — OXYCODONE-ACETAMINOPHEN 5-325 MG PO TABS
1.0000 | ORAL_TABLET | ORAL | Status: DC | PRN
  Administered 2021-04-27 (×3): 1 via ORAL
  Filled 2021-04-27 (×3): qty 1

## 2021-04-27 MED ORDER — MONTELUKAST SODIUM 10 MG PO TABS: 10.0000 mg | ORAL_TABLET | Freq: Every day | ORAL | Status: DC

## 2021-04-27 NOTE — Discharge Summary (Signed)
Physician Discharge Summary  Toni [name] ZDG:[id number] DOB: November 27, 1975 DOA: 04/26/2021  PCP: Jeoffrey Massed, MD  Admit date: 04/26/2021 Discharge date: 04/27/2021  Admitted From: Home Disposition: Home  Recommendations for Outpatient Follow-up:  Follow up with PCP in 1-2 weeks Please obtain BMP/CBC in one week  Home Health: None Equipment/Devices: None  Discharge Condition: Stable CODE STATUS: Full Diet recommendation: Regular  Brief/Interim Summary: Toni [name] is a 45 y.o. 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  Her SBP was documented to be 76 mmHg at the PCPs office.  She stated that her doctor has been trying to control her blood pressure numbers.  She used to be on hydrochlorothiazide for a long time, then this was discontinued, metoprolol succinate 100 mg p.o. daily along with lisinopril 20 mg daily was started.  However, this combination was not effective enough and her HCTZ was restarted over a week ago.  She has been lightheaded particularly with positional changes.  She denies chest pain, palpitations, diaphoresis, PND, orthopnea or recent pitting edema of the lower extremities.    Patient as above with acute symptomatic hypotension likely in the setting of polypharmacy as well as AKI in setting of diuretics and poor p.o. intake.  At this time after IV fluids patient's creatinine is within normal  limits, patient's blood pressure remains low normal despite being off her home medications now for 24 hours.  We will discontinue patient on home medications other than her blood pressure medications which have all been discontinued.  We discussed she would likely need a washout period over the weekend with close follow-up early next week to discuss blood pressure medication adjustment with her PCP.  If she remains difficult to control it might be reasonable to have cardiology evaluate patient as she may be someone who has profound orthostatic hypotension and thus will need to run somewhat hypertensive at rest when seated or supine.  Discharge Diagnoses:  Principal Problem:   Hypotension Active Problems:   Esophageal reflux   Class 3 obesity with BMI of 43.80   Anxiety and depression   Hyponatremia   AKI (acute kidney injury) [facility])    Discharge Instructions  Discharge Instructions     Call MD for:  extreme fatigue   Complete by: As directed    Call MD for:  persistant dizziness or light-headedness   Complete by: As directed    Diet - low sodium heart healthy   Complete by: As directed    Increase activity slowly   Complete by: As directed       Allergies as of 04/27/2021       Reactions   Ibuprofen    Causes ulcers        Medication List     STOP taking these medications    hydrochlorothiazide 25 MG tablet Commonly known as: HYDRODIURIL   lisinopril 20 MG tablet Commonly known as: ZESTRIL   metoprolol succinate 100 MG 24 hr tablet Commonly known as: TOPROL-XL   nitrofurantoin (macrocrystal-monohydrate) 100 MG capsule Commonly  known as: MACROBID       TAKE these medications    Alcohol Wipes 70 % Pads Use to clean finger before checking blood sugar, checks blood sugar 3-4 times daily   ALPRAZolam 1 MG tablet Commonly known as: XANAX TAKE 2 TABLETS BY MOUTH IN THE MORNING & 2 TABLETS AT BEDTIME   Calcium 250 MG Caps Take 1 capsule by mouth 3 (three)  times daily.   cyanocobalamin 1000 MCG/ML injection Commonly known as: (VITAMIN B-12) INJECT 1 ML INTO THE MUSCLE EVERY 2 WEEKS What changed:  how much to take how to take this when to take this   DULoxetine 20 MG capsule Commonly known as: CYMBALTA Take 1 capsule (20 mg total) by mouth 2 (two) times daily. What changed:  how much to take when to take this   Ensure Max Protein Liqd Take 11 oz by mouth daily as needed (low Glucose). Chocolate   Femynor 0.25-35 MG-MCG tablet Generic drug: norgestimate-ethinyl estradiol TAKE 1 TABLET BY MOUTH ONCE DAILY. What changed: Another medication with the same name was removed. Continue taking this medication, and follow the directions you see here.   ferrous gluconate 324 MG tablet Commonly known as: FERGON TAKE 1 TABLET (324 MG TOTAL) BY MOUTH DAILY WITH BREAKFAST. What changed:  how much to take how to take this when to take this   fluticasone 50 MCG/ACT nasal spray Commonly known as: FLONASE PLACE 2 SPRAYS INTO BOTH NOSTRILS DAILY. What changed:  when to take this reasons to take this   FREESTYLE LITE test strip Generic drug: glucose blood USE TO CHECK BLOOD SUGAR 3 TIMES A DAY   gabapentin 100 MG capsule Commonly known as: NEURONTIN TAKE 1 CAPSULE BY MOUTH THREE TIMES DAILY. What changed: how much to take   lamoTRIgine 150 MG tablet Commonly known as: LAMICTAL Take 1 tablet (150 mg total) by mouth daily. What changed: when to take this   methocarbamol 500 MG tablet Commonly known as: ROBAXIN Take 1 tablet (500 mg total) by mouth every 12 (twelve) hours as needed for muscle spasms.   montelukast 10 MG tablet Commonly known as: SINGULAIR Take 1 tablet (10 mg total) by mouth at bedtime. What changed:  when to take this reasons to take this   nystatin-triamcinolone ointment Commonly known as: MYCOLOG APPLY TO THE AFFECTED AREA(S) BY TOPICAL ROUTE 2 TIMES PER DAY AS NEEDED   omeprazole 40 MG capsule Commonly  known as: PRILOSEC TAKE 1 CAPSULE BY MOUTH TWO TIMES DAILY What changed: how much to take   oxyCODONE-acetaminophen 10-325 MG tablet Commonly known as: PERCOCET Take 1 tablet by mouth every 6 (six) hours as needed for pain.   potassium chloride SA 20 MEQ tablet Commonly known as: KLOR-CON TAKE 1 TABLET BY MOUTH DAILY.   rizatriptan 10 MG disintegrating tablet Commonly known as: MAXALT-MLT 1 tab po qd as needed for migraine headache.  May repeat dose in 2 hours if needed.  Max dose in 24h is 20 mg.   Vitamin D (Ergocalciferol) 1.25 MG (50000 UNIT) Caps capsule Commonly known as: DRISDOL TAKE 1 CAPSULE BY MOUTH TWICE WEEKLY What changed: how much to take        Allergies  Allergen Reactions   Ibuprofen     Causes ulcers    Consultations: None  Procedures/Studies: No results found.   Subjective: No acute issues or events overnight denies nausea vomiting diarrhea constipation any fevers or chills.  She was able to ambulate around the room today without  any further symptoms of lightheadedness dizziness or near syncope   Discharge Exam: Vitals:   04/27/21 0614 04/27/21 0747  BP: (!) 106/59 (!) 91/47  Pulse:  75  Resp:  19  Temp:  98.4 F (36.9 C)  SpO2:  100%   Vitals:   04/27/21 0023 04/27/21 0316 04/27/21 0614 04/27/21 0747  BP: 128/67 (!) 107/52 (!) 106/59 (!) 91/47  Pulse: 66 69  75  Resp: Temp: 97.9 F (36.6 C) 98.2 F (36.8 C)  98.4 F (36.9 C)  TempSrc: Oral Oral  Oral  SpO2: 100% 100%  100%  Weight:      Height:        General: Pt is alert, awake, not in acute distress Cardiovascular: RRR, S1/S2 +, no rubs, no gallops Respiratory: CTA bilaterally, no wheezing, no rhonchi Abdominal: Soft, NT, ND, bowel sounds + Extremities: no edema, no cyanosis    The results of significant diagnostics from this hospitalization (including imaging, microbiology, ancillary and laboratory) are listed below for reference.     Microbiology: Recent  Results (from the past 240 hour(s))  Resp Panel by RT-PCR (Flu A&B, Covid) Nasopharyngeal Swab     Status: None   Collection Time: 04/26/21  9:23 PM   Specimen: Nasopharyngeal Swab; Nasopharyngeal(NP) swabs in vial transport medium  Result Value Ref Range Status   SARS Coronavirus 2 by RT PCR NEGATIVE NEGATIVE Final    Comment: (NOTE) SARS-CoV-2 target nucleic acids are NOT DETECTED.  The SARS-CoV-2 RNA is generally detectable in upper respiratory specimens during the acute phase of infection. The lowest concentration of SARS-CoV-2 viral copies this assay can detect is 138 copies/mL. A negative result does not preclude SARS-Cov-2 infection and should not be used as the sole basis for treatment or other patient management decisions. A negative result may occur with  improper specimen collection/handling, submission of specimen other than nasopharyngeal swab, presence of viral mutation(s) within the areas targeted by this assay, and inadequate number of viral copies(<138 copies/mL). A negative result must be combined with clinical observations, patient history, and epidemiological information. The expected result is Negative.  Fact Sheet for Patients:  BloggerCourse.com  Fact Sheet for Healthcare Providers:  SeriousBroker.it  This test is no t yet approved or cleared by the [place] FDA and  has been authorized for detection and/or diagnosis of SARS-CoV-2 by FDA under an Emergency Use Authorization (EUA). This EUA will remain  in effect (meaning this test can be used) for the duration of the COVID-19 declaration under Section 564(b)(1) of the Act, 21 U.S.C.section 360bbb-3(b)(1), unless the authorization is terminated  or revoked sooner.       Influenza A by PCR NEGATIVE NEGATIVE Final   Influenza B by PCR NEGATIVE NEGATIVE Final    Comment: (NOTE) The Xpert Xpress SARS-CoV-2/FLU/RSV plus assay is intended as an aid in the  diagnosis of influenza from Nasopharyngeal swab specimens and should not be used as a sole basis for treatment. Nasal washings and aspirates are unacceptable for Xpert Xpress SARS-CoV-2/FLU/RSV testing.  Fact Sheet for Patients: BloggerCourse.com  Fact Sheet for Healthcare Providers: SeriousBroker.it  This test is not yet approved or cleared by the [place] FDA and has been authorized for detection and/or diagnosis of SARS-CoV-2 by FDA under an Emergency Use Authorization (EUA). This EUA will remain in effect (meaning this test can be used) for the duration of the COVID-19 declaration under Section 564(b)(1) of the Act, 21 U.S.C. section 360bbb-3(b)(1), unless the authorization is  terminated or revoked.  Performed at [facility], [address] [place], [place] [address]      Labs: BNP (last 3 results) No results for input(s): BNP in the last 8760 hours. Basic Metabolic Panel: Recent Labs  Lab 04/26/21 1718 04/27/21 1135  NA 132* 137  K 4.1 3.9  CL 101 108  CO2 19* 22  GLUCOSE 76 87  BUN 25* 12  CREATININE 2.12* 0.94  CALCIUM 8.4* 8.2*  PHOS  --  2.2*   Liver Function Tests: Recent Labs  Lab 04/26/21 1718 04/27/21 1135  AST 11*  --   ALT <5  --   ALKPHOS 51  --   BILITOT 0.4  --   PROT 7.0  --   ALBUMIN 3.9 2.8*   No results for input(s): LIPASE, AMYLASE in the last 168 hours. No results for input(s): AMMONIA in the last 168 hours. CBC: Recent Labs  Lab 04/26/21 1718  WBC 9.2  HGB 13.6  HCT 43.1  MCV 93.9  PLT 330   Cardiac Enzymes: No results for input(s): CKTOTAL, CKMB, CKMBINDEX, TROPONINI in the last 168 hours. BNP: Invalid input(s): POCBNP CBG: Recent Labs  Lab 04/26/21 2333  GLUCAP 76   D-Dimer No results for input(s): DDIMER in the last 72 hours. Hgb A1c No results for input(s): HGBA1C in the last 72 hours. Lipid Profile No results for input(s): CHOL,  HDL, LDLCALC, TRIG, CHOLHDL, LDLDIRECT in the last 72 hours. Thyroid function studies No results for input(s): TSH, T4TOTAL, T3FREE, THYROIDAB in the last 72 hours.  Invalid input(s): FREET3 Anemia work up No results for input(s): VITAMINB12, FOLATE, FERRITIN, TIBC, IRON, RETICCTPCT in the last 72 hours. Urinalysis    Component Value Date/Time   COLORURINE YELLOW 04/26/2021 1737   APPEARANCEUR CLEAR 04/26/2021 1737   LABSPEC 1.031 (H) 04/26/2021 1737   PHURINE 5.0 04/26/2021 1737   GLUCOSEU NEGATIVE 04/26/2021 1737   GLUCOSEU NEGATIVE 01/04/2021 1548   HGBUR NEGATIVE 04/26/2021 1737   BILIRUBINUR NEGATIVE 04/26/2021 1737   BILIRUBINUR Negative 01/04/2021 1541   KETONESUR NEGATIVE 04/26/2021 1737   PROTEINUR TRACE (A) 04/26/2021 1737   UROBILINOGEN 0.2 01/04/2021 1548   UROBILINOGEN 0.2 01/04/2021 1541   NITRITE NEGATIVE 04/26/2021 1737   LEUKOCYTESUR NEGATIVE 04/26/2021 1737   Sepsis Labs Invalid input(s): PROCALCITONIN,  WBC,  LACTICIDVEN Microbiology Recent Results (from the past 240 hour(s))  Resp Panel by RT-PCR (Flu A&B, Covid) Nasopharyngeal Swab     Status: None   Collection Time: 04/26/21  9:23 PM   Specimen: Nasopharyngeal Swab; Nasopharyngeal(NP) swabs in vial transport medium  Result Value Ref Range Status   SARS Coronavirus 2 by RT PCR NEGATIVE NEGATIVE Final    Comment: (NOTE) SARS-CoV-2 target nucleic acids are NOT DETECTED.  The SARS-CoV-2 RNA is generally detectable in upper respiratory specimens during the acute phase of infection. The lowest concentration of SARS-CoV-2 viral copies this assay can detect is 138 copies/mL. A negative result does not preclude SARS-Cov-2 infection and should not be used as the sole basis for treatment or other patient management decisions. A negative result may occur with  improper specimen collection/handling, submission of specimen other than nasopharyngeal swab, presence of viral mutation(s) within the areas targeted by  this assay, and inadequate number of viral copies(<138 copies/mL). A negative result must be combined with clinical observations, patient history, and epidemiological information. The expected result is Negative.  Fact Sheet for Patients:  BloggerCourse.com  Fact Sheet for Healthcare Providers:  SeriousBroker.it  This test is  no t yet approved or cleared by the [place] and  has been authorized for detection and/or diagnosis of SARS-CoV-2 by FDA under an Emergency Use Authorization (EUA). This EUA will remain  in effect (meaning this test can be used) for the duration of the COVID-19 declaration under Section 564(b)(1) of the Act, 21 U.S.C.section 360bbb-3(b)(1), unless the authorization is terminated  or revoked sooner.       Influenza A by PCR NEGATIVE NEGATIVE Final   Influenza B by PCR NEGATIVE NEGATIVE Final    Comment: (NOTE) The Xpert Xpress SARS-CoV-2/FLU/RSV plus assay is intended as an aid in the diagnosis of influenza from Nasopharyngeal swab specimens and should not be used as a sole basis for treatment. Nasal washings and aspirates are unacceptable for Xpert Xpress SARS-CoV-2/FLU/RSV testing.  Fact Sheet for Patients: BloggerCourse.com  Fact Sheet for Healthcare Providers: SeriousBroker.it  This test is not yet approved or cleared by the [place] FDA and has been authorized for detection and/or diagnosis of SARS-CoV-2 by FDA under an Emergency Use Authorization (EUA). This EUA will remain in effect (meaning this test can be used) for the duration of the COVID-19 declaration under Section 564(b)(1) of the Act, 21 U.S.C. section 360bbb-3(b)(1), unless the authorization is terminated or revoked.  Performed at [facility], [address] [place], [place] [address]      Time coordinating discharge: Over 30  minutes  SIGNED:   Azucena Fallen, DO [facility] 04/27/2021, 2:47 PM Pager   If 7PM-7AM, please contact night-coverage [email or web address]

## 2021-04-27 NOTE — H&P (Signed)
History and Physical    [name] PIR:[id number] DOB: 1975-11-13 DOA: 04/26/2021  PCP: Jeoffrey Massed, MD   Patient coming from: Home.  I have personally briefly reviewed patient's old medical records in [facility]  Chief Complaint: Low blood pressure.  HPI: Toni [name] is a 45 y.o. 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  Her SBP was documented to be 76 mmHg at the PCPs office.  She stated that her doctor has been trying to control her blood pressure numbers.  She used to be on hydrochlorothiazide for a long time, then this was discontinued, metoprolol succinate 100 mg p.o. daily along with lisinopril 20 mg daily was started.  However, this combination was not effective enough and her HCTZ was restarted over a week ago.  She has been lightheaded particularly with positional changes.  She denies chest pain, palpitations, diaphoresis, PND, orthopnea or recent pitting edema of the lower extremities.  No fever, chills, rhinorrhea, sore throat, wheezing or hemoptysis.  No nausea, vomiting, diarrhea, constipation, melena or hematochezia.  No dysuria, frequency or hematuria.  No polyuria, polydipsia, polyphagia or blurred vision.  ED Course: Initial vital signs were temperature 97.6 F, pulse 55, respiration 12, BP 87/51 mmHg and O2 sat 100% on room air.  The patient received acetaminophen 1000 mg p.o. x1, a 1000 mL  NS bolus and 1000 mL LR bolus.  Lab work:Marland Kitchen  Urinalysis showed increase of specific gravity at 1.031 and trace proteinuria.  CBC showed a white count of 9.2, hemoglobin 13.6 g/dL and platelets 630.  CMP showed a sodium of 132 and CO2 of 19 mmol/L.  BUN was 25 creatinine 2.12 and calcium 8.4 mg/dL.  The rest of the CMP values are unremarkable.  Review of Systems: As per HPI otherwise all other systems reviewed and are negative.  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  (Normal cervical MRI 2010 per ortho notes).   Easy bruisability 2011   Hx of: saw Hematologist (Dr. Cyndie Chime) and w/u neg for von Willebrand desease.   GERD (gastroesophageal reflux disease)    hx gastric ulcer and upper GI bleed, ? 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 Pt to start FeSO4 325 qd 10/28/19   Irregular menses 03/2018   GYN started [facility] 0.25-0.35.  [facility] as of 10/2018.  u/s wnl.  Endom bx NEG. OCPs helped.   Malabsorption syndrome    due to roux-en-Y   Medial epicondylitis of elbow, left 12/2019   [facility]   Migraine    Osteoarthritis of carpometacarpal (CMC)  joint of right thumb 12/2019   [facility]   Reactive hypoglycemia    Dr. Everardo All   Seizure [facility]) 2015   s/p eval with neuro, no medication, no events since   Syncope    with ? seizure in 2015, eval with neruo   Vitamin B12 deficiency    Vitamin D deficiency    Past Surgical History:  Procedure Laterality Date   CESAREAN SECTION  2006   ELBOW X-RAY Left 12/07/2019   ENDOMETRIAL BIOPSY     EYE SURGERY Bilateral    2001   FINGER X-RAY  Right 12/07/2019   ROUX-EN-Y GASTRIC BYPASS  01/05/2002   TONSILLECTOMY AND ADENOIDECTOMY     TUBAL LIGATION  2006   tubes in ears both Bilateral    Social History  reports that she has never smoked. She has never used smokeless tobacco. She reports current alcohol use. She reports that she does not use drugs.  Allergies  Allergen Reactions   Ibuprofen     Causes ulcers   Family History  Problem Relation Age of Onset   Arthritis Mother    Hyperlipidemia Mother    Heart disease Mother    Diabetes Mother    Asthma Mother    Aneurysm Father        deceased   Lung cancer Maternal Aunt        smoker   Colon cancer Maternal Grandmother    Lung cancer Maternal Aunt        smoker   Prior to Admission medications   Medication Sig Start Date End Date Taking? Authorizing Provider  hydrochlorothiazide (HYDRODIURIL) 25 MG tablet Take 1 tablet (25 mg total) by mouth daily. 04/25/21  Yes McGowen, Maryjean Morn, MD  lisinopril (ZESTRIL) 20 MG tablet Take 1 tablet (20 mg total) by mouth daily. 03/30/21  Yes McGowen, Maryjean Morn, MD  metoprolol succinate (TOPROL-XL) 100 MG 24 hr tablet Take 1 tablet (100 mg total) by mouth daily with or immediately following a meal 04/25/21  Yes McGowen, Maryjean Morn, MD  norgestimate-ethinyl estradiol (ORTHO-CYCLEN) 0.25-35 MG-MCG tablet TAKE 1 TABLET BY MOUTH ONCE DAILY. 09/22/20 09/22/21 Yes Henreitta Leber, PA-C  Alcohol Swabs (ALCOHOL WIPES) 70 % PADS Use to clean finger before checking blood sugar, checks blood sugar 3-4 times daily 02/12/17   McGowen, Maryjean Morn, MD  ALPRAZolam Prudy Feeler) 1 MG tablet TAKE 2 TABLETS BY MOUTH IN THE MORNING & 2 TABLETS AT BEDTIME 04/25/21   McGowen, Maryjean Morn, MD  Calcium 250 MG CAPS Take 1 capsule by mouth daily.     [provider]  cyanocobalamin (,VITAMIN B-12,) 1000 MCG/ML injection INJECT 1 ML INTO THE MUSCLE EVERY 2 WEEKS 11/24/20 11/24/21  McGowen, Maryjean Morn, MD  DULoxetine (CYMBALTA) 20 MG capsule Take 1 capsule (20 mg total) by  mouth 2 (two) times daily. 02/20/21   Arfeen, Phillips Grout, MD  FEMYNOR 0.25-35 MG-MCG tablet TAKE 1 TABLET BY MOUTH ONCE DAILY. 07/31/20 07/31/21  McGowen, Maryjean Morn, MD  ferrous gluconate (FERGON) 324 MG tablet TAKE 1 TABLET (324 MG TOTAL) BY MOUTH DAILY WITH BREAKFAST. 01/04/21 01/04/22  McGowen, Maryjean Morn, MD  fluticasone (FLONASE) 50 MCG/ACT nasal spray PLACE 2 SPRAYS INTO BOTH NOSTRILS DAILY. 12/08/19   McGowen, Maryjean Morn, MD  gabapentin (NEURONTIN) 100 MG capsule TAKE 1 CAPSULE BY MOUTH THREE TIMES DAILY. 12/06/20 12/06/21  Su Hoff, PA-C  glucose blood (FREESTYLE LITE) test strip USE TO CHECK BLOOD SUGAR 3 TIMES A DAY 06/06/20   McGowen, Maryjean Morn, MD  lamoTRIgine (LAMICTAL) 150 MG tablet Take 1 tablet (150 mg total) by mouth daily. 02/20/21   Arfeen, Phillips GroutSyed T, MD  methocarbamol (ROBAXIN) 500 MG tablet Take 1 tablet (500 mg total) by mouth every 12 (twelve) hours as needed for muscle spasms. 03/05/21   Antony MaduraHumes, Kelly, PA-C  montelukast (SINGULAIR) 10 MG tablet Take 1 tablet (10 mg total) by mouth at bedtime. 01/26/20   McGowen, Maryjean MornPhilip H, MD  nitrofurantoin, macrocrystal-monohydrate, (MACROBID) 100 MG capsule Take 1 capsule (100 mg total) by mouth 2 (two) times daily after a meal for 5 days 04/19/21     nystatin-triamcinolone ointment (MYCOLOG) APPLY TO THE AFFECTED AREA(S) BY TOPICAL ROUTE 2 TIMES PER DAY AS NEEDED 04/23/21     omeprazole (PRILOSEC) 40 MG capsule TAKE 1 CAPSULE BY MOUTH TWO TIMES DAILY 06/22/20 06/22/21  Jeoffrey MassedMcGowen, Philip H, MD  oxyCODONE-acetaminophen (PERCOCET) 10-325 MG tablet TK 1 T PO QID PRN 05/31/19   [provider]  potassium chloride SA (K-DUR) 20 MEQ tablet TAKE 1 TABLET BY MOUTH DAILY. 04/05/19   McGowen, Maryjean MornPhilip H, MD  QUEtiapine (SEROQUEL) 100 MG tablet Take 1 tablet (100 mg total) by mouth at bedtime as needed for insomnia 01/17/21   McGowen, Maryjean MornPhilip H, MD  rizatriptan (MAXALT-MLT) 10 MG disintegrating tablet 1 tab po qd as needed for migraine headache.  May repeat dose in 2 hours if  needed.  Max dose in 24h is 20 mg. Patient taking differently: 1 tab po qd as needed for migraine headache.  May repeat dose in 2 hours if needed.  Max dose in 24h is 20 mg. 04/01/19   McGowen, Maryjean MornPhilip H, MD  Vitamin D, Ergocalciferol, (DRISDOL) 1.25 MG (50000 UNIT) CAPS capsule TAKE 1 CAPSULE BY MOUTH TWICE WEEKLY 05/18/20 07/18/21  Jeoffrey MassedMcGowen, Philip H, MD   Physical Exam: Vitals:   04/26/21 2012 04/26/21 2100 04/27/21 0016 04/27/21 0023  BP: (!) 89/73 (!) 105/55  128/67  Pulse: (!) 57 (!) 56  66  Resp: (!) 8 15  16   Temp: 98.3 F (36.8 C)   97.9 F (36.6 C)  TempSrc: Oral   Oral  SpO2: 100% 100%  100%  Weight:   123.1 kg   Height:   5\' 6"  (1.676 m)    Constitutional: NAD, calm, comfortable Eyes: PERRL, lids and conjunctivae normal ENMT: Mucous membranes are mildly dry. Posterior pharynx clear of any exudate or lesions. Neck: normal, supple, no masses, no thyromegaly Respiratory: clear to auscultation bilaterally, no wheezing, no crackles. Normal respiratory effort. No accessory muscle use.  Cardiovascular: Regular rate and rhythm, no murmurs / rubs / gallops. No extremity edema. 2+ pedal pulses. No carotid bruits.  Abdomen: Obese, surgical scars present.  BS (+) no tenderness, no masses palpated. No hepatosplenomegaly.  Musculoskeletal: no clubbing / cyanosis.  Good ROM, no contractures. Normal muscle tone.  Skin: no rashes, lesions, ulcers on very limited dermatological examination. Neurologic: CN 2-12 grossly intact. Sensation intact, DTR normal. Strength 5/5 in all 4.  Psychiatric: Normal judgment and insight. Alert and oriented x 3. Normal mood.   Labs on Admission: I have personally reviewed following labs and imaging studies  CBC: Recent Labs  Lab 04/26/21 1718  WBC 9.2  HGB 13.6  HCT 43.1  MCV 93.9  PLT 330    Basic Metabolic Panel: Recent Labs  Lab 04/26/21 1718  NA 132*  K 4.1  CL 101  CO2 19*  GLUCOSE 76  BUN 25*  CREATININE 2.12*  CALCIUM 8.4*     GFR:  Estimated Creatinine Clearance: 44.9 mL/min (A) (by C-G formula based on SCr of 2.12 mg/dL (H)).  Liver Function Tests: Recent Labs  Lab 04/26/21 1718  AST 11*  ALT <5  ALKPHOS 51  BILITOT 0.4  PROT 7.0  ALBUMIN 3.9   Urine analysis:    Component Value Date/Time   COLORURINE YELLOW 04/26/2021 1737   APPEARANCEUR CLEAR 04/26/2021 1737   LABSPEC 1.031 (H) 04/26/2021 1737   PHURINE 5.0 04/26/2021 1737   GLUCOSEU NEGATIVE 04/26/2021 1737        HGBUR NEGATIVE 04/26/2021 1737   BILIRUBINUR NEGATIVE 04/26/2021 1737        KETONESUR NEGATIVE 04/26/2021 1737   PROTEINUR TRACE (A) 04/26/2021 1737             NITRITE NEGATIVE 04/26/2021 1737   LEUKOCYTESUR NEGATIVE 04/26/2021 1737   Radiological Exams on Admission: No results found.  EKG: Independently reviewed.  Vent. rate 59 BPM PR interval 144 ms QRS duration 92 ms QT/QTcB 458/453 ms P-R-T axes 48 4 31 Sinus bradycardia  Assessment/Plan Principal Problem:    AKI (acute kidney injury) (HCC) In the setting of   Hypotension secondary to Volume depletion/hyponatremia due to increase HCTZ dose. Place in observation/telemetry. Continue IV fluids. Monitor intake and output. Hold antihypertensives. Follow blood pressure closely. Follow-up renal function electrolytes. BP already normalized/good urine output so far. May be able to go home later today or tomorrow morning.  Active Problems:   Hyponatremia Secondary to HCTZ use. Hold HCTZ and continue IV fluids.    Hypocalcemia History of vitamin D deficiency. Will recheck value along with albumin in the morning.    Esophageal reflux Continue daily PPI.    Class 3 obesity with BMI of 43.80 Advised to continue working closely with her PCP.    Anxiety and depression Continue duloxetine 20 mg p.o. twice daily. Continue Seroquel 100 mg p.o. bedtime as needed. Continue alprazolam 2 mg p.o. twice daily as needed. Follow-up with behavioral health and  PCP as an outpatient.   DVT prophylaxis: Lovenox SQ. Code Status:   Full code. Family Communication:   Disposition Plan:   Patient is from:  Home.  Anticipated DC to:  Home.  Anticipated DC date:  04/27/2021 or 04/28/2021.  Anticipated DC barriers: Clinical status.  Consults called:   Admission status:  Observation/telemetry.  Severity of Illness:  High severity after presenting with hypotension with resulting acute kidney injury in the setting of a recent adjustment in antihypertensive therapy.  The patient will need to remain in the hospital for 24 to 48 hours close monitoring and IV rehydration.  Bobette Mo MD [facility]  How to contact the [facility] Attending or Consulting provider 7A - 7P or covering provider during after hours 7P -7A, for this patient?   Check the care team in [facility] and look for a) attending/consulting [facility] provider listed and b) the [facility] team listed Log into [email or web address] and use [place]'s universal password to access. If you do not have the password, please contact the hospital operator. Locate the [facility] provider you are looking for under [facility] and page to a number that you can be directly reached. If you still have difficulty reaching the provider, please page the [facility] (Director on Call) for the Hospitalists listed on amion for assistance.  04/27/2021, 1:12 AM   This document was prepared using Dragon voice recognition software and may contain some unintended transcription errors.

## 2021-04-27 NOTE — Progress Notes (Signed)
Discharge instructions given. Patient verbalized understanding and all questions were answered.  ?

## 2021-05-01 ENCOUNTER — Telehealth: Payer: Self-pay

## 2021-05-01 ENCOUNTER — Other Ambulatory Visit (HOSPITAL_COMMUNITY): Payer: Self-pay

## 2021-05-01 ENCOUNTER — Other Ambulatory Visit: Payer: Self-pay | Admitting: Family Medicine

## 2021-05-01 ENCOUNTER — Other Ambulatory Visit: Payer: Self-pay

## 2021-05-01 NOTE — Telephone Encounter (Signed)
Pt refused to schedule ED follow up appt. Next scheduled follow up is 8/19.  Please advise, thanks.

## 2021-05-01 NOTE — Progress Notes (Signed)
A user error has taken place: encounter opened in error, closed for administrative reasons.

## 2021-05-01 NOTE — Telephone Encounter (Signed)
Patient calling in regards to her blood pressure and her meds.  "What regimen does Dr. Milinda Cave want me on now since my hospital visit?"  I tried to schedule appt with provider and she said she just needs to know what meds to be on.  Please call patient [phone].

## 2021-05-01 NOTE — Telephone Encounter (Signed)
Pls notify pt that I can't advise on bp meds b/c I need to know what her bp has been since the ED visit last week. Pls get details of last few days home bp and heart rate checks. If she has not been checking bp at home then she'll need to return for o/v and bp check and we can go from there.-thx

## 2021-05-01 NOTE — Telephone Encounter (Signed)
Spoke with pt regarding home bp checks, she only had 1 reading from the last few days able to provide due to error message on machine; 160/113. She has not been taking any of her bp meds since ED visit, only antidepressant, acid reflux medication. Would like to be seen this week but no available openings until next week.  Please advise, thanks.

## 2021-05-02 ENCOUNTER — Telehealth: Payer: Self-pay | Admitting: Skilled Nursing Facility1

## 2021-05-02 NOTE — Telephone Encounter (Signed)
Pt had some questions about calcium.  Dietitian answered pts questions to her satisfaction.

## 2021-05-02 NOTE — Telephone Encounter (Signed)
Restart lisinopril 20mg  qd. Send mychart message in 2d with bp's and HR's. Ok for f/u HTN o/v next week and I'll recheck kidney function at that visit.  Emphasize need to drink at least 60 oz fluids per day.

## 2021-05-02 NOTE — Telephone Encounter (Signed)
LM for pt to return call regarding recommendations.  

## 2021-05-02 NOTE — Telephone Encounter (Signed)
Spoke with patient regarding recommendations, voiced understanding. She had a concern regarding calcium level and would like this checked during next week's labs after OV. Appt scheduled for 8/4

## 2021-05-03 ENCOUNTER — Other Ambulatory Visit (HOSPITAL_COMMUNITY): Payer: Self-pay

## 2021-05-03 ENCOUNTER — Encounter: Payer: Self-pay | Admitting: Family Medicine

## 2021-05-03 MED ORDER — METRONIDAZOLE 500 MG PO TABS
500.0000 mg | ORAL_TABLET | Freq: Two times a day (BID) | ORAL | 0 refills | Status: AC
  Filled 2021-05-03: qty 14, 7d supply, fill #0

## 2021-05-03 MED ORDER — FLUCONAZOLE 150 MG PO TABS
150.0000 mg | ORAL_TABLET | ORAL | 0 refills | Status: DC
  Filled 2021-05-03: qty 2, 4d supply, fill #0

## 2021-05-03 NOTE — Telephone Encounter (Signed)
Please advise on referral

## 2021-05-07 DIAGNOSIS — M5416 Radiculopathy, lumbar region: Secondary | ICD-10-CM | POA: Insufficient documentation

## 2021-05-08 ENCOUNTER — Other Ambulatory Visit (HOSPITAL_COMMUNITY): Payer: Self-pay

## 2021-05-08 MED FILL — Omeprazole Cap Delayed Release 40 MG: ORAL | 90 days supply | Qty: 180 | Fill #0 | Status: AC

## 2021-05-10 ENCOUNTER — Other Ambulatory Visit (HOSPITAL_COMMUNITY): Payer: Self-pay

## 2021-05-10 ENCOUNTER — Encounter: Payer: Self-pay | Admitting: Family Medicine

## 2021-05-10 ENCOUNTER — Ambulatory Visit: Payer: 59 | Admitting: Family Medicine

## 2021-05-10 ENCOUNTER — Other Ambulatory Visit: Payer: Self-pay

## 2021-05-10 VITALS — BP 109/75 | HR 73 | Temp 98.7°F | Resp 16 | Ht 64.5 in | Wt 260.2 lb

## 2021-05-10 DIAGNOSIS — I9589 Other hypotension: Secondary | ICD-10-CM

## 2021-05-10 DIAGNOSIS — I10 Essential (primary) hypertension: Secondary | ICD-10-CM | POA: Diagnosis not present

## 2021-05-10 DIAGNOSIS — E161 Other hypoglycemia: Secondary | ICD-10-CM

## 2021-05-10 DIAGNOSIS — Z9884 Bariatric surgery status: Secondary | ICD-10-CM | POA: Diagnosis not present

## 2021-05-10 DIAGNOSIS — R7989 Other specified abnormal findings of blood chemistry: Secondary | ICD-10-CM | POA: Diagnosis not present

## 2021-05-10 DIAGNOSIS — E861 Hypovolemia: Secondary | ICD-10-CM | POA: Diagnosis not present

## 2021-05-10 DIAGNOSIS — E162 Hypoglycemia, unspecified: Secondary | ICD-10-CM | POA: Diagnosis not present

## 2021-05-10 DIAGNOSIS — E8809 Other disorders of plasma-protein metabolism, not elsewhere classified: Secondary | ICD-10-CM

## 2021-05-10 MED ORDER — OMEPRAZOLE 40 MG PO CPDR
40.0000 mg | DELAYED_RELEASE_CAPSULE | Freq: Two times a day (BID) | ORAL | 3 refills | Status: DC
  Filled 2021-05-10: qty 180, fill #0
  Filled 2021-07-30: qty 180, 90d supply, fill #0
  Filled 2021-10-09: qty 180, 90d supply, fill #1
  Filled 2021-12-26 – 2021-12-27 (×2): qty 60, 30d supply, fill #2
  Filled 2022-02-20: qty 60, 30d supply, fill #3
  Filled 2022-03-16: qty 60, 30d supply, fill #4
  Filled 2022-05-10: qty 60, 30d supply, fill #5

## 2021-05-10 NOTE — Progress Notes (Signed)
OFFICE VISIT  05/10/2021  CC:  Chief Complaint  Patient presents with   Follow-up    HTN,    HPI:    Patient is a 45 y.o.female who presents hosp f/u for recent hypotension d/t hypovolemia. Hx of reactive hypoglycemia with altered sensorium.  She had her roux-en Y bypass surg done back in 2003 and these sx's started about 12 yrs later.   04/26/21 ED encounter reviewed today and subsequent hospitalization from 7/21-7/22 records today. BUN 25, sCr 2.1 on admission, urine SG 1.030.  Albumin low at 2.8, total protein normal.  CBC and hepatic panel normal. With IVF her BUN and sCr the following day were 12 and 0.94 respectively. Her hctz, toprol, and lisinopril were held in hosp as well as at d/c home.  INTERIM HX: Doing ok at this time.  We discussed again her longstanding problem with postprandial symptomatic hypoglycemia. Within 1-2 hours of eating anything she gets nauseated, sweaty, tremulous, cognitive slowing, signif fatigue and glucoses are consistently between 50 and 80.  She eats in response to this (or takes glucose tabs) and sometimes this helps, also only brings glucose up sometimes.  She does not feel these sxs or have low sugar if she fasts a long time.  Eating feels full on small amounts of food, has to eat slow, occ nausea and has to vomit once.    Restarted lisinopril 20mg  qd last week, bp 120/80 with her wrist cuff.  Upper arm cuff reading higher b/c cuff too small. She says she hydrates well, denies orthostatic dizziness.  She last ate 2.5 hours ago, sausage patty, 1 egg, 1 piece toast, grits. No symptoms at this time.  ROS as above, plus--> no fevers, no CP, no SOB, no wheezing, no cough, no dizziness, no HAs, no rashes, no melena/hematochezia.  No polyuria or polydipsia.  No myalgias or arthralgias.  No focal weakness, paresthesias, or tremors.  No acute vision or hearing abnormalities.  No dysuria or unusual/new urinary urgency or frequency.  No recent changes in  lower legs. No n/v/d or abd pain.  No palpitations.   Of note she states her current menses has been prolonged for about a month. Her Hb was normal in hospital recently.  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  (Normal cervical MRI 2010 per ortho notes).   Easy bruisability 2011   Hx of: saw Hematologist (Dr. 2012) and w/u neg for von Willebrand desease.   GERD (gastroesophageal reflux disease)    hx gastric ulcer and upper GI bleed, ? 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 Pt to start FeSO4 325 qd 10/28/19   Irregular menses 03/2018   GYN started Femynor 0.25-0.35.  [facility] 04/2018 OB/GYN as of 10/2018.  u/s wnl.  Endom bx NEG. OCPs helped.   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 Everardo All   Seizure [facility]) 2015   s/p eval with neuro, no medication, no events since   Syncope    with ? seizure in 2015, eval with neruo   Vitamin B12 deficiency    Vitamin D deficiency     Past Surgical History:  Procedure Laterality Date   CESAREAN SECTION  2006   ELBOW X-RAY Left 12/07/2019   ENDOMETRIAL BIOPSY     EYE SURGERY Bilateral    2001   FINGER X-RAY Right 12/07/2019   ROUX-EN-Y GASTRIC BYPASS  01/05/2002   TONSILLECTOMY AND ADENOIDECTOMY     TUBAL LIGATION  2006   tubes in ears both Bilateral     Outpatient Medications Prior to Visit   Medication Sig Dispense Refill   Alcohol Swabs (ALCOHOL WIPES) 70 % PADS Use to clean finger before checking blood sugar, checks blood sugar 3-4 times daily 100 each 11   ALPRAZolam (XANAX) 1 MG tablet TAKE 2 TABLETS BY MOUTH IN THE MORNING & 2 TABLETS AT BEDTIME 120 tablet 5   Calcium 250 MG CAPS Take 1 capsule by mouth 3 (three) times daily.     cyanocobalamin (,VITAMIN B-12,) 1000 MCG/ML injection INJECT 1 ML INTO THE MUSCLE EVERY 2 WEEKS 10 mL 6   DULoxetine (CYMBALTA) 20 MG capsule Take 1 capsule (20 mg total) by mouth 2 (two) times daily. 180 capsule 0   Ensure Max Protein (ENSURE MAX PROTEIN) LIQD Take 11 oz by mouth daily as needed (low Glucose). Chocolate     ferrous gluconate (FERGON) 324 MG tablet TAKE 1 TABLET (324 MG TOTAL) BY MOUTH DAILY WITH BREAKFAST. 90 tablet 3   fluconazole (DIFLUCAN) 150 MG tablet Take 1 tablet (150 mg total) by mouth now, may repeat in 3 days if needed 2 tablet 0   fluticasone (FLONASE) 50 MCG/ACT nasal spray PLACE 2 SPRAYS INTO BOTH NOSTRILS DAILY. 16 g 6   gabapentin (NEURONTIN) 100 MG capsule TAKE 1 CAPSULE BY MOUTH THREE TIMES DAILY. 90 capsule 2   glucose blood (FREESTYLE LITE) test strip USE TO CHECK BLOOD SUGAR 3 TIMES A DAY 200 strip 12   lamoTRIgine (LAMICTAL) 150 MG tablet Take 1 tablet (150 mg total) by mouth daily. 90 tablet 0   methocarbamol (ROBAXIN) 500 MG tablet Take 1 tablet (500 mg total) by mouth every 12 (twelve) hours as needed for muscle spasms. 20 tablet 0   metroNIDAZOLE (FLAGYL) 500 MG tablet Take 1 tablet (500 mg total) by mouth 2 (two) times daily for 7 days. 14 tablet 0   montelukast (SINGULAIR) 10 MG tablet Take 1 tablet (10 mg total) by mouth at bedtime. 30 tablet 5   norgestimate-ethinyl estradiol (ORTHO-CYCLEN) 0.25-35 MG-MCG tablet TAKE 1 TABLET BY MOUTH ONCE DAILY. 84 tablet 4   nystatin-triamcinolone ointment (MYCOLOG) APPLY TO THE AFFECTED AREA(S) BY TOPICAL ROUTE 2 TIMES PER DAY AS NEEDED 30 g 0   oxyCODONE-acetaminophen  (PERCOCET) 10-325 MG tablet Take 1 tablet by mouth every 6 (six) hours as needed for pain.     potassium chloride SA (K-DUR) 20 MEQ tablet TAKE 1 TABLET BY MOUTH DAILY. 90 tablet 1   rizatriptan (MAXALT-MLT) 10 MG disintegrating tablet 1 tab po qd as needed for migraine headache.  May repeat dose in 2 hours if needed.  Max dose in 24h is 20 mg. 10 tablet 5   Vitamin D, Ergocalciferol, (DRISDOL) 1.25 MG (50000 UNIT) CAPS capsule TAKE 1 CAPSULE  BY MOUTH TWICE WEEKLY 24 capsule 3   omeprazole (PRILOSEC) 40 MG capsule TAKE 1 CAPSULE BY MOUTH TWO TIMES DAILY 180 capsule 3   No facility-administered medications prior to visit.    Allergies  Allergen Reactions   Ibuprofen     Causes ulcers    ROS As per HPI  PE: Vitals with BMI 05/10/2021 04/27/2021 04/27/2021  Height 5' 4.5" - -  Weight 260 lbs 3 oz - -  BMI 43.99 - -  Systolic 109 91 106  Diastolic 75 47 59  Pulse 73 75 -  Some encounter information is confidential and restricted. Go to Review Flowsheets activity to see all data.   Gen: Alert, well appearing.  Patient is oriented to person, place, time, and situation. AFFECT: pleasant, lucid thought and speech. GYB:WLSL: no injection, icteris, swelling, or exudate.  EOMI, PERRLA. Mouth: lips without lesion/swelling.  Oral mucosa pink and moist. Oropharynx without erythema, exudate, or swelling.  CV: RRR, no m/r/g.   LUNGS: CTA bilat, nonlabored resps, good aeration in all lung fields. EXT: no clubbing or cyanosis.  no edema.    LABS:    Chemistry      Component Value Date/Time   NA 137 04/27/2021 1135   K 3.9 04/27/2021 1135   CL 108 04/27/2021 1135   CO2 22 04/27/2021 1135   BUN 12 04/27/2021 1135   CREATININE 0.94 04/27/2021 1135      Component Value Date/Time   CALCIUM 8.2 (L) 04/27/2021 1135   ALKPHOS 51 04/26/2021 1718   AST 11 (L) 04/26/2021 1718   ALT <5 04/26/2021 1718   BILITOT 0.4 04/26/2021 1718     Lab Results  Component Value Date   WBC 9.2 04/26/2021    HGB 13.6 04/26/2021   HCT 43.1 04/26/2021   MCV 93.9 04/26/2021   PLT 330 04/26/2021   Lab Results  Component Value Date   IRON 74 11/24/2020   TIBC 445 11/24/2020   FERRITIN 11 (L) 11/24/2020   Lab Results  Component Value Date   VITAMINB12 766 11/24/2020   Lab Results  Component Value Date   HGBA1C 4.6 08/07/2017   IMPRESSION AND PLAN:  1) Hypotension, suspected d/t hypovolemia + antihypertensive meds. Improved with IVF and holding bp meds. Now back on lisinopril 20mg  qd and bp normal range. Lytes/cr today.  2) ARF/AKI: secondary to #1 above. Resolved with IVF and holding hctz and lisinopril. Will see how her lytes/cr are today.  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 This started to become an issue about 12 yrs after her surgery, which was done at [facility] in 2003.   She does not recall the surgeon's name and states she didn't get follow up---it is not clear why, though. She's seen a nutritionist recently to try to develop dietary/eating habit strategies to mitigate the symptoms/hypoglycemia.  Despite this pt states the episodes are gradually getting more frequent and has to miss work more.  She saw Dr. 2004 in endocrinology in 2018 and his impression was reactive hypoglycemia; no evidence of hypoglycemia or hyperinsulinism in the prolonged fasting state. She requests referral to gen surg locally to see if they have any recommendations from medical or surgical standpoint since this problem is presumably a consequence of her previous gastric bypass surgery. Obtaining random glucose today.  An After Visit Summary was printed and given to the patient.  FOLLOW UP: Return in about 3 weeks (around 05/31/2021) for f/u HTN.  Signed:  06/02/2021, MD  05/10/2021  

## 2021-05-11 LAB — COMPREHENSIVE METABOLIC PANEL
ALT: 7 U/L (ref 0–35)
AST: 11 U/L (ref 0–37)
Albumin: 3.9 g/dL (ref 3.5–5.2)
Alkaline Phosphatase: 45 U/L (ref 39–117)
BUN: 13 mg/dL (ref 6–23)
CO2: 26 mEq/L (ref 19–32)
Calcium: 9.4 mg/dL (ref 8.4–10.5)
Chloride: 100 mEq/L (ref 96–112)
Creatinine, Ser: 1.04 mg/dL (ref 0.40–1.20)
GFR: 64.89 mL/min (ref >60.00)
Glucose, Bld: 79 mg/dL (ref 70–99)
Potassium: 5.2 mEq/L — ABNORMAL HIGH (ref 3.5–5.1)
Sodium: 134 mEq/L — ABNORMAL LOW (ref 135–145)
Total Bilirubin: 0.4 mg/dL (ref 0.2–1.2)
Total Protein: 6.5 g/dL (ref 6.0–8.3)

## 2021-05-14 ENCOUNTER — Encounter: Payer: Self-pay | Admitting: Family Medicine

## 2021-05-14 ENCOUNTER — Other Ambulatory Visit (HOSPITAL_COMMUNITY): Payer: Self-pay

## 2021-05-14 MED ORDER — LISINOPRIL 20 MG PO TABS
20.0000 mg | ORAL_TABLET | Freq: Every day | ORAL | 3 refills | Status: DC
  Filled 2021-05-14: qty 90, 90d supply, fill #0
  Filled 2021-08-14: qty 90, 90d supply, fill #1
  Filled 2021-11-26: qty 90, 90d supply, fill #2
  Filled 2022-02-18: qty 30, 30d supply, fill #3
  Filled 2022-03-19 – 2022-03-28 (×2): qty 30, 30d supply, fill #4
  Filled 2022-04-12: qty 30, 30d supply, fill #5

## 2021-05-14 NOTE — Telephone Encounter (Signed)
Pt last seen 08/04, advised to f/u 3 weeks HTN. Lisinopril originally d/c 04/27/21.  Please advise if refill appropriate. Med pending

## 2021-05-21 ENCOUNTER — Encounter: Payer: Self-pay | Admitting: Family Medicine

## 2021-05-21 DIAGNOSIS — E161 Other hypoglycemia: Secondary | ICD-10-CM

## 2021-05-21 DIAGNOSIS — Z9884 Bariatric surgery status: Secondary | ICD-10-CM

## 2021-05-21 NOTE — Telephone Encounter (Signed)
Recommend pt make f/u with Dr. Everardo All or we can seek 2nd opinion from diff endocrinologist. Let me know.-thx

## 2021-05-22 ENCOUNTER — Other Ambulatory Visit (HOSPITAL_COMMUNITY): Payer: Self-pay

## 2021-05-22 ENCOUNTER — Other Ambulatory Visit (HOSPITAL_COMMUNITY): Payer: Self-pay | Admitting: *Deleted

## 2021-05-22 DIAGNOSIS — F429 Obsessive-compulsive disorder, unspecified: Secondary | ICD-10-CM

## 2021-05-22 DIAGNOSIS — F331 Major depressive disorder, recurrent, moderate: Secondary | ICD-10-CM

## 2021-05-22 DIAGNOSIS — F419 Anxiety disorder, unspecified: Secondary | ICD-10-CM

## 2021-05-22 MED ORDER — LAMOTRIGINE 150 MG PO TABS
150.0000 mg | ORAL_TABLET | Freq: Every day | ORAL | 0 refills | Status: DC
  Filled 2021-05-22: qty 30, 30d supply, fill #0

## 2021-05-22 MED ORDER — DULOXETINE HCL 20 MG PO CPEP
20.0000 mg | ORAL_CAPSULE | Freq: Two times a day (BID) | ORAL | 0 refills | Status: DC
  Filled 2021-05-22: qty 60, 30d supply, fill #0

## 2021-05-23 ENCOUNTER — Other Ambulatory Visit (HOSPITAL_COMMUNITY): Payer: Self-pay

## 2021-05-24 ENCOUNTER — Other Ambulatory Visit (HOSPITAL_COMMUNITY): Payer: Self-pay

## 2021-05-24 ENCOUNTER — Telehealth (HOSPITAL_COMMUNITY): Payer: 59 | Admitting: Psychiatry

## 2021-05-24 NOTE — Telephone Encounter (Signed)
Okay, referral to Dr. Everardo All ordered. Her blood pressures are exactly where we want them.

## 2021-05-24 NOTE — Telephone Encounter (Signed)
Please advise if new referral is okay

## 2021-05-25 ENCOUNTER — Ambulatory Visit: Payer: 59 | Admitting: Family Medicine

## 2021-05-28 ENCOUNTER — Other Ambulatory Visit (HOSPITAL_COMMUNITY): Payer: Self-pay

## 2021-05-28 ENCOUNTER — Encounter: Payer: Self-pay | Admitting: Family Medicine

## 2021-05-28 MED ORDER — GABAPENTIN 100 MG PO CAPS
100.0000 mg | ORAL_CAPSULE | Freq: Three times a day (TID) | ORAL | 1 refills | Status: DC
  Filled 2021-05-28: qty 90, 30d supply, fill #0

## 2021-06-01 ENCOUNTER — Ambulatory Visit: Payer: 59 | Admitting: Family Medicine

## 2021-06-08 ENCOUNTER — Other Ambulatory Visit: Payer: Self-pay

## 2021-06-08 ENCOUNTER — Ambulatory Visit: Payer: 59 | Admitting: Family Medicine

## 2021-06-08 DIAGNOSIS — E161 Other hypoglycemia: Secondary | ICD-10-CM

## 2021-06-08 DIAGNOSIS — I10 Essential (primary) hypertension: Secondary | ICD-10-CM

## 2021-06-08 DIAGNOSIS — K909 Intestinal malabsorption, unspecified: Secondary | ICD-10-CM

## 2021-06-08 NOTE — Progress Notes (Deleted)
OFFICE VISIT  06/08/2021  CC: No chief complaint on file.   HPI:    Patient is a 45 y.o. female who presents for f/u HTN, reactive hypoglycemia, and malabsorption syndrome (remote hx of roux-en-y bipass). I last saw her 1 month ago. A/P as of last visit: "1) Hypotension, suspected d/t hypovolemia + antihypertensive meds. Improved with IVF and holding bp meds. Now back on lisinopril 20mg  qd and bp normal range. Lytes/cr today.   2) ARF/AKI: secondary to #1 above. Resolved with IVF and holding hctz and lisinopril. Will see how her lytes/cr are today.   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 This started to become an issue about 12 yrs after her surgery, which was done at [facility] in 2003.   She does not recall the surgeon's name and states she didn't get follow up---it is not clear why, though. She's seen a nutritionist recently to try to develop dietary/eating habit strategies to mitigate the symptoms/hypoglycemia.  Despite this pt states the episodes are gradually getting more frequent and has to miss work more.  She saw Dr. 2004 in endocrinology in 2018 and his impression was reactive hypoglycemia; no evidence of hypoglycemia or hyperinsulinism in the prolonged fasting state. She requests referral to gen surg locally to see if they have any recommendations from medical or surgical standpoint since this problem is presumably a consequence of her previous gastric bypass surgery. Obtaining random glucose today."  INTERIM HX: ***  Nonfasting glucose was 79 at last visit 1 mo ago.   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  (Normal cervical MRI 2010 per ortho notes).   Easy  bruisability 2011   Hx of: saw Hematologist (Dr. 2012) and w/u neg for von Willebrand desease.   GERD (gastroesophageal reflux disease)    hx gastric ulcer and upper GI bleed, ? 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 Pt to start FeSO4 325 qd 10/28/19   Irregular menses 03/2018   GYN started Femynor 0.25-0.35.  [facility] 04/2018 OB/GYN as of 10/2018.  u/s wnl.  Endom bx NEG. OCPs helped.   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 01/2020   Seizure [facility]) 2015   s/p eval with neuro, no medication, no events since   Syncope    with ? seizure in 2015, eval with neruo   Vitamin B12 deficiency    Vitamin D deficiency     Past Surgical History:  Procedure Laterality Date   CESAREAN SECTION  2006   ELBOW X-RAY Left 12/07/2019   ENDOMETRIAL BIOPSY     EYE SURGERY Bilateral    2001   FINGER X-RAY Right 12/07/2019   ROUX-EN-Y GASTRIC BYPASS  01/05/2002   TONSILLECTOMY AND ADENOIDECTOMY     TUBAL  LIGATION  2006   tubes in ears both Bilateral     Outpatient Medications Prior to Visit  Medication Sig Dispense Refill   Alcohol Swabs (ALCOHOL WIPES) 70 % PADS Use to clean finger before checking blood sugar, checks blood sugar 3-4 times daily 100 each 11   ALPRAZolam (XANAX) 1 MG tablet TAKE 2 TABLETS BY MOUTH IN THE MORNING & 2 TABLETS AT BEDTIME 120 tablet 5   Calcium 250 MG CAPS Take 1 capsule by mouth 3 (three) times daily.     cyanocobalamin (,VITAMIN B-12,) 1000 MCG/ML injection INJECT 1 ML INTO THE MUSCLE EVERY 2 WEEKS 10 mL 6   DULoxetine (CYMBALTA) 20 MG capsule Take 1 capsule (20 mg total) by mouth 2 (two) times daily. 64  capsule 0   Ensure Max Protein (ENSURE MAX PROTEIN) LIQD Take 11 oz by mouth daily as needed (low Glucose). Chocolate     ferrous gluconate (FERGON) 324 MG tablet TAKE 1 TABLET (324 MG TOTAL) BY MOUTH DAILY WITH BREAKFAST. 90 tablet 3   fluconazole (DIFLUCAN) 150 MG tablet Take 1 tablet (150 mg total) by mouth now, may repeat in 3 days if needed 2 tablet 0   fluticasone (FLONASE) 50 MCG/ACT nasal spray PLACE 2 SPRAYS INTO BOTH NOSTRILS DAILY. 16 g 6   gabapentin (NEURONTIN) 100 MG capsule TAKE 1 CAPSULE BY MOUTH THREE TIMES DAILY. 90 capsule 2   gabapentin (NEURONTIN) 100 MG capsule Take 1 capsule (100 mg total) by mouth 3 (three) times daily. 90 capsule 1   glucose blood (FREESTYLE LITE) test strip USE TO CHECK BLOOD SUGAR 3 TIMES A DAY 200 strip 12   lamoTRIgine (LAMICTAL) 150 MG tablet Take 1 tablet (150 mg total) by mouth daily. 33 tablet 0   lisinopril (ZESTRIL) 20 MG tablet Take 1 tablet (20 mg total) by mouth daily. 90 tablet 3   methocarbamol (ROBAXIN) 500 MG tablet Take 1 tablet (500 mg total) by mouth every 12 (twelve) hours as needed for muscle spasms. 20 tablet 0   montelukast (SINGULAIR) 10 MG tablet Take 1 tablet (10 mg total) by mouth at bedtime. 30 tablet 5   norgestimate-ethinyl estradiol (ORTHO-CYCLEN) 0.25-35 MG-MCG tablet TAKE 1 TABLET BY MOUTH ONCE DAILY. 84 tablet 4   nystatin-triamcinolone ointment (MYCOLOG) APPLY TO THE AFFECTED AREA(S) BY TOPICAL ROUTE 2 TIMES PER DAY AS NEEDED 30 g 0   omeprazole (PRILOSEC) 40 MG capsule Take 1 capsule (40 mg total) by mouth 2 (two) times daily. 180 capsule 3   oxyCODONE-acetaminophen (PERCOCET) 10-325 MG tablet Take 1 tablet by mouth every 6 (six) hours as needed for pain.     potassium chloride SA (K-DUR) 20 MEQ tablet TAKE 1 TABLET BY MOUTH DAILY. 90 tablet 1   rizatriptan (MAXALT-MLT) 10 MG disintegrating tablet 1 tab po qd as needed for migraine headache.  May repeat dose in 2 hours if needed.  Max dose in 24h is 20 mg. 10 tablet 5    Vitamin D, Ergocalciferol, (DRISDOL) 1.25 MG (50000 UNIT) CAPS capsule TAKE 1 CAPSULE BY MOUTH TWICE WEEKLY 24 capsule 3   No facility-administered medications prior to visit.    Allergies  Allergen Reactions   Ibuprofen     Causes ulcers    ROS As per HPI  PE: Vitals with BMI 05/10/2021 04/27/2021 04/27/2021  Height 5' 4.5" - -  Weight 260 lbs 3 oz - -  BMI 43.99 - -  Systolic 109 91 106  Diastolic 75 47 59  Pulse 73 75 -  Some encounter information is confidential and restricted. Go to Review Flowsheets activity to see all data.   ***  LABS:  Lab Results  Component Value Date   TSH 2.15 11/24/2020   Lab Results  Component Value Date   WBC 9.2 04/26/2021   HGB 13.6 04/26/2021   HCT 43.1 04/26/2021   MCV 93.9 04/26/2021   PLT 330 04/26/2021   Lab Results  Component Value Date   CREATININE 1.04 05/10/2021   BUN 13 05/10/2021   NA 134 (L) 05/10/2021   K 5.2 No hemolysis seen (H) 05/10/2021   CL 100 05/10/2021   CO2 26 05/10/2021   Lab Results  Component Value Date   ALT 7 05/10/2021   AST 11 05/10/2021   ALKPHOS 45 05/10/2021   BILITOT 0.4 05/10/2021   Lab Results  Component Value Date   CHOL 223 (H) 11/24/2020   Lab Results  Component Value Date   HDL 72.80 11/24/2020   Lab Results  Component Value Date   LDLCALC 89 02/06/2018   Lab Results  Component Value Date   TRIG 329.0 (H) 11/24/2020   Lab Results  Component Value Date   CHOLHDL 3 11/24/2020   Lab Results  Component Value Date   VITAMINB12 766 11/24/2020   Lab Results  Component Value Date   IRON 74 11/24/2020   TIBC 445 11/24/2020   FERRITIN 11 (L) 11/24/2020   Vit D level on 11/25/19=41  Lab Results  Component Value Date   HGBA1C 4.6 08/07/2017   IMPRESSION AND PLAN:  No problem-specific Assessment & Plan notes found for this encounter.   An After Visit Summary was printed and given to the patient.  FOLLOW UP: No follow-ups on file. Next cpe 11/2021  Signed:  Santiago Bumpers, MD           06/08/2021

## 2021-06-21 ENCOUNTER — Ambulatory Visit: Payer: 59 | Admitting: Family Medicine

## 2021-06-21 ENCOUNTER — Other Ambulatory Visit: Payer: Self-pay

## 2021-06-21 ENCOUNTER — Encounter: Payer: Self-pay | Admitting: Family Medicine

## 2021-06-21 ENCOUNTER — Telehealth: Payer: Self-pay

## 2021-06-21 VITALS — BP 129/85 | HR 75 | Temp 97.8°F | Ht 64.5 in | Wt 269.8 lb

## 2021-06-21 DIAGNOSIS — E876 Hypokalemia: Secondary | ICD-10-CM

## 2021-06-21 DIAGNOSIS — F411 Generalized anxiety disorder: Secondary | ICD-10-CM | POA: Diagnosis not present

## 2021-06-21 DIAGNOSIS — K909 Intestinal malabsorption, unspecified: Secondary | ICD-10-CM

## 2021-06-21 DIAGNOSIS — I10 Essential (primary) hypertension: Secondary | ICD-10-CM

## 2021-06-21 DIAGNOSIS — E161 Other hypoglycemia: Secondary | ICD-10-CM | POA: Diagnosis not present

## 2021-06-21 NOTE — Progress Notes (Signed)
OFFICE VISIT  06/21/2021  CC:  Chief Complaint  Toni [name] presents with   Follow-up    RCI   HPI:    Toni [name] is a 45 y.o. female who presents for f/u HTN, reactive hypoglycemia, chronic anxiety (with high risk med use), malabsorption syndrome d/t roux-en-Y bipass surgery. A/P as of last visit 6 wks ago: "1) Hypotension, suspected d/t hypovolemia + antihypertensive meds. Improved with IVF and holding bp meds. Now back on lisinopril 20mg  qd and bp normal range. Lytes/cr today.   2) ARF/AKI: secondary to #1 above. Resolved with IVF and holding hctz and lisinopril. Will see how her lytes/cr are today.   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 This started to become an issue about 12 yrs after her surgery, which was done at [facility] in 2003.   She does not recall the surgeon's name and states she didn't get follow up---it is not clear why, though. She's seen a nutritionist recently to try to develop dietary/eating habit strategies to mitigate the symptoms/hypoglycemia.  Despite this pt states the episodes are gradually getting more frequent and has to miss work more.  She saw Dr. 2004 in endocrinology in 2018 and his impression was reactive hypoglycemia; no evidence of hypoglycemia or hyperinsulinism in the prolonged fasting state. She requests referral to gen surg locally to see if they have any recommendations from medical or surgical standpoint since this problem is presumably a consequence of her previous gastric bypass surgery. Obtaining random glucose today."  INTERIM HX: She's been stable, still dealing with same postprandial/reactive hypoglycemia, particularly when eats carb-laden meal or eats too fast (she tries to avoid doing this). Feeling fatigued currently b/c she had an episode shortly before getting here today: ate french fries in a hurry->vomited. Says endo office never called her to schedule appt-->order entered for referral 05/24/21.  Potassium  a little elevated on last labs 5 wks ago, advised her to stop her K supp. Home bp's good: 118-135/74-88.  PMP AWARE reviewed today: most recent rx for alprazolam was filled 05/24/21, # 120, rx by me. She gets oxycodone rx'd through her ortho office. No red flags.   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  (Normal cervical MRI 2010 per ortho notes).   Easy bruisability 2011   Hx of: saw Hematologist (Dr. 2012) and w/u neg for von Willebrand desease.   GERD (gastroesophageal reflux disease)    hx gastric ulcer and upper GI bleed, ? 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 Pt to start FeSO4 325 qd 10/28/19   Irregular menses 03/2018   GYN started [facility] 0.25-0.35.  [facility] 04/2018 [facility]/GYN as of 10/2018.  u/s wnl.  Endom bx NEG. OCPs helped.   Malabsorption syndrome    due to roux-en-Y   Medial epicondylitis of elbow, left 12/2019   [facility]   Migraine    Osteoarthritis of carpometacarpal [facility]) joint of right thumb 12/2019   [facility]   Reactive hypoglycemia  Dr. Everardo All   Seizure [facility]) 2015   s/p eval with neuro, no medication, no events since   Syncope    with ? seizure in 2015, eval with neruo   Vitamin B12 deficiency    Vitamin D deficiency     Past Surgical History:  Procedure Laterality Date   CESAREAN SECTION  2006   ELBOW X-RAY Left 12/07/2019   ENDOMETRIAL BIOPSY     EYE SURGERY Bilateral    2001   FINGER X-RAY Right 12/07/2019   ROUX-EN-Y GASTRIC BYPASS  01/05/2002   TONSILLECTOMY AND ADENOIDECTOMY      TUBAL LIGATION  2006   tubes in ears both Bilateral     Outpatient Medications Prior to Visit  Medication Sig Dispense Refill   Alcohol Swabs (ALCOHOL WIPES) 70 % PADS Use to clean finger before checking blood sugar, checks blood sugar 3-4 times daily 100 each 11   ALPRAZolam (XANAX) 1 MG tablet TAKE 2 TABLETS BY MOUTH IN THE MORNING & 2 TABLETS AT BEDTIME 120 tablet 5   Calcium 250 MG CAPS Take 1 capsule by mouth 3 (three) times daily.     cyanocobalamin (,VITAMIN B-12,) 1000 MCG/ML injection INJECT 1 ML INTO THE MUSCLE EVERY 2 WEEKS 10 mL 6   DULoxetine (CYMBALTA) 20 MG capsule Take 1 capsule (20 mg total) by mouth 2 (two) times daily. 64 capsule 0   Ensure Max Protein (ENSURE MAX PROTEIN) LIQD Take 11 oz by mouth daily as needed (low Glucose). Chocolate     ferrous gluconate (FERGON) 324 MG tablet TAKE 1 TABLET (324 MG TOTAL) BY MOUTH DAILY WITH BREAKFAST. 90 tablet 3   fluticasone (FLONASE) 50 MCG/ACT nasal spray PLACE 2 SPRAYS INTO BOTH NOSTRILS DAILY. 16 g 6   gabapentin (NEURONTIN) 100 MG capsule TAKE 1 CAPSULE BY MOUTH THREE TIMES DAILY. 90 capsule 2   glucose blood (FREESTYLE LITE) test strip USE TO CHECK BLOOD SUGAR 3 TIMES A DAY 200 strip 12   lamoTRIgine (LAMICTAL) 150 MG tablet Take 1 tablet (150 mg total) by mouth daily. 33 tablet 0   lisinopril (ZESTRIL) 20 MG tablet Take 1 tablet (20 mg total) by mouth daily. 90 tablet 3   methocarbamol (ROBAXIN) 500 MG tablet Take 1 tablet (500 mg total) by mouth every 12 (twelve) hours as needed for muscle spasms. 20 tablet 0   montelukast (SINGULAIR) 10 MG tablet Take 1 tablet (10 mg total) by mouth at bedtime. 30 tablet 5   norgestimate-ethinyl estradiol (ORTHO-CYCLEN) 0.25-35 MG-MCG tablet TAKE 1 TABLET BY MOUTH ONCE DAILY. 84 tablet 4   nystatin-triamcinolone ointment (MYCOLOG) APPLY TO THE AFFECTED AREA(S) BY TOPICAL ROUTE 2 TIMES PER DAY AS NEEDED 30 g 0   omeprazole (PRILOSEC) 40 MG capsule Take 1 capsule (40 mg total) by mouth 2 (two)  times daily. 180 capsule 3   oxyCODONE-acetaminophen (PERCOCET) 10-325 MG tablet Take 1 tablet by mouth every 6 (six) hours as needed for pain.     potassium chloride SA (K-DUR) 20 MEQ tablet TAKE 1 TABLET BY MOUTH DAILY. 90 tablet 1   rizatriptan (MAXALT-MLT) 10 MG disintegrating tablet 1 tab po qd as needed for migraine headache.  May repeat dose in 2 hours if needed.  Max dose in 24h is 20 mg. 10 tablet 5   Vitamin D, Ergocalciferol, (DRISDOL) 1.25 MG (50000 UNIT) CAPS capsule TAKE 1 CAPSULE BY MOUTH TWICE WEEKLY 24 capsule 3   fluconazole (DIFLUCAN) 150 MG tablet Take 1 tablet (150 mg total) by mouth now,  may repeat in 3 days if needed 2 tablet 0   gabapentin (NEURONTIN) 100 MG capsule Take 1 capsule (100 mg total) by mouth 3 (three) times daily. 90 capsule 1   No facility-administered medications prior to visit.    Allergies  Allergen Reactions   Ibuprofen     Causes ulcers    ROS As per HPI  PE: Vitals with BMI 06/21/2021 05/10/2021 04/27/2021  Height 5' 4.5" 5' 4.5" -  Weight 269 lbs 13 oz 260 lbs 3 oz -  BMI 45.61 43.99 -  Systolic 129 109 91  Diastolic 85 75 47  Pulse 75 73 75  Some encounter information is confidential and restricted. Go to Review Flowsheets activity to see all data.    Gen: Alert, well appearing.  Toni [name] is oriented to person, place, time, and situation. AFFECT: pleasant, lucid thought and speech. CV: RRR, no m/r/g.   LUNGS: CTA bilat, nonlabored resps, good aeration in all lung fields. EXT: no clubbing or cyanosis.  no edema.    LABS:  Lab Results  Component Value Date   TSH 2.15 11/24/2020   Lab Results  Component Value Date   WBC 9.2 04/26/2021   HGB 13.6 04/26/2021   HCT 43.1 04/26/2021   MCV 93.9 04/26/2021   PLT 330 04/26/2021   Lab Results  Component Value Date   IRON 74 11/24/2020   TIBC 445 11/24/2020   FERRITIN 11 (L) 11/24/2020   Lab Results  Component Value Date   VITAMINB12 766 11/24/2020   Lab Results  Component  Value Date   CREATININE 1.04 05/10/2021   BUN 13 05/10/2021   NA 134 (L) 05/10/2021   K 5.2 No hemolysis seen (H) 05/10/2021   CL 100 05/10/2021   CO2 26 05/10/2021   Lab Results  Component Value Date   ALT 7 05/10/2021   AST 11 05/10/2021   ALKPHOS 45 05/10/2021   BILITOT 0.4 05/10/2021   Lab Results  Component Value Date   CHOL 223 (H) 11/24/2020   Lab Results  Component Value Date   HDL 72.80 11/24/2020   Lab Results  Component Value Date   LDLCALC 89 02/06/2018   Lab Results  Component Value Date   TRIG 329.0 (H) 11/24/2020   Lab Results  Component Value Date   CHOLHDL 3 11/24/2020   Lab Results  Component Value Date   HGBA1C 4.6 08/07/2017   Vit D level 11/24/20 was 41  IMPRESSION AND PLAN:  1) HTN, well controlled on lisinopril 20mg  qd. Mild hyperkal last visit when on K supp.  Supplement stopped. Recheck bmet today.  2) Reactive hypoglycemia +malabsorption syndrome.  Both d/t her hx of roux-en-y gastric bipass surgery in remote past. We'll check on status of endo referral (re-establish with Dr. ). Glucose today: she had a episode on the way over today. Cont small meals frequently and eat slow, avoid high carbs. Vit D, vit B12, and iron levels today.   Cont supplements for these. Hb 2 mo ago was 13.6.  3) GAD: doing well on xanax 2mg  bid. She gets the remainder of her psych meds managed by Dr. Everardo All.  An After Visit Summary was printed and given to the Toni [name].  FOLLOW UP: Return in about 3 months (around 09/20/2021) for routine chronic illness f/u. Cpe 11/2021  Signed:  09/22/2021, MD           06/21/2021

## 2021-06-21 NOTE — Telephone Encounter (Signed)
Pt had a referral placed with endocrinology on 05/24/21 for Dr.Ellison and has not received a call from their office at all. Per provider request, are you able to assist or should we enter new referral?

## 2021-06-22 LAB — IRON,TIBC AND FERRITIN PANEL
%SAT: 6 % (calc) — ABNORMAL LOW (ref 16–45)
Ferritin: 4 ng/mL — ABNORMAL LOW (ref 16–232)
Iron: 28 ug/dL — ABNORMAL LOW (ref 40–190)
TIBC: 459 mcg/dL (calc) — ABNORMAL HIGH (ref 250–450)

## 2021-06-22 LAB — BASIC METABOLIC PANEL
BUN: 13 mg/dL (ref 7–25)
CO2: 26 mmol/L (ref 20–32)
Calcium: 9.4 mg/dL (ref 8.6–10.2)
Chloride: 104 mmol/L (ref 98–110)
Creat: 0.71 mg/dL (ref 0.50–0.99)
Glucose, Bld: 86 mg/dL (ref 65–99)
Potassium: 5 mmol/L (ref 3.5–5.3)
Sodium: 138 mmol/L (ref 135–146)

## 2021-06-22 LAB — VITAMIN B12: Vitamin B-12: >2000 pg/mL — ABNORMAL HIGH (ref 200–1100)

## 2021-06-22 LAB — VITAMIN D 25 HYDROXY (VIT D DEFICIENCY, FRACTURES): Vit D, 25-Hydroxy: 52 ng/mL (ref 30–100)

## 2021-06-22 NOTE — Telephone Encounter (Signed)
MyChart message read.

## 2021-06-25 ENCOUNTER — Other Ambulatory Visit (HOSPITAL_COMMUNITY): Payer: Self-pay

## 2021-06-25 ENCOUNTER — Other Ambulatory Visit: Payer: Self-pay

## 2021-06-25 ENCOUNTER — Telehealth (INDEPENDENT_AMBULATORY_CARE_PROVIDER_SITE_OTHER): Payer: 59 | Admitting: Psychiatry

## 2021-06-25 ENCOUNTER — Encounter (HOSPITAL_COMMUNITY): Payer: Self-pay | Admitting: Psychiatry

## 2021-06-25 DIAGNOSIS — F331 Major depressive disorder, recurrent, moderate: Secondary | ICD-10-CM

## 2021-06-25 DIAGNOSIS — F429 Obsessive-compulsive disorder, unspecified: Secondary | ICD-10-CM

## 2021-06-25 DIAGNOSIS — F419 Anxiety disorder, unspecified: Secondary | ICD-10-CM

## 2021-06-25 MED ORDER — DULOXETINE HCL 20 MG PO CPEP
20.0000 mg | ORAL_CAPSULE | Freq: Two times a day (BID) | ORAL | 0 refills | Status: DC
Start: 2021-06-25 — End: 2021-09-10
  Filled 2021-06-25: qty 180, 90d supply, fill #0

## 2021-06-25 MED ORDER — LAMOTRIGINE 150 MG PO TABS
150.0000 mg | ORAL_TABLET | Freq: Every day | ORAL | 0 refills | Status: DC
  Filled 2021-06-25: qty 90, 90d supply, fill #0

## 2021-06-25 MED FILL — Norgestimate & Ethinyl Estradiol Tab 0.25 MG-35 MCG: ORAL | 84 days supply | Qty: 84 | Fill #0 | Status: CN

## 2021-06-25 MED FILL — Norgestimate & Ethinyl Estradiol Tab 0.25 MG-35 MCG: ORAL | 84 days supply | Qty: 84 | Fill #0 | Status: AC

## 2021-06-25 NOTE — Progress Notes (Signed)
Virtual Visit via Telephone Note  I connected with Toni [name] on 06/25/21 at  4:20 PM EDT by telephone and verified that I am speaking with the correct person using two identifiers.  Location: Patient: Home Provider: Home Office   I discussed the limitations, risks, security and privacy concerns of performing an evaluation and management service by telephone and the availability of in person appointments. I also discussed with the patient that there may be a patient responsible charge related to this service. The patient expressed understanding and agreed to proceed.   History of Present Illness: Patient is evaluated by phone session.  She admitted increased stress because she is now moved in with her mother has not able to pay the rent on her own.  She is hoping to find a part-time job so she can afford the rent and move out as soon as possible.  She admitted working under stressful environment but did not specify the details.  Her job is also stressful but overall she feels the combination of Lamictal and Cymbalta helping her depression, OCD and anxiety.  She was also admitted in the hospital because of hypotension and some of her medication has been discontinued.  She takes Xanax prescribed by PCP which is helping her sleep and anxiety.  We have tried doxepin but after giving 1 month she has not seen any significant improvement and she decided to stop.  Patient told her long-term goal is to come off from the medication but currently she feels she needs to be on medication.  Her obsessive thoughts are stable.  She denies any major panic attack.  She does not want any new medication at this time.  Her appetite is okay.  Since she, from Seroquel she had lost weight and happy about it.  She has no rash or any itching.  Past Psychiatric History: H/O depression after got married.  Tried Zoloft for years but stopped after seizure. PCP tried trazodone, Ambien, Doxepin and Seroquel but  did not work.  We  tried higher dose of Cymbalta but developed side effects.  H/O irritability, poor impulse control with excessive buying and shopping but no history of mania, psychosis, suicidal attempt or inpatient treatment.  Psychiatric Specialty Exam: Physical Exam  Review of Systems  Weight 269 lb (122 kg).There is no height or weight on file to calculate BMI.  General Appearance: NA  Eye Contact:  NA  Speech:  Slow  Volume:  Decreased  Mood:  Anxious  Affect:  NA  Thought Process:  Goal Directed  Orientation:  Full (Time, Place, and Person)  Thought Content:  Obsessions  Suicidal Thoughts:  No  Homicidal Thoughts:  No  Memory:  Immediate;   Good Recent;   Good Remote;   Good  Judgement:  Intact  Insight:  Present  Psychomotor Activity:  NA  Concentration:  Concentration: Good and Attention Span: Good  Recall:  Good  Fund of Knowledge:  Good  Language:  Good  Akathisia:  No  Handed:  Right  AIMS (if indicated):     Assets:  Communication Skills Desire for Improvement Housing Talents/Skills Transportation  ADL's:  Intact  Cognition:  WNL  Sleep:   5-6 hrs      Assessment and Plan: Major depressive disorder, recurrent.  Anxiety.  OCD.  I reviewed blood work results from recent hospitalization.  Her labs are stable.  Patient does not want any new medication and like to keep the Cymbalta and Lamictal.  She is hoping moving  back to her own place very soon when she started part-time additional work.  She had not establish therapy with Denyse Amass at this point but understands if symptoms get worse then she will call for appointment.  Continue Cymbalta 20 mg twice a day and lamotrigine 150 mg daily.  She is getting Xanax from her PCP.  Recommended to call us back if she has any question or any concern.  Follow-up in 3 months.  Follow Up Instructions:    I discussed the assessment and treatment plan with the patient. The patient was provided an opportunity to ask questions and all were  answered. The patient agreed with the plan and demonstrated an understanding of the instructions.   The patient was advised to call back or seek an in-person evaluation if the symptoms worsen or if the condition fails to improve as anticipated.  I provided 16 minutes of non-face-to-face time during this encounter.   Cleotis Nipper, MD

## 2021-06-26 ENCOUNTER — Other Ambulatory Visit (HOSPITAL_COMMUNITY): Payer: Self-pay

## 2021-06-26 MED ORDER — GABAPENTIN 100 MG PO CAPS
100.0000 mg | ORAL_CAPSULE | Freq: Three times a day (TID) | ORAL | 2 refills | Status: DC
  Filled 2021-06-26: qty 90, 30d supply, fill #0
  Filled 2021-07-24: qty 90, 30d supply, fill #1
  Filled 2021-08-21: qty 90, 30d supply, fill #2

## 2021-06-28 ENCOUNTER — Encounter: Payer: Self-pay | Admitting: Family Medicine

## 2021-06-29 NOTE — Telephone Encounter (Signed)
Letter printed and put on Toni [name]'s desk.

## 2021-07-04 ENCOUNTER — Other Ambulatory Visit (HOSPITAL_COMMUNITY): Payer: Self-pay

## 2021-07-24 ENCOUNTER — Other Ambulatory Visit (HOSPITAL_COMMUNITY): Payer: Self-pay

## 2021-07-30 ENCOUNTER — Other Ambulatory Visit (HOSPITAL_COMMUNITY): Payer: Self-pay

## 2021-08-13 ENCOUNTER — Other Ambulatory Visit (HOSPITAL_COMMUNITY): Payer: Self-pay

## 2021-08-13 MED FILL — Cyanocobalamin Inj 1000 MCG/ML: INTRAMUSCULAR | 84 days supply | Qty: 6 | Fill #2 | Status: AC

## 2021-08-14 ENCOUNTER — Other Ambulatory Visit (HOSPITAL_COMMUNITY): Payer: Self-pay

## 2021-08-16 DIAGNOSIS — G894 Chronic pain syndrome: Secondary | ICD-10-CM | POA: Diagnosis not present

## 2021-08-16 DIAGNOSIS — M545 Low back pain, unspecified: Secondary | ICD-10-CM | POA: Diagnosis not present

## 2021-08-20 ENCOUNTER — Other Ambulatory Visit (HOSPITAL_COMMUNITY): Payer: Self-pay

## 2021-08-21 ENCOUNTER — Other Ambulatory Visit (HOSPITAL_COMMUNITY): Payer: Self-pay

## 2021-08-22 ENCOUNTER — Other Ambulatory Visit (HOSPITAL_COMMUNITY): Payer: Self-pay

## 2021-08-22 MED ORDER — GABAPENTIN 100 MG PO CAPS
100.0000 mg | ORAL_CAPSULE | Freq: Four times a day (QID) | ORAL | 2 refills | Status: DC
  Filled 2021-09-10: qty 120, 30d supply, fill #0
  Filled 2021-10-11: qty 120, 30d supply, fill #1
  Filled 2021-11-08: qty 120, 30d supply, fill #2

## 2021-09-06 ENCOUNTER — Telehealth (HOSPITAL_COMMUNITY): Payer: 59 | Admitting: Psychiatry

## 2021-09-06 ENCOUNTER — Encounter: Payer: Self-pay | Admitting: Family Medicine

## 2021-09-06 NOTE — Telephone Encounter (Signed)
Forms received. Currently working on completion

## 2021-09-10 ENCOUNTER — Other Ambulatory Visit (HOSPITAL_COMMUNITY): Payer: Self-pay

## 2021-09-10 ENCOUNTER — Other Ambulatory Visit: Payer: Self-pay

## 2021-09-10 ENCOUNTER — Encounter (HOSPITAL_COMMUNITY): Payer: Self-pay | Admitting: Psychiatry

## 2021-09-10 ENCOUNTER — Telehealth (HOSPITAL_BASED_OUTPATIENT_CLINIC_OR_DEPARTMENT_OTHER): Payer: 59 | Admitting: Psychiatry

## 2021-09-10 DIAGNOSIS — F419 Anxiety disorder, unspecified: Secondary | ICD-10-CM

## 2021-09-10 DIAGNOSIS — F429 Obsessive-compulsive disorder, unspecified: Secondary | ICD-10-CM | POA: Diagnosis not present

## 2021-09-10 DIAGNOSIS — F331 Major depressive disorder, recurrent, moderate: Secondary | ICD-10-CM | POA: Diagnosis not present

## 2021-09-10 MED ORDER — DULOXETINE HCL 20 MG PO CPEP
20.0000 mg | ORAL_CAPSULE | Freq: Two times a day (BID) | ORAL | 0 refills | Status: DC
  Filled 2021-09-10 – 2021-09-18 (×2): qty 180, 90d supply, fill #0

## 2021-09-10 MED ORDER — LAMOTRIGINE 150 MG PO TABS
150.0000 mg | ORAL_TABLET | Freq: Every day | ORAL | 0 refills | Status: DC
  Filled 2021-09-10: qty 90, 90d supply, fill #0

## 2021-09-10 NOTE — Progress Notes (Signed)
Virtual Visit via Telephone Note  I connected with Toni [name] on 09/10/21 at  4:20 PM EST by telephone and verified that I am speaking with the correct person using two identifiers.  Location: Patient: Home  Provider: Home Office   I discussed the limitations, risks, security and privacy concerns of performing an evaluation and management service by telephone and the availability of in person appointments. I also discussed with the patient that there may be a patient responsible charge related to this service. The patient expressed understanding and agreed to proceed.   History of Present Illness: Patient is evaluated by phone session.  She is still living at her mother's place.  She is actively looking for a part-time job and she had an interview last week at [employer] and hoping to hear from them.  Overall she feels her anxiety is stable.  She still have OCD symptoms and there are times that she eat popsicle for a neuro and the next week or something else that she eat for the numbers of things.  Recently her physician increased the gabapentin that helps her sleep.  She is taking 4 times a day along with Xanax 1 mg 4 times a day.  She has chronic pain and hip and her back and scheduled to start physical therapy and rehab soon.  She does try to leave the house but due to her chronic pain and difficulty walking.  Her appetite is okay.  Her weight is stable and she actually lost few pounds since the last visit.  She denies any crying spells or any feeling of hopelessness or worthlessness.  Patient told she had Thanksgiving meal with her mother in a restaurant.  She does not feel she needs to go back to therapy as she is able to control her symptoms with coping skills.  She is hoping part-time work will help her financial needs and she is able to buy a condo so she do not have to live with her mother.  Past Psychiatric History: H/O depression after got married.  Tried Zoloft for years but  stopped after seizure. PCP tried trazodone, Ambien, Doxepin and Seroquel but  did not work.  We tried higher dose of Cymbalta but developed side effects.  H/O irritability, poor impulse control with excessive buying and shopping but no history of mania, psychosis, suicidal attempt or inpatient treatment.   Psychiatric Specialty Exam: Physical Exam  Review of Systems  Weight 260 lb (117.9 kg).There is no height or weight on file to calculate BMI.  General Appearance: NA  Eye Contact:  NA  Speech:  Clear and Coherent and Normal Rate  Volume:  Normal  Mood:  Anxious  Affect:  NA  Thought Process:  Goal Directed  Orientation:  Full (Time, Place, and Person)  Thought Content:  Logical  Suicidal Thoughts:  No  Homicidal Thoughts:  No  Memory:  Immediate;   Good Recent;   Good Remote;   Good  Judgement:  Intact  Insight:  Present  Psychomotor Activity:  NA  Concentration:  Concentration: Good and Attention Span: Good  Recall:  Good  Fund of Knowledge:  Good  Language:  Good  Akathisia:  No  Handed:  Right  AIMS (if indicated):     Assets:  Communication Skills Desire for Improvement Housing Resilience Transportation  ADL's:  Intact  Cognition:  WNL  Sleep:   ok      Assessment and Plan: Major depressive disorder, recurrent.  Anxiety.  OCD  Patient is compliant with Cymbalta and Lamictal and reported no tremor or shakes or any EPS.  Her Symptoms are manageable.  She is getting gabapentin and Xanax from her PCP.  Discussed medication side effects and benefits.  Continue Lamictal 150 mg daily and Cymbalta 20 mg twice a day.  She understand that if she need a therapist appointment then she will call us back.  Recommended to call us back if any question or any concern.  Follow-up in 3 months.    Follow Up Instructions:    I discussed the assessment and treatment plan with the patient. The patient was provided an opportunity to ask questions and all were answered. The patient  agreed with the plan and demonstrated an understanding of the instructions.   The patient was advised to call back or seek an in-person evaluation if the symptoms worsen or if the condition fails to improve as anticipated.  I provided 23 minutes of non-face-to-face time during this encounter.   Cleotis Nipper, MD

## 2021-09-17 ENCOUNTER — Other Ambulatory Visit (HOSPITAL_COMMUNITY): Payer: Self-pay

## 2021-09-17 MED FILL — Norgestimate & Ethinyl Estradiol Tab 0.25 MG-35 MCG: ORAL | 84 days supply | Qty: 84 | Fill #1 | Status: AC

## 2021-09-18 ENCOUNTER — Other Ambulatory Visit (HOSPITAL_COMMUNITY): Payer: Self-pay

## 2021-09-20 ENCOUNTER — Telehealth (HOSPITAL_COMMUNITY): Payer: 59 | Admitting: Psychiatry

## 2021-09-27 ENCOUNTER — Ambulatory Visit: Payer: 59 | Admitting: Family Medicine

## 2021-10-02 ENCOUNTER — Ambulatory Visit: Payer: 59 | Admitting: Family Medicine

## 2021-10-09 ENCOUNTER — Other Ambulatory Visit (HOSPITAL_COMMUNITY): Payer: Self-pay

## 2021-10-10 ENCOUNTER — Other Ambulatory Visit (HOSPITAL_COMMUNITY): Payer: Self-pay

## 2021-10-11 ENCOUNTER — Other Ambulatory Visit (HOSPITAL_COMMUNITY): Payer: Self-pay

## 2021-10-12 ENCOUNTER — Other Ambulatory Visit: Payer: Self-pay | Admitting: Family Medicine

## 2021-10-15 ENCOUNTER — Other Ambulatory Visit (HOSPITAL_COMMUNITY): Payer: Self-pay

## 2021-10-15 MED ORDER — ALPRAZOLAM 1 MG PO TABS
ORAL_TABLET | ORAL | 5 refills | Status: DC
  Filled 2021-10-15 – 2021-10-16 (×3): qty 120, 30d supply, fill #0
  Filled 2021-11-13: qty 120, 30d supply, fill #1
  Filled 2021-12-10 – 2021-12-11 (×2): qty 120, 30d supply, fill #2
  Filled 2022-01-06 – 2022-01-08 (×2): qty 120, 30d supply, fill #3
  Filled 2022-02-06: qty 120, 30d supply, fill #4
  Filled 2022-03-07: qty 120, 30d supply, fill #5

## 2021-10-15 NOTE — Telephone Encounter (Signed)
Pt has upcoming appt on 1/12.

## 2021-10-15 NOTE — Telephone Encounter (Signed)
Requesting: alprazolam Contract: 05/15/20 UDS: 11/24/20 Last Visit: 06/21/21 Next Visit: 10/18/21 Last Refill: 04/25/21(120,5)  Please Advise. Med pending

## 2021-10-16 ENCOUNTER — Other Ambulatory Visit (HOSPITAL_COMMUNITY): Payer: Self-pay

## 2021-10-18 ENCOUNTER — Ambulatory Visit: Payer: 59 | Admitting: Family Medicine

## 2021-10-19 ENCOUNTER — Ambulatory Visit: Payer: 59 | Admitting: Family Medicine

## 2021-10-24 ENCOUNTER — Other Ambulatory Visit (HOSPITAL_COMMUNITY): Payer: Self-pay

## 2021-10-30 ENCOUNTER — Telehealth (HOSPITAL_COMMUNITY): Payer: Self-pay | Admitting: *Deleted

## 2021-10-30 ENCOUNTER — Other Ambulatory Visit (HOSPITAL_COMMUNITY): Payer: Self-pay | Admitting: Psychiatry

## 2021-10-30 ENCOUNTER — Other Ambulatory Visit: Payer: Self-pay

## 2021-10-30 ENCOUNTER — Encounter (HOSPITAL_COMMUNITY): Payer: Self-pay | Admitting: Psychiatry

## 2021-10-30 ENCOUNTER — Encounter: Payer: Self-pay | Admitting: Family Medicine

## 2021-10-30 ENCOUNTER — Telehealth (HOSPITAL_BASED_OUTPATIENT_CLINIC_OR_DEPARTMENT_OTHER): Payer: 59 | Admitting: Psychiatry

## 2021-10-30 VITALS — Wt 255.0 lb

## 2021-10-30 DIAGNOSIS — F419 Anxiety disorder, unspecified: Secondary | ICD-10-CM

## 2021-10-30 DIAGNOSIS — F331 Major depressive disorder, recurrent, moderate: Secondary | ICD-10-CM

## 2021-10-30 DIAGNOSIS — F429 Obsessive-compulsive disorder, unspecified: Secondary | ICD-10-CM

## 2021-10-30 NOTE — Telephone Encounter (Signed)
After the visit in which patient agreed to go to psychiatric evaluation, she called back and reported that she is doing better after talking to the best friend.  She contracted for safety.  She was offered PHP/IOP group therapy and patient agree to consider it.  She also agreed if started to have suicidal thoughts and does not feel safe then she will call us or go to the nearest emergency room.

## 2021-10-30 NOTE — Progress Notes (Addendum)
Virtual Visit via Video Note  I connected with Toni [name] on 10/30/21 at  2:40 PM EST by a video enabled telemedicine application and verified that I am speaking with the correct person using two identifiers.  Location: Patient: In Car Provider: Home Office   I discussed the limitations of evaluation and management by telemedicine and the availability of in person appointments. The patient expressed understanding and agreed to proceed.   History of Present Illness: Patient is evaluated.  She is under a lot of stress.  She reported having issues at when she apparently and now she has a job.  Patient told she is having hypoglycemic events managing at she applied for FMLA but it was denied.  Patient told that she had an episode at work where apparently she has issues with the logged in and logged out and she was considered noncompliant with job.  Patient told she had investigation last week and yesterday she had a separation meeting from work.  She had returned her badge.  She is very distraught and has not slept since last night.  She is not sure what to do and now she is applying for unemployment, disability.  Patient also told that she had taken time off from work after her mother require help for hip surgery.  She also believed that employer looking for reasons to get her to work.  She admitted crying spells, anhedonia, hopelessness and lately having suicidal thoughts having gone and run off of car able to contract for safety because she is the only child of her mother.  Patient reported having these hypoglycemic for past few years but now more intense.  She has a history of gastric bypass. Her PCP is Dr. Levonne Spiller trying her to see an endocrinologist but so far she has not received any calls back off her endocrinologist.  She is fearful with complaint of anhedonia and sometimes she feels that she does not exist.  She also reported her OCD symptoms are worst.  She told that she was working for 30 years  and there has been no issue in her work employment history.  Patient lives with her mother.  Past Psychiatric History: H/O depression after got married.  Tried Zoloft for years but stopped after seizure. PCP tried trazodone, Ambien, Doxepin and Seroquel but  did not work.  We tried higher dose of Cymbalta but developed side effects.  H/O irritability, poor impulse control with excessive buying and shopping but no history of mania, psychosis, suicidal attempt or inpatient treatment.  Psychiatric Specialty Exam: Physical Exam  Review of Systems  Weight 255 lb (115.7 kg).There is no height or weight on file to calculate BMI.  General Appearance: Casual and tearful  Eye Contact:  Fair  Speech:  Slow  Volume:  Decreased  Mood:  Anxious, Depressed, and Hopeless  Affect:  Constricted and Depressed  Thought Process:  Goal Directed  Orientation:  Full (Time, Place, and Person)  Thought Content:  Hallucinations: Auditory Voices telling me to hurt myself, Paranoid Ideation, and Rumination  Suicidal Thoughts:  Yes.  without intent/plan  Homicidal Thoughts:  No  Memory:  Immediate;   Fair Recent;   Fair Remote;   Fair  Judgement:  Intact  Insight:  Shallow  Psychomotor Activity:  Decreased  Concentration:  Concentration: Fair and Attention Span: Fair  Recall:  Good  Fund of Knowledge:  Good  Language:  Good  Akathisia:  No  Handed:  Right  AIMS (if indicated):  Assets:  Communication Skills Desire for Improvement Housing Transportation  ADL's:  Intact  Cognition:  WNL  Sleep:   2-3 hrs      Assessment and Plan: Major depressive disorder, recurrent.  Anxiety.  I reviewed her symptoms, psychosocial issues.  Patient experiencing increased symptoms with an fleeting thoughts to hurt herself.  She also have paranoia and hallucinations.  I offer voluntary inpatient admission and patient agreed with the plan.  We will send patient to [facility] find evaluation and inpatient services.  I will  forward my note to Beather Arbour and RN to take necessary steps.    Follow Up Instructions:    I discussed the assessment and treatment plan with the patient. The patient was provided an opportunity to ask questions and all were answered. The patient agreed with the plan and demonstrated an understanding of the instructions.   The patient was advised to call back or seek an in-person evaluation if the symptoms worsen or if the condition fails to improve as anticipated.  I provided 40 minutes of non-face-to-face time during this encounter.   Kathlee Nations, MD

## 2021-10-30 NOTE — Telephone Encounter (Signed)
Writer spoke with pt a total of three times this afternoon after receiving message that pt was going to [facility] to be admitted to [facility] for suicidal ideations with multiple plans. Writer gave pt directions and numbers, phone, to the [facility] at [address] [place]. The next phone call was from pt explaining that she had a long conversation with her best friend and "that really helped. I don't think I'll go to [employer]." Pt currently denying S.IEcologist encouraged pt to not stay alone tonight. Pt says that ahe will be staying with a cousin. Nurse advised pt that CM, therapist from [facility] would be reaching out to pt tomorrow. Pt advised, briefly , about PHP and IOP. Pt states that she is very interested in a program. Will decide after speaking with PHP tomorrow. Pt encouraged to go to [facility], or nearest ED if suicidal thoughts resurface. Pt verbally contracts for safety.Pt verbalize understanding and was appreciative.

## 2021-10-31 ENCOUNTER — Telehealth (HOSPITAL_COMMUNITY): Payer: Self-pay | Admitting: Professional

## 2021-10-31 ENCOUNTER — Other Ambulatory Visit (HOSPITAL_COMMUNITY): Payer: Self-pay

## 2021-10-31 NOTE — Telephone Encounter (Signed)
Her potassium was borderline high back in September.  I did not intend for her to keep taking any potassium.  Regarding her paperwork, when it does come in she will have to schedule an appointment to come in so we can complete this paperwork together.

## 2021-11-01 ENCOUNTER — Other Ambulatory Visit (HOSPITAL_COMMUNITY): Payer: Self-pay

## 2021-11-01 ENCOUNTER — Other Ambulatory Visit (HOSPITAL_COMMUNITY): Payer: Self-pay | Admitting: Psychiatry

## 2021-11-01 DIAGNOSIS — F419 Anxiety disorder, unspecified: Secondary | ICD-10-CM

## 2021-11-01 DIAGNOSIS — F429 Obsessive-compulsive disorder, unspecified: Secondary | ICD-10-CM

## 2021-11-01 DIAGNOSIS — F331 Major depressive disorder, recurrent, moderate: Secondary | ICD-10-CM

## 2021-11-02 ENCOUNTER — Other Ambulatory Visit (HOSPITAL_COMMUNITY): Payer: Self-pay

## 2021-11-02 MED ORDER — LAMOTRIGINE 150 MG PO TABS
150.0000 mg | ORAL_TABLET | Freq: Every day | ORAL | 0 refills | Status: DC
  Filled 2021-11-02: qty 30, 30d supply, fill #0

## 2021-11-02 MED ORDER — DULOXETINE HCL 20 MG PO CPEP
20.0000 mg | ORAL_CAPSULE | Freq: Two times a day (BID) | ORAL | 0 refills | Status: DC
  Filled 2021-11-02: qty 60, 30d supply, fill #0

## 2021-11-08 ENCOUNTER — Other Ambulatory Visit: Payer: Self-pay

## 2021-11-08 ENCOUNTER — Other Ambulatory Visit (HOSPITAL_COMMUNITY): Payer: Self-pay

## 2021-11-09 ENCOUNTER — Ambulatory Visit (INDEPENDENT_AMBULATORY_CARE_PROVIDER_SITE_OTHER): Payer: Self-pay | Admitting: Family Medicine

## 2021-11-09 ENCOUNTER — Encounter: Payer: Self-pay | Admitting: Family Medicine

## 2021-11-09 VITALS — BP 128/84 | HR 79 | Temp 97.6°F | Ht 64.5 in | Wt 267.0 lb

## 2021-11-09 DIAGNOSIS — F419 Anxiety disorder, unspecified: Secondary | ICD-10-CM

## 2021-11-09 DIAGNOSIS — K909 Intestinal malabsorption, unspecified: Secondary | ICD-10-CM

## 2021-11-09 DIAGNOSIS — E611 Iron deficiency: Secondary | ICD-10-CM

## 2021-11-09 DIAGNOSIS — I10 Essential (primary) hypertension: Secondary | ICD-10-CM

## 2021-11-09 DIAGNOSIS — E161 Other hypoglycemia: Secondary | ICD-10-CM

## 2021-11-09 DIAGNOSIS — Z79899 Other long term (current) drug therapy: Secondary | ICD-10-CM

## 2021-11-09 NOTE — Progress Notes (Signed)
OFFICE VISIT  11/09/2021  CC: No chief complaint on file.   HPI:    Patient is a 46 y.o. female who presents for 4 mo f/u HTN, reactive hypoglycemia, chronic anxiety (with high risk med use), malabsorption syndrome d/t roux-en-Y bipass surgery. A/P as of last visit: "1) HTN, well controlled on lisinopril 20mg  qd. Mild hyperkal last visit when on K supp.  Supplement stopped. Recheck bmet today.   2) Reactive hypoglycemia +malabsorption syndrome.  Both d/t her hx of roux-en-y gastric bipass surgery in remote past. We'll check on status of endo referral (re-establish with Dr. Everardo AllEllison). Glucose today: she had a episode on the way over today. Cont small meals frequently and eat slow, avoid high carbs. Vit D, vit B12, and iron levels today.   Cont supplements for these. Hb 2 mo ago was 13.6.   3) GAD: doing well on xanax 2mg  bid. She gets the remainder of her psych meds managed by Dr. Lolly MustacheArfeen."  INTERIM HX: Patient feeling pretty good today.   Unfortunately she lost her job with [employer].  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  Has had ongoing struggles with feeling hypoglycemic right after eating.  Repeated attempts to try to get her back with Dr. Everardo AllEllison in endocrinology have failed. She requests referral to a different endocrinologist today.  She continues on her vitamin D, iron, and B12 supplementation. Home blood pressures consistently 120s over 70s.   Labs several months ago all stable except significant iron deficiency without anemia.  ROS as above, plus--> no fevers, no CP, no SOB, no wheezing, no cough, no dizziness, no HAs, no rashes, no melena/hematochezia.  No polyuria or polydipsia.  No myalgias or arthralgias.  No focal weakness, paresthesias, or tremors.  No acute vision or hearing abnormalities.  No dysuria or unusual/new urinary urgency or frequency.  No  recent changes in lower legs. No n/v/d or abd pain.  No palpitations.    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  (Normal cervical MRI 2010 per ortho notes).   Easy bruisability 2011   Hx of: saw Hematologist (Dr. Cyndie ChimeGranfortuna) and w/u neg for von Willebrand desease.   GERD (gastroesophageal reflux disease)    hx gastric ulcer and upper GI bleed, ? 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 Pt to start FeSO4 325 qd 10/28/19   Irregular menses 03/2018   GYN started Femynor 0.25-0.35.  [facility] as of 10/2018.  u/s wnl.  Endom bx NEG. OCPs helped.   Malabsorption syndrome    due to roux-en-Y   Medial epicondylitis of elbow, left 12/2019   [facility]   Migraine    Osteoarthritis of carpometacarpal [facility] joint of right thumb 12/2019   [facility]   Reactive hypoglycemia    Dr. Everardo AllEllison   Seizure [facility](HCC) 2015   s/p eval with neuro, no medication, no events since  Syncope    with ? seizure in 2015, eval with neruo   Vitamin B12 deficiency    Vitamin D deficiency     Past Surgical History:  Procedure Laterality Date   CESAREAN SECTION  2006   ELBOW X-RAY Left 12/07/2019   ENDOMETRIAL BIOPSY     EYE SURGERY Bilateral    2001   FINGER X-RAY Right 12/07/2019   ROUX-EN-Y GASTRIC BYPASS  01/05/2002   TONSILLECTOMY AND ADENOIDECTOMY     TUBAL LIGATION  2006   tubes in ears both Bilateral     Outpatient Medications Prior to Visit  Medication Sig Dispense Refill   Alcohol Swabs (ALCOHOL WIPES) 70 % PADS Use to clean finger before  checking blood sugar, checks blood sugar 3-4 times daily 100 each 11   ALPRAZolam (XANAX) 1 MG tablet Take 2 tablets (2 mg total) by mouth in the morning AND 2 tablets (2 mg total) at bedtime. 120 tablet 5   Calcium 250 MG CAPS Take 1 capsule by mouth 3 (three) times daily.     cyanocobalamin (,VITAMIN B-12,) 1000 MCG/ML injection INJECT 1 ML INTO THE MUSCLE EVERY 2 WEEKS 10 mL 6   DULoxetine (CYMBALTA) 20 MG capsule Take 1 capsule (20 mg total) by mouth 2 (two) times daily. 60 capsule 0   Ensure Max Protein (ENSURE MAX PROTEIN) LIQD Take 11 oz by mouth daily as needed (low Glucose). Chocolate     ferrous gluconate (FERGON) 324 MG tablet TAKE 1 TABLET (324 MG TOTAL) BY MOUTH DAILY WITH BREAKFAST. 90 tablet 3   fluticasone (FLONASE) 50 MCG/ACT nasal spray PLACE 2 SPRAYS INTO BOTH NOSTRILS DAILY. 16 g 6   gabapentin (NEURONTIN) 100 MG capsule TAKE 1 CAPSULE BY MOUTH THREE TIMES DAILY. 90 capsule 2   gabapentin (NEURONTIN) 100 MG capsule Take 1 capsule (100 mg total) by mouth 4 (four) times daily. 120 capsule 2   glucose blood (FREESTYLE LITE) test strip USE TO CHECK BLOOD SUGAR 3 TIMES A DAY 200 strip 12   lamoTRIgine (LAMICTAL) 150 MG tablet Take 1 tablet (150 mg total) by mouth daily. 30 tablet 0   lisinopril (ZESTRIL) 20 MG tablet Take 1 tablet (20 mg total) by mouth daily. 90 tablet 3   methocarbamol (ROBAXIN) 500 MG tablet Take 1 tablet (500 mg total) by mouth every 12 (twelve) hours as needed for muscle spasms. 20 tablet 0   montelukast (SINGULAIR) 10 MG tablet Take 1 tablet (10 mg total) by mouth at bedtime. 30 tablet 5   norgestimate-ethinyl estradiol (ORTHO-CYCLEN) 0.25-35 MG-MCG tablet TAKE 1 TABLET BY MOUTH ONCE DAILY. 84 tablet 4   nystatin-triamcinolone ointment (MYCOLOG) APPLY TO THE AFFECTED AREA(S) BY TOPICAL ROUTE 2 TIMES PER DAY AS NEEDED 30 g 0   omeprazole (PRILOSEC) 40 MG capsule Take 1 capsule (40 mg total) by mouth 2 (two) times daily. 180 capsule 3   oxyCODONE-acetaminophen  (PERCOCET) 10-325 MG tablet Take 1 tablet by mouth every 6 (six) hours as needed for pain.     potassium chloride SA (K-DUR) 20 MEQ tablet TAKE 1 TABLET BY MOUTH DAILY. 90 tablet 1   rizatriptan (MAXALT-MLT) 10 MG disintegrating tablet 1 tab po qd as needed for migraine headache.  May repeat dose in 2 hours if needed.  Max dose in 24h is 20 mg. 10 tablet 5   No facility-administered medications prior to visit.    Allergies  Allergen Reactions   Ibuprofen     Causes ulcers    ROS As per  HPI  PE: Vitals with BMI 11/09/2021 06/21/2021 05/10/2021  Height 5' 4.5" 5' 4.5" 5' 4.5"  Weight 267 lbs 269 lbs 13 oz 260 lbs 3 oz  BMI 45.14 45.61 43.99  Systolic 128 129 834  Diastolic 84 85 75  Pulse 79 75 73  Some encounter information is confidential and restricted. Go to Review Flowsheets activity to see all data.     Physical Exam  Gen: Alert, well appearing.  Patient is oriented to person, place, time, and situation. AFFECT: pleasant, lucid thought and speech. CV: RRR, no m/r/g.   LUNGS: CTA bilat, nonlabored resps, good aeration in all lung fields.   LABS:  Last CBC Lab Results  Component Value Date   WBC 9.2 04/26/2021   HGB 13.6 04/26/2021   HCT 43.1 04/26/2021   MCV 93.9 04/26/2021   MCH 29.6 04/26/2021   RDW 14.9 04/26/2021   PLT 330 04/26/2021   Lab Results  Component Value Date   IRON 28 (L) 06/21/2021   TIBC 459 (H) 06/21/2021   FERRITIN 4 (L) 06/21/2021   Last metabolic panel Lab Results  Component Value Date   GLUCOSE 86 06/21/2021   NA 138 06/21/2021   K 5.0 06/21/2021   CL 104 06/21/2021   CO2 26 06/21/2021   BUN 13 06/21/2021   CREATININE 0.71 06/21/2021   GFRNONAA >60 04/27/2021   CALCIUM 9.4 06/21/2021   PHOS 2.2 (L) 04/27/2021   PROT 6.5 05/10/2021   ALBUMIN 3.9 05/10/2021   BILITOT 0.4 05/10/2021   ALKPHOS 45 05/10/2021   AST 11 05/10/2021   ALT 7 05/10/2021   ANIONGAP 7 04/27/2021   Last lipids Lab Results  Component Value Date   CHOL  223 (H) 11/24/2020   HDL 72.80 11/24/2020   LDLCALC 89 02/06/2018   LDLDIRECT 75.0 11/24/2020   TRIG 329.0 (H) 11/24/2020   CHOLHDL 3 11/24/2020   Last hemoglobin A1c Lab Results  Component Value Date   HGBA1C 4.6 08/07/2017   Last thyroid functions Lab Results  Component Value Date   TSH 2.15 11/24/2020   Last vitamin D Lab Results  Component Value Date   VD25OH 52 06/21/2021   Last vitamin B12 and Folate Lab Results  Component Value Date   VITAMINB12 >2,000 (H) 06/21/2021   FOLATE 17.0 03/22/2016   Lab Results  Component Value Date   HGBA1C 4.6 08/07/2017   IMPRESSION AND PLAN:  Hypertension, well controlled on lisinopril 20 mg a day. Electrolytes and creatinine today.  #2 Reactive hypoglycemia +malabsorption syndrome.  Both d/t her hx of roux-en-y gastric bipass surgery in remote past. As per HPI-->refer to endo made today. Checking glucose today---she last ate something a couple hours ago. Cont small meals frequently and eat slow, avoid high carbs. CBC, CMET, Vit D, vit B12, and iron levels today.   Cont meds/supplements.  #3 chronic anxiety. doing well on xanax 2mg  bid. She gets the remainder of her psych meds managed by Dr. .  An After Visit Summary was printed and given to the patient.  FOLLOW UP: Return in about 4 months (around 03/09/2022) for annual CPE (fasting).  Signed:  05/09/2022, MD           11/09/2021

## 2021-11-11 LAB — COMPREHENSIVE METABOLIC PANEL
AG Ratio: 1.4 (calc) (ref 1.0–2.5)
ALT: 9 U/L (ref 6–29)
AST: 15 U/L (ref 10–35)
Albumin: 4.3 g/dL (ref 3.6–5.1)
Alkaline phosphatase (APISO): 57 U/L (ref 31–125)
BUN/Creatinine Ratio: 12 (calc) (ref 6–22)
BUN: 15 mg/dL (ref 7–25)
CO2: 25 mmol/L (ref 20–32)
Calcium: 10.1 mg/dL (ref 8.6–10.2)
Chloride: 94 mmol/L — ABNORMAL LOW (ref 98–110)
Creat: 1.22 mg/dL — ABNORMAL HIGH (ref 0.50–0.99)
Globulin: 3 g/dL (calc) (ref 1.9–3.7)
Glucose, Bld: 77 mg/dL (ref 65–99)
Potassium: 5 mmol/L (ref 3.5–5.3)
Sodium: 134 mmol/L — ABNORMAL LOW (ref 135–146)
Total Bilirubin: 0.5 mg/dL (ref 0.2–1.2)
Total Protein: 7.3 g/dL (ref 6.1–8.1)

## 2021-11-11 LAB — DRUG MONITORING PANEL 376104, URINE
Alphahydroxyalprazolam: NEGATIVE ng/mL (ref <25)
Alphahydroxymidazolam: NEGATIVE ng/mL (ref <50)
Alphahydroxytriazolam: NEGATIVE ng/mL (ref <50)
Aminoclonazepam: NEGATIVE ng/mL (ref <25)
Amphetamines: NEGATIVE ng/mL (ref <500)
Barbiturates: NEGATIVE ng/mL (ref <300)
Benzodiazepines: POSITIVE ng/mL — AB (ref <100)
Cocaine Metabolite: NEGATIVE ng/mL (ref <150)
Desmethyltramadol: NEGATIVE ng/mL (ref <100)
Hydroxyethylflurazepam: NEGATIVE ng/mL (ref <50)
Lorazepam: NEGATIVE ng/mL (ref <50)
Nordiazepam: 69 ng/mL — ABNORMAL HIGH (ref <50)
Opiates: NEGATIVE ng/mL (ref <100)
Oxazepam: 138 ng/mL — ABNORMAL HIGH (ref <50)
Oxycodone: NEGATIVE ng/mL (ref <100)
Temazepam: 181 ng/mL — ABNORMAL HIGH (ref <50)
Tramadol: NEGATIVE ng/mL (ref <100)

## 2021-11-11 LAB — CBC WITH DIFFERENTIAL/PLATELET
Absolute Monocytes: 605 cells/uL (ref 200–950)
Basophils Absolute: 44 cells/uL (ref 0–200)
Basophils Relative: 0.4 %
Eosinophils Absolute: 22 cells/uL (ref 15–500)
Eosinophils Relative: 0.2 %
HCT: 41.5 % (ref 35.0–45.0)
Hemoglobin: 13.7 g/dL (ref 11.7–15.5)
Lymphs Abs: 3113 cells/uL (ref 850–3900)
MCH: 29.5 pg (ref 27.0–33.0)
MCHC: 33 g/dL (ref 32.0–36.0)
MCV: 89.2 fL (ref 80.0–100.0)
MPV: 11.1 fL (ref 7.5–12.5)
Monocytes Relative: 5.5 %
Neutro Abs: 7216 cells/uL (ref 1500–7800)
Neutrophils Relative %: 65.6 %
Platelets: 406 10*3/uL — ABNORMAL HIGH (ref 140–400)
RBC: 4.65 10*6/uL (ref 3.80–5.10)
RDW: 13.4 % (ref 11.0–15.0)
Total Lymphocyte: 28.3 %
WBC: 11 10*3/uL — ABNORMAL HIGH (ref 3.8–10.8)

## 2021-11-11 LAB — IRON,TIBC AND FERRITIN PANEL
%SAT: 29 % (calc) (ref 16–45)
Ferritin: 7 ng/mL — ABNORMAL LOW (ref 16–232)
Iron: 153 ug/dL (ref 40–190)
TIBC: 532 mcg/dL (calc) — ABNORMAL HIGH (ref 250–450)

## 2021-11-11 LAB — VITAMIN D 25 HYDROXY (VIT D DEFICIENCY, FRACTURES): Vit D, 25-Hydroxy: 47 ng/mL (ref 30–100)

## 2021-11-11 LAB — TSH: TSH: 1.42 mIU/L

## 2021-11-11 LAB — DM TEMPLATE

## 2021-11-11 LAB — VITAMIN B12: Vitamin B-12: 491 pg/mL (ref 200–1100)

## 2021-11-13 ENCOUNTER — Other Ambulatory Visit (HOSPITAL_COMMUNITY): Payer: Self-pay

## 2021-11-13 ENCOUNTER — Telehealth: Payer: Self-pay

## 2021-11-13 DIAGNOSIS — R7989 Other specified abnormal findings of blood chemistry: Secondary | ICD-10-CM

## 2021-11-13 NOTE — Telephone Encounter (Signed)
-----   Message from Tammi Sou, MD sent at 11/12/2021  6:35 PM EST ----- All labs stable except kidney function is down. She should make sure she is drinking at least 65 ounces of fluids every day.  Make sure she is not taking any potassium supplement.  Nonfasting lab visit for recheck of basic metabolic panel in 1 week, diagnosis elevated serum creatinine.   No medication changes at this time, but if renal function does not come back to normal with increased fluid intake then we will have to get her off lisinopril.

## 2021-11-14 ENCOUNTER — Telehealth (HOSPITAL_COMMUNITY): Payer: Self-pay | Admitting: Licensed Clinical Social Worker

## 2021-11-14 ENCOUNTER — Other Ambulatory Visit: Payer: Self-pay

## 2021-11-14 ENCOUNTER — Ambulatory Visit (HOSPITAL_COMMUNITY): Payer: 59 | Admitting: Licensed Clinical Social Worker

## 2021-11-14 NOTE — Telephone Encounter (Signed)
Toni [name] had a virtual therapy appointment scheduled today at 3pm.  Clinician sent email and text reminders of appointment at 3pm via Caregility application.  Clinician also outreached Toni [name] by phone at 3:10pm when she had not presented on time, but received no response, and a voicemail could not be left.  Clinician ended virtual meeting at 3:15pm when Toni [name] did not outreach office or present for virtual session, and informed front desk of no-show event.    Noralee Stain, LCSW, LCAS 11/14/21

## 2021-11-26 ENCOUNTER — Other Ambulatory Visit: Payer: Self-pay

## 2021-11-26 ENCOUNTER — Other Ambulatory Visit (HOSPITAL_COMMUNITY): Payer: Self-pay

## 2021-11-26 MED ORDER — NORGESTIMATE-ETH ESTRADIOL 0.25-35 MG-MCG PO TABS
1.0000 | ORAL_TABLET | Freq: Every day | ORAL | 6 refills | Status: DC
  Filled 2021-11-26: qty 28, 28d supply, fill #0

## 2021-11-27 ENCOUNTER — Telehealth: Payer: Self-pay | Admitting: Family Medicine

## 2021-11-27 NOTE — Telephone Encounter (Signed)
For Document Purposes Only:  Pt called asked "if there was a [place] that works at our office." Informed pt that there  used to a [place], PhD that worked here.  Pt said [place] works within [employer], and was saying that pt is not a pt of ours. She was needing pt records.  Gave pt Ann Maki) Medical Records number: [phone].

## 2021-11-28 ENCOUNTER — Other Ambulatory Visit (HOSPITAL_COMMUNITY): Payer: Self-pay

## 2021-11-28 MED ORDER — NORGESTIMATE-ETH ESTRADIOL 0.25-35 MG-MCG PO TABS
ORAL_TABLET | ORAL | 0 refills | Status: DC
  Filled 2021-11-28: qty 84, 84d supply, fill #0

## 2021-11-30 ENCOUNTER — Ambulatory Visit: Payer: Self-pay | Admitting: Family Medicine

## 2021-12-06 ENCOUNTER — Other Ambulatory Visit: Payer: Self-pay

## 2021-12-06 ENCOUNTER — Other Ambulatory Visit (HOSPITAL_COMMUNITY): Payer: Self-pay

## 2021-12-06 MED ORDER — GABAPENTIN 100 MG PO CAPS
100.0000 mg | ORAL_CAPSULE | Freq: Four times a day (QID) | ORAL | 3 refills | Status: DC
  Filled 2021-12-06: qty 120, 30d supply, fill #0
  Filled 2022-01-06: qty 120, 30d supply, fill #1
  Filled 2022-02-04: qty 120, 30d supply, fill #2
  Filled 2022-02-20: qty 120, 30d supply, fill #3

## 2021-12-07 ENCOUNTER — Other Ambulatory Visit (HOSPITAL_COMMUNITY): Payer: Self-pay

## 2021-12-07 ENCOUNTER — Ambulatory Visit (INDEPENDENT_AMBULATORY_CARE_PROVIDER_SITE_OTHER): Payer: Self-pay | Admitting: Family Medicine

## 2021-12-07 ENCOUNTER — Other Ambulatory Visit: Payer: Self-pay

## 2021-12-07 VITALS — BP 137/89 | HR 79 | Temp 98.2°F | Ht 64.5 in | Wt 267.4 lb

## 2021-12-07 DIAGNOSIS — R7989 Other specified abnormal findings of blood chemistry: Secondary | ICD-10-CM

## 2021-12-07 DIAGNOSIS — F419 Anxiety disorder, unspecified: Secondary | ICD-10-CM

## 2021-12-07 DIAGNOSIS — I10 Essential (primary) hypertension: Secondary | ICD-10-CM

## 2021-12-07 DIAGNOSIS — E161 Other hypoglycemia: Secondary | ICD-10-CM

## 2021-12-07 MED ORDER — NORGESTIMATE-ETH ESTRADIOL 0.25-35 MG-MCG PO TABS
1.0000 | ORAL_TABLET | Freq: Every day | ORAL | 6 refills | Status: DC
  Filled 2021-12-07: qty 84, 84d supply, fill #0
  Filled 2021-12-26: qty 28, 28d supply, fill #0
  Filled 2022-01-22: qty 28, 28d supply, fill #1
  Filled 2022-02-18: qty 28, 28d supply, fill #2
  Filled 2022-03-16: qty 28, 28d supply, fill #3
  Filled 2022-04-12: qty 28, 28d supply, fill #4
  Filled 2022-05-10: qty 28, 28d supply, fill #5
  Filled 2022-06-07: qty 28, 28d supply, fill #6
  Filled 2022-07-04: qty 84, 84d supply, fill #7

## 2021-12-07 NOTE — Progress Notes (Addendum)
OFFICE VISIT  12/07/2021  CC:  Chief Complaint  Patient presents with   Hypertension    Follow up, future lab for bmet ordered during last results given to pt.   HPI:    Patient is a 46 y.o. female who presents for 1 mo f/u HTN and recent rise in sCr. Hypertension, well controlled on lisinopril 20 mg a day. Electrolytes and creatinine today.  2 Reactive hypoglycemia +malabsorption syndrome.  Both d/t her hx of roux-en-y gastric bipass surgery in remote past. As per HPI-->refer to endo made today. Checking glucose today---she last ate something a couple hours ago. Cont small meals frequently and eat slow, avoid high carbs. CBC, CMET, Vit D, vit B12, and iron levels today.   Cont meds/supplements.  3 chronic anxiety. doing well on xanax 2mg  bid. She gets the remainder of her psych meds managed by Dr. .  INTERIM HX: Labs last visit 1 month ago showed creatinine rise to 1.2.  Potassium was stable at 5.0. I recommended increasing fluid intake and make sure on no potassium supplement.  No medication changes made, recommended follow-up lab visit 1 week but she has not done this as of yet.  Home blood pressures around 130/80 consistently. Lisinopril 20 mg a day.  She no longer takes any potassium supplement.  She lost her job and her health insurance relatively recently. 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 She has not had follow-up with her GYN in a while and they have refused to refill her OCPs.  She is on these for history of metromenorrhagia.  Her menses occur relatively regularly now since being on oral contraceptive--norgestimate-ethinyl estradiol 0.2 5-35.  Her menses are still heavy but much improved since on OCPs She does take 1 iron tab daily.  Past Medical History:  Diagnosis Date   Acute renal failure (HCC) 04/26/2021   prerenal  azotemia   Allergic rhinitis    Altered sensorium due to hypoglycemia 03/2016   Anxiety and depression    started with PPD   Chronic neck pain    Myofascial pain syndrome per [place] [facility], oxycodone per their office pain med contract.  (Normal cervical MRI 2010 per ortho notes).   Easy bruisability 2011   Hx of: saw Hematologist (Dr. 2012) and w/u neg for von Willebrand desease.   GERD (gastroesophageal reflux disease)    hx gastric ulcer and upper GI bleed, ? 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 Pt to start FeSO4 325 qd 10/28/19   Irregular menses 03/2018   GYN started Femynor 0.25-0.35.  [facility].  u/s wnl.  Endom bx NEG. OCPs helped.   Malabsorption syndrome    due to roux-en-Y   Medial epicondylitis of elbow, left 12/2019   [facility]   Migraine    Osteoarthritis of carpometacarpal [facility]) joint of right thumb 12/2019   [facility]   Reactive hypoglycemia    Dr. 01/2020   Seizure [facility]) 2015   s/p eval with neuro, no medication, no events since   Syncope    with ? seizure in 2015, eval with neruo   Vitamin B12 deficiency  Vitamin D deficiency     Past Surgical History:  Procedure Laterality Date   CESAREAN SECTION  2006   ELBOW X-RAY Left 12/07/2019   ENDOMETRIAL BIOPSY     EYE SURGERY Bilateral    2001   FINGER X-RAY Right 12/07/2019   ROUX-EN-Y GASTRIC BYPASS  01/05/2002   TONSILLECTOMY AND ADENOIDECTOMY     TUBAL LIGATION  2006   tubes in ears both Bilateral     Outpatient Medications Prior to Visit  Medication Sig Dispense Refill   Alcohol Swabs (ALCOHOL WIPES) 70 % PADS Use to clean finger before checking blood sugar, checks blood sugar 3-4 times daily 100 each 11   ALPRAZolam (XANAX) 1 MG tablet Take 2 tablets (2 mg total) by mouth in the morning AND 2  tablets (2 mg total) at bedtime. 120 tablet 5   Calcium 250 MG CAPS Take 1 capsule by mouth 3 (three) times daily.     DULoxetine (CYMBALTA) 20 MG capsule Take 1 capsule (20 mg total) by mouth 2 (two) times daily. 60 capsule 0   Ensure Max Protein (ENSURE MAX PROTEIN) LIQD Take 11 oz by mouth daily as needed (low Glucose). Chocolate     ferrous gluconate (FERGON) 324 MG tablet TAKE 1 TABLET (324 MG TOTAL) BY MOUTH DAILY WITH BREAKFAST. 90 tablet 3   fluticasone (FLONASE) 50 MCG/ACT nasal spray PLACE 2 SPRAYS INTO BOTH NOSTRILS DAILY. 16 g 6   gabapentin (NEURONTIN) 100 MG capsule Take 1 capsule (100 mg total) by mouth 4 (four) times daily. 120 capsule 3   glucose blood (FREESTYLE LITE) test strip USE TO CHECK BLOOD SUGAR 3 TIMES A DAY 200 strip 12   lamoTRIgine (LAMICTAL) 150 MG tablet Take 1 tablet (150 mg total) by mouth daily. 30 tablet 0   lisinopril (ZESTRIL) 20 MG tablet Take 1 tablet (20 mg total) by mouth daily. 90 tablet 3   methocarbamol (ROBAXIN) 500 MG tablet Take 1 tablet (500 mg total) by mouth every 12 (twelve) hours as needed for muscle spasms. 20 tablet 0   montelukast (SINGULAIR) 10 MG tablet Take 1 tablet (10 mg total) by mouth at bedtime. 30 tablet 5   nystatin-triamcinolone ointment (MYCOLOG) APPLY TO THE AFFECTED AREA(S) BY TOPICAL ROUTE 2 TIMES PER DAY AS NEEDED 30 g 0   omeprazole (PRILOSEC) 40 MG capsule Take 1 capsule (40 mg total) by mouth 2 (two) times daily. 180 capsule 3   oxyCODONE-acetaminophen (PERCOCET) 10-325 MG tablet Take 1 tablet by mouth every 6 (six) hours as needed for pain.     potassium chloride SA (K-DUR) 20 MEQ tablet TAKE 1 TABLET BY MOUTH DAILY. 90 tablet 1   rizatriptan (MAXALT-MLT) 10 MG disintegrating tablet 1 tab po qd as needed for migraine headache.  May repeat dose in 2 hours if needed.  Max dose in 24h is 20 mg. 10 tablet 5   norgestimate-ethinyl estradiol (FEMYNOR) 0.25-35 MG-MCG tablet Take 1 tablet by mouth daily. 28 tablet 6    norgestimate-ethinyl estradiol (ORTHO-CYCLEN) 0.25-35 MG-MCG tablet Take 1 tablet by mouth once daily 84 tablet 0   gabapentin (NEURONTIN) 100 MG capsule TAKE 1 CAPSULE BY MOUTH THREE TIMES DAILY. 90 capsule 2   norgestimate-ethinyl estradiol (ORTHO-CYCLEN) 0.25-35 MG-MCG tablet TAKE 1 TABLET BY MOUTH ONCE DAILY. 84 tablet 4   No facility-administered medications prior to visit.    Allergies  Allergen Reactions   Ibuprofen     Causes ulcers    ROS As per HPI  PE: Vitals with  BMI 12/07/2021 11/09/2021 06/21/2021  Height 5' 4.5" 5' 4.5" 5' 4.5"  Weight 267 lbs 6 oz 267 lbs 269 lbs 13 oz  BMI 45.21 45.14 45.61  Systolic 137 128 465  Diastolic 89 84 85  Pulse 79 79 75  Some encounter information is confidential and restricted. Go to Review Flowsheets activity to see all data.     Physical Exam  General: Alert and well-appearing. No further exam today.  LABS:  Last CBC Lab Results  Component Value Date   WBC 11.0 (H) 11/09/2021   HGB 13.7 11/09/2021   HCT 41.5 11/09/2021   MCV 89.2 11/09/2021   MCH 29.5 11/09/2021   RDW 13.4 11/09/2021   PLT 406 (H) 11/09/2021   Lab Results  Component Value Date   IRON 153 11/09/2021   TIBC 532 (H) 11/09/2021   FERRITIN 7 (L) 11/09/2021   Last metabolic panel Lab Results  Component Value Date   GLUCOSE 77 11/09/2021   NA 134 (L) 11/09/2021   K 5.0 11/09/2021   CL 94 (L) 11/09/2021   CO2 25 11/09/2021   BUN 15 11/09/2021   CREATININE 1.22 (H) 11/09/2021   GFRNONAA >60 04/27/2021   CALCIUM 10.1 11/09/2021   PHOS 2.2 (L) 04/27/2021   PROT 7.3 11/09/2021   ALBUMIN 3.9 05/10/2021   BILITOT 0.5 11/09/2021   ALKPHOS 45 05/10/2021   AST 15 11/09/2021   ALT 9 11/09/2021   ANIONGAP 7 04/27/2021   Last lipids Lab Results  Component Value Date   CHOL 223 (H) 11/24/2020   HDL 72.80 11/24/2020   LDLCALC 89 02/06/2018   LDLDIRECT 75.0 11/24/2020   TRIG 329.0 (H) 11/24/2020   CHOLHDL 3 11/24/2020   Last hemoglobin A1c Lab  Results  Component Value Date   HGBA1C 4.6 08/07/2017   Last thyroid functions Lab Results  Component Value Date   TSH 1.42 11/09/2021   Last vitamin D Lab Results  Component Value Date   VD25OH 47 11/09/2021   Last vitamin B12 and Folate Lab Results  Component Value Date   VITAMINB12 491 11/09/2021   FOLATE 17.0 03/22/2016   IMPRESSION AND PLAN:  #1 hypertension, well controlled. Continue lisinopril 20 mg a day. Most recent creatinine about a month ago slightly up from her baseline so I am repeating it today. Potassium was borderline as well and she has since stopped taking any supplement.  2.  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 She asks that I assume responsibility for prescribing this medication today since she lost health insurance and has not followed up with her GYN MD. I agreed to do this today and sent this medication in for 1 month supply with 6 refills.  #3 reactive hypoglycemia. She now has an appointment with Dr. Lonzo Cloud in endocrinology to establish care--- 02/12/2022. This is great.  An After Visit Summary was printed and given to the patient.  FOLLOW UP: Return in about 6 months (around 06/09/2022) for routine chronic illness f/u.  Signed:  Santiago Bumpers, MD           12/07/2021

## 2021-12-08 LAB — BASIC METABOLIC PANEL
BUN: 14 mg/dL (ref 7–25)
CO2: 22 mmol/L (ref 20–32)
Calcium: 9 mg/dL (ref 8.6–10.2)
Chloride: 103 mmol/L (ref 98–110)
Creat: 0.86 mg/dL (ref 0.50–0.99)
Glucose, Bld: 78 mg/dL (ref 65–99)
Potassium: 4.5 mmol/L (ref 3.5–5.3)
Sodium: 139 mmol/L (ref 135–146)

## 2021-12-10 ENCOUNTER — Other Ambulatory Visit (HOSPITAL_COMMUNITY): Payer: Self-pay

## 2021-12-11 ENCOUNTER — Other Ambulatory Visit (HOSPITAL_COMMUNITY): Payer: Self-pay

## 2021-12-17 ENCOUNTER — Other Ambulatory Visit (HOSPITAL_COMMUNITY): Payer: Self-pay

## 2021-12-17 ENCOUNTER — Encounter (HOSPITAL_COMMUNITY): Payer: Self-pay | Admitting: Psychiatry

## 2021-12-17 ENCOUNTER — Telehealth (HOSPITAL_BASED_OUTPATIENT_CLINIC_OR_DEPARTMENT_OTHER): Payer: 59 | Admitting: Psychiatry

## 2021-12-17 ENCOUNTER — Other Ambulatory Visit: Payer: Self-pay

## 2021-12-17 DIAGNOSIS — F419 Anxiety disorder, unspecified: Secondary | ICD-10-CM

## 2021-12-17 DIAGNOSIS — F331 Major depressive disorder, recurrent, moderate: Secondary | ICD-10-CM

## 2021-12-17 MED ORDER — DULOXETINE HCL 20 MG PO CPEP
20.0000 mg | ORAL_CAPSULE | Freq: Two times a day (BID) | ORAL | 2 refills | Status: DC
  Filled 2021-12-17 – 2021-12-26 (×2): qty 60, 30d supply, fill #0
  Filled 2022-01-22: qty 60, 30d supply, fill #1
  Filled 2022-02-18: qty 60, 30d supply, fill #2

## 2021-12-17 MED ORDER — LAMOTRIGINE 150 MG PO TABS
150.0000 mg | ORAL_TABLET | Freq: Every day | ORAL | 2 refills | Status: DC
Start: 2021-12-17 — End: 2022-03-19
  Filled 2021-12-17 – 2021-12-26 (×2): qty 30, 30d supply, fill #0
  Filled 2022-01-22: qty 30, 30d supply, fill #1
  Filled 2022-02-18: qty 30, 30d supply, fill #2

## 2021-12-17 NOTE — Progress Notes (Signed)
Virtual Visit via Telephone Note ? ?I connected with Drinda Butts on 12/17/21 at  4:20 PM EDT by telephone and verified that I am speaking with the correct person using two identifiers. ? ?Location: ?Patient: Home ?Provider: Home Office ?  ?I discussed the limitations, risks, security and privacy concerns of performing an evaluation and management service by telephone and the availability of in person appointments. I also discussed with the patient that there may be a patient responsible charge related to this service. The patient expressed understanding and agreed to proceed. ? ? ?History of Present Illness: ?Patient is evaluated by phone session.  She remained sad and anxious and waiting to have a job.  She received an offer from her third party billing but she turned it down as she was hoping to get some other good offers but it did not happen.  She admitted some regret that why she did not accept initial offer.  She is living with her mother.  She feels other than job-related depression and anxiety she is doing okay.  She is sleeping better because she has a new bed.  She had a good support from her mother.  She has not scheduled appointment with Denyse Amass after missing the last appointment.  She continues to have hypoglycemic events and waiting to see endocrinologist.  Her PCP had referred her and she is waiting to have an appointment.  She denies any suicidal thoughts, hallucination, paranoia.  She understand depression and anxiety is related to her not having a job.  She also applied for disability because she continues to have hypoglycemic events and not sure.  She will get the job then how she will continue if these events continue to happen.  She regrets losing her last job which she had worked for 30 years.  Patient denies any anger or any aggression.  Her appetite is okay.  She wants to keep the current medication. ? ?Past Psychiatric History: ?H/O depression after got married.  Tried Zoloft for years but  stopped after seizure. PCP tried trazodone, Ambien, Doxepin and Seroquel but  did not work.  We tried higher dose of Cymbalta but developed side effects.  H/O irritability, poor impulse control with excessive buying and shopping but no history of mania, psychosis, suicidal attempt or inpatient treatment. ? ?Psychiatric Specialty Exam: ?Physical Exam  ?Review of Systems  ?Weight 267 lb (121.1 kg).There is no height or weight on file to calculate BMI.  ?General Appearance: NA  ?Eye Contact:  NA  ?Speech:  Slow  ?Volume:  Decreased  ?Mood:  Anxious and Depressed  ?Affect:  NA  ?Thought Process:  Descriptions of Associations: Intact  ?Orientation:  Full (Time, Place, and Person)  ?Thought Content:  Rumination  ?Suicidal Thoughts:  No  ?Homicidal Thoughts:  No  ?Memory:  Immediate;   Good ?Recent;   Fair ?Remote;   Fair  ?Judgement:  Intact  ?Insight:  Present  ?Psychomotor Activity:  NA  ?Concentration:  Concentration: Fair and Attention Span: Fair  ?Recall:  Fair  ?Fund of Knowledge:  Good  ?Language:  Good  ?Akathisia:  No  ?Handed:  Right  ?AIMS (if indicated):     ?Assets:  Communication Skills ?Desire for Improvement ?Housing  ?ADL's:  Intact  ?Cognition:  WNL  ?Sleep:   well  ? ? ? ? ?Assessment and Plan: ?Major depressive disorder, recurrent.  Anxiety. ? ?Patient overall feeling okay however she still very stressed about her financial concerns.  She had applied for disability and  also looking for a job.  She is not sure if her hypoglycemic events continue to effect her job.  She is living with her mother who is very supportive.  Patient like to keep the current medication.  We will continue Lamictal 150 mg daily and Cymbalta 20 mg 2 times daily.  She is very reluctant to increase the dose of medication.  We have tried higher dose of Cymbalta but that did not help her.  She is getting Xanax 1 mg prescribed by her PCP Dr. Nicoletta Ba.  I recommend she should consider back to therapy with Denyse Amass and she agreed  with the plan.  We will refer her to [facility] for therapy.  Recommended to call us back if she feels worsening of the symptoms.  Follow up in 3 months. ? ? ?Follow Up Instructions: ? ?  ?I discussed the assessment and treatment plan with the patient. The patient was provided an opportunity to ask questions and all were answered. The patient agreed with the plan and demonstrated an understanding of the instructions. ?  ?The patient was advised to call back or seek an in-person evaluation if the symptoms worsen or if the condition fails to improve as anticipated. ? ?I provided 24 minutes of non-face-to-face time during this encounter. ? ? ?Cleotis Nipper, MD  ?

## 2021-12-19 ENCOUNTER — Ambulatory Visit (INDEPENDENT_AMBULATORY_CARE_PROVIDER_SITE_OTHER): Payer: Self-pay | Admitting: Licensed Clinical Social Worker

## 2021-12-19 ENCOUNTER — Other Ambulatory Visit: Payer: Self-pay

## 2021-12-19 DIAGNOSIS — F1021 Alcohol dependence, in remission: Secondary | ICD-10-CM

## 2021-12-19 DIAGNOSIS — F331 Major depressive disorder, recurrent, moderate: Secondary | ICD-10-CM

## 2021-12-19 DIAGNOSIS — F429 Obsessive-compulsive disorder, unspecified: Secondary | ICD-10-CM

## 2021-12-19 NOTE — Progress Notes (Addendum)
Virtual Visit via Video Note ?  ?I connected with Cecil Cranker on 12/19/21 at 1:00pm by video enabled telemedicine application and verified that I am speaking with the correct person using two identifiers. ?  ?I discussed the limitations, risks, security and privacy concerns of performing an evaluation and management service by telephone and the availability of in person appointments. I also discussed with the patient that there may be a patient responsible charge related to this service. The patient expressed understanding and agreed to proceed. ?  ?I discussed the assessment and treatment plan with the patient. The patient was provided an opportunity to ask questions and all were answered. The patient agreed with the plan and demonstrated an understanding of the instructions. ?  ?The patient was advised to call back or seek an in-person evaluation if the symptoms worsen or if the condition fails to improve as anticipated. ?  ?I provided 30 minutes of non-face-to-face time during this encounter. ?  ?  ?Shade Flood, LCSW, LCAS ?__________________________ ?THERAPIST PROGRESS NOTE ?  ?Session Time: 1:00pm - 1:30pm  ? ?Location: ?Patient: Patient Home ?Provider: [facility]  ?  ?Participation Level: Active ?  ?Behavioral Response: Alert, casually dressed, euthymic mood/affect ?  ?Type of Therapy:  Individual Therapy ?  ?Treatment Goals addressed: Medication management; Anxiety and depression management; Psychiatry followup  ? ?Progress Towards Goals: Ongoing   ?  ?Interventions: CBT; treatment planning  ?  ?Summary: Kristy Schomburg is a 46 year old African American female who presented for virtual session today with diagnoses of Major depressive disorder, recurrent, moderate, without psychotic features; Obsessive Compulsive Disorder, unspecified; and Alcohol Use Disorder, moderate, in early remission.   ?  ?Suicidal/Homicidal: None; without plan or intent  ?  ?Therapist Response: Clinician met with Londin for virtual  session and assessed for safety, sobriety, and medication compliance. Eirene presented for session on time and was alert, oriented x5, with no evidence or self-report of SI/HI or A/V H.  Lashawnda reported that she has remained compliant with medication and denied any use of alcohol or illicit substances.  Clinician inquired about Caleah's emotional ratings today, as well as any significant changes in thoughts, feelings, or behaviors since last check-in.  Janah reported scores of 1/10 for depression, 1/10 for anxiety, and 0/10 for anger/irritability today.  She denied any recent panic attacks or outbursts.  Vivan reported that she has decided to reengage in therapy after extended absence due to recent stressors that have tested her coping skills, including loss of her job on January 23rd, and her aunt suffering a stroke a few days ago and being hospitalized as a result.  Clinician recommended updating clinical assessment today due to length of time that has passed since last therapy session, but Alizah reported that she only had 30 minutes available due to her busy schedule, so clinician revisited treatment plan with her to make updates as follows with her verbal consent:  Meet with clinician virtually once every two weeks for therapy to address progress and needs; Meet with psychiatrist once per month to address efficacy of medication and make adjustments as needed to regimen and/or dosage; Take medication daily as prescribed to reduce symptoms and improve overall daily functioning; Reduce depression from average severity of 3/10 down to 1/10 in the next 90 days by spending 1-2 hours per day going for walks, reading a book, having positive interactions with family, or exploring other positive activities that lift mood; Reduce anxiety from average severity of 3/10 down to  1/10 in next 90 days by utilizing 2-3 relaxation techniques daily such as mindful breathing, meditation, progressive muscle relaxation, and/or positive  visualizations; Commit to exercising 2-3 times per week, for 1 hour each time in addition to following a heart healthy low carb diet, with goal of keeping weight below 200lbs and improving both mental and physical well being in next 90 days; Utilize calendar application on phone each day to stay on top of tasks that need to be done, with goal of increasing structure, and improving memory; Maintain abstinence from alcohol indefinitely due to compulsive drinking patterns; Consider referral to a therapist specializing in OCD treatment within next 60 days in order to receive assistance in addressing impulse control and curb related behavior; Seek hospitalization with assistance from EMS services if suicidal thoughts or behaviors reappear and safety of self or others is determined to be at risk.  Carmaleta reported progress in maintaining appointments with psychiatrist and following medication regimen, and noted that since moving in with her mother, financial stress is not as severe, which has lowered both depression and anxiety levels.  She reported that OCD behavior continues to be a challenge, is aware that this clinician does not specialize in treatment of this, and will consider referral over following weeks depending on how much distress current habits influence.  Kashae reported that she remains in recovery from alcohol and has not relapsed.  She reported that she would schedule for another followup appointment soon to update clinical assessment once she looks at her schedule.  Clinician will continue to monitor.   ?  ?Plan: Follow up again virtually in 2 weeks.   ? ?Diagnosis: Major depressive disorder, recurrent, moderate, without psychotic features; Obsessive Compulsive Disorder, unspecified; and Alcohol Use Disorder, moderate, in early remission ? ?Collaboration of Care:   No collaboration of care was required for this visit.  Camelia was encouraged to continue following up with Dr. Adele Schilder for psychiatrist/medication  management.   ?  ?Patient/Guardian was advised Release of Information must be obtained prior to any record release in order to collaborate their care with an outside provider. Patient/Guardian was advised if they have not already done so to contact the registration department to sign all necessary forms in order for [place] to release information regarding their care.  ?  ?Consent: Patient/Guardian gives verbal consent for treatment and assignment of benefits for services provided during this visit. Patient/Guardian expressed understanding and agreed to proceed. ?  ?Shade Flood, LCSW, LCAS ?12/19/21 ?

## 2021-12-25 ENCOUNTER — Other Ambulatory Visit (HOSPITAL_COMMUNITY): Payer: Self-pay

## 2021-12-26 ENCOUNTER — Other Ambulatory Visit (HOSPITAL_COMMUNITY): Payer: Self-pay

## 2021-12-27 ENCOUNTER — Other Ambulatory Visit (HOSPITAL_COMMUNITY): Payer: Self-pay

## 2021-12-28 ENCOUNTER — Other Ambulatory Visit (HOSPITAL_COMMUNITY): Payer: Self-pay

## 2022-01-07 ENCOUNTER — Other Ambulatory Visit (HOSPITAL_COMMUNITY): Payer: Self-pay

## 2022-01-08 ENCOUNTER — Other Ambulatory Visit (HOSPITAL_COMMUNITY): Payer: Self-pay

## 2022-01-22 ENCOUNTER — Other Ambulatory Visit (HOSPITAL_COMMUNITY): Payer: Self-pay

## 2022-02-04 ENCOUNTER — Other Ambulatory Visit (HOSPITAL_COMMUNITY): Payer: Self-pay

## 2022-02-06 ENCOUNTER — Other Ambulatory Visit (HOSPITAL_COMMUNITY): Payer: Self-pay

## 2022-02-12 ENCOUNTER — Ambulatory Visit: Payer: Self-pay | Admitting: Internal Medicine

## 2022-02-12 IMAGING — CT CT ANGIO NECK
2 of 12 series · 6 of 33 positions shown · IV contrast (APPLIED)
Comparison: March 21, 2016.

CLINICAL DATA: Concern for vertebral artery dissection.

EXAM:
CT ANGIOGRAPHY HEAD AND NECK
TECHNIQUE: Multidetector CT imaging of the head and neck was performed using
the standard protocol during bolus administration of intravenous
contrast. Multiplanar CT image reconstructions and MIPs were
obtained to evaluate the vascular anatomy. Carotid stenosis
measurements (when applicable) are obtained utilizing NASCET
criteria, using the distal internal carotid diameter as the
denominator.
CONTRAST:  75mL OMNIPAQUE IOHEXOL 350 MG/ML SOLN

[Series 10: cta head & neck · axial · 0.36mm/px · z∈[-291,-84]mm · 4 of 701 slices shown]
[im 141/701  soft-tissue]
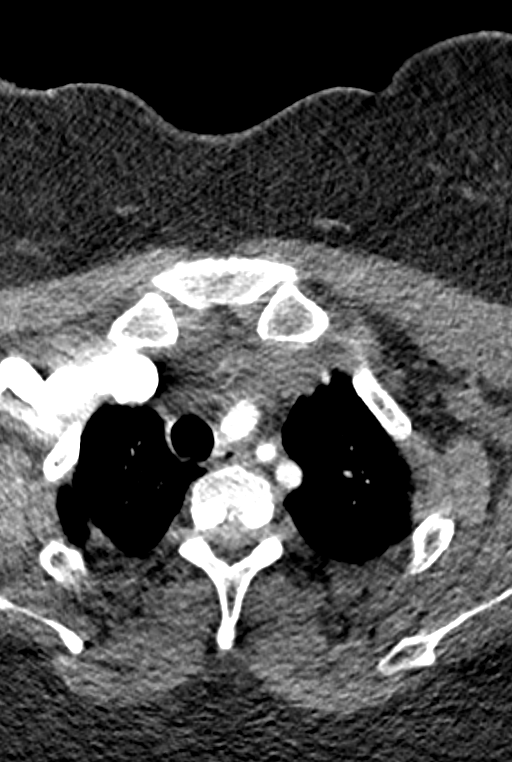
[im 281/701  bone]
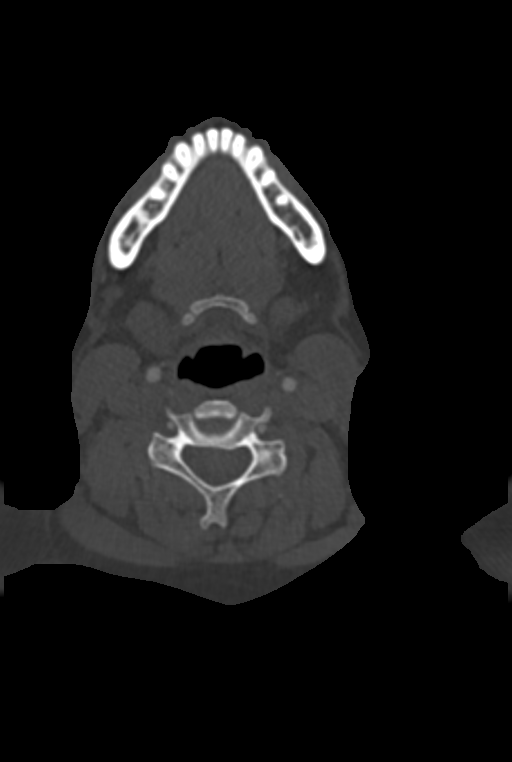
[im 421/701  soft-tissue]
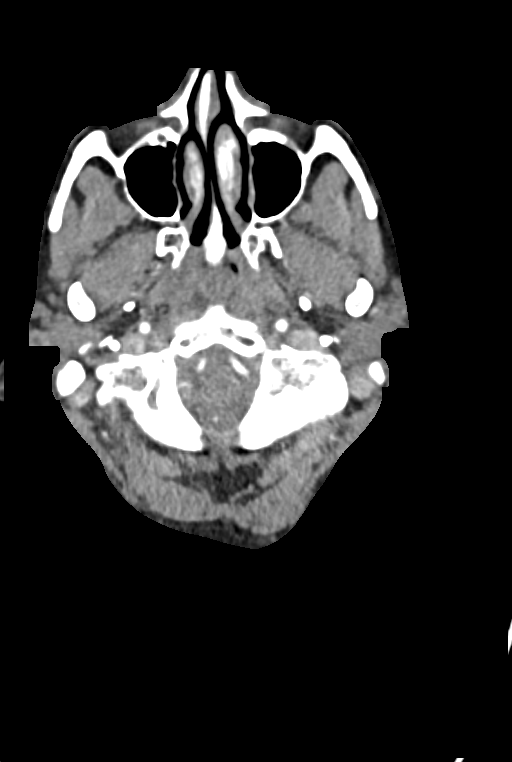
[im 561/701  bone]
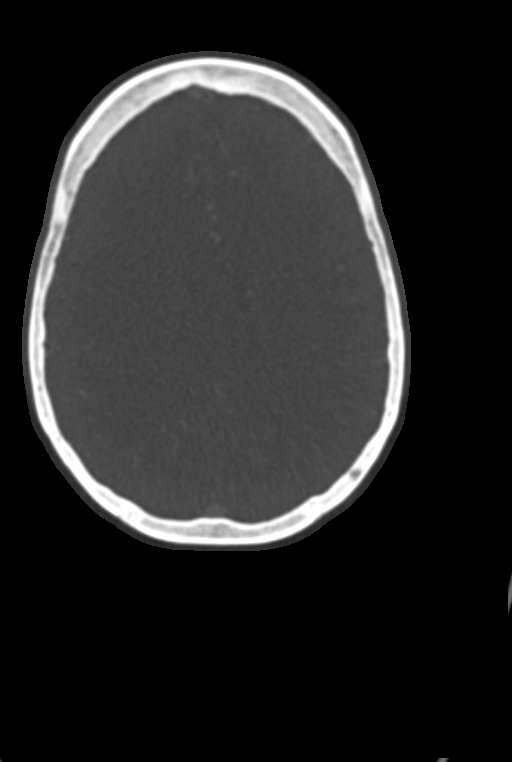

[Series 11: ax thin · axial · 0.36mm/px · z∈[-246,-131]mm · 2 of 351 slices shown]
[im 117/351  soft-tissue]
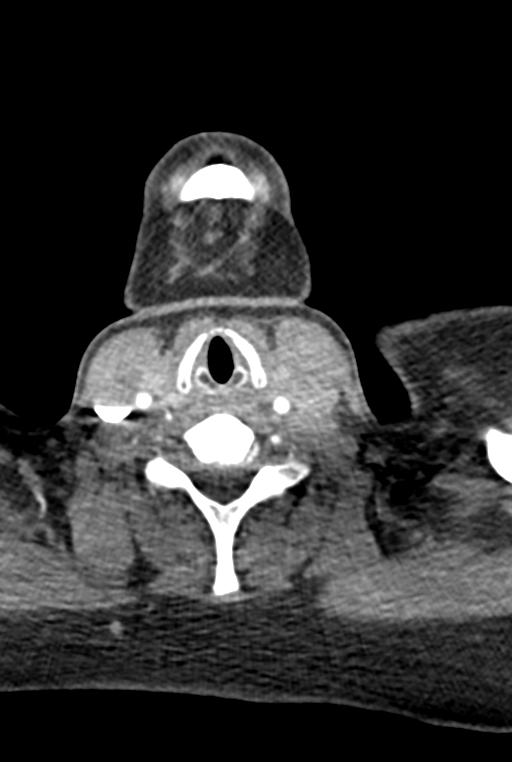
[im 234/351  soft-tissue]
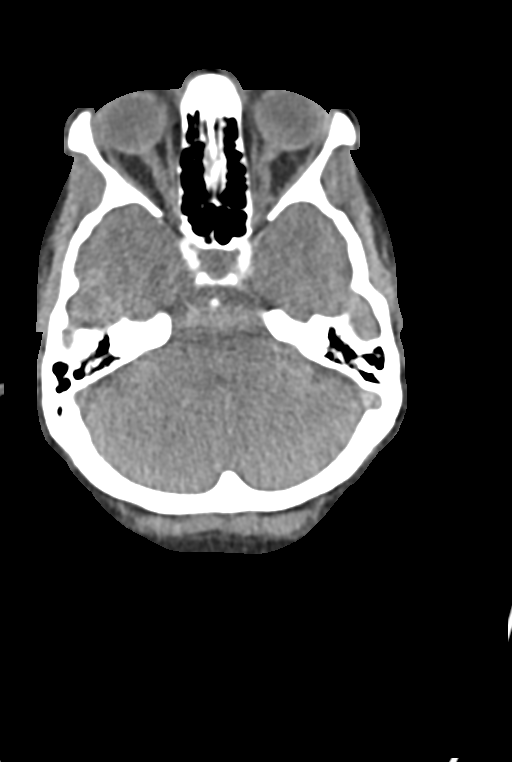

[6 of 33 positions shown; findings below may reference images not displayed]

FINDINGS: CT HEAD FINDINGS

Brain: No evidence of acute large vascular territory infarction,
hemorrhage, hydrocephalus, extra-axial collection or mass
lesion/mass effect.

Vascular: See below

Skull: No acute fracture.

Sinuses: Visualized sinuses are clear.

Other: No mastoid effusions.

Review of the MIP images confirms the above findings

CTA NECK FINDINGS

Aortic arch: Great vessel origins are patent.

Right carotid system: Mild irregularity of the right ICA at the
C1-C2 level (see series 10, images 338 through 342 and series 13,
image 56) without intimal flap, pseudoaneurysm, or significant
stenosis to suggest vessel injury.

Left carotid system: No evidence of dissection, stenosis (50% or
greater) or occlusion.

Vertebral arteries: Codominant. No evidence of dissection, stenosis
(50% or greater) or occlusion.

Skeleton: No acute abnormality.

Other neck: No acute abnormality.

Upper chest: Mild mosaic attenuation of the visualized lung apices
likely is related to expiratory imaging.

Review of the MIP images confirms the above findings

CTA HEAD FINDINGS

Limited evaluation due to motion.

Anterior circulation: Bilateral ICAs and proximal MCAs and ACAs are
patent. Limited distal vessel evaluation due to motion. No visible
aneurysm.

Posterior circulation: Bilateral intradural vertebral arteries,
basilar artery, and proximal posterior cerebral arteries are patent
without high-grade stenosis. Distal posterior cerebral artery
evaluation is limited due to motion.

Venous sinuses: Limited evaluation due to arterial timing.

Review of the MIP images confirms the above findings
IMPRESSION: CTA Neck:

Mild irregularity of the right ICA at the C1-C2 level without
intimal flap, pseudoaneurysm, or significant stenosis to suggest
vessel injury. Additionally, there is streak artifact in this
region, which may accentuate the finding. If there is high clinical
concern, a follow-up CTA in 24-48 hours could evaluate for change.

CTA Head:

Limited evaluation due to motion without large vessel occlusion or
proximal high-grade stenosis.

## 2022-02-15 ENCOUNTER — Other Ambulatory Visit (HOSPITAL_COMMUNITY): Payer: Self-pay

## 2022-02-15 ENCOUNTER — Other Ambulatory Visit: Payer: Self-pay | Admitting: Family Medicine

## 2022-02-18 ENCOUNTER — Other Ambulatory Visit (HOSPITAL_COMMUNITY): Payer: Self-pay

## 2022-02-18 MED ORDER — VITAMIN D (ERGOCALCIFEROL) 1.25 MG (50000 UNIT) PO CAPS
50000.0000 [IU] | ORAL_CAPSULE | ORAL | 3 refills | Status: DC
  Filled 2022-02-18: qty 8, 28d supply, fill #0
  Filled 2022-03-16: qty 8, 28d supply, fill #1
  Filled 2022-07-17: qty 8, 28d supply, fill #2

## 2022-02-18 MED ORDER — CYANOCOBALAMIN 1000 MCG/ML IJ SOLN
1.0000 mg | INTRAMUSCULAR | 6 refills | Status: DC
  Filled 2022-02-18: qty 2, 28d supply, fill #0
  Filled 2022-03-16: qty 2, 28d supply, fill #1
  Filled 2022-06-25: qty 2, 28d supply, fill #2
  Filled 2022-07-17: qty 2, 28d supply, fill #3
  Filled 2022-08-26 – 2022-09-26 (×2): qty 2, 28d supply, fill #4
  Filled 2022-11-02: qty 2, 28d supply, fill #5

## 2022-02-21 ENCOUNTER — Other Ambulatory Visit (HOSPITAL_COMMUNITY): Payer: Self-pay

## 2022-03-05 ENCOUNTER — Other Ambulatory Visit (HOSPITAL_COMMUNITY): Payer: Self-pay

## 2022-03-07 ENCOUNTER — Other Ambulatory Visit (HOSPITAL_COMMUNITY): Payer: Self-pay

## 2022-03-08 ENCOUNTER — Other Ambulatory Visit (HOSPITAL_COMMUNITY): Payer: Self-pay

## 2022-03-12 ENCOUNTER — Ambulatory Visit (INDEPENDENT_AMBULATORY_CARE_PROVIDER_SITE_OTHER): Payer: Commercial Managed Care - PPO | Admitting: Family Medicine

## 2022-03-12 ENCOUNTER — Encounter: Payer: Self-pay | Admitting: Family Medicine

## 2022-03-12 VITALS — BP 132/81 | HR 80 | Temp 98.6°F | Ht 65.5 in | Wt 261.2 lb

## 2022-03-12 DIAGNOSIS — K909 Intestinal malabsorption, unspecified: Secondary | ICD-10-CM

## 2022-03-12 DIAGNOSIS — E538 Deficiency of other specified B group vitamins: Secondary | ICD-10-CM

## 2022-03-12 DIAGNOSIS — Z0000 Encounter for general adult medical examination without abnormal findings: Secondary | ICD-10-CM | POA: Diagnosis not present

## 2022-03-12 DIAGNOSIS — Z9884 Bariatric surgery status: Secondary | ICD-10-CM | POA: Diagnosis not present

## 2022-03-12 DIAGNOSIS — Z1211 Encounter for screening for malignant neoplasm of colon: Secondary | ICD-10-CM | POA: Diagnosis not present

## 2022-03-12 DIAGNOSIS — Z1231 Encounter for screening mammogram for malignant neoplasm of breast: Secondary | ICD-10-CM

## 2022-03-12 DIAGNOSIS — I10 Essential (primary) hypertension: Secondary | ICD-10-CM

## 2022-03-12 DIAGNOSIS — E559 Vitamin D deficiency, unspecified: Secondary | ICD-10-CM

## 2022-03-12 DIAGNOSIS — F411 Generalized anxiety disorder: Secondary | ICD-10-CM

## 2022-03-12 DIAGNOSIS — E161 Other hypoglycemia: Secondary | ICD-10-CM | POA: Diagnosis not present

## 2022-03-12 DIAGNOSIS — Z79899 Other long term (current) drug therapy: Secondary | ICD-10-CM

## 2022-03-12 LAB — LIPID PANEL
Cholesterol: 264 mg/dL — ABNORMAL HIGH (ref 0–200)
HDL: 92 mg/dL (ref >39.00)
NonHDL: 171.67
Total CHOL/HDL Ratio: 3
Triglycerides: 226 mg/dL — ABNORMAL HIGH (ref 0.0–149.0)
VLDL: 45.2 mg/dL — ABNORMAL HIGH (ref 0.0–40.0)

## 2022-03-12 LAB — CBC
HCT: 39.2 % (ref 36.0–46.0)
Hemoglobin: 12.7 g/dL (ref 12.0–15.0)
MCHC: 32.5 g/dL (ref 30.0–36.0)
MCV: 89.9 fl (ref 78.0–100.0)
Platelets: 342 10*3/uL (ref 150.0–400.0)
RBC: 4.36 Mil/uL (ref 3.87–5.11)
RDW: 14.8 % (ref 11.5–15.5)
WBC: 10.1 10*3/uL (ref 4.0–10.5)

## 2022-03-12 LAB — LDL CHOLESTEROL, DIRECT: Direct LDL: 122 mg/dL

## 2022-03-12 LAB — VITAMIN D 25 HYDROXY (VIT D DEFICIENCY, FRACTURES): VITD: 31.33 ng/mL (ref 30.00–100.00)

## 2022-03-12 LAB — BASIC METABOLIC PANEL
BUN: 10 mg/dL (ref 6–23)
CO2: 26 mEq/L (ref 19–32)
Calcium: 9.5 mg/dL (ref 8.4–10.5)
Chloride: 99 mEq/L (ref 96–112)
Creatinine, Ser: 0.76 mg/dL (ref 0.40–1.20)
GFR: 93.99 mL/min (ref >60.00)
Glucose, Bld: 96 mg/dL (ref 70–99)
Potassium: 4.5 mEq/L (ref 3.5–5.1)
Sodium: 137 mEq/L (ref 135–145)

## 2022-03-12 LAB — VITAMIN B12: Vitamin B-12: 241 pg/mL (ref 211–911)

## 2022-03-12 LAB — HEMOGLOBIN A1C: Hgb A1c MFr Bld: 5.4 % (ref 4.6–6.5)

## 2022-03-12 NOTE — Progress Notes (Signed)
Office Note 03/12/2022  CC:  Chief Complaint  Patient presents with   Annual Exam    Pt is not fasting. Declined colon cancer screening at this time.    Patient is a 46 y.o. female who is here for annual health maintenance exam and follow-up hypertension, reactive hypoglycemia, history of gastric bypass, and generalized anxiety disorder. A/P as of last visit: "#1 hypertension, well controlled. Continue lisinopril 20 mg a day. Most recent creatinine about a month ago slightly up from her baseline so I am repeating it today. Potassium was borderline as well and she has since stopped taking any supplement.  2.  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 She asks that I assume responsibility for prescribing this medication today since she lost health insurance and has not followed up with her GYN MD. I agreed to do this today and sent this medication in for 1 month supply with 6 refills.   #3 reactive hypoglycemia. She now has an appointment with Dr. Lonzo Cloud in endocrinology to establish care--- 02/12/2022. This is great"  INTERIM HX: -Is feeling pretty well today. She still has problems with reactive hypoglycemia. She has discovered that she seems to have some obsessive-compulsive tendencies surrounding her food/eating.  This was diagnosed by her psychiatrist. Patient canceled her appointment with Dr. Lonzo Cloud and is rescheduled for November.   She has a new job with [employer] and is happy about this.  Home blood pressures have been consistently normal.  She takes duloxetine and lamotrigine through her psychiatrist. I have been prescribing her alprazolam long-term and she takes 2 mg twice daily. PMP AWARE reviewed today: most recent rx for alprazolam was filled 03/07/22, # 120, rx by me. No red flags.  Past Medical History:  Diagnosis Date   Acute renal failure (HCC) 04/26/2021   prerenal azotemia    Allergic rhinitis    Altered sensorium due to hypoglycemia 03/2016   Anxiety and depression    started with PPD   Chronic neck pain    Myofascial pain syndrome per [facility] [facility], oxycodone per their office pain med contract.  (Normal cervical MRI 2010 per ortho notes).   Easy bruisability 2011   Hx of: saw Hematologist (Dr. Cyndie Chime) and w/u neg for von Willebrand desease.   GERD (gastroesophageal reflux disease)    hx gastric ulcer and upper GI bleed, ? 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 Pt to start FeSO4 325 qd 10/28/19   Irregular menses 03/2018   GYN started [facility] 0.25-0.35.  [facility] as of 10/2018.  u/s wnl.  Endom bx NEG. OCPs helped.   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 Everardo All   Seizure [facility]) 2015   s/p eval with neuro, no medication, no events since   Syncope    with ? seizure in 2015, eval with neruo   Vitamin B12 deficiency    Vitamin D deficiency     Past Surgical History:  Procedure Laterality Date   CESAREAN SECTION  2006   ELBOW X-RAY Left  12/07/2019   ENDOMETRIAL BIOPSY     EYE SURGERY Bilateral    2001   FINGER X-RAY Right 12/07/2019   ROUX-EN-Y GASTRIC BYPASS  01/05/2002   TONSILLECTOMY AND ADENOIDECTOMY     TUBAL LIGATION  2006   tubes in ears both Bilateral     Family History  Problem Relation Age of Onset   Arthritis Mother    Hyperlipidemia Mother    Heart disease Mother    Diabetes Mother    Asthma Mother    Aneurysm Father        deceased   Lung cancer Maternal Aunt        smoker   Colon cancer Maternal Grandmother    Lung cancer Maternal Aunt        smoker     Social History   Socioeconomic History   Marital status: Legally Separated    Spouse name: Not on file   Number of children: Not on file   Years of education: Not on file   Highest education level: Associate degree: occupational, Scientist, product/process developmenttechnical, or vocational program  Occupational History   Not on file  Tobacco Use   Smoking status: Never   Smokeless tobacco: Never  Vaping Use   Vaping Use: Never used  Substance and Sexual Activity   Alcohol use: Yes    Alcohol/week: 0.0 standard drinks    Comment: occasional    Drug use: No   Sexual activity: Not on file  Other Topics Concern   Not on file  Social History Narrative   Married.   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   No T/A/Ds.   Social Determinants of Health   Financial Resource Strain: High Risk   Difficulty of Paying Living Expenses: Hard  Food Insecurity: Food Insecurity Present   Worried About Running Out of Food in the Last Year: Sometimes true   Ran Out of Food in the Last Year: Sometimes true  Transportation Needs: Unknown   Freight forwarderLack of Transportation (Medical): Not on file   Lack of Transportation (Non-Medical): No  Physical Activity: Insufficiently Active   Days of Exercise per Week: 3 days   Minutes of Exercise per Session: 30 min  Stress: Stress Concern Present   Feeling of Stress : Very much  Social Connections: Socially Isolated   Frequency of Communication with Friends and Family: Three times a week   Frequency of Social Gatherings with Friends and Family: Three times a week   Attends Religious Services: Never   Active Member of Clubs or Organizations: No   Attends Engineer, structuralClub or Organization Meetings: Not on file   Marital Status: Divorced  Intimate Partner Violence: Not on file    Outpatient Medications Prior to Visit  Medication Sig Dispense Refill   ALPRAZolam (XANAX) 1 MG tablet Take 2 tablets (2 mg total) by mouth in the morning AND 2 tablets (2 mg total) at bedtime. 120 tablet 5   Calcium 250  MG CAPS Take 1 capsule by mouth 3 (three) times daily.     cyanocobalamin (,VITAMIN B-12,) 1000 MCG/ML injection Inject 1 mL (1,000 mcg total) into the muscle every 2 weeks 10 mL 6   DULoxetine (CYMBALTA) 20 MG capsule Take 1 capsule (20 mg total) by mouth 2 (two) times daily. 60 capsule 2   Ensure Max Protein (ENSURE MAX PROTEIN) LIQD Take 11 oz by mouth daily as needed (low Glucose). Chocolate     fluticasone (FLONASE) 50 MCG/ACT nasal spray PLACE 2 SPRAYS INTO BOTH NOSTRILS DAILY.  16 g 6   gabapentin (NEURONTIN) 100 MG capsule Take 1 capsule (100 mg total) by mouth 4 (four) times daily. 120 capsule 3   glucose blood (FREESTYLE LITE) test strip USE TO CHECK BLOOD SUGAR 3 TIMES A DAY 200 strip 12   lamoTRIgine (LAMICTAL) 150 MG tablet Take 1 tablet (150 mg total) by mouth daily. 30 tablet 2   lisinopril (ZESTRIL) 20 MG tablet Take 1 tablet (20 mg total) by mouth daily. 90 tablet 3   montelukast (SINGULAIR) 10 MG tablet Take 1 tablet (10 mg total) by mouth at bedtime. 30 tablet 5   norgestimate-ethinyl estradiol (ORTHO-CYCLEN) 0.25-35 MG-MCG tablet Take 1 tablet by mouth daily. 84 tablet 6   nystatin-triamcinolone ointment (MYCOLOG) APPLY TO THE AFFECTED AREA(S) BY TOPICAL ROUTE 2 TIMES PER DAY AS NEEDED 30 g 0   omeprazole (PRILOSEC) 40 MG capsule Take 1 capsule (40 mg total) by mouth 2 (two) times daily. 180 capsule 3   oxyCODONE-acetaminophen (PERCOCET) 10-325 MG tablet Take 1 tablet by mouth every 6 (six) hours as needed for pain.     rizatriptan (MAXALT-MLT) 10 MG disintegrating tablet 1 tab po qd as needed for migraine headache.  May repeat dose in 2 hours if needed.  Max dose in 24h is 20 mg. 10 tablet 5   Vitamin D, Ergocalciferol, (DRISDOL) 1.25 MG (50000 UNIT) CAPS capsule Take 1 capsule (50,000 Units total) by mouth 2 (two) times a week. 24 capsule 3   norgestimate-ethinyl estradiol (ORTHO-CYCLEN) 0.25-35 MG-MCG tablet Take by mouth daily.     ferrous gluconate (FERGON) 324 MG tablet TAKE  1 TABLET (324 MG TOTAL) BY MOUTH DAILY WITH BREAKFAST. 90 tablet 3   Alcohol Swabs (ALCOHOL WIPES) 70 % PADS Use to clean finger before checking blood sugar, checks blood sugar 3-4 times daily 100 each 11   methocarbamol (ROBAXIN) 500 MG tablet Take 1 tablet (500 mg total) by mouth every 12 (twelve) hours as needed for muscle spasms. 20 tablet 0   potassium chloride SA (K-DUR) 20 MEQ tablet TAKE 1 TABLET BY MOUTH DAILY. 90 tablet 1   No facility-administered medications prior to visit.    Allergies  Allergen Reactions   Ibuprofen     Causes ulcers    ROS Review of Systems  Constitutional:  Negative for appetite change, chills, fatigue and fever.  HENT:  Negative for congestion, dental problem, ear pain and sore throat.   Eyes:  Negative for discharge, redness and visual disturbance.  Respiratory:  Negative for cough, chest tightness, shortness of breath and wheezing.   Cardiovascular:  Negative for chest pain, palpitations and leg swelling.  Gastrointestinal:  Negative for abdominal pain, blood in stool, diarrhea, nausea and vomiting.  Genitourinary:  Negative for difficulty urinating, dysuria, flank pain, frequency, hematuria and urgency.  Musculoskeletal:  Negative for arthralgias, back pain, joint swelling, myalgias and neck stiffness.  Skin:  Negative for pallor and rash.  Neurological:  Negative for dizziness, speech difficulty, weakness and headaches.  Hematological:  Negative for adenopathy. Does not bruise/bleed easily.  Psychiatric/Behavioral:  Negative for confusion and sleep disturbance. The patient is not nervous/anxious.    PE;    03/12/2022    8:26 AM 12/17/2021    4:38 PM 12/07/2021    2:35 PM  Vitals with BMI  Height 5' 5.5"  5' 4.5"  Weight 261 lbs 3 oz  267 lbs 6 oz  BMI 42.79  45.21  Systolic 132  137  Diastolic 81  89  Pulse 80  69     Information is confidential and restricted. Go to Review Flowsheets to unlock data.     Exam chaperoned by Harlene Salts,  CMA Gen: Alert, well appearing.  Patient is oriented to person, place, time, and situation. AFFECT: pleasant, lucid thought and speech. ENT: Ears: EACs clear, normal epithelium.  TMs with good light reflex and landmarks bilaterally.  Eyes: no injection, icteris, swelling, or exudate.  EOMI, PERRLA. Nose: no drainage or turbinate edema/swelling.  No injection or focal lesion.  Mouth: lips without lesion/swelling.  Oral mucosa pink and moist.  Dentition intact and without obvious caries or gingival swelling.  Oropharynx without erythema, exudate, or swelling.  Neck: supple/nontender.  No LAD, mass, or TM.  Carotid pulses 2+ bilaterally, without bruits. CV: RRR, no m/r/g.   LUNGS: CTA bilat, nonlabored resps, good aeration in all lung fields. ABD: soft, NT, ND, BS normal.  No hepatospenomegaly or mass.  No bruits. EXT: no clubbing, cyanosis, or edema.  Musculoskeletal: no joint swelling, erythema, warmth, or tenderness.  ROM of all joints intact. Skin - no sores or suspicious lesions or rashes or color changes  Pertinent labs:  Lab Results  Component Value Date   TSH 1.42 11/09/2021   Lab Results  Component Value Date   WBC 11.0 (H) 11/09/2021   HGB 13.7 11/09/2021   HCT 41.5 11/09/2021   MCV 89.2 11/09/2021   PLT 406 (H) 11/09/2021   Lab Results  Component Value Date   IRON 153 11/09/2021   TIBC 532 (H) 11/09/2021   FERRITIN 7 (L) 11/09/2021   Lab Results  Component Value Date   CREATININE 0.86 12/07/2021   BUN 14 12/07/2021   NA 139 12/07/2021   K 4.5 12/07/2021   CL 103 12/07/2021   CO2 22 12/07/2021   Lab Results  Component Value Date   ALT 9 11/09/2021   AST 15 11/09/2021   ALKPHOS 45 05/10/2021   BILITOT 0.5 11/09/2021   Lab Results  Component Value Date   CHOL 223 (H) 11/24/2020   Lab Results  Component Value Date   HDL 72.80 11/24/2020   Lab Results  Component Value Date   LDLCALC 89 02/06/2018   Lab Results  Component Value Date   TRIG 329.0 (H)  11/24/2020   Lab Results  Component Value Date   CHOLHDL 3 11/24/2020   Lab Results  Component Value Date   HGBA1C 4.6 08/07/2017   Lab Results  Component Value Date   VITAMINB12 491 11/09/2021   Last vitamin D Lab Results  Component Value Date   VD25OH 47 11/09/2021   ASSESSMENT AND PLAN:   #1 hypertension, well controlled on lisinopril 20 mg a day. Electrolytes and creatinine today.  2.  Generalized anxiety disorder.  Doing well with alprazolam 2 mg twice daily. Additional psychiatric diagnoses treated and followed by her psychiatrist Dr. Lolly Mustache. Urine drug screen today.  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 She has some malabsorption associated with this, history of vitamin D, iron, and B12 deficiency. Checking vitamin D and B12 levels today. Her hemoglobin was 13.7 about 4 months ago.  At that time her ferritin was 7. She will remain on vitamin D 50,000 unit tab twice daily, ferrous gluconate 324 mg daily, and 1000 mcg vitamin B12 injection every 2 weeks.  #4 reactive hypoglycemia.  Associated with #3 above. She is trying to eat frequent small meals. She is getting established with a new endocrinologist in November since her current 1 is retiring.  Nonfasting glucose and hemoglobin A1c today.  #4 Health maintenance exam: Reviewed age and gender appropriate health maintenance issues (prudent diet, regular exercise, health risks of tobacco and excessive alcohol, use of seatbelts, fire alarms in home, use of sunscreen).  Also reviewed age and gender appropriate health screening as well as vaccine recommendations. Vaccines: UTD Labs: lipid, cbc,bmet,vit b12, vit D. Cervical ca screening: per gyn Breast ca screening: per gyn. Colon ca screening: due for initial screening.  Options discussed--->cologuard chosen.  An After Visit Summary was printed and given to the patient.  FOLLOW UP:  Return in about 4 months (around 07/12/2022) for routine chronic illness  f/u.  Signed:  Santiago Bumpers, MD           03/12/2022

## 2022-03-12 NOTE — Patient Instructions (Signed)

## 2022-03-13 ENCOUNTER — Encounter: Payer: Self-pay | Admitting: Family Medicine

## 2022-03-16 ENCOUNTER — Other Ambulatory Visit: Payer: Self-pay | Admitting: Family Medicine

## 2022-03-18 ENCOUNTER — Other Ambulatory Visit (HOSPITAL_COMMUNITY): Payer: Self-pay

## 2022-03-18 MED ORDER — FERROUS GLUCONATE 324 (38 FE) MG PO TABS
324.0000 mg | ORAL_TABLET | Freq: Every day | ORAL | 1 refills | Status: DC
  Filled 2022-03-18: qty 90, 90d supply, fill #0
  Filled 2022-04-03: qty 30, 30d supply, fill #0
  Filled 2022-07-21: qty 60, 60d supply, fill #0

## 2022-03-19 ENCOUNTER — Other Ambulatory Visit (HOSPITAL_COMMUNITY): Payer: Self-pay

## 2022-03-19 ENCOUNTER — Encounter (HOSPITAL_COMMUNITY): Payer: Self-pay | Admitting: Psychiatry

## 2022-03-19 ENCOUNTER — Telehealth (HOSPITAL_BASED_OUTPATIENT_CLINIC_OR_DEPARTMENT_OTHER): Payer: Commercial Managed Care - PPO | Admitting: Psychiatry

## 2022-03-19 DIAGNOSIS — F419 Anxiety disorder, unspecified: Secondary | ICD-10-CM | POA: Diagnosis not present

## 2022-03-19 DIAGNOSIS — F331 Major depressive disorder, recurrent, moderate: Secondary | ICD-10-CM | POA: Diagnosis not present

## 2022-03-19 LAB — DRUG MONITORING PANEL 376104, URINE
Alphahydroxyalprazolam: NEGATIVE ng/mL (ref <25)
Alphahydroxymidazolam: NEGATIVE ng/mL (ref <50)
Alphahydroxytriazolam: NEGATIVE ng/mL (ref <50)
Aminoclonazepam: NEGATIVE ng/mL (ref <25)
Amphetamines: NEGATIVE ng/mL (ref <500)
Barbiturates: NEGATIVE ng/mL (ref <300)
Benzodiazepines: NEGATIVE ng/mL (ref <100)
Cocaine Metabolite: NEGATIVE ng/mL (ref <150)
Codeine: NEGATIVE ng/mL (ref <50)
Desmethyltramadol: NEGATIVE ng/mL (ref <100)
Hydrocodone: NEGATIVE ng/mL (ref <50)
Hydromorphone: NEGATIVE ng/mL (ref <50)
Hydroxyethylflurazepam: NEGATIVE ng/mL (ref <50)
Lorazepam: NEGATIVE ng/mL (ref <50)
Morphine: NEGATIVE ng/mL (ref <50)
Nordiazepam: NEGATIVE ng/mL (ref <50)
Norhydrocodone: NEGATIVE ng/mL (ref <50)
Noroxycodone: 4883 ng/mL — ABNORMAL HIGH (ref <50)
Opiates: NEGATIVE ng/mL (ref <100)
Oxazepam: NEGATIVE ng/mL (ref <50)
Oxycodone: 671 ng/mL — ABNORMAL HIGH (ref <50)
Oxycodone: POSITIVE ng/mL — AB (ref <100)
Oxymorphone: 2030 ng/mL — ABNORMAL HIGH (ref <50)
Temazepam: NEGATIVE ng/mL (ref <50)
Tramadol: NEGATIVE ng/mL (ref <100)

## 2022-03-19 LAB — DM TEMPLATE

## 2022-03-19 MED ORDER — LAMOTRIGINE 150 MG PO TABS
150.0000 mg | ORAL_TABLET | Freq: Every day | ORAL | 2 refills | Status: DC
  Filled 2022-03-19 – 2022-03-28 (×2): qty 30, 30d supply, fill #0
  Filled 2022-04-24: qty 30, 30d supply, fill #1
  Filled 2022-05-29: qty 30, 30d supply, fill #2

## 2022-03-19 MED ORDER — DULOXETINE HCL 20 MG PO CPEP
20.0000 mg | ORAL_CAPSULE | Freq: Two times a day (BID) | ORAL | 2 refills | Status: DC
  Filled 2022-03-19 – 2022-03-28 (×2): qty 60, 30d supply, fill #0
  Filled 2022-05-10: qty 60, 30d supply, fill #1
  Filled 2022-06-07: qty 60, 30d supply, fill #2

## 2022-03-19 NOTE — Progress Notes (Signed)
Virtual Visit via Telephone Note  I connected with Drinda Butts on 03/19/22 at  4:20 PM EDT by telephone and verified that I am speaking with the correct person using two identifiers.  Location: Patient: In Car Provider: Home Office   I discussed the limitations, risks, security and privacy concerns of performing an evaluation and management service by telephone and the availability of in person appointments. I also discussed with the patient that there may be a patient responsible charge related to this service. The patient expressed understanding and agreed to proceed.   History of Present Illness: Patient is evaluated by phone session.  She is relieved as recently received the job offer from [employer] and she has been working there for 2 weeks.  She is happy that she is able to go out and keep herself busy.  She also needed money because of financial strain and so far her job is challenging but manageable.  She still have episodes of hypoglycemic events and her PCP recommended to see endocrinologist.  She recently had blood work and her hemoglobin A1c is 5.4.  She is taking Cymbalta and Lamictal which is helping her depression, anxiety and she denies any crying spells or any suicidal thoughts.  She denies any panic attack.  Her appetite is okay.  Her weight is stable.  She had also applied for disability but does not want to wait and once she received a job offer she went back to work.  She admitted not able to keep the appointment with Denyse Amass but also noticed things are much better and like to keep the current medication.  Past Psychiatric History: H/O depression after got married.  Tried Zoloft for years but stopped after seizure. PCP tried trazodone, Ambien, Doxepin and Seroquel but  did not work.  We tried higher dose of Cymbalta but developed side effects.  H/O irritability, poor impulse control with excessive buying and shopping but no history of mania, psychosis, suicidal attempt or  inpatient treatment.  Psychiatric Specialty Exam: Physical Exam  Review of Systems  Weight 261 lb (118.4 kg).There is no height or weight on file to calculate BMI.  General Appearance: NA  Eye Contact:  NA  Speech:  Clear and Coherent and Normal Rate  Volume:  Normal  Mood:  Euthymic  Affect:  NA  Thought Process:  Coherent  Orientation:  Full (Time, Place, and Person)  Thought Content:  Logical  Suicidal Thoughts:  No  Homicidal Thoughts:  No  Memory:  Immediate;   Good Recent;   Good Remote;   Good  Judgement:  Intact  Insight:  Present  Psychomotor Activity:  Normal  Concentration:  Concentration: Good and Attention Span: Good  Recall:  Good  Fund of Knowledge:  Good  Language:  Good  Akathisia:  No  Handed:  Right  AIMS (if indicated):     Assets:  Communication Skills Desire for Improvement Housing Social Support Talents/Skills  ADL's:  Intact  Cognition:  WNL  Sleep:   good      Assessment and Plan: Major depressive disorder, recurrent.  Anxiety.  Patient doing much better since she started working 2 weeks ago at [employer].  Her job is challenging but manageable.  She is getting Xanax from her PCP Dr. Milinda Cave and also she is taking pain medicine from pain specialist.  She has chronic arthritis pain.  Recommended to continue Cymbalta 20 mg 2 times a day and Lamictal 150 mg daily.  Patient like to hold on  for therapy since she is more busy at work.  I recommended to call us back if she has any question, concern or if she decided to go back to therapy.  Follow-up in 3 months.  Follow Up Instructions:    I discussed the assessment and treatment plan with the patient. The patient was provided an opportunity to ask questions and all were answered. The patient agreed with the plan and demonstrated an understanding of the instructions.   The patient was advised to call back or seek an in-person evaluation if the symptoms worsen or if the condition fails to  improve as anticipated.  Collaboration of Care: Primary Care Provider AEB notes are available in epic to review.  Patient/Guardian was advised Release of Information must be obtained prior to any record release in order to collaborate their care with an outside provider. Patient/Guardian was advised if they have not already done so to contact the registration department to sign all necessary forms in order for [place] to release information regarding their care.   Consent: Patient/Guardian gives verbal consent for treatment and assignment of benefits for services provided during this visit. Patient/Guardian expressed understanding and agreed to proceed.    I provided 17 minutes of non-face-to-face time during this encounter.   Cleotis Nipper, MD

## 2022-03-20 ENCOUNTER — Other Ambulatory Visit (HOSPITAL_COMMUNITY): Payer: Self-pay

## 2022-03-20 ENCOUNTER — Other Ambulatory Visit: Payer: Self-pay | Admitting: Family Medicine

## 2022-03-20 ENCOUNTER — Encounter: Payer: Self-pay | Admitting: Family Medicine

## 2022-03-20 MED ORDER — ALPRAZOLAM 1 MG PO TABS
ORAL_TABLET | ORAL | 5 refills | Status: DC
  Filled 2022-03-20 – 2022-04-04 (×3): qty 120, 30d supply, fill #0
  Filled 2022-04-29 – 2022-05-02 (×3): qty 120, 30d supply, fill #1
  Filled 2022-05-30: qty 120, 30d supply, fill #2
  Filled 2022-05-30 – 2022-06-01 (×2): qty 120, 30d supply, fill #0
  Filled ????-??-??: fill #0

## 2022-03-20 MED ORDER — GABAPENTIN 100 MG PO CAPS
100.0000 mg | ORAL_CAPSULE | Freq: Four times a day (QID) | ORAL | 3 refills | Status: DC
  Filled 2022-03-20: qty 120, 30d supply, fill #0
  Filled 2022-04-30: qty 120, 30d supply, fill #1
  Filled 2022-06-25: qty 120, 30d supply, fill #2

## 2022-03-20 NOTE — Telephone Encounter (Signed)
She last filled this 03/07/2022. Too early for refill at this time. I did go ahead and send this prescription to the pharmacy today with instructions to "not fill earlier than 30 days from last refill".

## 2022-03-28 ENCOUNTER — Other Ambulatory Visit (HOSPITAL_COMMUNITY): Payer: Self-pay

## 2022-04-03 ENCOUNTER — Other Ambulatory Visit (HOSPITAL_COMMUNITY): Payer: Self-pay

## 2022-04-03 ENCOUNTER — Telehealth: Payer: Self-pay

## 2022-04-03 NOTE — Telephone Encounter (Signed)
Pt's last refill 03/20/22(120,5). Last seen by provider 6/6 and return follow up appt 10/13.   Please review and advise

## 2022-04-03 NOTE — Telephone Encounter (Signed)
Yes okay to fill alprazolam on Friday, 04/05/2022

## 2022-04-03 NOTE — Telephone Encounter (Signed)
Spoke with pharmacy regarding results/recommendations.

## 2022-04-03 NOTE — Telephone Encounter (Signed)
RpH calling from [facility], patient is scheduled to get Alprazolam filled on 7/1, they are closed on weekends. Asking approval from provider if they can fill prescription on Friday 6/30 for patient.  ALPRAZolam (XANAX) 1 MG tablet [[id number]]   Please call [phone]

## 2022-04-04 ENCOUNTER — Other Ambulatory Visit (HOSPITAL_COMMUNITY): Payer: Self-pay

## 2022-04-10 ENCOUNTER — Telehealth: Payer: Self-pay

## 2022-04-10 NOTE — Telephone Encounter (Signed)
noted 

## 2022-04-10 NOTE — Telephone Encounter (Signed)
Called pt to f/u for seizure had today. Pt stated that the fire dept and EMS had came out and check vitals and glucose.  Pt reported sugars to be at 174.  Pt declined to go to ED. Pt was recommended to follow up with PCP.

## 2022-04-11 ENCOUNTER — Ambulatory Visit: Payer: Commercial Managed Care - PPO | Admitting: Family Medicine

## 2022-04-12 ENCOUNTER — Other Ambulatory Visit (HOSPITAL_COMMUNITY): Payer: Self-pay

## 2022-04-25 ENCOUNTER — Other Ambulatory Visit (HOSPITAL_COMMUNITY): Payer: Self-pay

## 2022-04-29 ENCOUNTER — Other Ambulatory Visit (HOSPITAL_COMMUNITY): Payer: Self-pay

## 2022-04-30 ENCOUNTER — Other Ambulatory Visit (HOSPITAL_COMMUNITY): Payer: Self-pay

## 2022-05-02 ENCOUNTER — Encounter: Payer: Self-pay | Admitting: Family Medicine

## 2022-05-02 ENCOUNTER — Other Ambulatory Visit (HOSPITAL_COMMUNITY): Payer: Self-pay

## 2022-05-02 NOTE — Telephone Encounter (Signed)
Okay to fill alprazolam today.

## 2022-05-02 NOTE — Telephone Encounter (Signed)
Pharmacy notified.

## 2022-05-02 NOTE — Telephone Encounter (Signed)
Please advise 

## 2022-05-10 ENCOUNTER — Encounter: Payer: Self-pay | Admitting: Family Medicine

## 2022-05-10 ENCOUNTER — Other Ambulatory Visit (HOSPITAL_COMMUNITY): Payer: Self-pay

## 2022-05-10 ENCOUNTER — Other Ambulatory Visit: Payer: Self-pay

## 2022-05-10 MED ORDER — OMEPRAZOLE 40 MG PO CPDR: 40.0000 mg | DELAYED_RELEASE_CAPSULE | Freq: Two times a day (BID) | ORAL | 1 refills | Status: DC

## 2022-05-29 ENCOUNTER — Other Ambulatory Visit (HOSPITAL_COMMUNITY): Payer: Self-pay

## 2022-05-30 ENCOUNTER — Other Ambulatory Visit: Payer: Self-pay

## 2022-05-30 ENCOUNTER — Other Ambulatory Visit (HOSPITAL_COMMUNITY): Payer: Self-pay

## 2022-05-30 ENCOUNTER — Encounter: Payer: Self-pay | Admitting: Family Medicine

## 2022-05-30 MED ORDER — FLUTICASONE PROPIONATE 50 MCG/ACT NA SUSP: 2.0000 | Freq: Every day | NASAL | 6 refills | Status: DC

## 2022-05-30 MED ORDER — MONTELUKAST SODIUM 10 MG PO TABS: 10.0000 mg | ORAL_TABLET | Freq: Every day | ORAL | 5 refills | Status: DC

## 2022-05-30 MED ORDER — OMEPRAZOLE 40 MG PO CPDR
40.0000 mg | DELAYED_RELEASE_CAPSULE | Freq: Two times a day (BID) | ORAL | 1 refills | Status: DC
  Filled 2022-06-25 – 2022-07-17 (×2): qty 180, 90d supply, fill #0

## 2022-05-30 MED ORDER — ALPRAZOLAM 1 MG PO TABS
ORAL_TABLET | ORAL | 3 refills | Status: DC
Start: 2022-05-30 — End: 2022-08-16

## 2022-05-30 NOTE — Telephone Encounter (Signed)
Please review and advise. Med pending ?

## 2022-06-01 ENCOUNTER — Other Ambulatory Visit (HOSPITAL_COMMUNITY): Payer: Self-pay

## 2022-06-07 ENCOUNTER — Other Ambulatory Visit (HOSPITAL_COMMUNITY): Payer: Self-pay

## 2022-06-18 ENCOUNTER — Telehealth (HOSPITAL_COMMUNITY): Payer: Commercial Managed Care - PPO | Admitting: Psychiatry

## 2022-06-19 ENCOUNTER — Other Ambulatory Visit: Payer: Self-pay

## 2022-06-20 ENCOUNTER — Other Ambulatory Visit (HOSPITAL_COMMUNITY): Payer: Self-pay | Admitting: Psychiatry

## 2022-06-20 DIAGNOSIS — F331 Major depressive disorder, recurrent, moderate: Secondary | ICD-10-CM

## 2022-06-20 DIAGNOSIS — F419 Anxiety disorder, unspecified: Secondary | ICD-10-CM

## 2022-06-21 ENCOUNTER — Telehealth (HOSPITAL_COMMUNITY): Payer: Self-pay | Admitting: Psychiatry

## 2022-06-21 ENCOUNTER — Other Ambulatory Visit (HOSPITAL_COMMUNITY): Payer: Self-pay | Admitting: *Deleted

## 2022-06-21 ENCOUNTER — Other Ambulatory Visit (HOSPITAL_COMMUNITY): Payer: Self-pay

## 2022-06-21 DIAGNOSIS — F331 Major depressive disorder, recurrent, moderate: Secondary | ICD-10-CM

## 2022-06-21 DIAGNOSIS — F419 Anxiety disorder, unspecified: Secondary | ICD-10-CM

## 2022-06-21 MED ORDER — LAMOTRIGINE 150 MG PO TABS
150.0000 mg | ORAL_TABLET | Freq: Every day | ORAL | 0 refills | Status: DC
  Filled 2022-06-21: qty 21, 21d supply, fill #0

## 2022-06-21 MED ORDER — DULOXETINE HCL 20 MG PO CPEP
20.0000 mg | ORAL_CAPSULE | Freq: Two times a day (BID) | ORAL | 0 refills | Status: DC
  Filled 2022-06-21: qty 42, 21d supply, fill #0

## 2022-06-25 ENCOUNTER — Other Ambulatory Visit (HOSPITAL_COMMUNITY): Payer: Self-pay

## 2022-06-25 MED ORDER — OMEPRAZOLE 40 MG PO CPDR
40.0000 mg | DELAYED_RELEASE_CAPSULE | Freq: Two times a day (BID) | ORAL | 1 refills | Status: DC
  Filled 2022-06-25: qty 180, 90d supply, fill #0

## 2022-06-26 ENCOUNTER — Other Ambulatory Visit (HOSPITAL_COMMUNITY): Payer: Self-pay

## 2022-06-26 ENCOUNTER — Other Ambulatory Visit: Payer: Self-pay | Admitting: Family Medicine

## 2022-06-26 MED ORDER — LISINOPRIL 20 MG PO TABS
20.0000 mg | ORAL_TABLET | Freq: Every day | ORAL | 0 refills | Status: DC
  Filled 2022-06-26: qty 30, 30d supply, fill #0

## 2022-06-27 ENCOUNTER — Other Ambulatory Visit (HOSPITAL_COMMUNITY): Payer: Self-pay

## 2022-06-27 ENCOUNTER — Telehealth: Payer: Self-pay

## 2022-06-27 NOTE — Telephone Encounter (Signed)
Transition Care Management Unsuccessful Follow-up Telephone Call  Date of discharge and from where:  06/27/22; [facility]  Attempts:  1st Attempt  Reason for unsuccessful TCM follow-up call:  Left voice message

## 2022-06-28 ENCOUNTER — Other Ambulatory Visit (HOSPITAL_COMMUNITY): Payer: Self-pay

## 2022-07-01 ENCOUNTER — Encounter: Payer: Self-pay | Admitting: Family Medicine

## 2022-07-01 NOTE — Telephone Encounter (Signed)
No further action needed.

## 2022-07-01 NOTE — Telephone Encounter (Signed)
  ER vst (Newest Message First) View All Conversations on this Encounter Toni [name]  [facility] Clinical Pool (supporting Tammi Sou, MD) 1 hour ago (7:09 AM)    Good morning,  I rec a message from someone there reg a vet to the ER last Thursday. I left because it was taking forever and my sugar was coming back up. Please leave me a message on my voice mail or on here if you need to reach me.   Pt response via MyChart

## 2022-07-04 ENCOUNTER — Other Ambulatory Visit (HOSPITAL_COMMUNITY): Payer: Self-pay

## 2022-07-10 ENCOUNTER — Other Ambulatory Visit (HOSPITAL_COMMUNITY): Payer: Self-pay

## 2022-07-10 ENCOUNTER — Encounter (HOSPITAL_COMMUNITY): Payer: Self-pay | Admitting: Psychiatry

## 2022-07-10 ENCOUNTER — Telehealth (HOSPITAL_BASED_OUTPATIENT_CLINIC_OR_DEPARTMENT_OTHER): Payer: Self-pay | Admitting: Psychiatry

## 2022-07-10 DIAGNOSIS — F331 Major depressive disorder, recurrent, moderate: Secondary | ICD-10-CM

## 2022-07-10 DIAGNOSIS — F419 Anxiety disorder, unspecified: Secondary | ICD-10-CM

## 2022-07-10 MED ORDER — LAMOTRIGINE 150 MG PO TABS
150.0000 mg | ORAL_TABLET | Freq: Every day | ORAL | 0 refills | Status: DC
  Filled 2022-07-10: qty 90, 90d supply, fill #0

## 2022-07-10 MED ORDER — DULOXETINE HCL 40 MG PO CPEP
40.0000 mg | ORAL_CAPSULE | Freq: Every day | ORAL | 0 refills | Status: DC
  Filled 2022-07-10 – 2022-07-12 (×2): qty 30, 30d supply, fill #0
  Filled 2022-08-20 – 2022-08-22 (×2): qty 30, 30d supply, fill #1
  Filled 2022-09-26: qty 30, 30d supply, fill #2

## 2022-07-10 NOTE — Addendum Note (Signed)
Addended by: Berniece Andreas T on: 07/10/2022 01:57 PM   Modules accepted: Level of Service

## 2022-07-10 NOTE — Progress Notes (Signed)
Virtual Visit via Telephone Note  I connected with Toni [name] on 07/10/22 at  1:30 PM EDT by telephone and verified that I am speaking with the correct person using two identifiers.  Location: Patient: Work Provider: Biomedical scientist   I discussed the limitations, risks, security and privacy concerns of performing an evaluation and management service by telephone and the availability of in person appointments. I also discussed with the patient that there may be a patient responsible charge related to this service. The patient expressed understanding and agreed to proceed.   History of Present Illness: Patient is evaluated by phone session.  She was able to get a job at [employer] but her job was very stressful and after a few weeks she decided to go back to [facility] where she had worked in the past and they needed people.  She like her current job.  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  She also living with her aunt which is only 15 minutes away from her work.  So far she feels things are going very well.  She is taking Xanax prescribed by her PCP almost daily to help her anxiety.  She like to keep the Cymbalta and Lamictal but is keeping her mood stable.  She denies any crying spells or any feeling of hopelessness or worthlessness.  She had applied for disability but does not want to wait for the decision and working.  She has not decided to go back to [place] because things are going very well but will consider therapy if needed in the future.  Her appetite is okay.  Her sleep is good.  She denies any panic attack, crying spells or any feeling of hopelessness or worthlessness.  She has no rash, itching tremors or shakes.  She reported her job was very overwhelming and stressful  Past Psychiatric History: H/O depression after got married.  Tried Zoloft for years but stopped after seizure. PCP tried trazodone, Ambien, Doxepin  and Seroquel but  did not work.  We tried higher dose of Cymbalta but developed side effects.  H/O irritability, poor impulse control with excessive buying and shopping but no history of mania, psychosis, suicidal attempt or inpatient treatment.   Psychiatric Specialty Exam: Physical Exam  Review of Systems  Weight 261 lb (118.4 kg).There is no height or weight on file to calculate BMI.  General Appearance: NA  Eye Contact:  NA  Speech:  Clear and Coherent and Normal Rate  Volume:  Normal  Mood:  Anxious  Affect:  NA  Thought Process:  Goal Directed  Orientation:  Full (Time, Place, and Person)  Thought Content:  Logical  Suicidal Thoughts:  No  Homicidal Thoughts:  No  Memory:  Immediate;   Good Recent;   Good Remote;   Good  Judgement:  Intact  Insight:  Present  Psychomotor Activity:  NA  Concentration:  Concentration: Good and Attention Span: Good  Recall:  Good  Fund of Knowledge:  Good  Language:  Good  Akathisia:  No  Handed:  Right  AIMS (if indicated):     Assets:  Communication Skills Desire for Improvement Housing Talents/Skills Transportation  ADL's:  Intact  Cognition:  WNL  Sleep:   good      Assessment and Plan: Major depressive disorder, recurrent.  Anxiety.  Patient doing better since she is able to get a job at her previous employer at the [employer].  She is working in person in the billing department.  She does not want to change the medication.  Continue Cymbalta 40 mg daily and lamotrigine 150 mg daily.  She also takes Xanax prescribed by PCP daily to help with anxiety.  Recommended to call us back if she has any question or any concern.  I also recommend to call us back if she decided to go back to therapy with Denyse Amass.  Follow-up in 3 months.  Follow Up Instructions:    I discussed the assessment and treatment plan with the patient. The patient was provided an opportunity to ask questions and all were answered. The patient agreed with the  plan and demonstrated an understanding of the instructions.   The patient was advised to call back or seek an in-person evaluation if the symptoms worsen or if the condition fails to improve as anticipated.  Collaboration of Care: Other provider involved in patient's care AEB notes are available in epic to review.  Patient/Guardian was advised Release of Information must be obtained prior to any record release in order to collaborate their care with an outside provider. Patient/Guardian was advised if they have not already done so to contact the registration department to sign all necessary forms in order for [place] to release information regarding their care.   Consent: Patient/Guardian gives verbal consent for treatment and assignment of benefits for services provided during this visit. Patient/Guardian expressed understanding and agreed to proceed.    I provided 17 minutes of non-face-to-face time during this encounter.   Cleotis Nipper, MD

## 2022-07-11 ENCOUNTER — Other Ambulatory Visit (HOSPITAL_COMMUNITY): Payer: Self-pay

## 2022-07-12 ENCOUNTER — Other Ambulatory Visit (HOSPITAL_COMMUNITY): Payer: Self-pay

## 2022-07-17 ENCOUNTER — Other Ambulatory Visit: Payer: Self-pay

## 2022-07-17 ENCOUNTER — Other Ambulatory Visit (HOSPITAL_COMMUNITY): Payer: Self-pay

## 2022-07-18 ENCOUNTER — Other Ambulatory Visit (HOSPITAL_COMMUNITY): Payer: Self-pay

## 2022-07-19 ENCOUNTER — Ambulatory Visit: Payer: Commercial Managed Care - PPO | Admitting: Family Medicine

## 2022-07-21 ENCOUNTER — Other Ambulatory Visit: Payer: Self-pay | Admitting: Family Medicine

## 2022-07-22 ENCOUNTER — Other Ambulatory Visit (HOSPITAL_COMMUNITY): Payer: Self-pay

## 2022-07-22 MED ORDER — LISINOPRIL 20 MG PO TABS
20.0000 mg | ORAL_TABLET | Freq: Every day | ORAL | 0 refills | Status: DC
  Filled 2022-07-22 – 2022-07-23 (×2): qty 30, 30d supply, fill #0

## 2022-07-23 ENCOUNTER — Other Ambulatory Visit (HOSPITAL_COMMUNITY): Payer: Self-pay

## 2022-07-25 ENCOUNTER — Other Ambulatory Visit: Payer: Self-pay

## 2022-07-25 MED ORDER — NORGESTIMATE-ETH ESTRADIOL 0.25-35 MG-MCG PO TABS: 1.0000 | ORAL_TABLET | Freq: Every day | ORAL | 1 refills | Status: DC

## 2022-08-02 ENCOUNTER — Ambulatory Visit: Payer: Commercial Managed Care - PPO | Admitting: Family Medicine

## 2022-08-02 ENCOUNTER — Other Ambulatory Visit (HOSPITAL_COMMUNITY): Payer: Self-pay

## 2022-08-02 MED ORDER — NORGESTIMATE-ETH ESTRADIOL 0.25-35 MG-MCG PO TABS
1.0000 | ORAL_TABLET | Freq: Every day | ORAL | 4 refills | Status: DC
  Filled 2022-08-02: qty 28, 28d supply, fill #0

## 2022-08-02 MED ORDER — NORGESTIMATE-ETH ESTRADIOL 0.25-35 MG-MCG PO TABS
1.0000 | ORAL_TABLET | Freq: Every day | ORAL | 6 refills | Status: DC
  Filled 2022-08-02: qty 28, 28d supply, fill #0

## 2022-08-02 MED ORDER — FLUCONAZOLE 150 MG PO TABS
150.0000 mg | ORAL_TABLET | ORAL | 2 refills | Status: DC
  Filled 2022-08-02: qty 1, 1d supply, fill #0

## 2022-08-02 MED ORDER — METRONIDAZOLE 500 MG PO TABS
500.0000 mg | ORAL_TABLET | Freq: Two times a day (BID) | ORAL | 0 refills | Status: AC
  Filled 2022-08-02: qty 14, 7d supply, fill #0

## 2022-08-02 NOTE — Progress Notes (Deleted)
OFFICE VISIT  08/02/2022  CC: No chief complaint on file.   Patient is a 46 y.o. female who presents for 59-month follow-up hypertension, GAD, and malabsorption syndrome. A/P as of last visit: "#1 hypertension, well controlled on lisinopril 20 mg a day. Electrolytes and creatinine today.  2.  Generalized anxiety disorder.  Doing well with alprazolam 2 mg twice daily. Additional psychiatric diagnoses treated and followed by her psychiatrist Dr. Lolly Mustache. Urine drug screen today.   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 She has some malabsorption associated with this, history of vitamin D, iron, and B12 deficiency. Checking vitamin D and B12 levels today. Her hemoglobin was 13.7 about 4 months ago.  At that time her ferritin was 7. She will remain on vitamin D 50,000 unit tab twice daily, ferrous gluconate 324 mg daily, and 1000 mcg vitamin B12 injection every 2 weeks.   #4 reactive hypoglycemia.  Associated with #3 above. She is trying to eat frequent small meals. She is getting established with a new endocrinologist in November since her current 1 is retiring.  Nonfasting glucose and hemoglobin A1c today.   #4 Health maintenance exam: Reviewed age and gender appropriate health maintenance issues (prudent diet, regular exercise, health risks of tobacco and excessive alcohol, use of seatbelts, fire alarms in home, use of sunscreen).  Also reviewed age and gender appropriate health screening as well as vaccine recommendations. Vaccines: UTD Labs: lipid, cbc,bmet,vit b12, vit D. Cervical ca screening: per gyn Breast ca screening: per gyn. Colon ca screening: due for initial screening.  Options discussed--->cologuard chosen."  INTERIM HX: ***  She takes 50 K units vitamin D once a week. She takes vitamin B12 1000 mcg IM every 2 weeks.  PMP AWARE reviewed today: most recent rx for alprazolam was filled 07/23/2022, #120, rx by me. No red flags.  Past Medical History:   Diagnosis Date   Acute renal failure (HCC) 04/26/2021   prerenal azotemia   Allergic rhinitis    Altered sensorium due to hypoglycemia 03/2016   Anxiety and depression    started with PPD   Chronic neck pain    Myofascial pain syndrome per [place] [facility], oxycodone per their office pain med contract.  (Normal cervical MRI 2010 per ortho notes).   Easy bruisability 2011   Hx of: saw Hematologist (Dr. Cyndie Chime) and w/u neg for von Willebrand desease.   GERD (gastroesophageal reflux disease)    hx gastric ulcer and upper GI bleed, ? 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 Pt to start FeSO4 325 qd 10/28/19   Irregular menses 03/2018   GYN started [facility] 0.25-0.35.  [facility] as of 10/2018.  u/s wnl.  Endom bx NEG. OCPs helped.   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 Everardo All   Seizure [facility]) 2015   s/p eval with neuro, no medication, no events since   Syncope    with ? seizure in 2015, eval with neruo   Vitamin B12 deficiency    Vitamin D deficiency     Past Surgical  History:  Procedure Laterality Date   CESAREAN SECTION  2006   ELBOW X-RAY Left 12/07/2019   ENDOMETRIAL BIOPSY     EYE SURGERY Bilateral    2001   FINGER X-RAY Right 12/07/2019   ROUX-EN-Y GASTRIC BYPASS  01/05/2002   TONSILLECTOMY AND ADENOIDECTOMY     TUBAL LIGATION  2006   tubes in ears both Bilateral     Outpatient Medications Prior to Visit  Medication Sig Dispense Refill   ALPRAZolam (XANAX) 1 MG tablet Take 2 tablets (2 mg total) by mouth in the morning AND 2 tablets (2 mg total) at bedtime. 120 tablet 3    Calcium 250 MG CAPS Take 1 capsule by mouth 3 (three) times daily.     cyanocobalamin (VITAMIN B12) 1000 MCG/ML injection Inject 1 mL (1,000 mcg total) into the muscle every 2 weeks 10 mL 6   DULoxetine HCl 40 MG CPEP Take 1 capsule (40 mg) by mouth daily. 90 capsule 0   ferrous gluconate (FERGON) 324 MG tablet Take 1 tablet (324 mg total) by mouth daily with breakfast. 90 tablet 1   fluticasone (FLONASE) 50 MCG/ACT nasal spray Place 2 sprays into both nostrils daily. 16 g 6   gabapentin (NEURONTIN) 100 MG capsule Take 1 capsule (100 mg total) by mouth 4 (four) times daily. 120 capsule 3   glucose blood (FREESTYLE LITE) test strip USE TO CHECK BLOOD SUGAR 3 TIMES A DAY 200 strip 12   lamoTRIgine (LAMICTAL) 150 MG tablet Take 1 tablet (150 mg total) by mouth daily. 90 tablet 0   lisinopril (ZESTRIL) 20 MG tablet Take 1 tablet (20 mg total) by mouth daily. MUST KEEP APPOINTMENT FOR FURTHER REFILLS. 30 tablet 0   montelukast (SINGULAIR) 10 MG tablet Take 1 tablet (10 mg total) by mouth at bedtime. 30 tablet 5   norgestimate-ethinyl estradiol (ORTHO-CYCLEN) 0.25-35 MG-MCG tablet Take 1 tablet by mouth daily. 84 tablet 1   nystatin-triamcinolone ointment (MYCOLOG) Apply topically to affected area 2 (two) times daily as needed. 30 g 0   omeprazole (PRILOSEC) 40 MG capsule Take 1 capsule (40 mg total) by mouth 2 (two) times daily. 180 capsule 1   omeprazole (PRILOSEC) 40 MG capsule Take 1 capsule (40 mg total) by mouth 2 (two) times daily. 180 capsule 1   oxyCODONE-acetaminophen (PERCOCET) 10-325 MG tablet Take 1 tablet by mouth every 6 (six) hours as needed for pain.     rizatriptan (MAXALT-MLT) 10 MG disintegrating tablet 1 tab po qd as needed for migraine headache.  May repeat dose in 2 hours if needed.  Max dose in 24h is 20 mg. 10 tablet 5   Vitamin D, Ergocalciferol, (DRISDOL) 1.25 MG (50000 UNIT) CAPS capsule Take 1 capsule (50,000 Units total) by mouth 2 (two) times a week. 24 capsule 3   Ensure  Max Protein (ENSURE MAX PROTEIN) LIQD Take 11 oz by mouth daily as needed (low Glucose). Chocolate     No facility-administered medications prior to visit.    Allergies  Allergen Reactions   Ibuprofen     Causes ulcers    ROS As per HPI  PE:    07/10/2022    1:36 PM 03/19/2022    4:29 PM 03/12/2022    8:26 AM  Vitals with BMI  Height   5' 5.5"  Weight   261 lbs 3 oz  BMI   41.28  Systolic   786  Diastolic   81  Pulse   80     Information  is confidential and restricted. Go to Review Flowsheets to unlock data.   Physical Exam  ***  LABS:  Last CBC Lab Results  Component Value Date   WBC 10.1 03/12/2022   HGB 12.7 03/12/2022   HCT 39.2 03/12/2022   MCV 89.9 03/12/2022   MCH 29.5 11/09/2021   RDW 14.8 03/12/2022   PLT 342.0 03/12/2022   Lab Results  Component Value Date   IRON 153 11/09/2021   TIBC 532 (H) 11/09/2021   FERRITIN 7 (L) 11/09/2021   Last metabolic panel Lab Results  Component Value Date   GLUCOSE 96 03/12/2022   NA 137 03/12/2022   K 4.5 03/12/2022   CL 99 03/12/2022   CO2 26 03/12/2022   BUN 10 03/12/2022   CREATININE 0.76 03/12/2022   GFRNONAA >60 04/27/2021   CALCIUM 9.5 03/12/2022   PHOS 2.2 (L) 04/27/2021   PROT 7.3 11/09/2021   ALBUMIN 3.9 05/10/2021   BILITOT 0.5 11/09/2021   ALKPHOS 45 05/10/2021   AST 15 11/09/2021   ALT 9 11/09/2021   ANIONGAP 7 04/27/2021   Last lipids Lab Results  Component Value Date   CHOL 264 (H) 03/12/2022   HDL 92.00 03/12/2022   LDLCALC 89 02/06/2018   LDLDIRECT 122.0 03/12/2022   TRIG 226.0 (H) 03/12/2022   CHOLHDL 3 03/12/2022   Last hemoglobin A1c Lab Results  Component Value Date   HGBA1C 5.4 03/12/2022   Last thyroid functions Lab Results  Component Value Date   TSH 1.42 11/09/2021   Last vitamin D Lab Results  Component Value Date   VD25OH 31.33 03/12/2022   Last vitamin B12 and Folate Lab Results  Component Value Date   VITAMINB12 241 03/12/2022   FOLATE 17.0  03/22/2016   IMPRESSION AND PLAN:  No problem-specific Assessment & Plan notes found for this encounter.   An After Visit Summary was printed and given to the patient.  FOLLOW UP: No follow-ups on file.  Signed:  Santiago Bumpers, MD           08/02/2022

## 2022-08-05 ENCOUNTER — Encounter: Payer: Self-pay | Admitting: Family Medicine

## 2022-08-16 ENCOUNTER — Ambulatory Visit: Payer: Commercial Managed Care - PPO | Admitting: Family Medicine

## 2022-08-16 ENCOUNTER — Encounter: Payer: Self-pay | Admitting: Family Medicine

## 2022-08-16 VITALS — BP 151/100 | HR 76 | Temp 97.9°F | Ht 65.5 in | Wt 257.8 lb

## 2022-08-16 DIAGNOSIS — E559 Vitamin D deficiency, unspecified: Secondary | ICD-10-CM

## 2022-08-16 DIAGNOSIS — Z79899 Other long term (current) drug therapy: Secondary | ICD-10-CM

## 2022-08-16 DIAGNOSIS — E611 Iron deficiency: Secondary | ICD-10-CM | POA: Diagnosis not present

## 2022-08-16 DIAGNOSIS — K909 Intestinal malabsorption, unspecified: Secondary | ICD-10-CM | POA: Diagnosis not present

## 2022-08-16 DIAGNOSIS — I10 Essential (primary) hypertension: Secondary | ICD-10-CM | POA: Diagnosis not present

## 2022-08-16 DIAGNOSIS — E538 Deficiency of other specified B group vitamins: Secondary | ICD-10-CM

## 2022-08-16 DIAGNOSIS — F411 Generalized anxiety disorder: Secondary | ICD-10-CM

## 2022-08-16 MED ORDER — FLUTICASONE PROPIONATE 50 MCG/ACT NA SUSP: 2.0000 | Freq: Every day | NASAL | 1 refills | Status: DC

## 2022-08-16 MED ORDER — MONTELUKAST SODIUM 10 MG PO TABS: 10.0000 mg | ORAL_TABLET | Freq: Every day | ORAL | 1 refills | Status: DC

## 2022-08-16 MED ORDER — OMEPRAZOLE 40 MG PO CPDR: 40.0000 mg | DELAYED_RELEASE_CAPSULE | Freq: Two times a day (BID) | ORAL | 1 refills | Status: DC

## 2022-08-16 MED ORDER — ALPRAZOLAM 1 MG PO TABS: ORAL_TABLET | ORAL | 3 refills | Status: DC

## 2022-08-16 MED ORDER — LISINOPRIL 20 MG PO TABS: 20.0000 mg | ORAL_TABLET | Freq: Every day | ORAL | 1 refills | Status: DC

## 2022-08-16 MED ORDER — VITAMIN D (ERGOCALCIFEROL) 1.25 MG (50000 UNIT) PO CAPS: 50000.0000 [IU] | ORAL_CAPSULE | ORAL | 1 refills | Status: DC

## 2022-08-16 NOTE — Progress Notes (Signed)
OFFICE VISIT  08/16/2022  CC:  Chief Complaint  Patient presents with   Hypertension   Anxiety    Patient is a 46 y.o. 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 A/P as of last visit: "#1 hypertension, well controlled on lisinopril 20 mg a day. Electrolytes and creatinine today.  2.  Generalized anxiety disorder.  Doing well with alprazolam 2 mg twice daily. Additional psychiatric diagnoses treated and followed by her psychiatrist Dr. Lolly Mustache. Urine drug screen today.   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 She has some malabsorption associated with this, history of vitamin D, iron, and B12 deficiency. Checking vitamin D and B12 levels today. Her hemoglobin was 13.7 about 4 months ago.  At that time her ferritin was 7. She will remain on vitamin D 50,000 unit tab twice daily, ferrous gluconate 324 mg daily, and 1000 mcg vitamin B12 injection every 2 weeks.   #4 reactive hypoglycemia.  Associated with #3 above. She is trying to eat frequent small meals. She is getting established with a new endocrinologist in November since her current 1 is retiring.  Nonfasting glucose and hemoglobin A1c today.   #4 Health maintenance exam: Reviewed age and gender appropriate health maintenance issues (prudent diet, regular exercise, health risks of tobacco and excessive alcohol, use of seatbelts, fire alarms in home, use of sunscreen).  Also reviewed age and gender appropriate health screening as well as vaccine recommendations. Vaccines: UTD Labs: lipid, cbc,bmet,vit b12, vit D. Cervical ca screening: per gyn Breast ca screening: per gyn. Colon ca screening: due for initial screening.  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  This is better  for her.  Anxiety levels stable on alprazolam 2 mg twice daily.  States home blood pressures consistently 130/80 or better.  States that she is still having problems with hypoglycemia that follows not long after eating. She has initial endocrinologist appointment set for 08/20/2022.  PMP AWARE reviewed today: most recent rx for alprazolam was filled 07/23/22,  #120, rx by me. No red flags.  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  (Normal cervical MRI 2010 per ortho notes).   Easy bruisability 2011   Hx of: saw Hematologist (Dr. Cyndie Chime) and w/u neg for von Willebrand desease.   GERD (gastroesophageal reflux disease)    hx gastric ulcer and upper GI bleed, ? 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 Pt to start FeSO4 325 qd 10/28/19   Irregular menses 03/2018   GYN started [facility] 0.25-0.35.  [facility] as of 10/2018.  u/s wnl.  Endom bx NEG. OCPs helped.   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 Everardo All   Seiz[facility]) 2015   s/p eval with neuro, no medication, no events since   Syncope    with ? seizure in 2015, eval with neruo   Vitamin B12 deficiency    Vitamin D deficiency     Past Surgical History:  Procedure Laterality Date   CESAREAN SECTION  2006   ELBOW X-RAY Left 12/07/2019   ENDOMETRIAL  BIOPSY     EYE SURGERY Bilateral    2001   FINGER X-RAY Right 12/07/2019   ROUX-EN-Y GASTRIC BYPASS  01/05/2002   TONSILLECTOMY AND ADENOIDECTOMY     TUBAL LIGATION  2006   tubes in ears both Bilateral     Outpatient Medications Prior to Visit  Medication Sig Dispense Refill   Calcium 250 MG CAPS Take 1 capsule by mouth 3 (three) times daily.     cyanocobalamin (VITAMIN B12) 1000 MCG/ML injection Inject 1 mL (1,000 mcg total) into the muscle every 2 weeks 10 mL 6   DULoxetine HCl 40 MG CPEP Take 1 capsule (40 mg) by mouth daily. 90 capsule 0   ferrous gluconate (FERGON) 324 MG tablet Take 1 tablet (324 mg total) by mouth daily with breakfast. 90 tablet 1   fluconazole (DIFLUCAN) 150 MG tablet Take 1 tablet (150 mg total) by mouth now, then may repeat in 3 days if needed. 1 tablet 2   gabapentin (NEURONTIN) 100 MG capsule Take 1 capsule (100 mg total) by mouth 4 (four) times daily. 120 capsule 3   glucose blood (FREESTYLE LITE) test strip USE TO CHECK BLOOD SUGAR 3 TIMES A DAY 200 strip 12   lamoTRIgine (LAMICTAL) 150 MG tablet Take 1 tablet (150 mg total) by mouth daily. 90 tablet 0   nystatin-triamcinolone ointment (MYCOLOG) Apply topically to affected area 2 (two) times daily as needed. 30 g 0   oxyCODONE-acetaminophen (PERCOCET) 10-325 MG tablet Take 1 tablet by mouth every 6 (six) hours as needed for pain.     rizatriptan (MAXALT-MLT) 10 MG disintegrating tablet 1 tab po qd as needed for migraine headache.  May repeat dose in 2 hours if needed.  Max dose in 24h is 20 mg. 10 tablet 5   ALPRAZolam (XANAX) 1 MG tablet Take 2 tablets (2 mg total) by mouth in the morning AND 2 tablets (2 mg total) at bedtime. 120 tablet 3   fluticasone (FLONASE) 50 MCG/ACT nasal spray Place 2 sprays into both nostrils daily. 16 g 6   lisinopril (ZESTRIL) 20 MG tablet Take 1 tablet (20 mg total) by mouth daily. MUST KEEP APPOINTMENT FOR FURTHER REFILLS. 30 tablet 0   montelukast (SINGULAIR) 10 MG tablet Take  1 tablet (10 mg total) by mouth at bedtime. 30 tablet 5   norgestimate-ethinyl estradiol (FEMYNOR) 0.25-35 MG-MCG tablet Take 1 tablet by mouth daily. 28 tablet 6   norgestimate-ethinyl estradiol (ORTHO-CYCLEN) 0.25-35 MG-MCG tablet Take 1 tablet by mouth daily. 84 tablet 1   norgestimate-ethinyl estradiol (SPRINTEC 28) 0.25-35 MG-MCG tablet Take 1 tablet by mouth daily. 28 tablet 4   omeprazole (PRILOSEC) 40 MG capsule Take 1 capsule (40 mg total) by mouth 2 (two) times daily. 180 capsule 1   omeprazole (PRILOSEC) 40 MG capsule Take 1 capsule (40 mg total) by mouth 2 (two) times daily. 180 capsule 1   Vitamin D, Ergocalciferol, (DRISDOL) 1.25 MG (50000 UNIT) CAPS capsule Take 1 capsule (50,000 Units  total) by mouth 2 (two) times a week. 24 capsule 3   No facility-administered medications prior to visit.    Allergies  Allergen Reactions   Ibuprofen     Causes ulcers    ROS As per HPI  PE:    08/16/2022    2:13 PM 07/10/2022    1:36 PM 03/19/2022    4:29 PM  Vitals with BMI  Height 5' 5.5"    Weight 257 lbs 13 oz    BMI 42.23    Systolic 151    Diastolic 100    Pulse 76       Information is confidential and restricted. Go to Review Flowsheets to unlock data.  Rpt bp 140/90  Physical Exam  Gen: Alert, well appearing.  Patient is oriented to person, place, time, and situation. AFFECT: pleasant, lucid thought and speech. No further exam today.  LABS:  Last CBC Lab Results  Component Value Date   WBC 10.1 03/12/2022   HGB 12.7 03/12/2022   HCT 39.2 03/12/2022   MCV 89.9 03/12/2022   MCH 29.5 11/09/2021   RDW 14.8 03/12/2022   PLT 342.0 03/12/2022   Last metabolic panel Lab Results  Component Value Date   GLUCOSE 96 03/12/2022   NA 137 03/12/2022   K 4.5 03/12/2022   CL 99 03/12/2022   CO2 26 03/12/2022   BUN 10 03/12/2022   CREATININE 0.76 03/12/2022   GFRNONAA >60 04/27/2021   CALCIUM 9.5 03/12/2022   PHOS 2.2 (L) 04/27/2021   PROT 7.3 11/09/2021    ALBUMIN 3.9 05/10/2021   BILITOT 0.5 11/09/2021   ALKPHOS 45 05/10/2021   AST 15 11/09/2021   ALT 9 11/09/2021   ANIONGAP 7 04/27/2021   Last lipids Lab Results  Component Value Date   CHOL 264 (H) 03/12/2022   HDL 92.00 03/12/2022   LDLCALC 89 02/06/2018   LDLDIRECT 122.0 03/12/2022   TRIG 226.0 (H) 03/12/2022   CHOLHDL 3 03/12/2022   Last hemoglobin A1c Lab Results  Component Value Date   HGBA1C 5.4 03/12/2022   Last thyroid functions Lab Results  Component Value Date   TSH 1.42 11/09/2021   Last vitamin D Lab Results  Component Value Date   VD25OH 31.33 03/12/2022   Last vitamin B12 and Folate Lab Results  Component Value Date   VITAMINB12 241 03/12/2022   FOLATE 17.0 03/22/2016   Lab Results  Component Value Date   IRON 153 11/09/2021   TIBC 532 (H) 11/09/2021   FERRITIN 7 (L) 11/09/2021   IMPRESSION AND PLAN:  #1 hypertension, well controlled on lisinopril 20 mg a day. Electrolytes and creatinine today.  2.  GAD with insomnia. She has done well long-term on alprazolam 1 mg tabs, 2 twice daily. Controlled substance contract is up-to-date. Dr. Lolly Mustache manages the rest of her psychotropic medications.  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 She has some malabsorption associated with this, history of vitamin D, iron, and B12 deficiency. Rechecking levels today.  #4 eactive hypoglycemia.  Associated with #3 above. She is trying to eat frequent small meals. She is getting established with a new endocrinologist in November since her current one retired.  An After Visit Summary was printed and given to the patient.  FOLLOW UP: Return in about 6 months (around 02/14/2023) for annual CPE (fasting).  Signed:  Santiago Bumpers, MD           08/16/2022

## 2022-08-17 LAB — CBC
HCT: 39.4 % (ref 35.0–45.0)
Hemoglobin: 12.9 g/dL (ref 11.7–15.5)
MCH: 29.4 pg (ref 27.0–33.0)
MCHC: 32.7 g/dL (ref 32.0–36.0)
MCV: 89.7 fL (ref 80.0–100.0)
MPV: 11 fL (ref 7.5–12.5)
Platelets: 416 10*3/uL — ABNORMAL HIGH (ref 140–400)
RBC: 4.39 10*6/uL (ref 3.80–5.10)
RDW: 14.3 % (ref 11.0–15.0)
WBC: 8.7 10*3/uL (ref 3.8–10.8)

## 2022-08-17 LAB — VITAMIN B12: Vitamin B-12: 280 pg/mL (ref 200–1100)

## 2022-08-17 LAB — BASIC METABOLIC PANEL
BUN: 13 mg/dL (ref 7–25)
CO2: 24 mmol/L (ref 20–32)
Calcium: 9.6 mg/dL (ref 8.6–10.2)
Chloride: 104 mmol/L (ref 98–110)
Creat: 0.99 mg/dL (ref 0.50–0.99)
Glucose, Bld: 77 mg/dL (ref 65–99)
Potassium: 4.1 mmol/L (ref 3.5–5.3)
Sodium: 137 mmol/L (ref 135–146)

## 2022-08-17 LAB — IRON,TIBC AND FERRITIN PANEL
%SAT: 10 % (calc) — ABNORMAL LOW (ref 16–45)
Ferritin: 4 ng/mL — ABNORMAL LOW (ref 16–232)
Iron: 47 ug/dL (ref 40–190)
TIBC: 467 mcg/dL (calc) — ABNORMAL HIGH (ref 250–450)

## 2022-08-17 LAB — VITAMIN D 25 HYDROXY (VIT D DEFICIENCY, FRACTURES): Vit D, 25-Hydroxy: 41 ng/mL (ref 30–100)

## 2022-08-20 ENCOUNTER — Ambulatory Visit: Payer: Self-pay | Admitting: Internal Medicine

## 2022-08-20 ENCOUNTER — Other Ambulatory Visit: Payer: Self-pay | Admitting: Family Medicine

## 2022-08-20 ENCOUNTER — Other Ambulatory Visit (HOSPITAL_COMMUNITY): Payer: Self-pay

## 2022-08-20 NOTE — Progress Notes (Deleted)
Name: Toni [name]  MRN/ DOB: [id number], [birth year]    Age/ Sex: 46 y.o., female    PCP: Jeoffrey Massed, MD   Reason for Endocrinology Evaluation: Hypoglycemia      Date of Initial Endocrinology Evaluation: 08/20/2022     HPI: Toni [name] is a 46 y.o. female with a past medical history of ***. The patient presented for initial endocrinology clinic visit on 08/20/2022 for consultative assistance with her Hypoglycemia .   ***  HISTORY:  Past Medical History:  Past Medical History:  Diagnosis Date   Acute renal failure (HCC) 04/26/2021   prerenal azotemia   Allergic rhinitis    Altered sensorium due to hypoglycemia 03/2016   Anxiety and depression    started with PPD   Chronic neck pain    Myofascial pain syndrome per [place] [facility], oxycodone per their office pain med contract.  (Normal cervical MRI 2010 per ortho notes).   Easy bruisability 2011   Hx of: saw Hematologist (Dr. Cyndie Chime) and w/u neg for von Willebrand desease.   GERD (gastroesophageal reflux disease)    hx gastric ulcer and upper GI bleed, ? 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 Pt to start FeSO4 325 qd 10/28/19   Irregular menses 03/2018   GYN started Femynor 0.25-0.35.  [facility] as of 10/2018.  u/s wnl.  Endom bx NEG. OCPs helped.   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 Everardo All   Seizure [facility]) 2015   s/p eval with neuro, no medication, no events since   Syncope    with ? seizure in 2015, eval with neruo   Vitamin B12 deficiency    Vitamin D deficiency    Past Surgical History:  Past Surgical  History:  Procedure Laterality Date   CESAREAN SECTION  2006   ELBOW X-RAY Left 12/07/2019   ENDOMETRIAL BIOPSY     EYE SURGERY Bilateral    2001   FINGER X-RAY Right 12/07/2019   ROUX-EN-Y GASTRIC BYPASS  01/05/2002   TONSILLECTOMY AND ADENOIDECTOMY     TUBAL LIGATION  2006   tubes in ears both Bilateral     Social History:  reports that she has never smoked. She has never used smokeless tobacco. She reports current alcohol use. She reports that she does not use drugs. Family History: family history includes Aneurysm in her father; Arthritis in her mother; Asthma in her mother; Colon cancer in her maternal grandmother; Diabetes in her mother; Heart disease in her mother; Hyperlipidemia in her mother; Lung cancer in her maternal aunt and maternal aunt.   HOME MEDICATIONS: Allergies as of 08/20/2022       Reactions   Ibuprofen    Causes ulcers        Medication List        Accurate as of August 20, 2022  6:58 AM. If you have any questions, ask your nurse or doctor.          ALPRAZolam 1 MG tablet Commonly known as: XANAX Take 2 tablets (2  mg total) by mouth in the morning AND 2 tablets (2 mg total) at bedtime.   Calcium 250 MG Caps Take 1 capsule by mouth 3 (three) times daily.   cyanocobalamin 1000 MCG/ML injection Commonly known as: VITAMIN B12 Inject 1 mL (1,000 mcg total) into the muscle every 2 weeks   DULoxetine HCl 40 MG Cpep Take 1 capsule (40 mg) by mouth daily.   ferrous gluconate 324 MG tablet Commonly known as: FERGON Take 1 tablet (324 mg total) by mouth daily with breakfast.   fluconazole 150 MG tablet Commonly known as: DIFLUCAN Take 1 tablet (150 mg total) by mouth now, then may repeat in 3 days if needed.   fluticasone 50 MCG/ACT nasal spray Commonly known as: FLONASE Place 2 sprays into both nostrils daily.   FREESTYLE LITE test strip Generic drug: glucose blood USE TO CHECK BLOOD SUGAR 3 TIMES A DAY   gabapentin 100 MG  capsule Commonly known as: NEURONTIN Take 1 capsule (100 mg total) by mouth 4 (four) times daily.   lamoTRIgine 150 MG tablet Commonly known as: LAMICTAL Take 1 tablet (150 mg total) by mouth daily.   lisinopril 20 MG tablet Commonly known as: ZESTRIL Take 1 tablet (20 mg total) by mouth daily.   montelukast 10 MG tablet Commonly known as: SINGULAIR Take 1 tablet (10 mg total) by mouth at bedtime.   nystatin-triamcinolone ointment Commonly known as: MYCOLOG Apply topically to affected area 2 (two) times daily as needed.   omeprazole 40 MG capsule Commonly known as: PRILOSEC Take 1 capsule (40 mg total) by mouth 2 (two) times daily.   oxyCODONE-acetaminophen 10-325 MG tablet Commonly known as: PERCOCET Take 1 tablet by mouth every 6 (six) hours as needed for pain.   rizatriptan 10 MG disintegrating tablet Commonly known as: MAXALT-MLT 1 tab po qd as needed for migraine headache.  May repeat dose in 2 hours if needed.  Max dose in 24h is 20 mg.   Vitamin D (Ergocalciferol) 1.25 MG (50000 UNIT) Caps capsule Commonly known as: DRISDOL Take 1 capsule (50,000 Units total) by mouth 2 (two) times a week.          REVIEW OF SYSTEMS: A comprehensive ROS was conducted with the patient and is negative except as per HPI and below:  ROS     OBJECTIVE:  VS: There were no vitals taken for this visit.   Wt Readings from Last 3 Encounters:  08/16/22 257 lb 12.8 oz (116.9 kg)  03/12/22 261 lb 3.2 oz (118.5 kg)  12/07/21 267 lb 6.4 oz (121.3 kg)     EXAM: General: Pt appears well and is in NAD  Eyes: External eye exam normal without stare, lid lag or exophthalmos.  EOM intact.  PERRL.  Neck: General: Supple without adenopathy. Thyroid: Thyroid size normal.  No goiter or nodules appreciated. No thyroid bruit.  Lungs: Clear with good BS bilat with no rales, rhonchi, or wheezes  Heart: Auscultation: RRR.  Abdomen: Normoactive bowel sounds, soft, nontender, without masses or  organomegaly palpable  Extremities:  BL LE: No pretibial edema normal ROM and strength.  Mental Status: Judgment, insight: Intact Orientation: Oriented to time, place, and person Mood and affect: No depression, anxiety, or agitation     DATA REVIEWED: ***    ASSESSMENT/PLAN/RECOMMENDATIONS:   ***    Medications :  Signed electronically by: Lyndle Herrlich, MD  [facility] Endocrinology  [facility] [address] [place], [place] [address] Phone: [phone]  FAX: [phone]   CC: Jeoffrey Massed, MD [address] [place] [address] Phone: [phone] Fax: [phone]   Return to Endocrinology clinic as below: Future Appointments  Date Time Provider Department Center  08/20/2022  8:10 AM Amerie Beaumont, Konrad Dolores, MD [facility] None  10/10/2022 11:30 AM Arfeen, Phillips Grout, MD [facility] None  02/14/2023  1:00 PM McGowen, Maryjean Morn, MD [facility]

## 2022-08-21 ENCOUNTER — Other Ambulatory Visit (HOSPITAL_COMMUNITY): Payer: Self-pay

## 2022-08-22 ENCOUNTER — Other Ambulatory Visit (HOSPITAL_COMMUNITY): Payer: Self-pay

## 2022-08-23 ENCOUNTER — Other Ambulatory Visit (HOSPITAL_COMMUNITY): Payer: Self-pay

## 2022-08-26 ENCOUNTER — Other Ambulatory Visit (HOSPITAL_COMMUNITY): Payer: Self-pay

## 2022-09-02 ENCOUNTER — Other Ambulatory Visit (HOSPITAL_COMMUNITY): Payer: Self-pay

## 2022-09-11 ENCOUNTER — Other Ambulatory Visit (HOSPITAL_COMMUNITY): Payer: Self-pay

## 2022-09-13 ENCOUNTER — Other Ambulatory Visit: Payer: Self-pay

## 2022-09-16 ENCOUNTER — Encounter: Payer: Self-pay | Admitting: Family Medicine

## 2022-09-16 MED ORDER — ALPRAZOLAM 1 MG PO TABS: ORAL_TABLET | ORAL | 4 refills | Status: DC

## 2022-09-16 NOTE — Telephone Encounter (Signed)
Requesting: alprazolam Contract: 03/12/22 UDS: 03/12/22 Last Visit: 08/16/22 Next Visit: 02/14/23 Last Refill: 08/16/22(120,3) [facility]  Please Advise. Med pending

## 2022-09-17 ENCOUNTER — Encounter: Payer: Self-pay | Admitting: Family Medicine

## 2022-09-18 ENCOUNTER — Encounter: Payer: Self-pay | Admitting: Family Medicine

## 2022-09-19 ENCOUNTER — Telehealth: Payer: Self-pay | Admitting: Family Medicine

## 2022-09-19 ENCOUNTER — Telehealth (HOSPITAL_COMMUNITY): Payer: Self-pay | Admitting: Psychiatry

## 2022-09-19 MED ORDER — ALPRAZOLAM 1 MG PO TABS: ORAL_TABLET | ORAL | 4 refills | Status: DC

## 2022-09-19 NOTE — Telephone Encounter (Signed)
Pt is calling about why her medication is pending and ask for a call back regarding this issue. Please call patient on the number in her chart.

## 2022-09-19 NOTE — Telephone Encounter (Signed)
Contacted [facility] to confirm if prescription was shipped and received, spoke with Kathie Rhodes. Medication was shipped on 08/19/22 for 30 day supply. She picked it up from the post office on 11/17. [facility] on [address] confirmed patient picked up prescription on 11/16 for #120 equalling 30 day supply.   Please advise

## 2022-09-19 NOTE — Telephone Encounter (Signed)
Patient states she was speaking with [facility] but lost service. Please call patient back when available

## 2022-09-19 NOTE — Telephone Encounter (Signed)
Contacted the pharmacy and they are not able to fill it because on datebase for controlled substances, it appears she received it twice in November. One thru mail order and the other from [facility]. Advised pharmacist I would confirm with [facility] and call back if approval given for refill.

## 2022-09-19 NOTE — Telephone Encounter (Signed)
Patient states Toni [name] needs authorization from our office to fill prescription. They believe she received it twice last month due to [facility] prescription sent. She states she never received this prescription from them.

## 2022-09-19 NOTE — Telephone Encounter (Signed)
Noted  

## 2022-09-19 NOTE — Telephone Encounter (Signed)
Please resend to [facility]. Med pending

## 2022-09-19 NOTE — Telephone Encounter (Signed)
Patient advised medication resent to [facility]. She would like to know if she can do early refill for today?

## 2022-09-19 NOTE — Telephone Encounter (Signed)
Contacted patient. Explained to her the documentation we have on the on-line controlled substance prescription database as well as the information obtained through [facility] and [facility].  This information indicates that she filled a 30-day supply of alprazolam twice last month.  I told her that this was a violation of our controlled substance agreement and I would no longer be able to prescribe her any controlled substances. She maintained that she never picked up anything from [facility] and said she would try to call them back and get more information. Signed:  Santiago Bumpers, MD           09/19/2022

## 2022-09-19 NOTE — Telephone Encounter (Signed)
Patient calling back, stated she is having another issues with prescription.

## 2022-09-19 NOTE — Telephone Encounter (Signed)
Patient called at 1446 requesting to speak with Dr. Lolly Mustache stating she had a prescription for xanax ordered by PCP already entered at a pharmacy but the pharmacy would not fill it because their records reflect she had 2 prescriptions for a 30-day supply already filled and picked up in November - one from mail order and one from a local pharmacy. Note from PCP entered today reflects the same information and indicates PCP has informed patient he would no longer prescribe controlled substances. Patient states she did not receive the mail order supply. Patient states the pharmacy where prescription is on hold told her that she needed to get Dr. Lolly Mustache to "sign off" on the prescription in order for them to dispense. States she called the [place] office because no one was answering at the [place] office. Explained to patient that the [facility] office is closed this afternoon after 2 pm and she would have to call back tomorrow. Also gave her contact information for the [facility] if she needs to see a provider urgently.

## 2022-09-20 NOTE — Telephone Encounter (Signed)
Her prescription given yesterday by PCP.

## 2022-09-22 ENCOUNTER — Ambulatory Visit (HOSPITAL_COMMUNITY)
Admission: EM | Admit: 2022-09-22 | Discharge: 2022-09-22 | Disposition: A | Discharge status: Home / Self Care | Payer: Commercial Managed Care - PPO | Attending: Emergency Medicine | Admitting: Emergency Medicine

## 2022-09-22 ENCOUNTER — Encounter (HOSPITAL_COMMUNITY): Payer: Self-pay | Admitting: *Deleted

## 2022-09-22 DIAGNOSIS — R21 Rash and other nonspecific skin eruption: Secondary | ICD-10-CM

## 2022-09-22 DIAGNOSIS — G43809 Other migraine, not intractable, without status migrainosus: Secondary | ICD-10-CM | POA: Diagnosis not present

## 2022-09-22 MED ORDER — METOCLOPRAMIDE HCL 5 MG/ML IJ SOLN
5.0000 mg | Freq: Once | INTRAMUSCULAR | Status: AC
  Administered 2022-09-22: 5 mg via INTRAMUSCULAR

## 2022-09-22 MED ORDER — DEXAMETHASONE SODIUM PHOSPHATE 10 MG/ML IJ SOLN
10.0000 mg | Freq: Once | INTRAMUSCULAR | Status: AC
  Administered 2022-09-22: 10 mg via INTRAMUSCULAR

## 2022-09-22 MED ORDER — HYDROXYZINE HCL 25 MG PO TABS: 25.0000 mg | ORAL_TABLET | Freq: Four times a day (QID) | ORAL | 0 refills | Status: DC

## 2022-09-22 MED ORDER — RIZATRIPTAN BENZOATE 10 MG PO TBDP: ORAL_TABLET | ORAL | 5 refills | Status: DC

## 2022-09-22 MED ORDER — DEXAMETHASONE SODIUM PHOSPHATE 10 MG/ML IJ SOLN
INTRAMUSCULAR | Status: AC
  Filled 2022-09-22: qty 1

## 2022-09-22 MED ORDER — PREDNISONE 10 MG PO TABS: ORAL_TABLET | ORAL | 0 refills | Status: DC

## 2022-09-22 MED ORDER — SUMATRIPTAN SUCCINATE 6 MG/0.5ML ~~LOC~~ SOLN
6.0000 mg | Freq: Once | SUBCUTANEOUS | Status: AC
  Administered 2022-09-22: 6 mg via SUBCUTANEOUS

## 2022-09-22 MED ORDER — HYDROCORTISONE 1 % EX CREA: TOPICAL_CREAM | CUTANEOUS | 0 refills | Status: DC

## 2022-09-22 MED ORDER — SUMATRIPTAN SUCCINATE 6 MG/0.5ML ~~LOC~~ SOLN
SUBCUTANEOUS | Status: AC
  Filled 2022-09-22: qty 0.5

## 2022-09-22 MED ORDER — METOCLOPRAMIDE HCL 5 MG/ML IJ SOLN
INTRAMUSCULAR | Status: AC
  Filled 2022-09-22: qty 2

## 2022-09-22 NOTE — ED Provider Notes (Signed)
[facility]    CSN: [id number] Arrival date & time: 09/22/22  1010      History   Chief Complaint Chief Complaint  Patient presents with   Rash   Headache    HPI Toni [name] is a 46 y.o. female.  Patient presents complaining of migraine and rash that started 1 day ago.  Patient stated onset of symptoms began after she was helping her friend clean, then she noticed these red areas on her right arm.  Patient states that she is unsure if an insect started biting her.  Patient reports itching to the site of rash.  She has taken Benadryl with minimal relief of symptoms.  She reports a history of seasonal allergies, she denies any other known allergies.  She states that she avoids taking ibuprofen due to her history of stomach ulcer.  She states that her migraine occurred around the same time.  She is reporting photophobia and nausea.  She states that she has pain located in the frontal area of her head.  She denies any lightheadedness, visual disturbance, or dizziness.  She denies any trauma or fall.  She reports that she is ran out of her Maxalt medication that she normally uses for her migraines.    Rash Associated symptoms: headaches   Associated symptoms: no fatigue, no fever, no shortness of breath, no sore throat and not wheezing   Headache Associated symptoms: photophobia   Associated symptoms: no cough, no dizziness, no fatigue, no fever, no numbness, no sore throat and no weakness     Past Medical History:  Diagnosis Date   Acute renal failure (HCC) 04/26/2021   prerenal azotemia   Allergic rhinitis    Altered sensorium due to hypoglycemia 03/2016   Anxiety and depression    started with PPD   Chronic neck pain    Myofascial pain syndrome per [place] [facility], oxycodone per their office pain med contract.  (Normal cervical MRI 2010 per ortho notes).   Easy bruisability 2011   Hx of: saw Hematologist (Dr. Cyndie Chime) and w/u neg for von Willebrand desease.   GERD  (gastroesophageal reflux disease)    hx gastric ulcer and upper GI bleed, ? 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 Pt to start FeSO4 325 qd 10/28/19   Irregular menses 03/2018   GYN started Femynor 0.25-0.35.  [facility] as of 10/2018.  u/s wnl.  Endom bx NEG. OCPs helped.   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) joint of right thumb 12/2019   [facility]   Reactive hypoglycemia    Dr. Everardo All   Seizure [facility]) 2015   s/p eval with neuro, no medication, no events since   Syncope    with ? 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   Altered mental status 04/11/2016   Hypoglycemia 03/21/2016   Encephalopathy acute 03/21/2016   Vaginal discharge 02/13/2016   Visit for preventive health examination 11/24/2015   Vitamin B12 deficiency 06/05/2015   Vitamin D deficiency 04/30/2015   Chronic neck pain 04/11/2015   Esophageal reflux 04/11/2015   Class 3 obesity with BMI of 43.80 04/11/2015   Anxiety and depression 04/11/2015   Migraine    History of bariatric surgery 12/19/2001    Past Surgical History:   Procedure Laterality Date   CESAREAN SECTION  2006   ELBOW X-RAY Left 12/07/2019   ENDOMETRIAL BIOPSY     EYE SURGERY Bilateral    2001   FINGER X-RAY Right 12/07/2019   ROUX-EN-Y GASTRIC BYPASS  01/05/2002   TONSILLECTOMY AND ADENOIDECTOMY     TUBAL LIGATION  2006   tubes in ears both Bilateral     OB History   No obstetric history on file.      Home Medications    Prior to Admission medications   Medication Sig Start Date End Date Taking? Authorizing Provider  ALPRAZolam Prudy Feeler) 1 MG tablet Take 2 tablets (2 mg total) by mouth in the morning AND 2 tablets (2 mg total) at bedtime. 09/19/22  Yes McGowen, Maryjean Morn, MD  Calcium 250 MG CAPS Take 1 capsule by mouth 3 (three) times daily.   Yes [provider]  cyanocobalamin (VITAMIN B12) 1000 MCG/ML injection Inject 1 mL (1,000 mcg total) into the muscle every 2 weeks 02/18/22  Yes McGowen, Maryjean Morn, MD  DULoxetine HCl 40 MG CPEP Take 1 capsule (40 mg) by mouth daily. 07/10/22  Yes Arfeen, Phillips Grout, MD  ferrous gluconate (FERGON) 324 MG tablet Take 1 tablet (324 mg total) by mouth daily with breakfast. 03/18/22  Yes McGowen, Maryjean Morn, MD  gabapentin (NEURONTIN) 100 MG capsule Take 1 capsule (100 mg total) by mouth 4 (four) times daily. 03/20/22  Yes Sheran Luz, MD  glucose blood (FREESTYLE LITE) test strip USE TO CHECK BLOOD SUGAR 3 TIMES A DAY 06/06/20  Yes McGowen, Maryjean Morn, MD  hydrocortisone cream 1 % Apply to affected area 2 times daily 09/22/22  Yes Abraham Margulies, Hall Busing, NP  hydrOXYzine (ATARAX) 25 MG tablet Take 1 tablet (25 mg total) by mouth every 6 (six) hours. 09/22/22  Yes Debby Freiberg, NP  lamoTRIgine (LAMICTAL) 150 MG tablet Take 1 tablet (150 mg total) by mouth daily. 07/10/22  Yes Arfeen, Phillips Grout, MD  lisinopril (ZESTRIL) 20 MG tablet Take 1 tablet (20 mg total) by mouth daily. 08/16/22  Yes McGowen, Maryjean Morn, MD  montelukast (SINGULAIR) 10 MG tablet Take 1 tablet (10 mg total) by mouth at bedtime. 08/16/22   Yes McGowen, Maryjean Morn, MD  nystatin-triamcinolone ointment [facility]) Apply topically to affected area 2 (two) times daily as needed. 04/23/21  Yes   omeprazole (PRILOSEC) 40 MG capsule Take 1 capsule (40 mg total) by mouth 2 (two) times daily. 08/16/22  Yes McGowen, Maryjean Morn, MD  oxyCODONE-acetaminophen (PERCOCET) 10-325 MG tablet Take 1 tablet by mouth every 6 (six) hours as needed for pain. 05/31/19  Yes [provider]  predniSONE (DELTASONE) 10 MG tablet Take 6 tablets on the first day, 5 tablets on the second day, 4 tablets on the third  day, 3 tablets on the fourth day, 2 tablets on the fifth day, and 1 tablet on the last day. 09/22/22  Yes Debby Freiberg, NP  Vitamin D, Ergocalciferol, (DRISDOL) 1.25 MG (50000 UNIT) CAPS capsule Take 1 capsule (50,000 Units total) by mouth 2 (two) times a week. 08/19/22  Yes McGowen, Maryjean Morn, MD  fluconazole (DIFLUCAN) 150 MG tablet Take 1 tablet (150 mg total) by mouth now, then may repeat in 3 days if needed. 08/02/22     fluticasone (FLONASE) 50 MCG/ACT nasal spray Place 2 sprays into both nostrils daily. 08/16/22   McGowen, Maryjean Morn, MD  rizatriptan (MAXALT-MLT) 10 MG disintegrating tablet 1 tab po qd as needed for migraine headache.  May repeat dose in 2 hours if needed.  Max dose in 24h is 20 mg. 09/22/22   Debby Freiberg, NP    Family History Family History  Problem Relation Age of Onset   Arthritis Mother    Hyperlipidemia Mother    Heart disease Mother    Diabetes Mother    Asthma Mother    Aneurysm Father        deceased   Lung cancer Maternal Aunt        smoker   Colon cancer Maternal Grandmother    Lung cancer Maternal Aunt        smoker    Social History Social History   Tobacco Use   Smoking status: Never   Smokeless tobacco: Never  Vaping Use   Vaping Use: Never used  Substance Use Topics   Alcohol use: Yes    Alcohol/week: 0.0 standard drinks of alcohol    Comment: occasional    Drug use: No      Allergies   Ibuprofen   Review of Systems Review of Systems  Constitutional:  Negative for activity change, appetite change, chills, fatigue and fever.  HENT:  Negative for sore throat and trouble swallowing.   Eyes:  Positive for photophobia. Negative for visual disturbance.  Respiratory:  Negative for cough, chest tightness, shortness of breath, wheezing and stridor.   Cardiovascular:  Negative for chest pain and palpitations.  Gastrointestinal: Negative.   Skin:  Positive for rash (Reports on RT arm).  Allergic/Immunologic: Positive for environmental allergies.  Neurological:  Positive for headaches. Negative for dizziness, tremors, syncope, facial asymmetry, speech difficulty, weakness, light-headedness and numbness.     Physical Exam Triage Vital Signs ED Triage Vitals  Enc Vitals Group     BP 09/22/22 1109 112/77     Pulse Rate 09/22/22 1109 72     Resp 09/22/22 1109 18     Temp 09/22/22 1109 97.8 F (36.6 C)     Temp Source 09/22/22 1109 Oral     SpO2 09/22/22 1109 96 %     Weight --      Height --      Head Circumference --      Peak Flow --      Pain Score 09/22/22 1106 9     Pain Loc --      Pain Edu? --      Excl. in GC? --    No data found.  Updated Vital Signs BP 112/77 (BP Location: Right Arm)   Pulse 72   Temp 97.8 F (36.6 C) (Oral)   Resp 18   LMP 09/03/2022 (Approximate)   SpO2 96%     Physical Exam Vitals and nursing note reviewed.  Constitutional:      Appearance: She is  well-developed.  Eyes:     Extraocular Movements:     Right eye: Normal extraocular motion and no nystagmus.     Left eye: Normal extraocular motion and no nystagmus.  Cardiovascular:     Rate and Rhythm: Normal rate and regular rhythm.     Heart sounds: Normal heart sounds, S1 normal and S2 normal.  Pulmonary:     Effort: Pulmonary effort is normal.     Breath sounds: Normal breath sounds and air entry. No decreased breath sounds, wheezing, rhonchi or rales.   Skin:    General: Skin is warm.     Findings: Erythema and rash present.          Comments: Multiple raised erythemic area present on RT arm, staring at palm and traveling to upper arm. A single raised erythemic area is present on right side of neck.   Neurological:     General: No focal deficit present.     Mental Status: She is alert.      UC Treatments / Results  Labs (all labs ordered are listed, but only abnormal results are displayed) Labs Reviewed - No data to display  EKG   Radiology No results found.  Procedures Procedures (including critical care time)  Medications Ordered in UC Medications  metoCLOPramide (REGLAN) injection 5 mg (5 mg Intramuscular Given 09/22/22 1209)  dexamethasone (DECADRON) injection 10 mg (10 mg Intramuscular Given 09/22/22 1209)  SUMAtriptan (IMITREX) injection 6 mg (6 mg Subcutaneous Given 09/22/22 1210)    Initial Impression / Assessment and Plan / UC Course  I have reviewed the triage vital signs and the nursing notes.  Pertinent labs & imaging results that were available during my care of the patient were reviewed by me and considered in my medical decision making (see chart for details).     Patient was evaluated for migraine and rash.  Migraine cocktail given in office.Imitrex refilled and sent to pharmacy.  Migraine cocktail included a IM steroid to help with rash.  Patient experienced some relief of symptoms while in office after medication administration.  Prescription for prednisone, hydrocortisone cream, and hydroxyzine was sent to the pharmacy.  Patient was made aware of treatment regiment and possible side effects.  Patient was made aware of timeline for resolution of symptoms and when follow-up would be necessary.  Patient verbalized understanding of instructions.  Charting was provided using a a verbal dictation system, charting was proofread for errors, errors may occur which could change the meaning of the information  charted.   Final Clinical Impressions(s) / UC Diagnoses   Final diagnoses:  Rash  Other migraine without status migrainosus, not intractable     Discharge Instructions      A prednisone taper has been sent to the pharmacy, you may start taking this medication tomorrow . A refill of the Maxalt medication has been sent to the pharmacy.   Hydrocortisone cream has been sent to the pharmacy, you can place this on the affected areas 2 times daily.   Hydroxyzine has been sent to the pharmacy to help with itching, this is an antihistamine, you can take this every 6 hours as needed.  Please follow up if symptoms are not improving.       ED Prescriptions     Medication Sig Dispense Auth. Provider   rizatriptan (MAXALT-MLT) 10 MG disintegrating tablet 1 tab po qd as needed for migraine headache.  May repeat dose in 2 hours if needed.  Max dose in 24h is 20  mg. 20 tablet Debby Freiberg, NP   predniSONE (DELTASONE) 10 MG tablet Take 6 tablets on the first day, 5 tablets on the second day, 4 tablets on the third day, 3 tablets on the fourth day, 2 tablets on the fifth day, and 1 tablet on the last day. 21 tablet Debby Freiberg, NP   hydrocortisone cream 1 % Apply to affected area 2 times daily 15 g Debby Freiberg, NP   hydrOXYzine (ATARAX) 25 MG tablet Take 1 tablet (25 mg total) by mouth every 6 (six) hours. 12 tablet Debby Freiberg, NP      PDMP not reviewed this encounter.   Debby Freiberg, NP 09/22/22 1510

## 2022-09-22 NOTE — Discharge Instructions (Addendum)
A prednisone taper has been sent to the pharmacy, you may start taking this medication tomorrow . A refill of the Maxalt medication has been sent to the pharmacy.   Hydrocortisone cream has been sent to the pharmacy, you can place this on the affected areas 2 times daily.   Hydroxyzine has been sent to the pharmacy to help with itching, this is an antihistamine, you can take this every 6 hours as needed.  Please follow up if symptoms are not improving.

## 2022-09-22 NOTE — ED Triage Notes (Signed)
Pt states she has a migraine x 1 day and is out of home meds. She also has been helping a friend clean and now has bites all over her they itch and pain she took benadryl by mouth.

## 2022-09-26 ENCOUNTER — Other Ambulatory Visit (HOSPITAL_COMMUNITY): Payer: Self-pay

## 2022-09-27 ENCOUNTER — Other Ambulatory Visit (HOSPITAL_COMMUNITY): Payer: Self-pay

## 2022-10-08 ENCOUNTER — Other Ambulatory Visit (HOSPITAL_COMMUNITY): Payer: Self-pay

## 2022-10-09 ENCOUNTER — Telehealth (HOSPITAL_COMMUNITY): Payer: Self-pay | Admitting: Psychiatry

## 2022-10-10 ENCOUNTER — Telehealth (HOSPITAL_COMMUNITY): Payer: Commercial Managed Care - PPO | Admitting: Psychiatry

## 2022-10-10 ENCOUNTER — Telehealth (HOSPITAL_COMMUNITY): Payer: Self-pay

## 2022-10-10 DIAGNOSIS — F419 Anxiety disorder, unspecified: Secondary | ICD-10-CM

## 2022-10-10 DIAGNOSIS — F331 Major depressive disorder, recurrent, moderate: Secondary | ICD-10-CM

## 2022-10-10 NOTE — Telephone Encounter (Signed)
Patient called to verify pharmacy and request a refill for the following medication Last visit 07/10/22 Next visit 10/10/22    Disp Refills Start End   lamoTRIgine (LAMICTAL) 150 MG tablet 90 tablet 0 07/10/2022    Sig - Route: Take 1 tablet (150 mg total) by mouth daily. - Oral   Sent to pharmacy as: lamoTRIgine (LAMICTAL) 150 MG tablet       Disp Refills Start End   DULoxetine HCl 40 MG CPEP 90 capsule 0 07/10/2022    Sig - Route: Take 1 capsule (40 mg) by mouth daily. - Oral   Sent to pharmacy as: DULoxetine HCl 40 MG Cap DR Particles     Per patient request please send to the following pharmacy  [place], [place] Phone: [phone]  Fax: [phone]

## 2022-10-11 MED ORDER — DULOXETINE HCL 40 MG PO CPEP: 40.0000 mg | ORAL_CAPSULE | Freq: Every day | ORAL | 0 refills | Status: DC

## 2022-10-11 MED ORDER — LAMOTRIGINE 150 MG PO TABS: 150.0000 mg | ORAL_TABLET | Freq: Every day | ORAL | 0 refills | Status: DC

## 2022-10-11 NOTE — Telephone Encounter (Signed)
A 30-day supply given until patient sees a provider for future refills.  Please inform the patient.

## 2022-10-11 NOTE — Addendum Note (Signed)
Addended by: Berniece Andreas T on: 10/11/2022 09:29 AM   Modules accepted: Orders

## 2022-10-21 ENCOUNTER — Telehealth (HOSPITAL_COMMUNITY): Payer: Commercial Managed Care - PPO | Admitting: Psychiatry

## 2022-10-23 ENCOUNTER — Telehealth (HOSPITAL_BASED_OUTPATIENT_CLINIC_OR_DEPARTMENT_OTHER): Payer: Commercial Managed Care - PPO | Admitting: Psychiatry

## 2022-10-23 ENCOUNTER — Encounter (HOSPITAL_COMMUNITY): Payer: Self-pay | Admitting: Psychiatry

## 2022-10-23 VITALS — Wt 257.0 lb

## 2022-10-23 DIAGNOSIS — F419 Anxiety disorder, unspecified: Secondary | ICD-10-CM | POA: Diagnosis not present

## 2022-10-23 DIAGNOSIS — F331 Major depressive disorder, recurrent, moderate: Secondary | ICD-10-CM

## 2022-10-23 DIAGNOSIS — F429 Obsessive-compulsive disorder, unspecified: Secondary | ICD-10-CM | POA: Diagnosis not present

## 2022-10-23 MED ORDER — LAMOTRIGINE 150 MG PO TABS: 150.0000 mg | ORAL_TABLET | Freq: Every day | ORAL | 0 refills | Status: DC

## 2022-10-23 MED ORDER — GABAPENTIN 300 MG PO CAPS: 300.0000 mg | ORAL_CAPSULE | Freq: Two times a day (BID) | ORAL | 0 refills | Status: DC

## 2022-10-23 MED ORDER — DULOXETINE HCL 40 MG PO CPEP: 40.0000 mg | ORAL_CAPSULE | Freq: Every day | ORAL | 0 refills | Status: DC

## 2022-10-23 NOTE — Progress Notes (Signed)
Virtual Visit via Telephone Note  I connected with Cecil Cranker on 10/23/22 at 10:00 AM EST by telephone and verified that I am speaking with the correct person using two identifiers.  Location: Patient: Home Provider: Home Office   I discussed the limitations, risks, security and privacy concerns of performing an evaluation and management service by telephone and the availability of in person appointments. I also discussed with the patient that there may be a patient responsible charge related to this service. The patient expressed understanding and agreed to proceed.   History of Present Illness: Patient is evaluated by phone session.  She reported laid off from her job.  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  She applying for other places.  She had interview had tried adult/pediatric practice but also she has offered from [facility].  She has to move back to her mother's house because her aunt's boyfriend came back and things were not working for her.  She struggled with finances.  Patient has a very quiet Christmas.  She is disappointed because children did not come to see her.  Her 29 year old son visited [place] with his friends.  Her 78 year old son lives separately.  She sees her 32 year old every few days but he stays with his dad.  Patient reported not taking the Xanax for past few weeks because his PCP refused to renew after she was given multiple medication of Xanax.  Patient told her [facility] supposed [facility] but she never received and she went to local pharmacy to fill the prescription and that raises flag.  Patient told that she had a claim against [facility] and hoping to have situation resolved.  However she endorsed a lot of anxiety, nervousness and racing thoughts.  She recently visited the emergency room for rash and given steroid injection which helps.  She still  struggle with obsessive thoughts, chronic depression and anxiety.  She has pain in her hip and taking gabapentin along with narcotic pain medication.  Her appetite is okay.  She is afraid that she may lose her insurance and of this month and she is in desperate need to find a better job.  At this time she cannot afford therapy.  She wants [place] to write the Xanax.  She was taking Xanax 1 mg 4 times a day and not taking for past 2 weeks.  She did not exhibit any withdrawal symptoms.  She denies drinking or using any illegal substances.  She reported her last drink was more than a year ago and she feels proud that she stopped on her own.  She also applied for disability but does not want to sit and wait for the decision and like to go back to work force if she can handle it.  She denies any crying spells or any feeling of hopelessness.  She denies any paranoia, hallucination or any suicidal thoughts.  Past Psychiatric History: H/O depression after got married.  Tried Zoloft for years but stopped after seizure. PCP tried trazodone, Ambien, Doxepin and Seroquel but  did not work.  We tried higher dose of Cymbalta but developed side effects.  H/O irritability, poor impulse control with excessive buying and shopping but no history of mania, psychosis, suicidal attempt or inpatient treatment.   Psychiatric Specialty Exam: Physical Exam  Review of Systems  Musculoskeletal:        Hip  pain    Weight 257 lb (116.6 kg).There is no height or weight on file to calculate BMI.  General Appearance: NA  Eye Contact:  NA  Speech:  Slow  Volume:  Decreased  Mood:  Anxious and Dysphoric  Affect:  NA  Thought Process:  Goal Directed  Orientation:  Full (Time, Place, and Person)  Thought Content:  Obsessions and Rumination  Suicidal Thoughts:  No  Homicidal Thoughts:  No  Memory:  Immediate;   Good Recent;   Good Remote;   Good  Judgement:  Fair  Insight:  Shallow  Psychomotor Activity:  NA  Concentration:   Concentration: Fair and Attention Span: Fair  Recall:  Good  Fund of Knowledge:  Good  Language:  Good  Akathisia:  No  Handed:  Right  AIMS (if indicated):     Assets:  Communication Skills Desire for Improvement Housing Talents/Skills Transportation  ADL's:  Intact  Cognition:  WNL  Sleep:   good      Assessment and Plan: Major depressive disorder, recurrent.  Anxiety.  Obsessive-compulsive thoughts.  Review notes from recent providers.  Discuss residual anxiety, depression and obsessive thoughts.  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  She also like to get Xanax from our office.  I explained that she is taking multiple medication and since she is out of the Xanax for 2 weeks and not exhibited any withdrawal symptoms then should consider optimizing the gabapentin that can also help with anxiety and pain.  Patient agreed to give a try.  She does not want to increase Cymbalta and Lamictal.  She has no itching or concern from the medication.  Currently she is taking gabapentin 100 mg 4 times a day prescribed by Dr. Dossie Der.  I suggested she can try 300 mg 2 times a day and she agreed with the plan.  She is not taking Xanax.  I also offered therapy but at this time patient is not able to afford.  Discussed medication side effects and benefits specially gabapentin higher dose can make her sleepy.  Recommend to call us back if she has any concerns.  Follow-up in 3 months.  Patient to get a 90-day supply from [facility].  Follow Up Instructions:    I discussed the assessment and treatment plan with the patient. The patient was provided an opportunity to ask questions and all were answered. The patient agreed with the plan and demonstrated an understanding of the instructions.   The patient was advised to call back or seek an in-person evaluation if the symptoms worsen or if the condition fails to improve as anticipated.  Collaboration of Care: Other provider involved in patient's  care AEB notes are available in epic to review.  Patient/Guardian was advised Release of Information must be obtained prior to any record release in order to collaborate their care with an outside provider. Patient/Guardian was advised if they have not already done so to contact the registration department to sign all necessary forms in order for [place] to release information regarding their care.   Consent: Patient/Guardian gives verbal consent for treatment and assignment of benefits for services provided during this visit. Patient/Guardian expressed understanding and agreed to proceed.    I provided 30 minutes of non-face-to-face time during this encounter.   Kathlee Nations, MD

## 2022-10-31 ENCOUNTER — Other Ambulatory Visit (HOSPITAL_COMMUNITY): Payer: Self-pay

## 2022-11-06 ENCOUNTER — Other Ambulatory Visit: Payer: Self-pay

## 2022-11-06 ENCOUNTER — Other Ambulatory Visit (HOSPITAL_COMMUNITY): Payer: Self-pay

## 2022-11-06 MED ORDER — CYANOCOBALAMIN 1000 MCG/ML IJ SOLN: 1.0000 mg | INTRAMUSCULAR | 1 refills | Status: DC

## 2022-11-06 MED ORDER — OMEPRAZOLE 40 MG PO CPDR: 40.0000 mg | DELAYED_RELEASE_CAPSULE | Freq: Two times a day (BID) | ORAL | 1 refills | Status: DC

## 2022-11-27 ENCOUNTER — Encounter: Payer: Self-pay | Admitting: Family Medicine

## 2022-11-27 NOTE — Telephone Encounter (Signed)
Letter printed.

## 2022-12-10 ENCOUNTER — Other Ambulatory Visit: Payer: Self-pay | Admitting: Family Medicine

## 2022-12-10 ENCOUNTER — Other Ambulatory Visit (HOSPITAL_COMMUNITY): Payer: Self-pay

## 2022-12-10 MED ORDER — GABAPENTIN 100 MG PO CAPS
100.0000 mg | ORAL_CAPSULE | Freq: Every day | ORAL | 3 refills | Status: DC
  Filled 2022-12-10: qty 450, 90d supply, fill #0
  Filled 2023-01-01: qty 150, 30d supply, fill #0

## 2022-12-17 ENCOUNTER — Encounter: Payer: Self-pay | Admitting: Family Medicine

## 2022-12-17 NOTE — Telephone Encounter (Signed)
Please do letter stating it is okay for patient to return to work on 12/18/2022.

## 2023-01-01 ENCOUNTER — Other Ambulatory Visit (HOSPITAL_COMMUNITY): Payer: Self-pay

## 2023-01-15 ENCOUNTER — Encounter: Payer: Self-pay | Admitting: Family Medicine

## 2023-01-16 ENCOUNTER — Other Ambulatory Visit: Payer: Self-pay

## 2023-01-16 ENCOUNTER — Other Ambulatory Visit: Payer: Self-pay | Admitting: Family Medicine

## 2023-01-16 MED ORDER — ALPRAZOLAM 1 MG PO TABS: ORAL_TABLET | ORAL | 0 refills | Status: DC

## 2023-01-16 MED ORDER — OMEPRAZOLE 40 MG PO CPDR: 40.0000 mg | DELAYED_RELEASE_CAPSULE | Freq: Two times a day (BID) | ORAL | 1 refills | Status: DC

## 2023-01-16 MED ORDER — CYANOCOBALAMIN 1000 MCG/ML IJ SOLN: 1.0000 mg | INTRAMUSCULAR | 1 refills | Status: DC

## 2023-01-16 MED ORDER — LISINOPRIL 20 MG PO TABS: 20.0000 mg | ORAL_TABLET | Freq: Every day | ORAL | 1 refills | Status: DC

## 2023-01-16 NOTE — Telephone Encounter (Signed)
Pt is requesting refill on Xanax sent to [facility] on [address]. Next ov is 02/14/23

## 2023-01-16 NOTE — Telephone Encounter (Signed)
1 mo supply of xanax sent

## 2023-01-22 ENCOUNTER — Encounter (HOSPITAL_COMMUNITY): Payer: Self-pay | Admitting: Psychiatry

## 2023-01-22 ENCOUNTER — Telehealth (HOSPITAL_BASED_OUTPATIENT_CLINIC_OR_DEPARTMENT_OTHER): Payer: Self-pay | Admitting: Psychiatry

## 2023-01-22 ENCOUNTER — Telehealth (HOSPITAL_COMMUNITY): Payer: Self-pay | Admitting: Psychiatry

## 2023-01-22 ENCOUNTER — Telehealth: Payer: Self-pay

## 2023-01-22 ENCOUNTER — Other Ambulatory Visit: Payer: Self-pay

## 2023-01-22 ENCOUNTER — Encounter: Payer: Self-pay | Admitting: Family Medicine

## 2023-01-22 DIAGNOSIS — F331 Major depressive disorder, recurrent, moderate: Secondary | ICD-10-CM

## 2023-01-22 DIAGNOSIS — F429 Obsessive-compulsive disorder, unspecified: Secondary | ICD-10-CM

## 2023-01-22 DIAGNOSIS — F419 Anxiety disorder, unspecified: Secondary | ICD-10-CM

## 2023-01-22 MED ORDER — GABAPENTIN 300 MG PO CAPS: 300.0000 mg | ORAL_CAPSULE | Freq: Two times a day (BID) | ORAL | 0 refills | Status: DC

## 2023-01-22 MED ORDER — LAMOTRIGINE 150 MG PO TABS: 150.0000 mg | ORAL_TABLET | Freq: Every day | ORAL | 0 refills | Status: DC

## 2023-01-22 MED ORDER — DULOXETINE HCL 40 MG PO CPEP: 40.0000 mg | ORAL_CAPSULE | Freq: Every day | ORAL | 0 refills | Status: DC

## 2023-01-22 NOTE — Progress Notes (Signed)
[facility] MD Virtual Progress Note   Patient Location: At [employer] Provider Location: Home Office  I connect with patient by telephone and verified that I am speaking with correct person by using two identifiers. I discussed the limitations of evaluation and management by telemedicine and the availability of in person appointments. I also discussed with the patient that there may be a patient responsible charge related to this service. The patient expressed understanding and agreed to proceed.  Sobia Karger [id number] 47 y.o.  01/22/2023 11:32 AM  History of Present Illness:  Patient is evaluated by phone session.  She is at [employer] and cannot do video.  She reported continued to stress and anxiety.  She did get a job at [employer] but left after 4 weeks after she did not like she was treated there.  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  Patient told it was overwhelming and she decided to quit.  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  She understands that there are a lot of jobs for very short amount of time in the past year but hoping job or legal will last for long.  She reported sleep is okay.  She is pleased that her 47 year old doing associates from [employer] and next year he will get a college degree.  Her 39 year old son works as a Personnel officer in [employer].  Patient told she had a good time with the kids on Easter.  She reported sleep is better and her obsessive-compulsive thoughts are also under control.  She denies any major panic attack, crying spells or any feeling of hopelessness or worthlessness.  Her appetite is okay.  However she does have chronic depression, anxiety, nervousness.  Her PCP resumed the Xanax.  She is seeing Dr. Harlen Labs.  She has no tremor or shakes or any EPS.  She denies any  hallucination, paranoia.  We have increased the gabapentin on the last visit it is prescribed by her pain doctor Dr. Modesta Messing.  She was only taking 100 mg gabapentin 4 times a day and we have recommended to take 300 mg 2 times a day.  She noticed it helps anxiety, pain and sleep.  She like to get the gabapentin from our office.  Her pain doctor giving Percocet.  She is living with her mother and there are days when she gets anxious, irritable with her but manageable.  She denies drinking or using any illegal substances.  Past Psychiatric History: H/O depression after got married.  Tried Zoloft for years but stopped after seizure. PCP tried trazodone, Ambien, Doxepin and Seroquel but  did not work.  We tried higher dose of Cymbalta but developed side effects.  H/O irritability, poor impulse control with excessive buying and shopping but no history of mania, psychosis, suicidal attempt or inpatient treatment.    Outpatient Encounter Medications as of 01/22/2023  Medication Sig   ALPRAZolam (XANAX) 1 MG tablet Take 2 tablets (2 mg total) by mouth in the morning AND 2 tablets (2 mg total) at bedtime.   Calcium 250 MG CAPS Take 1 capsule by mouth 3 (three) times daily.   cyanocobalamin (VITAMIN B12) 1000 MCG/ML injection Inject 1 mL (1,000 mcg total) into the muscle every 2 weeks   DULoxetine HCl 40 MG CPEP Take 1 capsule (40 mg) by mouth daily.  ferrous gluconate (FERGON) 324 MG tablet Take 1 tablet (324 mg total) by mouth daily with breakfast.   gabapentin (NEURONTIN) 300 MG capsule Take 1 capsule (300 mg total) by mouth 2 (two) times daily.   glucose blood (FREESTYLE LITE) test strip USE TO CHECK BLOOD SUGAR 3 TIMES A DAY   lamoTRIgine (LAMICTAL) 150 MG tablet Take 1 tablet (150 mg total) by mouth daily.   lisinopril (ZESTRIL) 20 MG tablet Take 1 tablet (20 mg total) by mouth daily.   omeprazole (PRILOSEC) 40 MG capsule Take 1 capsule (40 mg total) by mouth 2 (two) times daily.   oxyCODONE-acetaminophen  (PERCOCET) 10-325 MG tablet Take 1 tablet by mouth every 6 (six) hours as needed for pain.   rizatriptan (MAXALT-MLT) 10 MG disintegrating tablet 1 tab po qd as needed for migraine headache.  May repeat dose in 2 hours if needed.  Max dose in 24h is 20 mg.   Vitamin D, Ergocalciferol, (DRISDOL) 1.25 MG (50000 UNIT) CAPS capsule Take 1 capsule (50,000 Units total) by mouth 2 (two) times a week.   [DISCONTINUED] gabapentin (NEURONTIN) 100 MG capsule Take 1 capsule (100 mg total) by mouth 5 (five) times daily.   [DISCONTINUED] hydrOXYzine (ATARAX) 25 MG tablet Take 1 tablet (25 mg total) by mouth every 6 (six) hours.   No facility-administered encounter medications on file as of 01/22/2023.    No results found for this or any previous visit (from the past 2160 hour(s)).   Psychiatric Specialty Exam: Physical Exam  Review of Systems  Weight 250 lb (113.4 kg).There is no height or weight on file to calculate BMI.  General Appearance: NA  Eye Contact:  NA  Speech:  Normal Rate  Volume:  Decreased  Mood:  Dysphoric  Affect:  NA  Thought Process:  Goal Directed  Orientation:  Full (Time, Place, and Person)  Thought Content:  Rumination  Suicidal Thoughts:  No  Homicidal Thoughts:  No  Memory:  Immediate;   Good Recent;   Good Remote;   Good  Judgement:  Fair  Insight:  Shallow  Psychomotor Activity:  NA  Concentration:  Concentration: Fair and Attention Span: Fair  Recall:  Good  Fund of Knowledge:  Good  Language:  Good  Akathisia:  No  Handed:  Right  AIMS (if indicated):     Assets:  Communication Skills Desire for Improvement Talents/Skills Transportation  ADL's:  Intact  Cognition:  WNL  Sleep: Better     Assessment/Plan: Anxiety - Plan: DULoxetine HCl 40 MG CPEP, gabapentin (NEURONTIN) 300 MG capsule  MDD (major depressive disorder), recurrent episode, moderate - Plan: DULoxetine HCl 40 MG CPEP, gabapentin (NEURONTIN) 300 MG capsule, lamoTRIgine (LAMICTAL) 150 MG  tablet  Obsessive-compulsive disorder, unspecified type - Plan: gabapentin (NEURONTIN) 300 MG capsule  Discussed chronic anxiety, depression, nervousness.  Discussed her job situation as patient has left, quit and laid off from previous job in past 1 year.  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  She is hoping she should last year long.  I did talk about considering going back in therapy since patient used to see Perlie Mayo in the past and that was helpful.  Patient agreed once she is financially stable and back to work then she will give [place] a call to schedule appointment with therapy.  She is getting Xanax from her primary care doctor.  She like to get gabapentin from [place] which helped her anxiety and sleep and pain.  Continue Cymbalta 40 mg daily, gabapentin 300 mg 2 times a day and Lamictal 150 mg daily.  She has no rash, itching tremors or shakes.  We will follow-up in 3 months.  Patient like to send her prescription to local pharmacy at [facility].   Follow Up Instructions:     I discussed the assessment and treatment plan with the patient. The patient was provided an opportunity to ask questions and all were answered. The patient agreed with the plan and demonstrated an understanding of the instructions.   The patient was advised to call back or seek an in-person evaluation if the symptoms worsen or if the condition fails to improve as anticipated.    Collaboration of Care: Other provider involved in patient's care AEB notes are available in epic to review.  Patient/Guardian was advised Release of Information must be obtained prior to any record release in order to collaborate their care with an outside provider. Patient/Guardian was advised if they have not already done so to contact the registration department to sign all necessary forms in order for [place] to release information regarding their care.   Consent: Patient/Guardian gives verbal consent for  treatment and assignment of benefits for services provided during this visit. Patient/Guardian expressed understanding and agreed to proceed.     I provided 28 minutes of non face to face time during this encounter.  Note: This document was prepared by [employer] voice dictation technology and any errors that results from this process are unintentional.    Cleotis Nipper, MD 01/22/2023

## 2023-01-23 NOTE — Telephone Encounter (Signed)
Message already sent to PA team for assistance

## 2023-01-27 ENCOUNTER — Encounter: Payer: Self-pay | Admitting: Family Medicine

## 2023-01-28 NOTE — Telephone Encounter (Signed)
PA was denied for coverage, medication is not covered under plan.  Please advise on next steps for pt.

## 2023-01-28 NOTE — Telephone Encounter (Signed)
Toni [name] (Key: [id number]) PA Case ID #: [id number]  PA denied as of 01/28/23. Message sent to provider for alternative meds.

## 2023-01-28 NOTE — Telephone Encounter (Signed)
Patient calling to check status of PA for Omeprazole.  Please send message to patient via mychart.

## 2023-01-29 NOTE — Telephone Encounter (Signed)
Please have her find out what is covered under her plan.

## 2023-02-07 ENCOUNTER — Other Ambulatory Visit (HOSPITAL_COMMUNITY): Payer: Self-pay

## 2023-02-07 NOTE — Telephone Encounter (Signed)
Completing message for the RX PA team. Someone else has already requested the pa and it was denied.

## 2023-02-10 ENCOUNTER — Other Ambulatory Visit: Payer: Self-pay | Admitting: Family Medicine

## 2023-02-10 ENCOUNTER — Telehealth (HOSPITAL_COMMUNITY): Payer: Self-pay | Admitting: *Deleted

## 2023-02-10 NOTE — Telephone Encounter (Signed)
Please confirm if refill appropriate. As of 09/19/22 phone note, pt was no longer being prescribed any controlled medications.

## 2023-02-10 NOTE — Telephone Encounter (Signed)
PA FOR DULOXETINE  HCL 40 MG CPEP SUBMITTED/FAXED TO [employer] [place] @ [phone].  AWAITING DETERMINATION.

## 2023-02-11 ENCOUNTER — Other Ambulatory Visit (HOSPITAL_COMMUNITY): Payer: Self-pay

## 2023-02-12 ENCOUNTER — Telehealth (HOSPITAL_COMMUNITY): Payer: Self-pay | Admitting: *Deleted

## 2023-02-12 NOTE — Telephone Encounter (Signed)
PA FOR CYMBALTA 40 MG HAS BEEN DENIED BY [employer].

## 2023-02-13 MED ORDER — ALPRAZOLAM 1 MG PO TABS: ORAL_TABLET | ORAL | 2 refills | Status: DC

## 2023-02-14 ENCOUNTER — Encounter: Payer: Medicaid Other | Admitting: Family Medicine

## 2023-02-14 DIAGNOSIS — Z1231 Encounter for screening mammogram for malignant neoplasm of breast: Secondary | ICD-10-CM

## 2023-02-14 DIAGNOSIS — Z1211 Encounter for screening for malignant neoplasm of colon: Secondary | ICD-10-CM

## 2023-02-14 NOTE — Progress Notes (Deleted)
Office Note 02/14/2023  CC: No chief complaint on file.  Patient is a 47 y.o. 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 A/P as of last visit: "#1 hypertension, well controlled on lisinopril 20 mg a day. Electrolytes and creatinine today.   2.  GAD with insomnia. She has done well long-term on alprazolam 1 mg tabs, 2 twice daily. Controlled substance contract is up-to-date. Dr. Lolly Mustache manages the rest of her psychotropic medications.   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 She has some malabsorption associated with this, history of vitamin D, iron, and B12 deficiency. Rechecking levels today.   #4 eactive hypoglycemia.  Associated with #3 above. She is trying to eat frequent small meals. She is getting established with a new endocrinologist in November since her current one retired."  INTERIM HX: ***   PMP AWARE reviewed today: most recent rx for alprazolam was filled 02/13/2023, # 120, rx by me. No red flags.   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  (Normal cervical MRI 2010 per ortho notes).   Easy bruisability 2011   Hx of: saw Hematologist (Dr. Cyndie Chime) and w/u neg for von Willebrand desease.   GERD (gastroesophageal reflux disease)    hx gastric ulcer and upper GI bleed, ? 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 Pt to start FeSO4 325 qd 10/28/19    Irregular menses 03/2018   GYN started Femynor 0.25-0.35.  [facility] as of 10/2018.  u/s wnl.  Endom bx NEG. OCPs helped.   Malabsorption syndrome    due to roux-en-Y   Medial epicondylitis of elbow, left 12/2019   [facility]   Migraine    Mixed hyperlipidemia    Osteoarthritis of carpometacarpal [facility]) joint of right thumb 12/2019   [facility]   Reactive hypoglycemia    Dr. Everardo All   Seizure [facility]) 2015   s/p eval with neuro, no medication, no events since   Syncope    with ? seizure in 2015, eval with neruo   Vitamin B12 deficiency    Vitamin D deficiency     Past Surgical History:  Procedure Laterality Date   CESAREAN SECTION  2006   ELBOW X-RAY Left 12/07/2019   ENDOMETRIAL BIOPSY     EYE SURGERY Bilateral    2001   FINGER X-RAY Right 12/07/2019   ROUX-EN-Y GASTRIC BYPASS  01/05/2002   TONSILLECTOMY AND ADENOIDECTOMY     TUBAL LIGATION  2006   tubes in ears both Bilateral     Family History  Problem Relation Age of Onset   Arthritis Mother    Hyperlipidemia Mother    Heart disease Mother    Diabetes Mother    Asthma Mother    Aneurysm Father        deceased   Lung cancer Maternal Aunt  smoker   Colon cancer Maternal Grandmother    Lung cancer Maternal Aunt        smoker    Social History   Socioeconomic History   Marital status: Legally Separated    Spouse name: Not on file   Number of children: Not on file   Years of education: Not on file   Highest education level: Associate degree: occupational, Scientist, product/process development, or vocational program  Occupational History   Not on file  Tobacco Use   Smoking status: Never   Smokeless tobacco: Never  Vaping Use   Vaping Use: Never used  Substance and Sexual Activity   Alcohol use: Yes    Alcohol/week: 0.0 standard drinks of alcohol    Comment: occasional    Drug use: No   Sexual activity: Not on file  Other Topics Concern   Not on file  Social History Narrative   Married.   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   No T/A/Ds.   Social Determinants of Health   Financial Resource Strain: High Risk (12/07/2021)   Overall Financial Resource Strain (CARDIA)    Difficulty of Paying Living Expenses: Hard  Food Insecurity: Food Insecurity Present (12/07/2021)   Hunger Vital Sign    Worried About Running Out of Food in the Last Year: Sometimes true    Ran Out of Food in the Last Year: Sometimes true  Transportation Needs: Unknown (12/07/2021)   PRAPARE - Administrator, Civil Service (Medical): Not on file    Lack of Transportation (Non-Medical): No  Physical Activity: Insufficiently Active (12/07/2021)   Exercise Vital Sign    Days of Exercise per Week: 3 days    Minutes of Exercise per Session: 30 min  Stress: Stress Concern Present (12/07/2021)   [employer] of Occupational Health - Occupational Stress Questionnaire    Feeling of Stress : Very much  Social Connections: Socially Isolated (12/07/2021)   Social Connection and Isolation Panel [NHANES]    Frequency of Communication with Friends and Family: Three times a week    Frequency of Social Gatherings with Friends and Family: Three times a week    Attends Religious Services: Never    Active Member of Clubs or Organizations: No    Attends Engineer, structural: Not on file    Marital Status: Divorced  Intimate Partner Violence: Not on file    Outpatient Medications Prior to Visit  Medication Sig Dispense Refill   ESTARYLLA 0.25-35 MG-MCG tablet Take 1 tablet by mouth daily.     ALPRAZolam (XANAX) 1 MG tablet 2 tabs po bid 120 tablet 2   Calcium 250 MG CAPS Take 1 capsule by mouth 3 (three) times daily.     cyanocobalamin (VITAMIN B12) 1000 MCG/ML injection Inject 1 mL (1,000 mcg total) into the muscle every 2 weeks 10 mL 1   DULoxetine HCl 40 MG CPEP Take 1 capsule (40 mg) by mouth daily. 90 capsule 0   ferrous gluconate (FERGON) 324 MG tablet Take 1 tablet (324 mg total) by mouth daily with  breakfast. 90 tablet 1   gabapentin (NEURONTIN) 300 MG capsule Take 1 capsule (300 mg total) by mouth 2 (two) times daily. 180 capsule 0   glucose blood (FREESTYLE LITE) test strip USE TO CHECK BLOOD SUGAR 3 TIMES A DAY 200 strip 12   lamoTRIgine (LAMICTAL) 150 MG tablet Take 1 tablet (150 mg total) by mouth daily. 90 tablet 0   lisinopril (ZESTRIL) 20 MG tablet Take  1 tablet (20 mg total) by mouth daily. 90 tablet 1   omeprazole (PRILOSEC) 40 MG capsule Take 1 capsule (40 mg total) by mouth 2 (two) times daily. 180 capsule 1   oxyCODONE-acetaminophen (PERCOCET) 10-325 MG tablet Take 1 tablet by mouth every 6 (six) hours as needed for pain.     rizatriptan (MAXALT-MLT) 10 MG disintegrating tablet 1 tab po qd as needed for migraine headache.  May repeat dose in 2 hours if needed.  Max dose in 24h is 20 mg. 20 tablet 5   Vitamin D, Ergocalciferol, (DRISDOL) 1.25 MG (50000 UNIT) CAPS capsule Take 1 capsule (50,000 Units total) by mouth 2 (two) times a week. 24 capsule 1   No facility-administered medications prior to visit.    Allergies  Allergen Reactions   Ibuprofen     Causes ulcers    Review of Systems *** PE;    01/22/2023   11:45 AM 10/23/2022   10:08 AM 09/22/2022   11:09 AM  Vitals with BMI  Weight     Systolic   112  Diastolic   77  Pulse   72     Information is confidential and restricted. Go to Review Flowsheets to unlock data.     *** Pertinent labs:  Lab Results  Component Value Date   TSH 1.42 11/09/2021   Lab Results  Component Value Date   WBC 8.7 08/16/2022   HGB 12.9 08/16/2022   HCT 39.4 08/16/2022   MCV 89.7 08/16/2022   PLT 416 (H) 08/16/2022   Lab Results  Component Value Date   CREATININE 0.99 08/16/2022   BUN 13 08/16/2022   NA 137 08/16/2022   K 4.1 08/16/2022   CL 104 08/16/2022   CO2 24 08/16/2022   Lab Results  Component Value Date   ALT 9 11/09/2021   AST 15 11/09/2021   ALKPHOS 45 05/10/2021   BILITOT 0.5 11/09/2021   Lab  Results  Component Value Date   CHOL 264 (H) 03/12/2022   Lab Results  Component Value Date   HDL 92.00 03/12/2022   Lab Results  Component Value Date   LDLCALC 89 02/06/2018   Lab Results  Component Value Date   TRIG 226.0 (H) 03/12/2022   Lab Results  Component Value Date   CHOLHDL 3 03/12/2022   Lab Results  Component Value Date   HGBA1C 5.4 03/12/2022   Lab Results  Component Value Date   VITAMINB12 280 08/16/2022   Last vitamin D Lab Results  Component Value Date   VD25OH 41 08/16/2022   Lab Results  Component Value Date   IRON 47 08/16/2022   TIBC 467 (H) 08/16/2022   FERRITIN 4 (L) 08/16/2022   ASSESSMENT AND PLAN:   No problem-specific Assessment & Plan notes found for this encounter.   An After Visit Summary was printed and given to the patient.  FOLLOW UP:  No follow-ups on file.  @esig @

## 2023-02-18 ENCOUNTER — Other Ambulatory Visit (HOSPITAL_COMMUNITY): Payer: Self-pay | Admitting: Psychiatry

## 2023-02-18 DIAGNOSIS — F429 Obsessive-compulsive disorder, unspecified: Secondary | ICD-10-CM

## 2023-02-18 DIAGNOSIS — F419 Anxiety disorder, unspecified: Secondary | ICD-10-CM

## 2023-02-18 DIAGNOSIS — F331 Major depressive disorder, recurrent, moderate: Secondary | ICD-10-CM

## 2023-03-10 ENCOUNTER — Encounter: Payer: Medicaid Other | Admitting: Family Medicine

## 2023-04-14 ENCOUNTER — Other Ambulatory Visit: Payer: Self-pay | Admitting: Family Medicine

## 2023-04-21 ENCOUNTER — Other Ambulatory Visit (HOSPITAL_COMMUNITY): Payer: Self-pay | Admitting: *Deleted

## 2023-04-21 DIAGNOSIS — F429 Obsessive-compulsive disorder, unspecified: Secondary | ICD-10-CM

## 2023-04-21 DIAGNOSIS — F331 Major depressive disorder, recurrent, moderate: Secondary | ICD-10-CM

## 2023-04-21 DIAGNOSIS — F419 Anxiety disorder, unspecified: Secondary | ICD-10-CM

## 2023-04-21 MED ORDER — GABAPENTIN 300 MG PO CAPS
300.0000 mg | ORAL_CAPSULE | Freq: Two times a day (BID) | ORAL | 0 refills | Status: DC
Start: 2023-04-21 — End: 2023-04-22

## 2023-04-22 ENCOUNTER — Other Ambulatory Visit (HOSPITAL_COMMUNITY): Payer: Self-pay

## 2023-04-22 DIAGNOSIS — F331 Major depressive disorder, recurrent, moderate: Secondary | ICD-10-CM

## 2023-04-22 DIAGNOSIS — F429 Obsessive-compulsive disorder, unspecified: Secondary | ICD-10-CM

## 2023-04-22 DIAGNOSIS — F419 Anxiety disorder, unspecified: Secondary | ICD-10-CM

## 2023-04-22 MED ORDER — GABAPENTIN 300 MG PO CAPS
300.0000 mg | ORAL_CAPSULE | Freq: Two times a day (BID) | ORAL | 0 refills | Status: DC
Start: 2023-04-22 — End: 2023-05-20

## 2023-04-22 MED ORDER — DULOXETINE HCL 40 MG PO CPEP
40.0000 mg | ORAL_CAPSULE | Freq: Every day | ORAL | 0 refills | Status: DC
Start: 2023-04-22 — End: 2023-05-20

## 2023-04-22 MED ORDER — LAMOTRIGINE 150 MG PO TABS
150.0000 mg | ORAL_TABLET | Freq: Every day | ORAL | 0 refills | Status: DC
Start: 2023-04-22 — End: 2023-05-20

## 2023-04-23 ENCOUNTER — Telehealth (HOSPITAL_COMMUNITY): Payer: Self-pay | Admitting: Psychiatry

## 2023-04-24 ENCOUNTER — Telehealth (HOSPITAL_COMMUNITY): Payer: Self-pay | Admitting: Psychiatry

## 2023-04-25 ENCOUNTER — Telehealth (HOSPITAL_COMMUNITY): Payer: BLUE CROSS/BLUE SHIELD | Admitting: Psychiatry

## 2023-04-25 ENCOUNTER — Other Ambulatory Visit (HOSPITAL_COMMUNITY): Payer: Self-pay

## 2023-04-25 ENCOUNTER — Telehealth: Payer: Self-pay

## 2023-04-25 NOTE — Telephone Encounter (Signed)
Pharmacy Patient Advocate Encounter   Received notification from Fax that prior authorization for Omeprazole 40MG  dr capsules is required/requested.   Insurance verification completed.   The patient is insured through [employer] .   Per test claim: PA submitted to [employer] via CoverMyMeds Key/confirmation #/EOC [id number] Status is pending

## 2023-04-25 NOTE — Telephone Encounter (Signed)
Pharmacy Patient Advocate Encounter  Received notification from [employer] that Prior Authorization for Omeprazole 40MG  dr capsules has been APPROVED from 04/25/23 to 04/24/24.Marland Kitchen  PA #/Case ID/Reference #: [id number]  Copay is $29 for 90 day supply

## 2023-05-16 ENCOUNTER — Ambulatory Visit: Payer: Managed Care, Other (non HMO) | Admitting: Family Medicine

## 2023-05-16 ENCOUNTER — Encounter: Payer: Self-pay | Admitting: Family Medicine

## 2023-05-16 VITALS — BP 137/91 | HR 93 | Wt 274.4 lb

## 2023-05-16 DIAGNOSIS — E559 Vitamin D deficiency, unspecified: Secondary | ICD-10-CM

## 2023-05-16 DIAGNOSIS — R5382 Chronic fatigue, unspecified: Secondary | ICD-10-CM

## 2023-05-16 DIAGNOSIS — F411 Generalized anxiety disorder: Secondary | ICD-10-CM

## 2023-05-16 DIAGNOSIS — Z1211 Encounter for screening for malignant neoplasm of colon: Secondary | ICD-10-CM

## 2023-05-16 DIAGNOSIS — Z79899 Other long term (current) drug therapy: Secondary | ICD-10-CM | POA: Diagnosis not present

## 2023-05-16 DIAGNOSIS — G43909 Migraine, unspecified, not intractable, without status migrainosus: Secondary | ICD-10-CM

## 2023-05-16 DIAGNOSIS — E538 Deficiency of other specified B group vitamins: Secondary | ICD-10-CM | POA: Diagnosis not present

## 2023-05-16 DIAGNOSIS — Z0000 Encounter for general adult medical examination without abnormal findings: Secondary | ICD-10-CM

## 2023-05-16 DIAGNOSIS — E611 Iron deficiency: Secondary | ICD-10-CM

## 2023-05-16 DIAGNOSIS — Z9884 Bariatric surgery status: Secondary | ICD-10-CM

## 2023-05-16 DIAGNOSIS — Z0001 Encounter for general adult medical examination with abnormal findings: Secondary | ICD-10-CM

## 2023-05-16 DIAGNOSIS — I10 Essential (primary) hypertension: Secondary | ICD-10-CM

## 2023-05-16 DIAGNOSIS — K9089 Other intestinal malabsorption: Secondary | ICD-10-CM

## 2023-05-16 LAB — COMPREHENSIVE METABOLIC PANEL
ALT: 7 U/L (ref 0–35)
AST: 12 U/L (ref 0–37)
Albumin: 3.8 g/dL (ref 3.5–5.2)
Alkaline Phosphatase: 47 U/L (ref 39–117)
BUN: 11 mg/dL (ref 6–23)
CO2: 24 mEq/L (ref 19–32)
Calcium: 8.8 mg/dL (ref 8.4–10.5)
Chloride: 103 mEq/L (ref 96–112)
Creatinine, Ser: 0.78 mg/dL (ref 0.40–1.20)
GFR: 90.36 mL/min (ref >60.00)
Glucose, Bld: 87 mg/dL (ref 70–99)
Potassium: 4.1 mEq/L (ref 3.5–5.1)
Sodium: 136 mEq/L (ref 135–145)
Total Bilirubin: 0.3 mg/dL (ref 0.2–1.2)
Total Protein: 6.1 g/dL (ref 6.0–8.3)

## 2023-05-16 LAB — CBC WITH DIFFERENTIAL/PLATELET
Basophils Absolute: 0 10*3/uL (ref 0.0–0.1)
Basophils Relative: 0.5 % (ref 0.0–3.0)
Eosinophils Absolute: 0.1 10*3/uL (ref 0.0–0.7)
Eosinophils Relative: 2 % (ref 0.0–5.0)
HCT: 38.4 % (ref 36.0–46.0)
Hemoglobin: 12.2 g/dL (ref 12.0–15.0)
Lymphocytes Relative: 55.3 % — ABNORMAL HIGH (ref 12.0–46.0)
Lymphs Abs: 3.1 10*3/uL (ref 0.7–4.0)
MCHC: 31.6 g/dL (ref 30.0–36.0)
MCV: 89.4 fl (ref 78.0–100.0)
Monocytes Absolute: 0.5 10*3/uL (ref 0.1–1.0)
Monocytes Relative: 8.8 % (ref 3.0–12.0)
Neutro Abs: 1.8 10*3/uL (ref 1.4–7.7)
Neutrophils Relative %: 33.4 % — ABNORMAL LOW (ref 43.0–77.0)
Platelets: 344 10*3/uL (ref 150.0–400.0)
RBC: 4.3 Mil/uL (ref 3.87–5.11)
RDW: 20.3 % — ABNORMAL HIGH (ref 11.5–15.5)
WBC: 5.5 10*3/uL (ref 4.0–10.5)

## 2023-05-16 LAB — LIPID PANEL
Cholesterol: 182 mg/dL (ref 0–200)
HDL: 78 mg/dL (ref >39.00)
LDL Cholesterol: 78 mg/dL (ref 0–99)
NonHDL: 103.92
Total CHOL/HDL Ratio: 2
Triglycerides: 130 mg/dL (ref 0.0–149.0)
VLDL: 26 mg/dL (ref 0.0–40.0)

## 2023-05-16 LAB — IBC + FERRITIN
Ferritin: 34.7 ng/mL (ref 10.0–291.0)
Iron: 42 ug/dL (ref 42–145)
Saturation Ratios: 11.3 % — ABNORMAL LOW (ref 20.0–50.0)
TIBC: 371 ug/dL (ref 250.0–450.0)
Transferrin: 265 mg/dL (ref 212.0–360.0)

## 2023-05-16 LAB — TSH: TSH: 0.35 u[IU]/mL (ref 0.35–5.50)

## 2023-05-16 LAB — VITAMIN B12: Vitamin B-12: 1406 pg/mL — ABNORMAL HIGH (ref 211–911)

## 2023-05-16 LAB — VITAMIN D 25 HYDROXY (VIT D DEFICIENCY, FRACTURES): VITD: 31.65 ng/mL (ref 30.00–100.00)

## 2023-05-16 MED ORDER — UBRELVY 50 MG PO TABS: ORAL_TABLET | ORAL | 1 refills | Status: DC

## 2023-05-16 MED ORDER — ALPRAZOLAM 1 MG PO TABS: ORAL_TABLET | ORAL | 0 refills | Status: DC

## 2023-05-16 MED ORDER — VITAMIN D (ERGOCALCIFEROL) 1.25 MG (50000 UNIT) PO CAPS: 50000.0000 [IU] | ORAL_CAPSULE | ORAL | 1 refills | Status: DC

## 2023-05-16 NOTE — Progress Notes (Signed)
Office Note 05/16/2023  CC:  Chief Complaint  Patient presents with   Medical Management of Chronic Issues   Patient is a 47 y.o. female who is here for annual health maintenance exam and follow-up hypertension, anxiety with high risk medication use. History of malabsorption syndrome due to gastric bypass surgery.  INTERIM HX:  Lots of stress lately.  Her mom died recently. She has a job in billing with diagnostic radiology. Has chronic fatigue.  Migraine headaches occurring more lately.  Says Maxalt is not helping anymore. Fortunately, her symptoms of postprandial hypoglycemia are avoidable lately because she is remembering to eat frequently.  Menses have been regular on OCP per her GYN MD.  However her most recent period has been going on a couple of weeks, which is unusual for her. She has no hot flashes.  She is cold all the time.  She has mood disorder managed by psychiatrist, is on duloxetine and lamotrigine. I have been managing her alprazolam for anxiety. She has been on Xanax 2 mg twice daily long-term.  No home blood pressure monitoring. She takes vitamin B-12 and vitamin D supplement.  She is unable to tolerate oral iron preparations.  PMP AWARE reviewed today: most recent rx for alprazolam 1 mg was filled 05/12/2023, # 120, rx by me. No red flags.  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  (Normal cervical MRI 2010 per ortho notes).   Easy bruisability 2011   Hx of: saw Hematologist (Dr. Cyndie Chime) and w/u neg for von Willebrand desease.   GERD (gastroesophageal reflux disease)    hx gastric ulcer and upper GI bleed, ? NSAID induced   History of PCR DNA positive for HSV1    History  of PCR DNA positive for HSV2    History of stomach ulcers    Hypertension    Iron deficiency 10/2019   WITHOUT anemia-suspect malabsorption. Pt to start FeSO4 325 qd 10/28/19   Irregular menses 03/2018   GYN started Femynor 0.25-0.35.  [facility] as of 10/2018.  u/s wnl.  Endom bx NEG. OCPs helped.   Malabsorption syndrome    due to roux-en-Y   Medial epicondylitis of elbow, left 12/2019   [facility]   Migraine    Mixed hyperlipidemia    Osteoarthritis of carpometacarpal [facility]) joint of right thumb 12/2019   [facility]   Reactive hypoglycemia    Dr. Everardo All   Seizure [facility]) 2015   s/p eval with neuro, no medication, no events since   Syncope    with ? seizure in 2015, eval with neruo   Vitamin B12 deficiency    Vitamin D deficiency     Past Surgical History:  Procedure Laterality Date   CESAREAN SECTION  2006   ELBOW X-RAY Left 12/07/2019   ENDOMETRIAL BIOPSY     EYE SURGERY Bilateral    2001   FINGER X-RAY Right 12/07/2019   ROUX-EN-Y GASTRIC BYPASS  01/05/2002   TONSILLECTOMY AND ADENOIDECTOMY     TUBAL LIGATION  2006   tubes in ears both Bilateral     Family History  Problem Relation Age of Onset   Arthritis Mother    Hyperlipidemia Mother  Heart disease Mother    Diabetes Mother    Asthma Mother    Aneurysm Father        deceased   Lung cancer Maternal Aunt        smoker   Colon cancer Maternal Grandmother    Lung cancer Maternal Aunt        smoker    Social History   Socioeconomic History   Marital status: Legally Separated    Spouse name: Not on file   Number of children: Not on file   Years of education: Not on file   Highest education level: Associate degree: occupational, Scientist, product/process development, or vocational program  Occupational History   Not on file  Tobacco Use   Smoking status: Never   Smokeless tobacco: Never  Vaping Use   Vaping status: Never Used  Substance and Sexual Activity   Alcohol use: Yes    Alcohol/week: 0.0 standard  drinks of alcohol    Comment: occasional    Drug use: No   Sexual activity: Not on file  Other Topics Concern   Not on file  Social History Narrative   Married.   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   No T/A/Ds.   Social Determinants of Health   Financial Resource Strain: High Risk (12/07/2021)   Overall Financial Resource Strain (CARDIA)    Difficulty of Paying Living Expenses: Hard  Food Insecurity: Food Insecurity Present (12/07/2021)   Hunger Vital Sign    Worried About Running Out of Food in the Last Year: Sometimes true    Ran Out of Food in the Last Year: Sometimes true  Transportation Needs: Unknown (12/07/2021)   PRAPARE - Administrator, Civil Service (Medical): Not on file    Lack of Transportation (Non-Medical): No  Physical Activity: Insufficiently Active (12/07/2021)   Exercise Vital Sign    Days of Exercise per Week: 3 days    Minutes of Exercise per Session: 30 min  Stress: Stress Concern Present (12/07/2021)   [employer] of Occupational Health - Occupational Stress Questionnaire    Feeling of Stress : Very much  Social Connections: Socially Isolated (12/07/2021)   Social Connection and Isolation Panel [NHANES]    Frequency of Communication with Friends and Family: Three times a week    Frequency of Social Gatherings with Friends and Family: Three times a week    Attends Religious Services: Never    Active Member of Clubs or Organizations: No    Attends Engineer, structural: Not on file    Marital Status: Divorced  Intimate Partner Violence: Not on file    Outpatient Medications Prior to Visit  Medication Sig Dispense Refill   Calcium 250 MG CAPS Take 1 capsule by mouth 3 (three) times daily.     cyanocobalamin (VITAMIN B12) 1000 MCG/ML injection Inject 1 mL (1,000 mcg total) into the muscle every 2 weeks 10 mL 1   DULoxetine HCl 40 MG CPEP Take 1 capsule (40 mg) by mouth daily. 30 capsule 0   ESTARYLLA 0.25-35 MG-MCG tablet Take 1  tablet by mouth daily.     ferrous gluconate (FERGON) 324 MG tablet Take 1 tablet (324 mg total) by mouth daily with breakfast. 90 tablet 1   gabapentin (NEURONTIN) 300 MG capsule Take 1 capsule (300 mg total) by mouth 2 (two) times daily. 60 capsule 0   glucose blood (FREESTYLE LITE) test strip USE TO CHECK BLOOD SUGAR 3 TIMES A DAY 200 strip 12  lamoTRIgine (LAMICTAL) 150 MG tablet Take 1 tablet (150 mg total) by mouth daily. 30 tablet 0   lisinopril (ZESTRIL) 20 MG tablet TAKE 1 TABLET(20 MG) BY MOUTH DAILY 90 tablet 1   naloxone (NARCAN) nasal spray 4 mg/0.1 mL Place 1 spray into the nose once.     omeprazole (PRILOSEC) 40 MG capsule Take 1 capsule (40 mg total) by mouth 2 (two) times daily. 180 capsule 1   oxyCODONE-acetaminophen (PERCOCET) 10-325 MG tablet Take 1 tablet by mouth every 6 (six) hours as needed for pain.     rizatriptan (MAXALT-MLT) 10 MG disintegrating tablet 1 tab po qd as needed for migraine headache.  May repeat dose in 2 hours if needed.  Max dose in 24h is 20 mg. 20 tablet 5   ALPRAZolam (XANAX) 1 MG tablet TAKE 2 TABLETS BY MOUTH TWICE DAILY 120 tablet 0   Vitamin D, Ergocalciferol, (DRISDOL) 1.25 MG (50000 UNIT) CAPS capsule Take 1 capsule (50,000 Units total) by mouth 2 (two) times a week. 24 capsule 1   No facility-administered medications prior to visit.    Allergies  Allergen Reactions   Ibuprofen     Causes ulcers    Review of Systems  Constitutional:  Positive for fatigue. Negative for appetite change, chills and fever.  HENT:  Negative for congestion, dental problem, ear pain and sore throat.   Eyes:  Negative for discharge, redness and visual disturbance.  Respiratory:  Negative for cough, chest tightness, shortness of breath and wheezing.   Cardiovascular:  Negative for chest pain, palpitations and leg swelling.  Gastrointestinal:  Negative for abdominal pain, blood in stool, diarrhea, nausea and vomiting.  Genitourinary:  Negative for difficulty  urinating, dysuria, flank pain, frequency, hematuria and urgency.  Musculoskeletal:  Negative for arthralgias, back pain, joint swelling, myalgias and neck stiffness.  Skin:  Negative for pallor and rash.  Neurological:  Positive for headaches. Negative for dizziness, speech difficulty and weakness.  Hematological:  Negative for adenopathy. Does not bruise/bleed easily.  Psychiatric/Behavioral:  Positive for decreased concentration and sleep disturbance. Negative for confusion. The patient is nervous/anxious.     PE;    05/16/2023    1:37 PM 01/22/2023   11:45 AM 10/23/2022   10:08 AM  Vitals with BMI  Weight 274 lbs 6 oz    Systolic 137    Diastolic 91    Pulse 93       Information is confidential and restricted. Go to Review Flowsheets to unlock data.    Exam chaperoned by [employer], CMA Gen: Alert, well appearing.  Patient is oriented to person, place, time, and situation. AFFECT: pleasant, lucid thought and speech. ENT: Ears: EACs clear, normal epithelium.  TMs with good light reflex and landmarks bilaterally.  Eyes: no injection, icteris, swelling, or exudate.  EOMI, PERRLA. Nose: no drainage or turbinate edema/swelling.  No injection or focal lesion.  Mouth: lips without lesion/swelling.  Oral mucosa pink and moist.  Dentition intact and without obvious caries or gingival swelling.  Oropharynx without erythema, exudate, or swelling.  Neck: supple/nontender.  No LAD, mass, or TM.  Carotid pulses 2+ bilaterally, without bruits. CV: RRR, no m/r/g.   LUNGS: CTA bilat, nonlabored resps, good aeration in all lung fields. ABD: soft, NT, ND, BS normal.  No hepatospenomegaly or mass.  No bruits. EXT: no clubbing, cyanosis, or edema.  Musculoskeletal: no joint swelling, erythema, warmth, or tenderness.  ROM of all joints intact. Skin - no sores or suspicious lesions or rashes  or color changes  Pertinent labs:  Lab Results  Component Value Date   TSH 1.42 11/09/2021   Lab Results   Component Value Date   WBC 8.7 08/16/2022   HGB 12.9 08/16/2022   HCT 39.4 08/16/2022   MCV 89.7 08/16/2022   PLT 416 (H) 08/16/2022   Lab Results  Component Value Date   IRON 47 08/16/2022   TIBC 467 (H) 08/16/2022   FERRITIN 4 (L) 08/16/2022    Lab Results  Component Value Date   CREATININE 0.99 08/16/2022   BUN 13 08/16/2022   NA 137 08/16/2022   K 4.1 08/16/2022   CL 104 08/16/2022   CO2 24 08/16/2022   Lab Results  Component Value Date   ALT 9 11/09/2021   AST 15 11/09/2021   ALKPHOS 45 05/10/2021   BILITOT 0.5 11/09/2021   Lab Results  Component Value Date   CHOL 264 (H) 03/12/2022   Lab Results  Component Value Date   HDL 92.00 03/12/2022   Lab Results  Component Value Date   LDLCALC 89 02/06/2018   Lab Results  Component Value Date   TRIG 226.0 (H) 03/12/2022   Lab Results  Component Value Date   CHOLHDL 3 03/12/2022    ASSESSMENT AND PLAN:   #1 health maintenance exam: Reviewed age and gender appropriate health maintenance issues (prudent diet, regular exercise, health risks of tobacco and excessive alcohol, use of seatbelts, fire alarms in home, use of sunscreen).  Also reviewed age and gender appropriate health screening as well as vaccine recommendations. Vaccines: UTD Labs: lipid, TSH, cbc,cmet,vit b12, vit D, iron. Cervical ca screening: per gyn (Dr. Normand Sloop with [facility] Breast ca screening: per gyn. Colon ca screening: due for initial screening.  Options discussed--->cologuard chosen last year but never turned in.  Cologuard ordered again today.  #2 history of Roux-en-Y gastric bypass surgery in the remote past. She had subsequent malabsorption of iron and B12. Continue 1000 mcg sublingual B12 tab. She cannot tolerate oral iron preparations--> constipation/abdominal discomfort. I recommended she try a half of a tab at a time and/or try a multivitamin with iron to see if she tolerates this. Check vitamin B12 level,  CBC, iron panel..  #3 vitamin D deficiency. She has required significantly high dose replacement long-term. Continue 50,000 unit tab twice a week. Check vitamin D level today.  #4 generalized anxiety disorder. We had a conversation again about this.  There has been some discrepancies regarding fill dates of prescriptions, PDMP website data.  I have low suspicion that Victorialynn is abusing or diverting this medication. I have continued with cautious prescribing of this medication for her.  Controlled substance contract renewed today.  Urine drug screen today. She is prescribed pain medication for chronic pain--> Dr. Ethelene Hal.  #5 hypertension.  Control has been borderline.  Initial blood pressure today 137/91.  Repeat manual 132/82. Continue lisinopril 20 mg a day.  #5 migraine headaches. Rizatriptan no longer helpful.  She had the same problem with Relpax in the past. Will do trial of Ubrelvy 50 mg, 1-2 daily as needed.  An After Visit Summary was printed and given to the patient.  FOLLOW UP:  Return in about 6 months (around 11/16/2023) for routine chronic illness f/u.  Signed:  Santiago Bumpers, MD           05/16/2023

## 2023-05-16 NOTE — Patient Instructions (Signed)

## 2023-05-19 ENCOUNTER — Telehealth (HOSPITAL_COMMUNITY): Payer: BLUE CROSS/BLUE SHIELD | Admitting: Psychiatry

## 2023-05-19 ENCOUNTER — Other Ambulatory Visit: Payer: Self-pay | Admitting: Family Medicine

## 2023-05-19 DIAGNOSIS — D72820 Lymphocytosis (symptomatic): Secondary | ICD-10-CM

## 2023-05-20 ENCOUNTER — Other Ambulatory Visit (HOSPITAL_COMMUNITY): Payer: Self-pay

## 2023-05-20 DIAGNOSIS — F331 Major depressive disorder, recurrent, moderate: Secondary | ICD-10-CM

## 2023-05-20 DIAGNOSIS — F429 Obsessive-compulsive disorder, unspecified: Secondary | ICD-10-CM

## 2023-05-20 DIAGNOSIS — F419 Anxiety disorder, unspecified: Secondary | ICD-10-CM

## 2023-05-20 MED ORDER — LAMOTRIGINE 150 MG PO TABS: 150.0000 mg | ORAL_TABLET | Freq: Every day | ORAL | 0 refills | Status: DC

## 2023-05-20 MED ORDER — GABAPENTIN 300 MG PO CAPS: 300.0000 mg | ORAL_CAPSULE | Freq: Two times a day (BID) | ORAL | 0 refills | Status: DC

## 2023-05-20 MED ORDER — DULOXETINE HCL 40 MG PO CPEP: 40.0000 mg | ORAL_CAPSULE | Freq: Every day | ORAL | 0 refills | Status: DC

## 2023-05-21 ENCOUNTER — Telehealth (HOSPITAL_COMMUNITY): Payer: BLUE CROSS/BLUE SHIELD | Admitting: Psychiatry

## 2023-05-23 ENCOUNTER — Encounter (HOSPITAL_COMMUNITY): Payer: Self-pay | Admitting: Psychiatry

## 2023-05-23 ENCOUNTER — Telehealth (HOSPITAL_BASED_OUTPATIENT_CLINIC_OR_DEPARTMENT_OTHER): Payer: BLUE CROSS/BLUE SHIELD | Admitting: Psychiatry

## 2023-05-23 VITALS — Wt 274.0 lb

## 2023-05-23 DIAGNOSIS — F331 Major depressive disorder, recurrent, moderate: Secondary | ICD-10-CM | POA: Diagnosis not present

## 2023-05-23 DIAGNOSIS — F429 Obsessive-compulsive disorder, unspecified: Secondary | ICD-10-CM

## 2023-05-23 DIAGNOSIS — F419 Anxiety disorder, unspecified: Secondary | ICD-10-CM | POA: Diagnosis not present

## 2023-05-23 NOTE — Progress Notes (Signed)
[facility] MD Virtual Progress Note   Patient Location: In Car Provider Location: Home Office  I connect with patient by video and verified that I am speaking with correct person by using two identifiers. I discussed the limitations of evaluation and management by telemedicine and the availability of in person appointments. I also discussed with the patient that there may be a patient responsible charge related to this service. The patient expressed understanding and agreed to proceed.  Toni [name] [id number] 47 y.o.  05/23/2023 11:34 AM  History of Present Illness:  Patient is evaluated by video session.  She is driving the car and recommend to pull over due to safety concern.  Patient told she is in a rash and she has to go to work and does not want to miss the time.  She reported a lot of stress at work.  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  She mentioned reason quitting her job.  This is her fourth job.  Patient told mom passed away on 08-Aug-20242 chronic health issues.  She was living with the patient.  She reported her OCD symptoms still there and sometimes she is fixated on certain things.  She recently picked up her medication and does not need a refill.  Her 37 year old son now started Engineer, manufacturing and her other son is working as a Public relations account executive in [employer].  Patient reports sleep is okay.  She denies any paranoia, hallucination, anger, mood swings.  She admitted having issues with a coworker at work.  She does not want to change the medication.  In the past she has trouble getting the Cymbalta but now finally it is approved and she is taking it as prescribed.  She has no tremor or shakes or any EPS.  Past Psychiatric History: H/O  depression after got married.  Tried Zoloft for years but stopped after seizure. PCP tried trazodone, Ambien, Doxepin and Seroquel but  did not work.  We tried higher dose of Cymbalta but developed side effects.  H/O irritability, poor impulse control with excessive buying and shopping but no history of mania, psychosis, suicidal attempt or inpatient treatment.     Outpatient Encounter Medications as of 05/23/2023  Medication Sig   ALPRAZolam (XANAX) 1 MG tablet TAKE 2 TABLETS BY MOUTH TWICE DAILY   Calcium 250 MG CAPS Take 1 capsule by mouth 3 (three) times daily.   cyanocobalamin (VITAMIN B12) 1000 MCG/ML injection Inject 1 mL (1,000 mcg total) into the muscle every 2 weeks   DULoxetine HCl 40 MG CPEP Take 1 capsule (40 mg) by mouth daily.   ESTARYLLA 0.25-35 MG-MCG tablet Take 1 tablet by mouth daily.   ferrous gluconate (FERGON) 324 MG tablet Take 1 tablet (324 mg total) by mouth daily with breakfast.   gabapentin (NEURONTIN) 300 MG capsule Take 1 capsule (300 mg total) by mouth 2 (two) times daily.   glucose blood (FREESTYLE LITE) test strip USE TO CHECK BLOOD SUGAR 3 TIMES A DAY   lamoTRIgine (LAMICTAL) 150 MG tablet Take 1 tablet (150 mg total) by mouth daily.   lisinopril (ZESTRIL) 20 MG tablet TAKE 1 TABLET(20 MG) BY MOUTH DAILY   naloxone (NARCAN) nasal spray 4 mg/0.1 mL Place 1 spray into the nose  once.   omeprazole (PRILOSEC) 40 MG capsule Take 1 capsule (40 mg total) by mouth 2 (two) times daily.   oxyCODONE-acetaminophen (PERCOCET) 10-325 MG tablet Take 1 tablet by mouth every 6 (six) hours as needed for pain.   Ubrogepant (UBRELVY) 50 MG TABS 1-2 tabs po once a day as needed for migraine headache   Vitamin D, Ergocalciferol, (DRISDOL) 1.25 MG (50000 UNIT) CAPS capsule Take 1 capsule (50,000 Units total) by mouth 2 (two) times a week.   No facility-administered encounter medications on file as of 05/23/2023.    Recent Results (from the past 2160 hour(s))  Drug Monitoring Panel  [phone] , Urine     Status: Abnormal   Collection Time: 05/16/23  2:13 PM  Result Value Ref Range   [facility] Summary      Comment:     Prescribed            Prescribed            Not Prescribed     Consistent            Inconsistent          Inconsistent     ----------            ------------          --------------                                                 Alphahydroxyalprazolam                                                 Norhydrocodone                                                 Noroxycodone                                                 Oxazepam                                                 Oxycodone                                                 Oxymorphone .    Amphetamines NEGATIVE <500 ng/mL   Barbiturates NEGATIVE <300 ng/mL   Benzodiazepines POSITIVE (A) <100 ng/mL   Alphahydroxyalprazolam 167 (H) <25 ng/mL   medMATCH aOH alprazolam INCONSISTENT (A)    Alphahydroxymidazolam NEGATIVE <50 ng/mL   Alphahydroxytriazolam NEGATIVE <50 ng/mL   Aminoclonazepam NEGATIVE <25 ng/mL   Hydroxyethylflurazepam NEGATIVE <50 ng/mL   Lorazepam NEGATIVE <50 ng/mL   Nordiazepam NEGATIVE <50 ng/mL   Oxazepam 257 (H) <50 ng/mL  medMATCH Oxazepam INCONSISTENT (A)    Temazepam NEGATIVE <50 ng/mL   Benzodiazepines Comments      Comment: See Benzodiazepines Notes, LDT Notes   Cocaine Metabolite NEGATIVE <150 ng/mL   Opiates POSITIVE (A) <100 ng/mL   Codeine NEGATIVE <50 ng/mL   Hydrocodone NEGATIVE <50 ng/mL   Hydromorphone NEGATIVE <50 ng/mL   Morphine NEGATIVE <50 ng/mL   Norhydrocodone 63 (H) <50 ng/mL   medMATCH Norhydrocodone INCONSISTENT (A)    Opiates Comments      Comment: See Opiates Notes, LDT Notes   Oxycodone POSITIVE (A) <100 ng/mL   Noroxycodone >10,000 (H) <50 ng/mL   medMATCH Noroxycodone INCONSISTENT (A)    Oxycodone >10,000 (H) <50 ng/mL   medMATCH Oxycodone INCONSISTENT (A)    Oxymorphone 1,817 (H) <50 ng/mL   medMATCH Oxymorphone INCONSISTENT (A)     Oxycodone Comments      Comment: See Oxycodone Notes, LDT Notes   Desmethyltramadol NEGATIVE <100 ng/mL   Tramadol NEGATIVE <100 ng/mL   Tramadol Comments      Comment: See LDT Notes  Vitamin D (25 hydroxy)     Status: None   Collection Time: 05/16/23  2:13 PM  Result Value Ref Range   VITD 31.65 30.00 - 100.00 ng/mL  B12     Status: Abnormal   Collection Time: 05/16/23  2:13 PM  Result Value Ref Range   Vitamin B-12 1,406 (H) 211 - 911 pg/mL  IBC + Ferritin     Status: Abnormal   Collection Time: 05/16/23  2:13 PM  Result Value Ref Range   Iron 42 42 - 145 ug/dL   Transferrin 161.0 960.4 - 360.0 mg/dL   Saturation Ratios 54.0 (L) 20.0 - 50.0 %   Ferritin 34.7 10.0 - 291.0 ng/mL   TIBC 371.0 250.0 - 450.0 mcg/dL  CBC with Differential/Platelet     Status: Abnormal   Collection Time: 05/16/23  2:13 PM  Result Value Ref Range   WBC 5.5 4.0 - 10.5 K/uL   RBC 4.30 3.87 - 5.11 Mil/uL   Hemoglobin 12.2 12.0 - 15.0 g/dL   HCT 98.1 19.1 - 47.8 %   MCV 89.4 78.0 - 100.0 fl   MCHC 31.6 30.0 - 36.0 g/dL   RDW 29.5 (H) 62.1 - 30.8 %   Platelets 344.0 150.0 - 400.0 K/uL   Neutrophils Relative % 33.4 (L) 43.0 - 77.0 %   Lymphocytes Relative 55.3 Repeated and verified X2. (H) 12.0 - 46.0 %   Monocytes Relative 8.8 3.0 - 12.0 %   Eosinophils Relative 2.0 0.0 - 5.0 %   Basophils Relative 0.5 0.0 - 3.0 %   Neutro Abs 1.8 1.4 - 7.7 K/uL   Lymphs Abs 3.1 0.7 - 4.0 K/uL   Monocytes Absolute 0.5 0.1 - 1.0 K/uL   Eosinophils Absolute 0.1 0.0 - 0.7 K/uL   Basophils Absolute 0.0 0.0 - 0.1 K/uL  TSH     Status: None   Collection Time: 05/16/23  2:13 PM  Result Value Ref Range   TSH 0.35 0.35 - 5.50 uIU/mL  Lipid panel     Status: None   Collection Time: 05/16/23  2:13 PM  Result Value Ref Range   Cholesterol 182 0 - 200 mg/dL    Comment: ATP III Classification       Desirable:  < 200 mg/dL               Borderline High:  200 - 239 mg/dL  High:  > = 240 mg/dL   Triglycerides 403.4  0.0 - 149.0 mg/dL    Comment: Normal:  <742 mg/dLBorderline High:  150 - 199 mg/dL   HDL 59.56 >38.75 mg/dL   VLDL 64.3 0.0 - 32.9 mg/dL   LDL Cholesterol 78 0 - 99 mg/dL   Total CHOL/HDL Ratio 2     Comment:                Men          Women1/2 Average Risk     3.4          3.3Average Risk          5.0          4.42X Average Risk          9.6          7.13X Average Risk          15.0          11.0                       NonHDL 103.92     Comment: NOTE:  Non-HDL goal should be 30 mg/dL higher than patient's LDL goal (i.e. LDL goal of < 70 mg/dL, would have non-HDL goal of < 100 mg/dL)  Comprehensive metabolic panel     Status: None   Collection Time: 05/16/23  2:13 PM  Result Value Ref Range   Sodium 136 135 - 145 mEq/L   Potassium 4.1 3.5 - 5.1 mEq/L   Chloride 103 96 - 112 mEq/L   CO2 24 19 - 32 mEq/L   Glucose, Bld 87 70 - 99 mg/dL   BUN 11 6 - 23 mg/dL   Creatinine, Ser 5.18 0.40 - 1.20 mg/dL   Total Bilirubin 0.3 0.2 - 1.2 mg/dL   Alkaline Phosphatase 47 39 - 117 U/L   AST 12 0 - 37 U/L   ALT 7 0 - 35 U/L   Total Protein 6.1 6.0 - 8.3 g/dL   Albumin 3.8 3.5 - 5.2 g/dL   GFR 84.16 >60.63 mL/min    Comment: Calculated using the CKD-EPI Creatinine Equation (2021)   Calcium 8.8 8.4 - 10.5 mg/dL  DM TEMPLATE     Status: None   Collection Time: 05/16/23  2:13 PM  Result Value Ref Range   Notes and Comments      Comment: This drug testing is for medical treatment only. Analysis was performed as non-forensic testing and these results should be used only by healthcare providers to render diagnosis or treatment, or to monitor progress of medical conditions. . Benzodiazepines Notes: aOH Alprazolam detected is consistent with the use of  the drug Alprazolam. . Oxazepam detected is consistent with the use of the  drug Oxazepam. . Oxazepam can be a prescribed drug and is also a  metabolite of Diazepam, Chlordiazepoxide, Clorazepate  and Temazepam. . Opiates  Notes: Norhydrocodone detected is consistent with the use of  the drug Hydrocodone. . The metabolite Hydromorphone is not present at or above  the cutoff. . Oxycodone Notes: Oxycodone, Noroxycodone, Oxymorphone detected is  consistent with the use of the drug Oxycodone. . Oxymorphone detected is consistent with the use of the  drug Oxymorphone. . Oxymorphone can be a prescribed drug and is also a  metabolite of Oxycodone. Marland Kitchen  LDT Notes: Confirmation tests were developed and their analytical  performance characteristics have been determined by  [employer]. It has  not been cleared or approved  by the FDA. This assay has been validated pursuant to  the CLIA regulations and is used for clinical purposes. . . medMATCH(R) enables providers to identify if drug use is consistent or inconsistent with a corresponding prescribed medication(s) list. . . Healthcare Providers needing Interpretation assistance,  please contact us at [phone] ([phone])  M-F, 8am to 10pm EST      Psychiatric Specialty Exam: Physical Exam  Review of Systems  Weight 274 lb (124.3 kg).There is no height or weight on file to calculate BMI.  General Appearance: Casual  Eye Contact:  Fair  Speech:  Slow  Volume:  Decreased  Mood:  Dysphoric  Affect:  Congruent  Thought Process:  Descriptions of Associations: Intact  Orientation:  Full (Time, Place, and Person)  Thought Content:  Rumination  Suicidal Thoughts:  No  Homicidal Thoughts:  No  Memory:  Immediate;   Good Recent;   Good Remote;   Good  Judgement:  Fair  Insight:  Shallow  Psychomotor Activity:  Decreased  Concentration:  Concentration: Fair and Attention Span: Fair  Recall:  [employer] of Knowledge:  Fair  Language:  Good  Akathisia:  No  Handed:  Right  AIMS (if indicated):     Assets:  Communication Skills Desire for Improvement Talents/Skills Transportation  ADL's:  Intact  Cognition:  WNL  Sleep:  fair      Assessment/Plan: MDD (major depressive disorder), recurrent episode, moderate (HCC)  Anxiety  Obsessive-compulsive disorder, unspecified type  I have a long discussion about her chronic symptoms and not getting therapy for her underlying psychiatric illness.  She had quit and laid off from multiple jobs.  Now she had issues with her current job.  I do believe she need coping skills.  I also discussed about recent loss of the mother and offered grief counseling but patient refused.  She wants to continue current medication which she feel working.  I believe patient has limited insight into her illness which she does not like to address and not interested in therapy.  She does not need any new refills.  After some encouragement she consider to see a therapist.  She used to see Denyse Amass however had missed appointment.  I recommend should consider through employee assistance program for her job or call us back if she has difficulty and we can provide some therapy referrals.  For now continue gabapentin 300 mg 2 times a day, Lamictal 150 mg daily and Cymbalta 40 mg daily.  She has no rash, itching or shakes.  Follow-up in 2 months.   Follow Up Instructions:     I discussed the assessment and treatment plan with the patient. The patient was provided an opportunity to ask questions and all were answered. The patient agreed with the plan and demonstrated an understanding of the instructions.   The patient was advised to call back or seek an in-person evaluation if the symptoms worsen or if the condition fails to improve as anticipated.    Collaboration of Care: Other provider involved in patient's care AEB notes are available in epic to review.  Patient/Guardian was advised Release of Information must be obtained prior to any record release in order to collaborate their care with an outside provider. Patient/Guardian was advised if they have not already done so to contact the registration department  to sign all necessary forms in order for [place] to release information regarding their care.   Consent: Patient/Guardian gives verbal consent  for treatment and assignment of benefits for services provided during this visit. Patient/Guardian expressed understanding and agreed to proceed.     I provided 28 minutes of non face to face time during this encounter.  Note: This document was prepared by [employer] voice dictation technology and any errors that results from this process are unintentional.    Cleotis Nipper, MD 05/23/2023

## 2023-05-26 ENCOUNTER — Telehealth (HOSPITAL_COMMUNITY): Payer: Self-pay | Admitting: Licensed Clinical Social Worker

## 2023-05-26 NOTE — Telephone Encounter (Signed)
[name] has not been engaged in therapy since 12/19/21.  Clinician outreached Toni [name] by phone today at 1:42pm to inquire about whether she intends to make a followup appointment soon, and offer assistance with scheduling and referral resources if needed.  [name] did not answer this phone call, so clinician left a voicemail informing her that she would be discharged 30 days from now unless she makes an effort to outreach either I or office staff within 30 day window.  Clinician provided contact numbers for our office in order to return call.     Noralee Stain, [place], LCAS 05/26/23

## 2023-05-29 ENCOUNTER — Other Ambulatory Visit (INDEPENDENT_AMBULATORY_CARE_PROVIDER_SITE_OTHER): Payer: BLUE CROSS/BLUE SHIELD

## 2023-05-29 DIAGNOSIS — D72820 Lymphocytosis (symptomatic): Secondary | ICD-10-CM

## 2023-05-30 LAB — CBC WITH DIFFERENTIAL/PLATELET
Basophils Absolute: 0.1 10*3/uL (ref 0.0–0.1)
Basophils Relative: 0.5 % (ref 0.0–3.0)
Eosinophils Absolute: 0.1 10*3/uL (ref 0.0–0.7)
Eosinophils Relative: 0.5 % (ref 0.0–5.0)
HCT: 36.9 % (ref 36.0–46.0)
Hemoglobin: 11.9 g/dL — ABNORMAL LOW (ref 12.0–15.0)
Lymphocytes Relative: 29.4 % (ref 12.0–46.0)
Lymphs Abs: 2.9 10*3/uL (ref 0.7–4.0)
MCHC: 32.1 g/dL (ref 30.0–36.0)
MCV: 91.9 fl (ref 78.0–100.0)
Monocytes Absolute: 0.6 10*3/uL (ref 0.1–1.0)
Monocytes Relative: 6.3 % (ref 3.0–12.0)
Neutro Abs: 6.2 10*3/uL (ref 1.4–7.7)
Neutrophils Relative %: 63.3 % (ref 43.0–77.0)
Platelets: 307 10*3/uL (ref 150.0–400.0)
RBC: 4.02 Mil/uL (ref 3.87–5.11)
RDW: 20.6 % — ABNORMAL HIGH (ref 11.5–15.5)
WBC: 9.8 10*3/uL (ref 4.0–10.5)

## 2023-06-05 ENCOUNTER — Encounter (HOSPITAL_COMMUNITY): Payer: Self-pay

## 2023-06-05 ENCOUNTER — Ambulatory Visit (HOSPITAL_COMMUNITY): Payer: BLUE CROSS/BLUE SHIELD | Admitting: Licensed Clinical Social Worker

## 2023-06-07 ENCOUNTER — Encounter: Payer: Self-pay | Admitting: Family Medicine

## 2023-06-10 MED ORDER — FAMOTIDINE 20 MG PO TABS: 20.0000 mg | ORAL_TABLET | Freq: Two times a day (BID) | ORAL | 5 refills | Status: DC

## 2023-06-10 NOTE — Telephone Encounter (Signed)
Famotidine prescription sent

## 2023-06-12 ENCOUNTER — Ambulatory Visit (INDEPENDENT_AMBULATORY_CARE_PROVIDER_SITE_OTHER): Payer: BLUE CROSS/BLUE SHIELD | Admitting: Family Medicine

## 2023-06-12 ENCOUNTER — Encounter: Payer: Self-pay | Admitting: Family Medicine

## 2023-06-12 VITALS — BP 118/70 | HR 70 | Temp 97.8°F | Wt 237.0 lb

## 2023-06-12 DIAGNOSIS — N179 Acute kidney failure, unspecified: Secondary | ICD-10-CM

## 2023-06-12 DIAGNOSIS — R112 Nausea with vomiting, unspecified: Secondary | ICD-10-CM

## 2023-06-12 DIAGNOSIS — R197 Diarrhea, unspecified: Secondary | ICD-10-CM | POA: Diagnosis not present

## 2023-06-12 MED ORDER — ONDANSETRON HCL 4 MG PO TABS
4.0000 mg | ORAL_TABLET | Freq: Once | ORAL | Status: AC
Start: 2023-06-12 — End: 2023-06-12
  Administered 2023-06-12: 4 mg via ORAL

## 2023-06-12 MED ORDER — ONDANSETRON HCL 4 MG PO TABS: 4.0000 mg | ORAL_TABLET | Freq: Three times a day (TID) | ORAL | 0 refills | Status: DC | PRN

## 2023-06-12 NOTE — Progress Notes (Signed)
Pt in the lab appearing drowsy. Alert and oriented. Witnessed PCP, Dr. Janee Morn, in the lab speaking with the patient and explaining risks of operating a motor vehicle if feeling drowsy or lethargic. He offered to activate EMS for safe transport, calling a family member, or shuttle/drive service such as [employer]. Pt declined and states she understands the risks and is ok to drive.

## 2023-06-12 NOTE — Patient Instructions (Signed)
Take Ondansetron 4 mg every 6 hours as needed to control vomiting. Stay well-hydrated by drinking plenty of fluids like water, juice, and soup. Avoid antidiarrheals at this time as they can potentially worsen your symptoms. Monitor for any changes in your symptoms, such as the presence of fever, chills, or worsening nausea and vomiting. Follow if symptoms not improving or visit the emergency department if symptoms worsen or if new concerning symptoms develop.

## 2023-06-12 NOTE — Progress Notes (Signed)
Assessment/Plan:   Problem List Items Addressed This Visit       Digestive   Nausea, vomiting and diarrhea - Primary    Differential diagnosis: Viral gastroenteritis Food poisoning Gastroesophageal reflux disease exacerbation  During lab draw, patient felt a little tired. The patient was reassessed and found to be alert and oriented x 3 with stable vital signs. Administered Zofran with improvement in nausea. The patient tolerated p.o. apple juice and water. Given the concern for possible dehydration from illness, EMS transport to the Emergency Department for further assessment and possible IV fluid resuscitation was offered. Discussed risk associated with dehydration including syncope, renal injury, electrolyte derangement, and if serious enough death. The patient acknowledged the risks associated with dehydration and transportation to the ED. She demonstrated understanding by verbalizing these risks. Despite understanding the risks, the patient declined EMS transport. She was medically assessed as stable through vitals and physical examination. Prescribed Ondansetron 4 mg every 8 hours as needed for nausea and vomiting. Ordered basic blood work to check electrolytes, kidney function, and blood counts. Advised the patient to stay hydrated and monitor fluid intake. Performed a stool study to identify possible infectious agents. Discussed emergency precautions and follow-up if symptoms worsen.      Relevant Medications   ondansetron (ZOFRAN) 4 MG tablet   Other Relevant Orders   Comp Met (CMET)   CBC w/Diff   Gastrointestinal Pathogen Pnl RT, PCR     There are no discontinued medications.  Return if symptoms worsen or fail to improve.    Subjective:   Encounter date: 06/12/2023  Toni [name] is a 47 y.o. female who has Migraine; Chronic neck pain; Esophageal reflux; Class 3 obesity with BMI of 43.80; Anxiety and depression; Vitamin D deficiency; Vitamin B12 deficiency;  Visit for preventive health examination; Vaginal discharge; Hypoglycemia; Encephalopathy acute; Altered mental status; Insomnia secondary to anxiety; Vaginal irritation; Allergic rhinitis; Menorrhagia; History of bariatric surgery; Long term (current) use of opiate analgesic; MDD (major depressive disorder), recurrent episode, moderate (HCC); Pain of right thumb; Pain, elbow joint; Hypotension; Hyponatremia; AKI (acute kidney injury) (HCC); Lumbar radiculopathy; and Nausea, vomiting and diarrhea on their problem list..   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   Chief Complaint: Nausea and vomiting for one week. History of Present Illness: Patient: Patient Symptoms: Nausea and vomiting persisting for a week, accompanied by dizziness. Intake: Trying to consume juice, soup, and crackers. Abdominal Pain: Yes Past Episodes: Similar episode attributed to a virus in the past. Fever/Chills: None reported. Chest Pain/Shortness of Breath: None reported, except for issues related to acid reflux. Last Meal/Drink: Over the past week. Blood in Vomit/Stool: None reported. Weight Loss: Yes, though the amount is unspecified. Potential Exposure: Some sick contacts at work, but no recent camping or unfiltered water consumption. Medications: Pepto (taken for  symptoms).   Past Surgical History:  Procedure Laterality Date   CESAREAN SECTION  2006   ELBOW X-RAY Left 12/07/2019   ENDOMETRIAL BIOPSY     EYE  SURGERY Bilateral    2001   FINGER X-RAY Right 12/07/2019   ROUX-EN-Y GASTRIC BYPASS  01/05/2002   TONSILLECTOMY AND ADENOIDECTOMY     TUBAL LIGATION  2006   tubes in ears both Bilateral     Outpatient Medications Prior to Visit  Medication Sig Dispense Refill   ALPRAZolam (XANAX) 1 MG tablet TAKE 2 TABLETS BY MOUTH TWICE DAILY 120 tablet 0   Calcium 250 MG CAPS Take 1 capsule by mouth 3 (three) times daily.     cyanocobalamin (VITAMIN B12) 1000 MCG/ML injection Inject 1 mL (1,000 mcg total) into the muscle every 2 weeks 10 mL 1   DULoxetine HCl 40 MG CPEP Take 1 capsule (40 mg) by mouth daily. 90 capsule 0   ESTARYLLA 0.25-35 MG-MCG tablet Take 1 tablet by mouth daily.     famotidine (PEPCID) 20 MG tablet Take 1 tablet (20 mg total) by mouth 2 (two) times daily. 60 tablet 5   ferrous gluconate (FERGON) 324 MG tablet Take 1 tablet (324 mg total) by mouth daily with breakfast. 90 tablet 1   gabapentin (NEURONTIN) 300 MG capsule Take 1 capsule (300 mg total) by mouth 2 (two) times daily. 180 capsule 0   glucose blood (FREESTYLE LITE) test strip USE TO CHECK BLOOD SUGAR 3 TIMES A DAY 200 strip 12   lamoTRIgine (LAMICTAL) 150 MG tablet Take 1 tablet (150 mg total) by mouth daily. 90 tablet 0   lisinopril (ZESTRIL) 20 MG tablet TAKE 1 TABLET(20 MG) BY MOUTH DAILY 90 tablet 1   naloxone (NARCAN) nasal spray 4 mg/0.1 mL Place 1 spray into the nose once.     omeprazole (PRILOSEC) 40 MG capsule Take 1 capsule (40 mg total) by mouth 2 (two) times daily. 180 capsule 1   oxyCODONE-acetaminophen (PERCOCET) 10-325 MG tablet Take 1 tablet by mouth every 6 (six) hours as needed for pain.     Ubrogepant (UBRELVY) 50 MG TABS 1-2 tabs po once a day as needed for migraine headache 30 tablet 1   Vitamin D, Ergocalciferol, (DRISDOL) 1.25 MG (50000 UNIT) CAPS  capsule Take 1 capsule (50,000 Units total) by mouth 2 (two) times a week. 24 capsule 1   No facility-administered medications prior to visit.    Family History  Problem Relation Age of Onset   Arthritis Mother    Hyperlipidemia Mother    Heart disease Mother    Diabetes Mother    Asthma Mother    Aneurysm Father        deceased   Lung cancer Maternal Aunt        smoker   Colon cancer Maternal Grandmother    Lung cancer Maternal Aunt        smoker    Social History   Socioeconomic History   Marital status: Legally Separated    Spouse name: Not on file   Number of children: Not on file   Years of education: Not on file   Highest education level: Associate degree: occupational, Scientist, product/process development, or vocational program  Occupational History   Not on file  Tobacco Use   Smoking status: Never   Smokeless tobacco: Never  Vaping Use   Vaping status: Never Used  Substance and Sexual Activity   Alcohol use: Yes    Alcohol/week: 0.0 standard drinks of alcohol    Comment: occasional    Drug use: No   Sexual activity: Not on file  Other Topics Concern   Not on file  Social History Narrative  Married.   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   No T/A/Ds.   Social Determinants of Health   Financial Resource Strain: High Risk (12/07/2021)   Overall Financial Resource Strain (CARDIA)    Difficulty of Paying Living Expenses: Hard  Food Insecurity: Food Insecurity Present (12/07/2021)   Hunger Vital Sign    Worried About Running Out of Food in the Last Year: Sometimes true    Ran Out of Food in the Last Year: Sometimes true  Transportation Needs: Unknown (12/07/2021)   PRAPARE - Administrator, Civil Service (Medical): Not on file    Lack of Transportation (Non-Medical): No  Physical Activity: Insufficiently Active (12/07/2021)   Exercise Vital Sign    Days of Exercise per Week: 3 days    Minutes of Exercise per Session: 30 min  Stress: Stress Concern Present  (12/07/2021)   [employer] of Occupational Health - Occupational Stress Questionnaire    Feeling of Stress : Very much  Social Connections: Socially Isolated (12/07/2021)   Social Connection and Isolation Panel [NHANES]    Frequency of Communication with Friends and Family: Three times a week    Frequency of Social Gatherings with Friends and Family: Three times a week    Attends Religious Services: Never    Active Member of Clubs or Organizations: No    Attends Engineer, structural: Not on file    Marital Status: Divorced  Catering manager Violence: Not on file                                                                                                  Objective:  Physical Exam: BP 118/70 (BP Location: Left Arm, Patient Position: Sitting, Cuff Size: Large)   Pulse 70   Temp 97.8 F (36.6 C) (Temporal)   Wt 237 lb (107.5 kg)   LMP 06/12/2023   SpO2 99%   BMI 38.84 kg/m      Physical Exam Constitutional:      General: She is not in acute distress.    Appearance: She is not toxic-appearing.  HENT:     Head: Normocephalic and atraumatic.     Nose: Nose normal. No congestion.     Mouth/Throat:     Lips: Pink.     Mouth: Mucous membranes are moist.  Eyes:     General: No scleral icterus.    Extraocular Movements: Extraocular movements intact.  Cardiovascular:     Rate and Rhythm: Normal rate and regular rhythm.     Pulses: Normal pulses.     Heart sounds: Normal heart sounds.  Pulmonary:     Effort: Pulmonary effort is normal. No respiratory distress.     Breath sounds: Normal breath sounds.  Abdominal:     General: Abdomen is flat. Bowel sounds are normal.     Palpations: Abdomen is soft.  Musculoskeletal:        General: Normal range of motion.  Lymphadenopathy:     Cervical: No cervical adenopathy.  Skin:    General: Skin is warm and dry.     Findings: No  rash.  Neurological:     General: No focal deficit present.     Mental Status: She is  alert and oriented to person, place, and time. Mental status is at baseline.  Psychiatric:        Mood and Affect: Mood normal.        Behavior: Behavior normal. Behavior is cooperative.        Thought Content: Thought content normal.        Judgment: Judgment normal.     No results found.  Recent Results (from the past 2160 hour(s))  Drug Monitoring Panel [phone] , Urine     Status: Abnormal   Collection Time: 05/16/23  2:13 PM  Result Value Ref Range   [facility] Summary      Comment:     Prescribed            Prescribed            Not Prescribed     Consistent            Inconsistent          Inconsistent     ----------            ------------          --------------                                                 Alphahydroxyalprazolam                                                 Norhydrocodone                                                 Noroxycodone                                                 Oxazepam                                                 Oxycodone                                                 Oxymorphone .    Amphetamines NEGATIVE <500 ng/mL   Barbiturates NEGATIVE <300 ng/mL   Benzodiazepines POSITIVE (A) <100 ng/mL   Alphahydroxyalprazolam 167 (H) <25 ng/mL   medMATCH aOH alprazolam INCONSISTENT (A)    Alphahydroxymidazolam NEGATIVE <50 ng/mL   Alphahydroxytriazolam NEGATIVE <50 ng/mL   Aminoclonazepam NEGATIVE <25 ng/mL   Hydroxyethylflurazepam NEGATIVE <50 ng/mL   Lorazepam NEGATIVE <50 ng/mL   Nordiazepam NEGATIVE <50 ng/mL   Oxazepam 257 (H) <50 ng/mL   medMATCH Oxazepam INCONSISTENT (A)  Temazepam NEGATIVE <50 ng/mL   Benzodiazepines Comments      Comment: See Benzodiazepines Notes, LDT Notes   Cocaine Metabolite NEGATIVE <150 ng/mL   Opiates POSITIVE (A) <100 ng/mL   Codeine NEGATIVE <50 ng/mL   Hydrocodone NEGATIVE <50 ng/mL   Hydromorphone NEGATIVE <50 ng/mL   Morphine NEGATIVE <50 ng/mL   Norhydrocodone 63 (H) <50 ng/mL    medMATCH Norhydrocodone INCONSISTENT (A)    Opiates Comments      Comment: See Opiates Notes, LDT Notes   Oxycodone POSITIVE (A) <100 ng/mL   Noroxycodone >10,000 (H) <50 ng/mL   medMATCH Noroxycodone INCONSISTENT (A)    Oxycodone >10,000 (H) <50 ng/mL   medMATCH Oxycodone INCONSISTENT (A)    Oxymorphone 1,817 (H) <50 ng/mL   medMATCH Oxymorphone INCONSISTENT (A)    Oxycodone Comments      Comment: See Oxycodone Notes, LDT Notes   Desmethyltramadol NEGATIVE <100 ng/mL   Tramadol NEGATIVE <100 ng/mL   Tramadol Comments      Comment: See LDT Notes  Vitamin D (25 hydroxy)     Status: None   Collection Time: 05/16/23  2:13 PM  Result Value Ref Range   VITD 31.65 30.00 - 100.00 ng/mL  B12     Status: Abnormal   Collection Time: 05/16/23  2:13 PM  Result Value Ref Range   Vitamin B-12 1,406 (H) 211 - 911 pg/mL  IBC + Ferritin     Status: Abnormal   Collection Time: 05/16/23  2:13 PM  Result Value Ref Range   Iron 42 42 - 145 ug/dL   Transferrin 161.0 960.4 - 360.0 mg/dL   Saturation Ratios 54.0 (L) 20.0 - 50.0 %   Ferritin 34.7 10.0 - 291.0 ng/mL   TIBC 371.0 250.0 - 450.0 mcg/dL  CBC with Differential/Platelet     Status: Abnormal   Collection Time: 05/16/23  2:13 PM  Result Value Ref Range   WBC 5.5 4.0 - 10.5 K/uL   RBC 4.30 3.87 - 5.11 Mil/uL   Hemoglobin 12.2 12.0 - 15.0 g/dL   HCT 98.1 19.1 - 47.8 %   MCV 89.4 78.0 - 100.0 fl   MCHC 31.6 30.0 - 36.0 g/dL   RDW 29.5 (H) 62.1 - 30.8 %   Platelets 344.0 150.0 - 400.0 K/uL   Neutrophils Relative % 33.4 (L) 43.0 - 77.0 %   Lymphocytes Relative 55.3 Repeated and verified X2. (H) 12.0 - 46.0 %   Monocytes Relative 8.8 3.0 - 12.0 %   Eosinophils Relative 2.0 0.0 - 5.0 %   Basophils Relative 0.5 0.0 - 3.0 %   Neutro Abs 1.8 1.4 - 7.7 K/uL   Lymphs Abs 3.1 0.7 - 4.0 K/uL   Monocytes Absolute 0.5 0.1 - 1.0 K/uL   Eosinophils Absolute 0.1 0.0 - 0.7 K/uL   Basophils Absolute 0.0 0.0 - 0.1 K/uL  TSH     Status: None    Collection Time: 05/16/23  2:13 PM  Result Value Ref Range   TSH 0.35 0.35 - 5.50 uIU/mL  Lipid panel     Status: None   Collection Time: 05/16/23  2:13 PM  Result Value Ref Range   Cholesterol 182 0 - 200 mg/dL    Comment: ATP III Classification       Desirable:  < 200 mg/dL               Borderline High:  200 - 239 mg/dL          High:  > =  240 mg/dL   Triglycerides 629.5 0.0 - 149.0 mg/dL    Comment: Normal:  <284 mg/dLBorderline High:  150 - 199 mg/dL   HDL 13.24 >40.10 mg/dL   VLDL 27.2 0.0 - 53.6 mg/dL   LDL Cholesterol 78 0 - 99 mg/dL   Total CHOL/HDL Ratio 2     Comment:                Men          Women1/2 Average Risk     3.4          3.3Average Risk          5.0          4.42X Average Risk          9.6          7.13X Average Risk          15.0          11.0                       NonHDL 103.92     Comment: NOTE:  Non-HDL goal should be 30 mg/dL higher than patient's LDL goal (i.e. LDL goal of < 70 mg/dL, would have non-HDL goal of < 100 mg/dL)  Comprehensive metabolic panel     Status: None   Collection Time: 05/16/23  2:13 PM  Result Value Ref Range   Sodium 136 135 - 145 mEq/L   Potassium 4.1 3.5 - 5.1 mEq/L   Chloride 103 96 - 112 mEq/L   CO2 24 19 - 32 mEq/L   Glucose, Bld 87 70 - 99 mg/dL   BUN 11 6 - 23 mg/dL   Creatinine, Ser 6.44 0.40 - 1.20 mg/dL   Total Bilirubin 0.3 0.2 - 1.2 mg/dL   Alkaline Phosphatase 47 39 - 117 U/L   AST 12 0 - 37 U/L   ALT 7 0 - 35 U/L   Total Protein 6.1 6.0 - 8.3 g/dL   Albumin 3.8 3.5 - 5.2 g/dL   GFR 03.47 >42.59 mL/min    Comment: Calculated using the CKD-EPI Creatinine Equation (2021)   Calcium 8.8 8.4 - 10.5 mg/dL  DM TEMPLATE     Status: None   Collection Time: 05/16/23  2:13 PM  Result Value Ref Range   Notes and Comments      Comment: This drug testing is for medical treatment only. Analysis was performed as non-forensic testing and these results should be used only by healthcare providers to render diagnosis or  treatment, or to monitor progress of medical conditions. . Benzodiazepines Notes: aOH Alprazolam detected is consistent with the use of  the drug Alprazolam. . Oxazepam detected is consistent with the use of the  drug Oxazepam. . Oxazepam can be a prescribed drug and is also a  metabolite of Diazepam, Chlordiazepoxide, Clorazepate  and Temazepam. . Opiates Notes: Norhydrocodone detected is consistent with the use of  the drug Hydrocodone. . The metabolite Hydromorphone is not present at or above  the cutoff. . Oxycodone Notes: Oxycodone, Noroxycodone, Oxymorphone detected is  consistent with the use of the drug Oxycodone. . Oxymorphone detected is consistent with the use of the  drug Oxymorphone. . Oxymorphone can be a prescribed drug and is also a  metabolite of Oxycodone. Marland Kitchen  LDT Notes: Confirmation tests were developed and their analytical  performance characteristics have been determined by  [employer]. It has not been cleared or approved  by the FDA. This assay has been validated pursuant to  the CLIA regulations and is used for clinical purposes. . . medMATCH(R) enables providers to identify if drug use is consistent or inconsistent with a corresponding prescribed medication(s) list. . . Healthcare Providers needing Interpretation assistance,  please contact us at [phone] ([phone])  M-F, 8am to 10pm EST   CBC with Differential/Platelet     Status: Abnormal   Collection Time: 05/29/23  3:15 PM  Result Value Ref Range   WBC 9.8 4.0 - 10.5 K/uL   RBC 4.02 3.87 - 5.11 Mil/uL   Hemoglobin 11.9 (L) 12.0 - 15.0 g/dL   HCT 16.1 09.6 - 04.5 %   MCV 91.9 78.0 - 100.0 fl   MCHC 32.1 30.0 - 36.0 g/dL   RDW 40.9 (H) 81.1 - 91.4 %   Platelets 307.0 150.0 - 400.0 K/uL   Neutrophils Relative % 63.3 43.0 - 77.0 %   Lymphocytes Relative 29.4 12.0 - 46.0 %   Monocytes Relative 6.3 3.0 - 12.0 %   Eosinophils Relative 0.5 0.0 - 5.0 %   Basophils  Relative 0.5 0.0 - 3.0 %   Neutro Abs 6.2 1.4 - 7.7 K/uL   Lymphs Abs 2.9 0.7 - 4.0 K/uL   Monocytes Absolute 0.6 0.1 - 1.0 K/uL   Eosinophils Absolute 0.1 0.0 - 0.7 K/uL   Basophils Absolute 0.1 0.0 - 0.1 K/uL        Garner Nash, MD, MS

## 2023-06-12 NOTE — Assessment & Plan Note (Signed)
Differential diagnosis: Viral gastroenteritis Food poisoning Gastroesophageal reflux disease exacerbation  During lab draw, patient felt a little tired. The patient was reassessed and found to be alert and oriented x 3 with stable vital signs. Administered Zofran with improvement in nausea. The patient tolerated p.o. apple juice and water. Given the concern for possible dehydration from illness, EMS transport to the Emergency Department for further assessment and possible IV fluid resuscitation was offered. Discussed risk associated with dehydration including syncope, renal injury, electrolyte derangement, and if serious enough death. The patient acknowledged the risks associated with dehydration and transportation to the ED. She demonstrated understanding by verbalizing these risks. Despite understanding the risks, the patient declined EMS transport. She was medically assessed as stable through vitals and physical examination. Prescribed Ondansetron 4 mg every 8 hours as needed for nausea and vomiting. Ordered basic blood work to check electrolytes, kidney function, and blood counts. Advised the patient to stay hydrated and monitor fluid intake. Performed a stool study to identify possible infectious agents. Discussed emergency precautions and follow-up if symptoms worsen.

## 2023-06-13 LAB — CBC WITH DIFFERENTIAL/PLATELET
Basophils Absolute: 0 10*3/uL (ref 0.0–0.1)
Basophils Relative: 0.5 % (ref 0.0–3.0)
Eosinophils Absolute: 0.2 10*3/uL (ref 0.0–0.7)
Eosinophils Relative: 2.7 % (ref 0.0–5.0)
HCT: 41.5 % (ref 36.0–46.0)
Hemoglobin: 13.3 g/dL (ref 12.0–15.0)
Lymphocytes Relative: 34.8 % (ref 12.0–46.0)
Lymphs Abs: 2.8 10*3/uL (ref 0.7–4.0)
MCHC: 32 g/dL (ref 30.0–36.0)
MCV: 93.1 fl (ref 78.0–100.0)
Monocytes Absolute: 0.5 10*3/uL (ref 0.1–1.0)
Monocytes Relative: 6.6 % (ref 3.0–12.0)
Neutro Abs: 4.5 10*3/uL (ref 1.4–7.7)
Neutrophils Relative %: 55.4 % (ref 43.0–77.0)
Platelets: 358 10*3/uL (ref 150.0–400.0)
RBC: 4.46 Mil/uL (ref 3.87–5.11)
RDW: 21.1 % — ABNORMAL HIGH (ref 11.5–15.5)
WBC: 8 10*3/uL (ref 4.0–10.5)

## 2023-06-13 LAB — COMPREHENSIVE METABOLIC PANEL
ALT: 10 U/L (ref 0–35)
AST: 16 U/L (ref 0–37)
Albumin: 4.1 g/dL (ref 3.5–5.2)
Alkaline Phosphatase: 59 U/L (ref 39–117)
BUN: 17 mg/dL (ref 6–23)
CO2: 24 meq/L (ref 19–32)
Calcium: 9.6 mg/dL (ref 8.4–10.5)
Chloride: 104 meq/L (ref 96–112)
Creatinine, Ser: 1.23 mg/dL — ABNORMAL HIGH (ref 0.40–1.20)
GFR: 52.28 mL/min — ABNORMAL LOW (ref >60.00)
Glucose, Bld: 97 mg/dL (ref 70–99)
Potassium: 4 meq/L (ref 3.5–5.1)
Sodium: 138 meq/L (ref 135–145)
Total Bilirubin: 0.3 mg/dL (ref 0.2–1.2)
Total Protein: 7.2 g/dL (ref 6.0–8.3)

## 2023-06-14 NOTE — Addendum Note (Signed)
Addended by: Fanny Bien B on: 06/14/2023 01:36 PM   Modules accepted: Orders

## 2023-06-20 ENCOUNTER — Telehealth: Payer: BLUE CROSS/BLUE SHIELD | Admitting: Physician Assistant

## 2023-06-20 DIAGNOSIS — R1110 Vomiting, unspecified: Secondary | ICD-10-CM

## 2023-06-20 MED ORDER — ONDANSETRON 4 MG PO TBDP
4.0000 mg | ORAL_TABLET | Freq: Three times a day (TID) | ORAL | 0 refills | Status: DC | PRN
Start: 2023-06-20 — End: 2024-03-05

## 2023-06-20 NOTE — Progress Notes (Signed)

## 2023-06-26 ENCOUNTER — Encounter: Payer: Self-pay | Admitting: Family Medicine

## 2023-06-27 MED ORDER — LEVOCETIRIZINE DIHYDROCHLORIDE 5 MG PO TABS: 5.0000 mg | ORAL_TABLET | Freq: Every evening | ORAL | 2 refills | Status: DC

## 2023-06-27 NOTE — Telephone Encounter (Signed)
Ok, xyzal sent

## 2023-07-03 ENCOUNTER — Other Ambulatory Visit: Payer: Self-pay | Admitting: Family Medicine

## 2023-07-03 ENCOUNTER — Other Ambulatory Visit (HOSPITAL_COMMUNITY): Payer: Self-pay | Admitting: Psychiatry

## 2023-07-03 DIAGNOSIS — F419 Anxiety disorder, unspecified: Secondary | ICD-10-CM

## 2023-07-03 DIAGNOSIS — F331 Major depressive disorder, recurrent, moderate: Secondary | ICD-10-CM

## 2023-07-03 DIAGNOSIS — F429 Obsessive-compulsive disorder, unspecified: Secondary | ICD-10-CM

## 2023-07-04 MED ORDER — ALPRAZOLAM 1 MG PO TABS: ORAL_TABLET | ORAL | 0 refills | Status: DC

## 2023-07-04 NOTE — Telephone Encounter (Signed)
No further action needed.

## 2023-07-07 NOTE — Telephone Encounter (Signed)
Ok to fill today

## 2023-07-08 NOTE — Telephone Encounter (Signed)
Okay to refill today.

## 2023-07-09 ENCOUNTER — Telehealth (HOSPITAL_COMMUNITY): Payer: BLUE CROSS/BLUE SHIELD | Admitting: Psychiatry

## 2023-07-17 ENCOUNTER — Other Ambulatory Visit: Payer: Self-pay | Admitting: Family Medicine

## 2023-07-17 ENCOUNTER — Other Ambulatory Visit (HOSPITAL_COMMUNITY): Payer: Self-pay | Admitting: Psychiatry

## 2023-07-17 DIAGNOSIS — F331 Major depressive disorder, recurrent, moderate: Secondary | ICD-10-CM

## 2023-07-17 DIAGNOSIS — F429 Obsessive-compulsive disorder, unspecified: Secondary | ICD-10-CM

## 2023-07-17 DIAGNOSIS — F419 Anxiety disorder, unspecified: Secondary | ICD-10-CM

## 2023-07-17 NOTE — Telephone Encounter (Signed)
Requesting: ALPRAZolam (XANAX) 1 MG tablet  Contract: 05/16/23 UDS: 05/16/23 Last Visit: 06/12/23 Next Visit: advised to f/u 6 months Last Refill: 07/04/23 (120,0)  Please Advise. Med pending

## 2023-07-17 NOTE — Telephone Encounter (Signed)
Prescription Request  07/17/2023  LOV: 05/16/2023  What is the name of the medication or equipment? ALPRAZolam (XANAX) 1 MG tablet I informed the patient she has to call every month for this medication and she began to get upset and said Dr. Milinda Cave sent in few months at a time.  Have you contacted your pharmacy to request a refill? Yes   Which pharmacy would you like this sent to?  [id number] [place], [place] - [address] AT [facility] OF [address] [place] [address] Phone: [phone] Fax: [phone]  [place] [facility] [address] [place] [address] Phone: [phone] Fax: [phone]  [facility] [place], [place] - [address] [place] [address] Phone: [phone] Fax: [phone]  [facility] [id number] - [place], [place] - [address] [place] [address] Phone: [phone] Fax: [phone]    Patient notified that their request is being sent to the clinical staff for review and that they should receive a response within 2 business days.   Please advise at Mobile [phone] (mobile)

## 2023-07-18 MED ORDER — ALPRAZOLAM 1 MG PO TABS: ORAL_TABLET | ORAL | 0 refills | Status: DC

## 2023-07-18 NOTE — Addendum Note (Signed)
Addended by: Jeoffrey Massed on: 07/18/2023 11:34 AM   Modules accepted: Orders

## 2023-07-18 NOTE — Telephone Encounter (Signed)
Pt confirmed pharmacy.

## 2023-07-18 NOTE — Telephone Encounter (Signed)
Joydan, A prescription for 90-day supply will be sent to pharmacy. Please verify which pharmacy. Your next follow-up should be 6 months from your most recent visit, which was 05/16/2023. --PM

## 2023-07-28 ENCOUNTER — Telehealth: Payer: Self-pay

## 2023-07-28 NOTE — Telephone Encounter (Signed)
PA denied, "This health benefit plan does not cover the following services, supplies, drugs or charges: Any drug that is therapeutically equivalent to an over-the-counter drug where the over-the-counter products contain the same active ingredients as the prescription product at the same, or similar, strengths. -OR- Drugs that are Purchased over-the-counter, unless specifically listed as a covered drug in the formulary and a written prescription is provided"

## 2023-07-28 NOTE — Telephone Encounter (Signed)
Drinda Butts (Key: [id number])  Status: Sent to Plan today Drug: Omeprazole 40MG  dr capsules Form: [employer] account executive Form

## 2023-07-29 ENCOUNTER — Encounter: Payer: Self-pay | Admitting: Family Medicine

## 2023-08-13 ENCOUNTER — Encounter (HOSPITAL_COMMUNITY): Payer: Self-pay | Admitting: Psychiatry

## 2023-08-13 ENCOUNTER — Telehealth (HOSPITAL_BASED_OUTPATIENT_CLINIC_OR_DEPARTMENT_OTHER): Payer: BLUE CROSS/BLUE SHIELD | Admitting: Psychiatry

## 2023-08-13 VITALS — Wt 225.0 lb

## 2023-08-13 DIAGNOSIS — F419 Anxiety disorder, unspecified: Secondary | ICD-10-CM | POA: Diagnosis not present

## 2023-08-13 DIAGNOSIS — F331 Major depressive disorder, recurrent, moderate: Secondary | ICD-10-CM

## 2023-08-13 DIAGNOSIS — F429 Obsessive-compulsive disorder, unspecified: Secondary | ICD-10-CM

## 2023-08-13 MED ORDER — DULOXETINE HCL 40 MG PO CPEP: 40.0000 mg | ORAL_CAPSULE | Freq: Every day | ORAL | 0 refills | Status: DC

## 2023-08-13 MED ORDER — GABAPENTIN 300 MG PO CAPS: 300.0000 mg | ORAL_CAPSULE | Freq: Two times a day (BID) | ORAL | 0 refills | Status: DC

## 2023-08-13 MED ORDER — LAMOTRIGINE 150 MG PO TABS: 150.0000 mg | ORAL_TABLET | Freq: Every day | ORAL | 0 refills | Status: DC

## 2023-08-13 NOTE — Progress Notes (Signed)
Toni [name]   Patient Location: In Car Provider Location: Home Office  I connect with patient by video and verified that I am speaking with correct person by using two identifiers. I discussed the limitations of evaluation and management by telemedicine and the availability of in person appointments. I also discussed with the patient that there may be a patient responsible charge related to this service. The patient expressed understanding and agreed to proceed.  Toni [name] [id number] 47 y.o.  08/13/2023 1:06 PM  History of Present Illness:  Patient is evaluated by video session.  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  She was not happy with her previous job at [employer].  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  Overall she feels things are going very well.  She had some grief and distress after she lost her mother in July but slowly and gradually things are much better.  She is living at her mother's house.  Recently her cousin moved in but did not realize that because it is moving with the girlfriend.  Patient told the girlfriend does not work and she is not sure how long they will stay with the patient.  She reported sleep is good other than sometimes wake up in the middle of the night to the bathroom.  Patient reported older son is living in [place] and younger son living with his father which is close to his school.  Patient told the undersigned he is doing very well in the school.  Patient denies any irritability, anger, mania.  She has some time obsessive thoughts but they are not interfering in her daily activities.  She did therapy session with Denyse Amass but due to her busy schedule has been not able to back to therapy.  She is trying to lose weight and she had lost 15 pounds since  the last visit.  Patient has no rash, itching, tremors or shakes.  She is compliant with Cymbalta, Lamictal and gabapentin.  She is also prescribed gabapentin from her pain medication doctor ramose.  Patient denies any panic attack.  Past Psychiatric History: H/O depression after got married.  Tried Zoloft for years but stopped after seizure. PCP tried trazodone, Ambien, Doxepin and Seroquel but  did not work.  We tried higher dose of Cymbalta but developed side effects.  H/O irritability, poor impulse control with excessive buying and shopping but no history of mania, psychosis, suicidal attempt or inpatient treatment.     Outpatient Encounter Medications as of 08/13/2023  Medication Sig   ALPRAZolam (XANAX) 1 MG tablet TAKE 2 TABLETS BY MOUTH TWICE DAILY   Calcium 250 MG CAPS Take 1 capsule by mouth 3 (three) times daily.   cyanocobalamin (VITAMIN B12) 1000 MCG/ML injection Inject 1 mL (1,000 mcg total) into the muscle every 2 weeks   DULoxetine HCl 40 MG CPEP Take 1 capsule (40 mg) by mouth daily.   ESTARYLLA 0.25-35 MG-MCG tablet Take 1 tablet by mouth daily.   famotidine (PEPCID) 20 MG tablet Take 1 tablet (20 mg total) by mouth 2 (two) times daily.   ferrous gluconate (FERGON) 324 MG tablet Take 1 tablet (324 mg total) by mouth daily with breakfast.   gabapentin (NEURONTIN) 300 MG capsule Take 1 capsule (300 mg total) by mouth 2 (two) times  daily.   glucose blood (FREESTYLE LITE) test strip USE TO CHECK BLOOD SUGAR 3 TIMES A DAY   lamoTRIgine (LAMICTAL) 150 MG tablet Take 1 tablet (150 mg total) by mouth daily.   levocetirizine (XYZAL) 5 MG tablet Take 1 tablet (5 mg total) by mouth every evening.   lisinopril (ZESTRIL) 20 MG tablet TAKE 1 TABLET(20 MG) BY MOUTH DAILY   naloxone (NARCAN) nasal spray 4 mg/0.1 mL Place 1 spray into the nose once.   omeprazole (PRILOSEC) 40 MG capsule Take 1 capsule (40 mg total) by mouth 2 (two) times daily.   ondansetron (ZOFRAN-ODT) 4 MG disintegrating  tablet Take 1 tablet (4 mg total) by mouth every 8 (eight) hours as needed.   oxyCODONE-acetaminophen (PERCOCET) 10-325 MG tablet Take 1 tablet by mouth every 6 (six) hours as needed for pain.   Ubrogepant (UBRELVY) 50 MG TABS 1-2 tabs po once a day as needed for migraine headache   Vitamin D, Ergocalciferol, (DRISDOL) 1.25 MG (50000 UNIT) CAPS capsule Take 1 capsule (50,000 Units total) by mouth 2 (two) times a week.   No facility-administered encounter medications on file as of 08/13/2023.    Recent Results (from the past 2160 hour(s))  Drug Monitoring Panel [phone] , Urine     Status: Abnormal   Collection Time: 05/16/23  2:13 PM  Result Value Ref Range   [facility] Summary      Comment:     Prescribed            Prescribed            Not Prescribed     Consistent            Inconsistent          Inconsistent     ----------            ------------          --------------                                                 Alphahydroxyalprazolam                                                 Norhydrocodone                                                 Noroxycodone                                                 Oxazepam                                                 Oxycodone  Oxymorphone .    Amphetamines NEGATIVE <500 ng/mL   Barbiturates NEGATIVE <300 ng/mL   Benzodiazepines POSITIVE (A) <100 ng/mL   Alphahydroxyalprazolam 167 (H) <25 ng/mL   medMATCH aOH alprazolam INCONSISTENT (A)    Alphahydroxymidazolam NEGATIVE <50 ng/mL   Alphahydroxytriazolam NEGATIVE <50 ng/mL   Aminoclonazepam NEGATIVE <25 ng/mL   Hydroxyethylflurazepam NEGATIVE <50 ng/mL   Lorazepam NEGATIVE <50 ng/mL   Nordiazepam NEGATIVE <50 ng/mL   Oxazepam 257 (H) <50 ng/mL   medMATCH Oxazepam INCONSISTENT (A)    Temazepam NEGATIVE <50 ng/mL   Benzodiazepines Comments      Comment: See Benzodiazepines Notes, LDT Notes   Cocaine Metabolite NEGATIVE <150 ng/mL    Opiates POSITIVE (A) <100 ng/mL   Codeine NEGATIVE <50 ng/mL   Hydrocodone NEGATIVE <50 ng/mL   Hydromorphone NEGATIVE <50 ng/mL   Morphine NEGATIVE <50 ng/mL   Norhydrocodone 63 (H) <50 ng/mL   medMATCH Norhydrocodone INCONSISTENT (A)    Opiates Comments      Comment: See Opiates Notes, LDT Notes   Oxycodone POSITIVE (A) <100 ng/mL   Noroxycodone >10,000 (H) <50 ng/mL   medMATCH Noroxycodone INCONSISTENT (A)    Oxycodone >10,000 (H) <50 ng/mL   medMATCH Oxycodone INCONSISTENT (A)    Oxymorphone 1,817 (H) <50 ng/mL   medMATCH Oxymorphone INCONSISTENT (A)    Oxycodone Comments      Comment: See Oxycodone Notes, LDT Notes   Desmethyltramadol NEGATIVE <100 ng/mL   Tramadol NEGATIVE <100 ng/mL   Tramadol Comments      Comment: See LDT Notes  Vitamin D (25 hydroxy)     Status: None   Collection Time: 05/16/23  2:13 PM  Result Value Ref Range   VITD 31.65 30.00 - 100.00 ng/mL  B12     Status: Abnormal   Collection Time: 05/16/23  2:13 PM  Result Value Ref Range   Vitamin B-12 1,406 (H) 211 - 911 pg/mL  IBC + Ferritin     Status: Abnormal   Collection Time: 05/16/23  2:13 PM  Result Value Ref Range   Iron 42 42 - 145 ug/dL   Transferrin 191.4 782.9 - 360.0 mg/dL   Saturation Ratios 56.2 (L) 20.0 - 50.0 %   Ferritin 34.7 10.0 - 291.0 ng/mL   TIBC 371.0 250.0 - 450.0 mcg/dL  CBC with Differential/Platelet     Status: Abnormal   Collection Time: 05/16/23  2:13 PM  Result Value Ref Range   WBC 5.5 4.0 - 10.5 K/uL   RBC 4.30 3.87 - 5.11 Mil/uL   Hemoglobin 12.2 12.0 - 15.0 g/dL   HCT 13.0 86.5 - 78.4 %   MCV 89.4 78.0 - 100.0 fl   MCHC 31.6 30.0 - 36.0 g/dL   RDW 69.6 (H) 29.5 - 28.4 %   Platelets 344.0 150.0 - 400.0 K/uL   Neutrophils Relative % 33.4 (L) 43.0 - 77.0 %   Lymphocytes Relative 55.3 Repeated and verified X2. (H) 12.0 - 46.0 %   Monocytes Relative 8.8 3.0 - 12.0 %   Eosinophils Relative 2.0 0.0 - 5.0 %   Basophils Relative 0.5 0.0 - 3.0 %   Neutro Abs 1.8 1.4 -  7.7 K/uL   Lymphs Abs 3.1 0.7 - 4.0 K/uL   Monocytes Absolute 0.5 0.1 - 1.0 K/uL   Eosinophils Absolute 0.1 0.0 - 0.7 K/uL   Basophils Absolute 0.0 0.0 - 0.1 K/uL  TSH     Status: None   Collection Time: 05/16/23  2:13 PM  Result Value Ref Range  TSH 0.35 0.35 - 5.50 uIU/mL  Lipid panel     Status: None   Collection Time: 05/16/23  2:13 PM  Result Value Ref Range   Cholesterol 182 0 - 200 mg/dL    Comment: ATP III Classification       Desirable:  < 200 mg/dL               Borderline High:  200 - 239 mg/dL          High:  > = 161 mg/dL   Triglycerides 096.0 0.0 - 149.0 mg/dL    Comment: Normal:  <454 mg/dLBorderline High:  150 - 199 mg/dL   HDL 09.81 >19.14 mg/dL   VLDL 78.2 0.0 - 95.6 mg/dL   LDL Cholesterol 78 0 - 99 mg/dL   Total CHOL/HDL Ratio 2     Comment:                Men          Women1/2 Average Risk     3.4          3.3Average Risk          5.0          4.42X Average Risk          9.6          7.13X Average Risk          15.0          11.0                       NonHDL 103.92     Comment: [name]:  Non-HDL goal should be 30 mg/dL higher than patient's LDL goal (i.e. LDL goal of < 70 mg/dL, would have non-HDL goal of < 100 mg/dL)  Comprehensive metabolic panel     Status: None   Collection Time: 05/16/23  2:13 PM  Result Value Ref Range   Sodium 136 135 - 145 mEq/L   Potassium 4.1 3.5 - 5.1 mEq/L   Chloride 103 96 - 112 mEq/L   CO2 24 19 - 32 mEq/L   Glucose, Bld 87 70 - 99 mg/dL   BUN 11 6 - 23 mg/dL   Creatinine, Ser 2.13 0.40 - 1.20 mg/dL   Total Bilirubin 0.3 0.2 - 1.2 mg/dL   Alkaline Phosphatase 47 39 - 117 U/L   AST 12 0 - 37 U/L   ALT 7 0 - 35 U/L   Total Protein 6.1 6.0 - 8.3 g/dL   Albumin 3.8 3.5 - 5.2 g/dL   GFR 08.65 >78.46 mL/min    Comment: Calculated using the CKD-EPI Creatinine Equation (2021)   Calcium 8.8 8.4 - 10.5 mg/dL  DM TEMPLATE     Status: None   Collection Time: 05/16/23  2:13 PM  Result Value Ref Range   Notes and Comments      Comment:  This drug testing is for medical treatment only. Analysis was performed as non-forensic testing and these results should be used only by healthcare providers to render diagnosis or treatment, or to monitor progress of medical conditions. . Benzodiazepines Notes: aOH Alprazolam detected is consistent with the use of  the drug Alprazolam. . Oxazepam detected is consistent with the use of the  drug Oxazepam. . Oxazepam can be a prescribed drug and is also a  metabolite of Diazepam, Chlordiazepoxide, Clorazepate  and Temazepam. . Opiates Notes: Norhydrocodone detected is consistent with the use of  the  drug Hydrocodone. . The metabolite Hydromorphone is not present at or above  the cutoff. . Oxycodone Notes: Oxycodone, Noroxycodone, Oxymorphone detected is  consistent with the use of the drug Oxycodone. . Oxymorphone detected is consistent with the use of the  drug Oxymorphone. . Oxymorphone can be a prescribed drug and is also a  metabolite of Oxycodone. Marland Kitchen  LDT Notes: Confirmation tests were developed and their analytical  performance characteristics have been determined by  [employer]. It has not been cleared or approved  by the FDA. This assay has been validated pursuant to  the CLIA regulations and is used for clinical purposes. . . medMATCH(R) enables providers to identify if drug use is consistent or inconsistent with a corresponding prescribed medication(s) list. . . Healthcare Providers needing Interpretation assistance,  please contact us at [phone] ([phone])  M-F, 8am to 10pm EST   CBC with Differential/Platelet     Status: Abnormal   Collection Time: 05/29/23  3:15 PM  Result Value Ref Range   WBC 9.8 4.0 - 10.5 K/uL   RBC 4.02 3.87 - 5.11 Mil/uL   Hemoglobin 11.9 (L) 12.0 - 15.0 g/dL   HCT 13.0 86.5 - 78.4 %   MCV 91.9 78.0 - 100.0 fl   MCHC 32.1 30.0 - 36.0 g/dL   RDW 69.6 (H) 29.5 - 28.4 %   Platelets 307.0 150.0 - 400.0 K/uL    Neutrophils Relative % 63.3 43.0 - 77.0 %   Lymphocytes Relative 29.4 12.0 - 46.0 %   Monocytes Relative 6.3 3.0 - 12.0 %   Eosinophils Relative 0.5 0.0 - 5.0 %   Basophils Relative 0.5 0.0 - 3.0 %   Neutro Abs 6.2 1.4 - 7.7 K/uL   Lymphs Abs 2.9 0.7 - 4.0 K/uL   Monocytes Absolute 0.6 0.1 - 1.0 K/uL   Eosinophils Absolute 0.1 0.0 - 0.7 K/uL   Basophils Absolute 0.1 0.0 - 0.1 K/uL  Comp Met (CMET)     Status: Abnormal   Collection Time: 06/12/23  4:27 PM  Result Value Ref Range   Sodium 138 135 - 145 mEq/L   Potassium 4.0 3.5 - 5.1 mEq/L   Chloride 104 96 - 112 mEq/L   CO2 24 19 - 32 mEq/L   Glucose, Bld 97 70 - 99 mg/dL   BUN 17 6 - 23 mg/dL   Creatinine, Ser 1.32 (H) 0.40 - 1.20 mg/dL   Total Bilirubin 0.3 0.2 - 1.2 mg/dL   Alkaline Phosphatase 59 39 - 117 U/L   AST 16 0 - 37 U/L   ALT 10 0 - 35 U/L   Total Protein 7.2 6.0 - 8.3 g/dL   Albumin 4.1 3.5 - 5.2 g/dL   GFR 44.01 (L) >02.72 mL/min    Comment: Calculated using the CKD-EPI Creatinine Equation (2021)   Calcium 9.6 8.4 - 10.5 mg/dL  CBC w/Diff     Status: Abnormal   Collection Time: 06/12/23  4:27 PM  Result Value Ref Range   WBC 8.0 4.0 - 10.5 K/uL   RBC 4.46 3.87 - 5.11 Mil/uL   Hemoglobin 13.3 12.0 - 15.0 g/dL   HCT 53.6 64.4 - 03.4 %   MCV 93.1 78.0 - 100.0 fl   MCHC 32.0 30.0 - 36.0 g/dL   RDW 74.2 (H) 59.5 - 63.8 %   Platelets 358.0 150.0 - 400.0 K/uL   Neutrophils Relative % 55.4 43.0 - 77.0 %   Lymphocytes Relative 34.8 12.0 - 46.0 %   Monocytes Relative  6.6 3.0 - 12.0 %   Eosinophils Relative 2.7 0.0 - 5.0 %   Basophils Relative 0.5 0.0 - 3.0 %   Neutro Abs 4.5 1.4 - 7.7 K/uL   Lymphs Abs 2.8 0.7 - 4.0 K/uL   Monocytes Absolute 0.5 0.1 - 1.0 K/uL   Eosinophils Absolute 0.2 0.0 - 0.7 K/uL   Basophils Absolute 0.0 0.0 - 0.1 K/uL     Psychiatric Specialty Exam: Physical Exam  Review of Systems  Weight 225 lb (102.1 kg).There is no height or weight on file to calculate BMI.  General  Appearance: Casual  Eye Contact:  Good  Speech:  Normal Rate  Volume:  Normal  Mood:  Euthymic  Affect:  Appropriate  Thought Process:  Goal Directed  Orientation:  Full (Time, Place, and Person)  Thought Content:  Logical  Suicidal Thoughts:  No  Homicidal Thoughts:  No  Memory:  Immediate;   Good Recent;   Good Remote;   Good  Judgement:  Good  Insight:  Good  Psychomotor Activity:  Normal  Concentration:  Concentration: Good and Attention Span: Good  Recall:  Good  Fund of Knowledge:  Good  Language:  Good  Akathisia:  No  Handed:  Right  AIMS (if indicated):     Assets:  Communication Skills Desire for Improvement Housing Resilience Transportation  ADL's:  Intact  Cognition:  WNL  Sleep:  ok     Assessment/Plan: MDD (major depressive disorder), recurrent episode, moderate (HCC) - Plan: DULoxetine HCl 40 MG CPEP, gabapentin (NEURONTIN) 300 MG capsule, lamoTRIgine (LAMICTAL) 150 MG tablet  Anxiety - Plan: DULoxetine HCl 40 MG CPEP, gabapentin (NEURONTIN) 300 MG capsule  Obsessive-compulsive disorder, unspecified type - Plan: gabapentin (NEURONTIN) 300 MG capsule  I review current medication.  Recommend not to take the gabapentin 100 mg up to 6 times a day prescribed by her pain medicine as patient already taking gabapentin 300 mg 2 times a day from our office.  Patient acknowledged and agreed.  Patient does not want to change other medication as seems to be doing much better.  She has no rash, itching.  I reviewed blood work results.  Her creatinine is 1.23.  Encouraged hydration.  Continue Lamictal 150 mg daily, Cymbalta 40 mg daily and gabapentin 300 mg twice a day.  Recommended to call us back if she has any question or any concern.  Follow-up in 3 months.   Follow Up Instructions:     I discussed the assessment and treatment plan with the patient. The patient was provided an opportunity to ask questions and all were answered. The patient agreed with the plan and  demonstrated an understanding of the instructions.   The patient was advised to call back or seek an in-person evaluation if the symptoms worsen or if the condition fails to improve as anticipated.    Collaboration of Care: Other provider involved in patient's care AEB notes are available in epic to review  Patient/Guardian was advised Release of Information must be obtained prior to any record release in order to collaborate their care with an outside provider. Patient/Guardian was advised if they have not already done so to contact the registration department to sign all necessary forms in order for [place] to release information regarding their care.   Consent: Patient/Guardian gives verbal consent for treatment and assignment of benefits for services provided during this visit. Patient/Guardian expressed understanding and agreed to proceed.     I provided 20 minutes of non face to  face time during this encounter.  [name]: This document was prepared by [employer] voice dictation technology and any errors that results from this process are unintentional.    Cleotis Nipper, MD 08/13/2023

## 2023-08-15 DIAGNOSIS — M542 Cervicalgia: Secondary | ICD-10-CM | POA: Diagnosis not present

## 2023-08-15 DIAGNOSIS — G894 Chronic pain syndrome: Secondary | ICD-10-CM | POA: Diagnosis not present

## 2023-08-15 DIAGNOSIS — Z79891 Long term (current) use of opiate analgesic: Secondary | ICD-10-CM | POA: Diagnosis not present

## 2023-08-15 DIAGNOSIS — M5416 Radiculopathy, lumbar region: Secondary | ICD-10-CM | POA: Diagnosis not present

## 2023-08-18 ENCOUNTER — Telehealth: Payer: BLUE CROSS/BLUE SHIELD | Admitting: Physician Assistant

## 2023-08-18 DIAGNOSIS — R197 Diarrhea, unspecified: Secondary | ICD-10-CM | POA: Diagnosis not present

## 2023-08-18 NOTE — Progress Notes (Signed)
E-Visit for Diarrhea  We are sorry that you are not feeling well.  Here is how we plan to help!  Based on what you have shared with me it looks like you have Acute Infectious Diarrhea.  Most cases of acute diarrhea are due to infections with virus and bacteria and are self-limited conditions lasting less than 14 days.  For your symptoms you may take Imodium 2 mg tablets that are over the counter at your local pharmacy. Take two tablet now and then one after each loose stool up to 6 a day.  Antibiotics are not needed for most people with diarrhea.  HOME CARE We recommend changing your diet to help with your symptoms for the next few days. Drink plenty of fluids that contain water salt and sugar. Sports drinks such as Gatorade may help.  You may try broths, soups, bananas, applesauce, soft breads, mashed potatoes or crackers.  You are considered infectious for as long as the diarrhea continues. Hand washing or use of alcohol based hand sanitizers is recommend. It is best to stay out of work or school until your symptoms stop.   GET HELP RIGHT AWAY If you have dark yellow colored urine or do not pass urine frequently you should drink more fluids.   If your symptoms worsen  If you feel like you are going to pass out (faint) You have a new problem  MAKE SURE YOU  Understand these instructions. Will watch your condition. Will get help right away if you are not doing well or get worse.  Thank you for choosing an e-visit.  Your e-visit answers were reviewed by a board certified advanced clinical practitioner to complete your personal care plan. Depending upon the condition, your plan could have included both over the counter or prescription medications.  Please review your pharmacy choice. Make sure the pharmacy is open so you can pick up prescription now. If there is a problem, you may contact your provider through [employer] and have the prescription routed to another pharmacy.   Your safety is important to [place]. If you have drug allergies check your prescription carefully.   For the next 24 hours you can use MyChart to ask questions about today's visit, request a non-urgent call back, or ask for a work or school excuse. You will get an email in the next two days asking about your experience. I hope that your e-visit has been valuable and will speed your recovery.  I have spent 5 minutes in review of e-visit questionnaire, review and updating patient chart, medical decision making and response to patient.   Mar Daring, PA-C

## 2023-09-10 ENCOUNTER — Encounter: Payer: Self-pay | Admitting: Gastroenterology

## 2023-09-15 ENCOUNTER — Other Ambulatory Visit: Payer: Self-pay | Admitting: Family Medicine

## 2023-09-15 ENCOUNTER — Encounter: Payer: Self-pay | Admitting: Family Medicine

## 2023-09-15 NOTE — Telephone Encounter (Signed)
Noted. Med sent to PCP.

## 2023-09-16 NOTE — Telephone Encounter (Signed)
Requesting:     ALPRAZolam (XANAX) 1 MG tablet [Philip H McGowen] Contract: Yes UDS: 05/16/23 Last Visit: 05/16/2023 Next Visit: 11/14/2023 Last Refill: 07/18/23 (540,0)   Please Advise

## 2023-09-17 MED ORDER — ALPRAZOLAM 1 MG PO TABS: ORAL_TABLET | ORAL | 0 refills | Status: DC

## 2023-09-26 ENCOUNTER — Encounter: Payer: BLUE CROSS/BLUE SHIELD | Admitting: Gastroenterology

## 2023-10-06 ENCOUNTER — Encounter: Payer: Self-pay | Admitting: Family Medicine

## 2023-10-06 ENCOUNTER — Telehealth: Payer: Managed Care, Other (non HMO) | Admitting: Family Medicine

## 2023-10-06 DIAGNOSIS — R197 Diarrhea, unspecified: Secondary | ICD-10-CM | POA: Diagnosis not present

## 2023-10-06 NOTE — Progress Notes (Signed)

## 2023-10-09 ENCOUNTER — Encounter: Payer: Self-pay | Admitting: Family Medicine

## 2023-10-09 ENCOUNTER — Other Ambulatory Visit (HOSPITAL_COMMUNITY): Payer: Self-pay | Admitting: Psychiatry

## 2023-10-09 DIAGNOSIS — F419 Anxiety disorder, unspecified: Secondary | ICD-10-CM

## 2023-10-09 DIAGNOSIS — F429 Obsessive-compulsive disorder, unspecified: Secondary | ICD-10-CM

## 2023-10-09 DIAGNOSIS — F331 Major depressive disorder, recurrent, moderate: Secondary | ICD-10-CM

## 2023-10-09 MED ORDER — OMEPRAZOLE 40 MG PO CPDR: 40.0000 mg | DELAYED_RELEASE_CAPSULE | Freq: Two times a day (BID) | ORAL | 0 refills | Status: DC

## 2023-10-11 ENCOUNTER — Other Ambulatory Visit (HOSPITAL_COMMUNITY): Payer: Self-pay | Admitting: Psychiatry

## 2023-10-11 DIAGNOSIS — F419 Anxiety disorder, unspecified: Secondary | ICD-10-CM

## 2023-10-11 DIAGNOSIS — F331 Major depressive disorder, recurrent, moderate: Secondary | ICD-10-CM

## 2023-10-31 ENCOUNTER — Ambulatory Visit (AMBULATORY_SURGERY_CENTER): Payer: Managed Care, Other (non HMO) | Admitting: *Deleted

## 2023-10-31 VITALS — Ht 66.0 in | Wt 233.0 lb

## 2023-10-31 DIAGNOSIS — Z1211 Encounter for screening for malignant neoplasm of colon: Secondary | ICD-10-CM

## 2023-10-31 MED ORDER — NA SULFATE-K SULFATE-MG SULF 17.5-3.13-1.6 GM/177ML PO SOLN: 1.0000 | Freq: Once | ORAL | 0 refills | Status: AC

## 2023-10-31 NOTE — Progress Notes (Signed)
Pre visit completed over telephone. Instructions mailed. Patient preferred to use her pharmacy   No egg or soy allergy known to patient  No issues known to pt with past sedation with any surgeries or procedures Patient denies ever being told they had issues or difficulty with intubation  No FH of Malignant Hyperthermia Pt is not on diet pills Pt is not on  home 02  Pt is not on blood thinners  Pt denies issues with constipation  No A fib or A flutter Have any cardiac testing pending--NO Pt instructed to use [email or web address] or GoodRx for a price reduction on prep

## 2023-11-07 ENCOUNTER — Encounter: Payer: Self-pay | Admitting: Gastroenterology

## 2023-11-11 ENCOUNTER — Telehealth (HOSPITAL_COMMUNITY): Payer: BLUE CROSS/BLUE SHIELD | Admitting: Psychiatry

## 2023-11-12 ENCOUNTER — Encounter (HOSPITAL_COMMUNITY): Payer: Self-pay

## 2023-11-12 ENCOUNTER — Telehealth (HOSPITAL_BASED_OUTPATIENT_CLINIC_OR_DEPARTMENT_OTHER): Payer: Self-pay | Admitting: Psychiatry

## 2023-11-12 DIAGNOSIS — Z91199 Patient's noncompliance with other medical treatment and regimen due to unspecified reason: Secondary | ICD-10-CM

## 2023-11-12 NOTE — Progress Notes (Signed)
 Patient is no-show on video platform.  I called and left a voicemail to reschedule appointment.

## 2023-11-14 ENCOUNTER — Ambulatory Visit: Payer: BLUE CROSS/BLUE SHIELD | Admitting: Family Medicine

## 2023-11-17 ENCOUNTER — Ambulatory Visit (AMBULATORY_SURGERY_CENTER): Payer: Managed Care, Other (non HMO) | Admitting: Gastroenterology

## 2023-11-17 ENCOUNTER — Encounter: Payer: Self-pay | Admitting: Gastroenterology

## 2023-11-17 VITALS — BP 146/93 | HR 74 | Temp 97.9°F | Resp 11 | Ht 66.0 in | Wt 233.0 lb

## 2023-11-17 DIAGNOSIS — K648 Other hemorrhoids: Secondary | ICD-10-CM

## 2023-11-17 DIAGNOSIS — I10 Essential (primary) hypertension: Secondary | ICD-10-CM | POA: Diagnosis not present

## 2023-11-17 DIAGNOSIS — Z1211 Encounter for screening for malignant neoplasm of colon: Secondary | ICD-10-CM

## 2023-11-17 DIAGNOSIS — E782 Mixed hyperlipidemia: Secondary | ICD-10-CM | POA: Diagnosis not present

## 2023-11-17 DIAGNOSIS — K5730 Diverticulosis of large intestine without perforation or abscess without bleeding: Secondary | ICD-10-CM

## 2023-11-17 DIAGNOSIS — D123 Benign neoplasm of transverse colon: Secondary | ICD-10-CM

## 2023-11-17 DIAGNOSIS — F419 Anxiety disorder, unspecified: Secondary | ICD-10-CM | POA: Diagnosis not present

## 2023-11-17 MED ORDER — SODIUM CHLORIDE 0.9 % IV SOLN: 500.0000 mL | Freq: Once | INTRAVENOUS | Status: DC

## 2023-11-17 NOTE — Patient Instructions (Addendum)
 Resume previous diet.                           - Continue present medications.                           - Await pathology results.  Handout on polyps and diverticulosis given.        YOU HAD AN ENDOSCOPIC PROCEDURE TODAY AT THE [facility]:   Refer to the procedure report that was given to you for any specific questions about what was found during the examination.  If the procedure report does not answer your questions, please call your gastroenterologist to clarify.  If you requested that your care partner not be given the details of your procedure findings, then the procedure report has been included in a sealed envelope for you to review at your convenience later.  YOU SHOULD EXPECT: Some feelings of bloating in the abdomen. Passage of more gas than usual.  Walking can help get rid of the air that was put into your GI tract during the procedure and reduce the bloating. If you had a lower endoscopy (such as a colonoscopy or flexible sigmoidoscopy) you may notice spotting of blood in your stool or on the toilet paper. If you underwent a bowel prep for your procedure, you may not have a normal bowel movement for a few days.  Please Note:  You might notice some irritation and congestion in your nose or some drainage.  This is from the oxygen used during your procedure.  There is no need for concern and it should clear up in a day or so.  SYMPTOMS TO REPORT IMMEDIATELY:  Following lower endoscopy (colonoscopy or flexible sigmoidoscopy):  Excessive amounts of blood in the stool  Significant tenderness or worsening of abdominal pains  Swelling of the abdomen that is new, acute  Fever of 100F or higher   For urgent or emergent issues, a gastroenterologist can be reached at any hour by calling [phone]. Do not use MyChart messaging for urgent concerns.    DIET:  We do recommend a small meal at first, but then you may proceed to your regular diet.  Drink plenty of fluids  but you should avoid alcoholic beverages for 24 hours.  ACTIVITY:  You should plan to take it easy for the rest of today and you should NOT DRIVE or use heavy machinery until tomorrow (because of the sedation medicines used during the test).    FOLLOW UP: Our staff will call the number listed on your records the next business day following your procedure.  We will call around 7:15- 8:00 am to check on you and address any questions or concerns that you may have regarding the information given to you following your procedure. If we do not reach you, we will leave a message.     If any biopsies were taken you will be contacted by phone or by letter within the next 1-3 weeks.  Please call us  at [phone] if you have not heard about the biopsies in 3 weeks.    SIGNATURES/CONFIDENTIALITY: You and/or your care partner have signed paperwork which will be entered into your electronic medical record.  These signatures attest to the fact that that the information above on your After Visit Summary has been reviewed and is understood.  Full responsibility of the confidentiality of this discharge information lies with you  and/or your care-partner.

## 2023-11-17 NOTE — Progress Notes (Signed)
  Gastroenterology History and Physical   Primary Care Physician:  Shelvia Dick, MD   Reason for Procedure:   Colon cancer screening  Plan:    colonoscopy     HPI: Toni [name] is a 48 y.o. female  here for colonoscopy screening - first time exam.   Patient denies any bowel symptoms at this time. No family history of colon cancer known in first degree relatives. Otherwise feels well without any cardiopulmonary symptoms.   I have discussed risks / benefits of anesthesia and endoscopic procedure with Arlina Benjamin and they wish to proceed with the exams as outlined today.    Past Medical History:  Diagnosis Date   Acute renal failure (HCC) 04/26/2021   prerenal azotemia   Allergic rhinitis    Altered sensorium due to hypoglycemia 03/2016   Anxiety and depression    started with PPD   Chronic neck pain    Myofascial pain syndrome per [facility], oxycodone  per their office pain med contract.  (Normal cervical MRI 2010 per ortho notes).   Easy bruisability 2011   Hx of: saw Hematologist (Dr. Isidor Marek) and w/u neg for von Willebrand desease.   GERD (gastroesophageal reflux disease)    hx gastric ulcer and upper GI bleed, ? 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 Pt to start FeSO4 325 qd 10/28/19   Irregular menses 03/2018   GYN started [facility]  0.25-0.35.  [facility] as of 10/2018.  u/s wnl.  Endom bx NEG. OCPs helped.   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 [place] Hacker   Seizure [facility]) 2015   s/p eval with neuro, no medication, no events since   Syncope    with ? seizure in  2015, eval with neruo   Vitamin B12 deficiency    Vitamin D  deficiency     Past Surgical History:  Procedure Laterality Date   CESAREAN SECTION  2006   ELBOW X-RAY Left 12/07/2019   ENDOMETRIAL BIOPSY     EYE SURGERY Bilateral    2001   FINGER X-RAY Right 12/07/2019   ROUX-EN-Y GASTRIC BYPASS  01/05/2002   TONSILLECTOMY AND ADENOIDECTOMY     TUBAL LIGATION  2006   tubes in ears both Bilateral     Prior to Admission medications   Medication Sig Start Date End Date Taking? Authorizing Provider  ALPRAZolam  (XANAX ) 1 MG tablet Take 2 tablets by mouth 2 (two) times daily.   Yes [provider]  Calcium  250 MG CAPS Take 1 capsule by mouth 3 (three) times daily.   Yes [provider]  ESTARYLLA  0.25-35 MG-MCG tablet Take 1 tablet by mouth daily. 01/16/23  Yes Dillard, Naima, MD  famotidine  (PEPCID ) 20 MG tablet Take 1 tablet (20 mg total) by mouth 2 (two) times daily. 06/10/23  Yes McGowen, Minetta Aly, MD  lamoTRIgine  (LAMICTAL ) 150 MG tablet Take 1 tablet (150 mg total) by mouth daily. 08/13/23  Yes Arfeen, Bronson Canny, MD  lisinopril  (ZESTRIL ) 20 MG tablet TAKE 1 TABLET(20 MG) BY MOUTH DAILY 04/14/23  Yes  McGowen, Minetta Aly, MD  omeprazole  (PRILOSEC) 40 MG capsule Take 1 capsule (40 mg total) by mouth 2 (two) times daily. 10/09/23  Yes McGowen, Minetta Aly, MD  oxyCODONE -acetaminophen  (PERCOCET) 10-325 MG tablet Take 1 tablet by mouth every 6 (six) hours as needed for pain. 05/31/19  Yes [provider]  Vitamin D , Ergocalciferol , (DRISDOL ) 1.25 MG (50000 UNIT) CAPS capsule Take 1 capsule (50,000 Units total) by mouth 2 (two) times a week. 05/19/23  Yes McGowen, Minetta Aly, MD  cyanocobalamin  (VITAMIN B12) 1000 MCG/ML injection Inject 1 mL (1,000 mcg total) into the muscle every 2 weeks 01/16/23   McGowen, Minetta Aly, MD  DULoxetine  HCl 40 MG CPEP Take 1 capsule (40 mg) by mouth daily. 08/13/23   Arfeen, Bronson Canny, MD  ferrous gluconate  (FERGON) 324 MG tablet Take 1 tablet (324 mg total) by  mouth daily with breakfast. Patient not taking: Reported on 11/17/2023 03/18/22   McGowen, Philip H, MD  gabapentin  (NEURONTIN ) 300 MG capsule Take 1 capsule (300 mg total) by mouth 2 (two) times daily. 08/13/23   Arfeen, Henrine Logan T, MD  glucose blood (FREESTYLE LITE) test strip USE TO CHECK BLOOD SUGAR 3 TIMES A DAY 06/06/20   McGowen, Minetta Aly, MD  levocetirizine (XYZAL ) 5 MG tablet Take 1 tablet (5 mg total) by mouth every evening. 06/27/23   McGowen, Minetta Aly, MD  naloxone [facility]) nasal spray 4 mg/0.1 mL Place 1 spray into the nose once. 02/14/23   [provider]  ondansetron  (ZOFRAN -ODT) 4 MG disintegrating tablet Take 1 tablet (4 mg total) by mouth every 8 (eight) hours as needed. 06/20/23   Angelia Kelp, PA-C  Ubrogepant  (UBRELVY ) 50 MG TABS 1-2 tabs po once a day as needed for migraine headache 05/16/23   McGowen, Minetta Aly, MD    Current Outpatient Medications  Medication Sig Dispense Refill   ALPRAZolam  (XANAX ) 1 MG tablet Take 2 tablets by mouth 2 (two) times daily.     Calcium  250 MG CAPS Take 1 capsule by mouth 3 (three) times daily.     ESTARYLLA  0.25-35 MG-MCG tablet Take 1 tablet by mouth daily.     famotidine  (PEPCID ) 20 MG tablet Take 1 tablet (20 mg total) by mouth 2 (two) times daily. 60 tablet 5   lamoTRIgine  (LAMICTAL ) 150 MG tablet Take 1 tablet (150 mg total) by mouth daily. 90 tablet 0   lisinopril  (ZESTRIL ) 20 MG tablet TAKE 1 TABLET(20 MG) BY MOUTH DAILY 90 tablet 1   omeprazole  (PRILOSEC) 40 MG capsule Take 1 capsule (40 mg total) by mouth 2 (two) times daily. 90 capsule 0   oxyCODONE -acetaminophen  (PERCOCET) 10-325 MG tablet Take 1 tablet by mouth every 6 (six) hours as needed for pain.     Vitamin D , Ergocalciferol , (DRISDOL ) 1.25 MG (50000 UNIT) CAPS capsule Take 1 capsule (50,000 Units total) by mouth 2 (two) times a week. 24 capsule 1   cyanocobalamin  (VITAMIN B12) 1000 MCG/ML injection Inject 1 mL (1,000 mcg total) into the muscle every 2 weeks 10 mL 1    DULoxetine  HCl 40 MG CPEP Take 1 capsule (40 mg) by mouth daily. 90 capsule 0   ferrous gluconate  (FERGON) 324 MG tablet Take 1 tablet (324 mg total) by mouth daily with breakfast. (Patient not taking: Reported on 11/17/2023) 90 tablet 1   gabapentin  (NEURONTIN ) 300 MG capsule Take 1 capsule (300 mg total) by mouth 2 (two) times daily. 180 capsule 0   glucose blood (FREESTYLE LITE) test strip USE TO  CHECK BLOOD SUGAR 3 TIMES A DAY 200 strip 12   levocetirizine (XYZAL ) 5 MG tablet Take 1 tablet (5 mg total) by mouth every evening. 30 tablet 2   naloxone (NARCAN) nasal spray 4 mg/0.1 mL Place 1 spray into the nose once.     ondansetron  (ZOFRAN -ODT) 4 MG disintegrating tablet Take 1 tablet (4 mg total) by mouth every 8 (eight) hours as needed. 20 tablet 0   Ubrogepant  (UBRELVY ) 50 MG TABS 1-2 tabs po once a day as needed for migraine headache 30 tablet 1   Current Facility-Administered Medications  Medication Dose Route Frequency Provider Last Rate Last Admin   0.9 %  sodium chloride  infusion  500 mL Intravenous Once Shyanne Mcclary, Lendon Queen, MD        Allergies as of 11/17/2023 - Review Complete 11/17/2023  Allergen Reaction Noted   Ibuprofen Nausea Only 04/24/2015    Family History  Problem Relation Age of Onset   Arthritis Mother    Hyperlipidemia Mother    Heart disease Mother    Diabetes Mother    Asthma Mother    Aneurysm Father        deceased   Lung cancer Maternal Aunt        smoker   Lung cancer Maternal Aunt        smoker   Colon cancer Maternal Grandmother    Esophageal cancer Neg Hx    Rectal cancer Neg Hx    Stomach cancer Neg Hx     Social History   Socioeconomic History   Marital status: Legally Separated    Spouse name: Not on file   Number of children: Not on file   Years of education: Not on file   Highest education level: Associate degree: occupational, Scientist, product/process development, or vocational program  Occupational History   Not on file  Tobacco Use   Smoking status:  Never   Smokeless tobacco: Never  Vaping Use   Vaping status: Never Used  Substance and Sexual Activity   Alcohol  use: Yes    Alcohol /week: 0.0 standard drinks of alcohol     Comment: occasional    Drug use: No   Sexual activity: Not on file  Other Topics Concern   Not on file  Social History Narrative   Married.   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   No T/A/Ds.   Social Drivers of Health   Financial Resource Strain: High Risk (12/07/2021)   Overall Financial Resource Strain (CARDIA)    Difficulty of Paying Living Expenses: Hard  Food Insecurity: Food Insecurity Present (12/07/2021)   Hunger Vital Sign    Worried About Running Out of Food in the Last Year: Sometimes true    Ran Out of Food in the Last Year: Sometimes true  Transportation Needs: Unknown (12/07/2021)   PRAPARE - Administrator, Civil Service (Medical): Not on file    Lack of Transportation (Non-Medical): No  Physical Activity: Insufficiently Active (12/07/2021)   Exercise Vital Sign    Days of Exercise per Week: 3 days    Minutes of Exercise per Session: 30 min  Stress: Stress Concern Present (12/07/2021)   [employer] of Occupational Health - Occupational Stress Questionnaire    Feeling of Stress : Very much  Social Connections: Socially Isolated (12/07/2021)   Social Connection and Isolation Panel [NHANES]    Frequency of Communication with Friends and Family: Three times a week    Frequency of Social Gatherings with Friends and Family: Three times  a week    Attends Religious Services: Never    Active Member of Clubs or Organizations: No    Attends Engineer, structural: Not on file    Marital Status: Divorced  Intimate Partner Violence: Not on file    Review of Systems: All other review of systems negative except as mentioned in the HPI.  Physical Exam: Vital signs BP 111/70   Pulse 85   Temp 97.9 F (36.6 C)   Ht 5\' 6"  (1.676 m)   Wt 233 lb (105.7 kg)   LMP 10/27/2023  (Within Days)   SpO2 100%   BMI 37.61 kg/m   General:   Alert,  Well-developed, pleasant and cooperative in NAD Lungs:  Clear throughout to auscultation.   Heart:  Regular rate and rhythm Abdomen:  Soft, nontender and nondistended.   Neuro/Psych:  Alert and cooperative. Normal mood and affect. A and O x 3  Christi Coward, MD [facility]

## 2023-11-17 NOTE — Op Note (Signed)
 [facility] Patient Name: Toni [name] Procedure Date: 11/17/2023 8:26 AM MRN: [id number] Endoscopist: Landon Pinion P. General Kenner , MD, [id number] Age: 48 Referring MD:  Date of Birth: [birth year] Gender: Female Account #: [id number] Procedure:                Colonoscopy Indications:              Screening for colorectal malignant neoplasm, This                            is the patient's first colonoscopy Medicines:                Monitored Anesthesia Care Procedure:                Pre-Anesthesia Assessment:                           - Prior to the procedure, a History and Physical                            was performed, and patient medications and                            allergies were reviewed. The patient's tolerance of                            previous anesthesia was also reviewed. The risks                            and benefits of the procedure and the sedation                            options and risks were discussed with the patient.                            All questions were answered, and informed consent                            was obtained. Prior Anticoagulants: The patient has                            taken no anticoagulant or antiplatelet agents. ASA                            Grade Assessment: III - A patient with severe                            systemic disease. After reviewing the risks and                            benefits, the patient was deemed in satisfactory                            condition to undergo the procedure.  After obtaining informed consent, the colonoscope                            was passed under direct vision. Throughout the                            procedure, the patient's blood pressure, pulse, and                            oxygen saturations were monitored continuously. The                            CF [id number] #[id number] was introduced through the anus                            and advanced  to the the cecum, identified by                            appendiceal orifice and ileocecal valve. The                            colonoscopy was performed without difficulty. The                            patient tolerated the procedure well. The quality                            of the bowel preparation was adequate. The                            ileocecal valve, appendiceal orifice, and rectum                            were photographed. Scope In: 8:41:08 AM Scope Out: 9:07:28 AM Scope Withdrawal Time: 0 hours 21 minutes 19 seconds  Total Procedure Duration: 0 hours 26 minutes 20 seconds  Findings:                 The perianal and digital rectal examinations were                            normal.                           Two sessile polyps were found in the transverse                            colon. The polyps were 2 to 3 mm in size. These                            polyps were removed with a cold snare. Resection                            and retrieval were complete.  Multiple small-mouthed diverticula were found in                            the left colon.                           Internal hemorrhoids were found during                            retroflexion. The hemorrhoids were small.                           There was residual in the left colon which took                            several minutes to lavage. The exam was otherwise                            without abnormality. Complications:            No immediate complications. Estimated blood loss:                            Minimal. Estimated Blood Loss:     Estimated blood loss was minimal. Impression:               - Two 2 to 3 mm polyps in the transverse colon,                            removed with a cold snare. Resected and retrieved.                           - Diverticulosis in the left colon.                           - Internal hemorrhoids.                           - The  examination was otherwise normal. Recommendation:           - Patient has a contact number available for                            emergencies. The signs and symptoms of potential                            delayed complications were discussed with the                            patient. Return to normal activities tomorrow.                            Written discharge instructions were provided to the                            patient.                           -  Resume previous diet.                           - Continue present medications.                           - Await pathology results. Landon Pinion P. Emali Heyward, MD 11/17/2023 9:12:29 AM This report has been signed electronically.

## 2023-11-17 NOTE — Progress Notes (Signed)
 Pt's states no medical or surgical changes since previsit or office visit.

## 2023-11-17 NOTE — Progress Notes (Signed)
 Called to room to assist during endoscopic procedure.  Patient ID and intended procedure confirmed with present staff. Received instructions for my participation in the procedure from the performing physician.

## 2023-11-17 NOTE — Progress Notes (Signed)
 Pt A/O x 3, gd SR's, pleased with anesthesia, report to RN

## 2023-11-18 ENCOUNTER — Telehealth: Payer: Self-pay | Admitting: *Deleted

## 2023-11-18 NOTE — Telephone Encounter (Signed)
  Follow up Call-     11/17/2023    7:46 AM  Call back number  Post procedure Call Back phone  # [phone]  Permission to leave phone message Yes     Patient questions:  Do you have a fever, pain , or abdominal swelling? No. Pain Score  0 *  Have you tolerated food without any problems? Yes.    Have you been able to return to your normal activities? Yes.    Do you have any questions about your discharge instructions: Diet   No. Medications  No. Follow up visit  No.  Do you have questions or concerns about your Care? No.  Actions: * If pain score is 4 or above: No action needed, pain <4.

## 2023-11-19 LAB — SURGICAL PATHOLOGY

## 2023-11-21 ENCOUNTER — Telehealth (HOSPITAL_BASED_OUTPATIENT_CLINIC_OR_DEPARTMENT_OTHER): Payer: BLUE CROSS/BLUE SHIELD | Admitting: Psychiatry

## 2023-11-21 ENCOUNTER — Encounter (HOSPITAL_COMMUNITY): Payer: Self-pay | Admitting: Psychiatry

## 2023-11-21 ENCOUNTER — Ambulatory Visit: Payer: BLUE CROSS/BLUE SHIELD | Admitting: Family Medicine

## 2023-11-21 VITALS — Wt 233.0 lb

## 2023-11-21 DIAGNOSIS — F419 Anxiety disorder, unspecified: Secondary | ICD-10-CM

## 2023-11-21 DIAGNOSIS — F331 Major depressive disorder, recurrent, moderate: Secondary | ICD-10-CM | POA: Diagnosis not present

## 2023-11-21 DIAGNOSIS — F429 Obsessive-compulsive disorder, unspecified: Secondary | ICD-10-CM

## 2023-11-21 MED ORDER — GABAPENTIN 300 MG PO CAPS: 300.0000 mg | ORAL_CAPSULE | Freq: Two times a day (BID) | ORAL | 0 refills | Status: DC

## 2023-11-21 MED ORDER — LAMOTRIGINE 150 MG PO TABS: 150.0000 mg | ORAL_TABLET | Freq: Every day | ORAL | 0 refills | Status: DC

## 2023-11-21 NOTE — Progress Notes (Signed)
[facility] MD Virtual Progress Note   Patient Location: Home Provider Location: Home Office  I connect with patient by video and verified that I am speaking with correct person by using two identifiers. I discussed the limitations of evaluation and management by telemedicine and the availability of in person appointments. I also discussed with the patient that there may be a patient responsible charge related to this service. The patient expressed understanding and agreed to proceed.  Toni [name] [id number] 48 y.o.  11/21/2023 11:07 AM  History of Present Illness:  Patient is evaluated by video session.  She is doing much better since started the new job 3 months ago.  She is working from home and she has more flexibility.  Her cousin is living with her and that has been very helpful as patient has some support.  She reported Cymbalta did not cover her insurance and she has been without the medicine for past 4 to 6 weeks.  She does not feel that her symptoms are getting worse.  She feels her sleep is improved and denies any crying spells or any feeling of hopelessness or worthlessness.  She has no tremors.  She was thinking to start therapy with Toni [name] but symptoms are much better and her racing thoughts are not as intense so she decided to not pursue therapy.  She is taking Lamictal and gabapentin.  She also on Xanax prescribed by other provider.  She also taking pain medicine.  She reported her living situation and job stability has improved her a lot.  Recently her 69 year old son had birthday.  Patient told him and his younger son lives in [place].  Patient denies drinking or using any illegal substances.  She noticed gabapentin had helped her a lot and like to keep the current medication.  She has no rash, itching, concerns from Lamictal.  Her sleep is good.  Her obsessive thoughts are stable.  She denies any major panic attack.  Past Psychiatric History: H/O depression  after got married.  Tried Zoloft for years but stopped after seizure. PCP tried trazodone, Ambien, Doxepin and Seroquel but  did not work.  We tried higher dose of Cymbalta but developed side effects.  H/O irritability, poor impulse control with excessive buying and shopping but no history of mania, psychosis, suicidal attempt or inpatient treatment.     Outpatient Encounter Medications as of 11/21/2023  Medication Sig   ALPRAZolam (XANAX) 1 MG tablet Take 2 tablets by mouth 2 (two) times daily.   Calcium 250 MG CAPS Take 1 capsule by mouth 3 (three) times daily.   cyanocobalamin (VITAMIN B12) 1000 MCG/ML injection Inject 1 mL (1,000 mcg total) into the muscle every 2 weeks   DULoxetine HCl 40 MG CPEP Take 1 capsule (40 mg) by mouth daily.   ESTARYLLA 0.25-35 MG-MCG tablet Take 1 tablet by mouth daily.   famotidine (PEPCID) 20 MG tablet Take 1 tablet (20 mg total) by mouth 2 (two) times daily.   ferrous gluconate (FERGON) 324 MG tablet Take 1 tablet (324 mg total) by mouth daily with breakfast. (Patient not taking: Reported on 11/17/2023)   gabapentin (NEURONTIN) 300 MG capsule Take 1 capsule (300 mg total) by mouth 2 (two) times daily.   glucose blood (FREESTYLE LITE) test strip USE TO CHECK BLOOD SUGAR 3 TIMES A DAY   lamoTRIgine (LAMICTAL) 150 MG tablet Take 1 tablet (150 mg total) by mouth daily.   levocetirizine (XYZAL) 5 MG tablet Take 1 tablet (5 mg total)  by mouth every evening.   lisinopril (ZESTRIL) 20 MG tablet TAKE 1 TABLET(20 MG) BY MOUTH DAILY   naloxone (NARCAN) nasal spray 4 mg/0.1 mL Place 1 spray into the nose once.   omeprazole (PRILOSEC) 40 MG capsule Take 1 capsule (40 mg total) by mouth 2 (two) times daily.   ondansetron (ZOFRAN-ODT) 4 MG disintegrating tablet Take 1 tablet (4 mg total) by mouth every 8 (eight) hours as needed.   oxyCODONE-acetaminophen (PERCOCET) 10-325 MG tablet Take 1 tablet by mouth every 6 (six) hours as needed for pain.   Ubrogepant (UBRELVY) 50 MG TABS  1-2 tabs po once a day as needed for migraine headache   Vitamin D, Ergocalciferol, (DRISDOL) 1.25 MG (50000 UNIT) CAPS capsule Take 1 capsule (50,000 Units total) by mouth 2 (two) times a week.   No facility-administered encounter medications on file as of 11/21/2023.    Recent Results (from the past 2160 hours)  Surgical pathology ([facility])     Status: None   Collection Time: 11/17/23 12:00 AM  Result Value Ref Range   SURGICAL PATHOLOGY      SURGICAL PATHOLOGY [facility] [address], [address] [place], [place] [address] Telephone [phone] or [phone] Fax [phone]  REPORT OF SURGICAL PATHOLOGY   Accession #: [id number] Patient Name: Toni [name] Visit # : [id number]  MRN: [id number] Physician: Toni [name] DOB/Age [birth year] (Age: 21) Gender: F Collected Date: 11/17/2023 Received Date: 11/18/2023  FINAL DIAGNOSIS       1. 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       DATE SIGNED OUT: 11/19/2023 ELECTRONIC SIGNATURE : Toni [name], Toni [name], Pathologist, Electronic Signature  MICROSCOPIC DESCRIPTION  CASE COMMENTS STAINS USED IN DIAGNOSIS: H&E    CLINICAL HISTORY  SPECIMEN(S) OBTAINED 1. Surgical [P], Colon, Transverse Polyp X 2, Polyp (2)  SPECIMEN COMMENTS: 1. Special screening for malignant neoplasms, colon; benign neoplasm of transverse colon SPECIMEN CLINICAL INFORMAT ION: 1. R/O adenoma    Gross Description 1. Received in formalin are tan, soft tissue fragments that are submitted in toto. Number: 2, Size: 0.4 cm smallest to 0.7 cm largest, (1B) ( TT )        Report signed out from the following location(s) [place] [address], [place] [address] CLIA #: [id number]  [facility] [address] [place], [place] [address] CLIA #: [id number]      Psychiatric Specialty  Exam: Physical Exam  Review of Systems  Weight 233 lb (105.7 kg), last menstrual period 10/27/2023.There is no height or weight on file to calculate BMI.  General Appearance: Casual  Eye Contact:  Good  Speech:  Clear and Coherent and Normal Rate  Volume:  Normal  Mood:  Euthymic  Affect:  Appropriate  Thought Process:  Goal Directed  Orientation:  Full (Time, Place, and Person)  Thought Content:  Logical  Suicidal Thoughts:  No  Homicidal Thoughts:  No  Memory:  Immediate;   Good Recent;   Good Remote;   Good  Judgement:  Intact  Insight:  Present  Psychomotor Activity:  Normal  Concentration:  Concentration: Good and Attention Span: Good  Recall:  Good  Fund of Knowledge:  Good  Language:  Good  Akathisia:  No  Handed:  Right  AIMS (if indicated):     Assets:  Communication Skills Desire for Improvement Housing  Resilience Social Support Talents/Skills  ADL's:  Intact  Cognition:  WNL  Sleep:  ok     Assessment/Plan: MDD (major depressive disorder), recurrent episode, moderate (HCC) - Plan: gabapentin (NEURONTIN) 300 MG capsule, lamoTRIgine (LAMICTAL) 150 MG tablet  Anxiety - Plan: gabapentin (NEURONTIN) 300 MG capsule  Obsessive-compulsive disorder, unspecified type - Plan: gabapentin (NEURONTIN) 300 MG capsule  Patient is stable on gabapentin and Lamictal.  She is not taking Cymbalta which was not approved by her insurance but she also feels that she does not needed as symptoms are going very well.  She also not interested in therapy at this time but will consider if needed in the future.  Continue Lamictal 150 mg daily and gabapentin 300 mg twice a day.  Recommended to call us back with any question or any concern.  Follow-up in 3 months.   Follow Up Instructions:     I discussed the assessment and treatment plan with the patient. The patient was provided an opportunity to ask questions and all were answered. The patient agreed with the plan and demonstrated an  understanding of the instructions.   The patient was advised to call back or seek an in-person evaluation if the symptoms worsen or if the condition fails to improve as anticipated.    Collaboration of Care: Other provider involved in patient's care AEB notes are available in epic to review  Patient/Guardian was advised Release of Information must be obtained prior to any record release in order to collaborate their care with an outside provider. Patient/Guardian was advised if they have not already done so to contact the registration department to sign all necessary forms in order for [place] to release information regarding their care.   Consent: Patient/Guardian gives verbal consent for treatment and assignment of benefits for services provided during this visit. Patient/Guardian expressed understanding and agreed to proceed.     I provided 18 minutes of non face to face time during this encounter.  Note: This document was prepared by [employer] voice dictation technology and any errors that results from this process are unintentional.    Cleotis Nipper, MD 11/21/2023

## 2023-11-22 ENCOUNTER — Encounter: Payer: Self-pay | Admitting: Gastroenterology

## 2023-11-26 ENCOUNTER — Other Ambulatory Visit: Payer: Self-pay | Admitting: Family Medicine

## 2023-11-27 NOTE — Patient Instructions (Signed)
    PLEASE NOTE:  If labs were collected or images ordered, we will inform you of  results once we have received them and reviewed. We will contact you either by echart message, or telephone call.     If we ordered any referrals today, please let us know if you have not heard from their office within the next 2 weeks. You should receive a letter via MyChart confirming if the referral was approved and their office contact information to schedule.

## 2023-11-28 ENCOUNTER — Other Ambulatory Visit: Payer: Self-pay

## 2023-11-28 ENCOUNTER — Ambulatory Visit: Payer: Managed Care, Other (non HMO) | Admitting: Family Medicine

## 2023-11-28 ENCOUNTER — Encounter: Payer: Self-pay | Admitting: Family Medicine

## 2023-11-28 VITALS — BP 122/79 | HR 74 | Ht 66.0 in | Wt 244.4 lb

## 2023-11-28 DIAGNOSIS — F411 Generalized anxiety disorder: Secondary | ICD-10-CM | POA: Diagnosis not present

## 2023-11-28 DIAGNOSIS — E611 Iron deficiency: Secondary | ICD-10-CM

## 2023-11-28 DIAGNOSIS — E559 Vitamin D deficiency, unspecified: Secondary | ICD-10-CM | POA: Diagnosis not present

## 2023-11-28 DIAGNOSIS — K909 Intestinal malabsorption, unspecified: Secondary | ICD-10-CM

## 2023-11-28 DIAGNOSIS — I10 Essential (primary) hypertension: Secondary | ICD-10-CM | POA: Diagnosis not present

## 2023-11-28 DIAGNOSIS — R7989 Other specified abnormal findings of blood chemistry: Secondary | ICD-10-CM

## 2023-11-28 DIAGNOSIS — Z79899 Other long term (current) drug therapy: Secondary | ICD-10-CM

## 2023-11-28 DIAGNOSIS — E538 Deficiency of other specified B group vitamins: Secondary | ICD-10-CM

## 2023-11-28 MED ORDER — FAMOTIDINE 20 MG PO TABS: 20.0000 mg | ORAL_TABLET | Freq: Two times a day (BID) | ORAL | 1 refills | Status: DC

## 2023-11-28 MED ORDER — OMEPRAZOLE 40 MG PO CPDR: 40.0000 mg | DELAYED_RELEASE_CAPSULE | Freq: Two times a day (BID) | ORAL | 1 refills | Status: DC

## 2023-11-28 NOTE — Progress Notes (Signed)
OFFICE VISIT  11/28/2023  CC:  Chief Complaint  Patient presents with   Medical Management of Chronic Issues    Patient is a 48 y.o. 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 Continue 1000 mcg sublingual B12 tab. She cannot tolerate oral iron preparations--> constipation/abdominal discomfort. I recommended she try a half of a tab at a time and/or try a multivitamin with iron to see if she tolerates this. Check vitamin B12 level, CBC, iron panel..   #2 vitamin D deficiency. She has required significantly high dose replacement long-term. Continue 50,000 unit tab twice a week. Check vitamin D level today.   #3 generalized anxiety disorder. We had a conversation again about this.  There has been some discrepancies regarding fill dates of prescriptions, PDMP website data.  I have low suspicion that Tomika is abusing or diverting this medication. I have continued with cautious prescribing of this medication for her.  Controlled substance contract renewed today.  Urine drug screen today. She is prescribed pain medication for chronic pain--> Dr. Ethelene Hal.   #4 hypertension.  Control has been borderline.  Initial blood pressure today 137/91.  Repeat manual 132/82. Continue lisinopril 20 mg a day.   #5 migraine headaches. Rizatriptan no longer helpful.  She had the same problem with Relpax in the past. Will do trial of Ubrelvy 50 mg, 1-2 daily as needed."  INTERIM HX: Patient feels well.  She has a job working at home full-time in [employer] and she feels like she is doing well with this.  She still has significant sleep dysfunction.  She requests alprazolam 1 tab at bedtime in addition to the 2 that she takes in the morning and afternoon. She has been very  stable from a psychiatric standpoint.  She is followed by psychiatry and last saw her psychiatrist 1 week ago.  She was doing good on Lamictal 150 mg a day for major depressive disorder and gabapentin 300 mg twice a day for OCD.  She is not on duloxetine because insurance would not pay it.  However, she feels like she does not need it.  Anxiety levels during the daytime fairly well-controlled but nighttime her mind starts to run that makes it hard to sleep.    PMP AWARE reviewed today: most recent rx for alprazolam was filled 11/25/2023, # 120, rx by me. No red flags.  ROS as above, plus--> no fevers, no CP, no SOB, no wheezing, no cough, no dizziness, no HAs, no rashes, no melena/hematochezia.  No polyuria or polydipsia.  No myalgias or arthralgias.  No focal weakness, paresthesias, or tremors.  No acute vision or hearing abnormalities.  No dysuria or unusual/new urinary urgency or frequency.  No recent changes in lower legs. No n/v/d or abd pain.  No palpitations.     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  (Normal cervical MRI 2010 per ortho notes).   Easy bruisability 2011   Hx of: saw Hematologist (Dr. Cyndie Chime) and w/u neg  for von Willebrand desease.   GERD (gastroesophageal reflux disease)    hx gastric ulcer and upper GI bleed, ? 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 Pt to start FeSO4 325 qd 10/28/19   Irregular menses 03/2018   GYN started [facility] 0.25-0.35.  [facility] as of 10/2018.  u/s wnl.  Endom bx NEG. OCPs helped.    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 Everardo All   Seizure [facility]) 2015   s/p eval with neuro, no medication, no events since   Syncope    with ? seizure in 2015, eval with neruo   Vitamin B12 deficiency    Vitamin D deficiency     Past Surgical History:  Procedure Laterality Date   CESAREAN SECTION  2006   COLONOSCOPY W/ POLYPECTOMY     adenomas 11/17/23-->recall 7 yrs   ELBOW X-RAY Left 12/07/2019   ENDOMETRIAL BIOPSY     EYE SURGERY Bilateral    2001   FINGER X-RAY Right 12/07/2019   ROUX-EN-Y GASTRIC BYPASS  01/05/2002   TONSILLECTOMY AND ADENOIDECTOMY     TUBAL LIGATION  2006   tubes in ears both Bilateral     Outpatient Medications Prior to Visit  Medication Sig Dispense Refill   ALPRAZolam (XANAX) 1 MG tablet Take 2 tablets by mouth 2 (two) times daily.     Calcium 250 MG CAPS Take 1 capsule by mouth 3 (three) times daily.     cyanocobalamin (VITAMIN B12) 1000 MCG/ML injection Inject 1 mL (1,000 mcg total) into the muscle every 2 weeks 10 mL 1   ESTARYLLA 0.25-35 MG-MCG tablet Take 1 tablet by mouth daily.     famotidine (PEPCID) 20 MG tablet Take 1 tablet (20 mg total) by mouth 2 (two) times daily. 60 tablet 5   gabapentin (NEURONTIN) 300 MG capsule Take 1 capsule (300 mg total) by mouth 2 (two) times daily. 180 capsule 0   glucose blood (FREESTYLE LITE) test strip USE TO CHECK BLOOD SUGAR 3 TIMES A DAY 200 strip 12   lamoTRIgine (LAMICTAL) 150 MG tablet Take 1 tablet (150 mg total) by mouth daily. 90 tablet 0   levocetirizine (XYZAL) 5 MG tablet Take 1 tablet (5 mg total) by mouth every evening. 30 tablet 2   lisinopril (ZESTRIL) 20 MG tablet TAKE 1 TABLET(20 MG) BY MOUTH DAILY 90 tablet 1   naloxone (NARCAN) nasal spray 4 mg/0.1 mL Place 1 spray into  the nose once.     omeprazole (PRILOSEC) 40 MG capsule Take 1 capsule (40 mg total) by mouth 2 (two) times daily. 90 capsule 0   ondansetron (ZOFRAN-ODT) 4 MG disintegrating tablet Take 1 tablet (4 mg total) by mouth every 8 (eight) hours as needed. 20 tablet 0   oxyCODONE-acetaminophen (PERCOCET) 10-325 MG tablet Take 1 tablet by mouth every 6 (six) hours as needed for pain.     Ubrogepant (UBRELVY) 50 MG TABS 1-2 tabs po once a day as needed for migraine headache 30 tablet 1   Vitamin D, Ergocalciferol, (DRISDOL) 1.25 MG (  50000 UNIT) CAPS capsule Take 1 capsule (50,000 Units total) by mouth 2 (two) times a week. 24 capsule 1   DULoxetine HCl 40 MG CPEP Take 1 capsule (40 mg) by mouth daily. (Patient not taking: Reported on 11/28/2023) 90 capsule 0   ferrous gluconate (FERGON) 324 MG tablet Take 1 tablet (324 mg total) by mouth daily with breakfast. (Patient not taking: Reported on 10/31/2023) 90 tablet 1   No facility-administered medications prior to visit.    Allergies  Allergen Reactions   Ibuprofen Nausea Only    Causes ulcers    Review of Systems As per HPI  PE:    11/28/2023    2:11 PM 11/21/2023   12:15 PM 11/17/2023    9:31 AM  Vitals with BMI  Height 5\' 6"     Weight 244 lbs 6 oz 233 lbs   BMI 39.47 37.63   Systolic 122  146  Diastolic 79  93  Pulse 74  74     Physical Exam  Gen: Alert, well appearing.  Patient is oriented to person, place, time, and situation. AFFECT: pleasant, lucid thought and speech. No further exam today  LABS:  Last CBC Lab Results  Component Value Date   WBC 8.0 06/12/2023   HGB 13.3 06/12/2023   HCT 41.5 06/12/2023   MCV 93.1 06/12/2023   MCH 29.4 08/16/2022   RDW 21.1 (H) 06/12/2023   PLT 358.0 06/12/2023   Lab Results  Component Value Date   IRON 42 05/16/2023   TIBC 371.0 05/16/2023   FERRITIN 34.7 05/16/2023   Last metabolic panel Lab Results  Component Value Date   GLUCOSE 97 06/12/2023   NA 138 06/12/2023   K 4.0  06/12/2023   CL 104 06/12/2023   CO2 24 06/12/2023   BUN 17 06/12/2023   CREATININE 1.23 (H) 06/12/2023   GFR 52.28 (L) 06/12/2023   CALCIUM 9.6 06/12/2023   PHOS 2.2 (L) 04/27/2021   PROT 7.2 06/12/2023   ALBUMIN 4.1 06/12/2023   BILITOT 0.3 06/12/2023   ALKPHOS 59 06/12/2023   AST 16 06/12/2023   ALT 10 06/12/2023   ANIONGAP 7 04/27/2021   Last lipids Lab Results  Component Value Date   CHOL 182 05/16/2023   HDL 78.00 05/16/2023   LDLCALC 78 05/16/2023   LDLDIRECT 122.0 03/12/2022   TRIG 130.0 05/16/2023   CHOLHDL 2 05/16/2023   Last hemoglobin A1c Lab Results  Component Value Date   HGBA1C 5.4 03/12/2022   Last thyroid functions Lab Results  Component Value Date   TSH 0.35 05/16/2023   Last vitamin D Lab Results  Component Value Date   VD25OH 31.65 05/16/2023   Last vitamin B12 and Folate Lab Results  Component Value Date   VITAMINB12 1,406 (H) 05/16/2023   FOLATE 17.0 03/22/2016   IMPRESSION AND PLAN:  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 Her most recent vitamin B12 injection was 4 days ago.  She does this every 2 weeks. She is unable to tolerate oral iron at all.   Check vitamin B12 level, CBC, iron panel today.   #2 vitamin D deficiency. She has required significantly high dose replacement long-term. Continue 50,000 unit tab twice a week. Check vitamin D level today.  Recent dose 4 days ago.   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 Will add a 1 mg Xanax tab at bedtime.  She will continue to take 2 of the  1 mg tabs in the morning and 2 in the afternoon.  No new prescription was needed today. When she requests new prescription I will send in one with these changed instructions and dispensation amount.   #4 hypertension.  Good control. Continue lisinopril 20 mg a day. Her serum creatinine was elevated at last check back in September 2024.  This  was checked in the setting of gastroenteritis and dehydration. Basic metabolic panel today.  An After Visit Summary was printed and given to the patient.  FOLLOW UP: Return in about 6 months (around 05/27/2024) for annual CPE (fasting).  Signed:  Santiago Bumpers, MD           11/28/2023

## 2023-11-29 LAB — BASIC METABOLIC PANEL
BUN: 21 mg/dL (ref 7–25)
CO2: 20 mmol/L (ref 20–32)
Calcium: 8.8 mg/dL (ref 8.6–10.2)
Chloride: 106 mmol/L (ref 98–110)
Creat: 0.75 mg/dL (ref 0.50–0.99)
Glucose, Bld: 84 mg/dL (ref 65–99)
Potassium: 5.4 mmol/L — ABNORMAL HIGH (ref 3.5–5.3)
Sodium: 138 mmol/L (ref 135–146)

## 2023-11-29 LAB — VITAMIN D 25 HYDROXY (VIT D DEFICIENCY, FRACTURES): Vit D, 25-Hydroxy: 57 ng/mL (ref 30–100)

## 2023-11-29 LAB — CBC
HCT: 36.6 % (ref 35.0–45.0)
Hemoglobin: 11.6 g/dL — ABNORMAL LOW (ref 11.7–15.5)
MCH: 27.7 pg (ref 27.0–33.0)
MCHC: 31.7 g/dL — ABNORMAL LOW (ref 32.0–36.0)
MCV: 87.4 fL (ref 80.0–100.0)
MPV: 11.8 fL (ref 7.5–12.5)
Platelets: 313 10*3/uL (ref 140–400)
RBC: 4.19 10*6/uL (ref 3.80–5.10)
RDW: 14.7 % (ref 11.0–15.0)
WBC: 8 10*3/uL (ref 3.8–10.8)

## 2023-11-29 LAB — IRON,TIBC AND FERRITIN PANEL
%SAT: 15 % — ABNORMAL LOW (ref 16–45)
Ferritin: 5 ng/mL — ABNORMAL LOW (ref 16–232)
Iron: 71 ug/dL (ref 40–190)
TIBC: 465 ug/dL — ABNORMAL HIGH (ref 250–450)

## 2023-11-29 LAB — VITAMIN B12: Vitamin B-12: 515 pg/mL (ref 200–1100)

## 2023-12-01 ENCOUNTER — Encounter: Payer: Self-pay | Admitting: Family Medicine

## 2023-12-01 NOTE — Telephone Encounter (Signed)
 Letter signed

## 2023-12-01 NOTE — Telephone Encounter (Signed)
 Last work note printed and placed on PCP desk for review, if any changes needed

## 2023-12-02 NOTE — Telephone Encounter (Signed)
 No further action needed.

## 2023-12-05 ENCOUNTER — Telehealth: Payer: Managed Care, Other (non HMO) | Admitting: Physician Assistant

## 2023-12-05 ENCOUNTER — Telehealth: Payer: Self-pay

## 2023-12-05 DIAGNOSIS — R6889 Other general symptoms and signs: Secondary | ICD-10-CM | POA: Diagnosis not present

## 2023-12-05 DIAGNOSIS — E875 Hyperkalemia: Secondary | ICD-10-CM

## 2023-12-05 MED ORDER — OSELTAMIVIR PHOSPHATE 75 MG PO CAPS: 75.0000 mg | ORAL_CAPSULE | Freq: Two times a day (BID) | ORAL | 0 refills | Status: DC

## 2023-12-05 NOTE — Telephone Encounter (Signed)
-----   Message from Jeoffrey Massed sent at 12/05/2023 12:05 PM EST ----- Please notify patient that her iron remains very low and this is causing mild anemia. Since that she is unable to tolerate any oral iron I recommend she get an iron infusion.  Please set this up. Also, her potassium is slightly elevated.   No medication changes right now but I do want her to come back for repeat basic metabolic panel in 3 to 5 days, diagnosis hyperkalemia.

## 2023-12-05 NOTE — Progress Notes (Signed)
 E visit for Flu like symptoms   We are sorry that you are not feeling well.  Here is how we plan to help! Based on what you have shared with me it looks like you may have a respiratory virus that may be influenza.  Influenza or "the flu" is   an infection caused by a respiratory virus. The flu virus is highly contagious and persons who did not receive their yearly flu vaccination may "catch" the flu from close contact.  We have anti-viral medications to treat the viruses that cause this infection. They are not a "cure" and only shorten the course of the infection. These prescriptions are most effective when they are given within the first 2 days of "flu" symptoms. Antiviral medication are indicated if you have a high risk of complications from the flu. You should  also consider an antiviral medication if you are in close contact with someone who is at risk. These medications can help patients avoid complications from the flu  but have side effects that you should know. Possible side effects from Tamiflu or oseltamivir include nausea, vomiting, diarrhea, dizziness, headaches, eye redness, sleep problems or other respiratory symptoms. You should not take Tamiflu if you have an allergy to oseltamivir or any to the ingredients in Tamiflu.  Based upon your symptoms and potential risk factors I have prescribed Oseltamivir (Tamiflu).  It has been sent to your designated pharmacy.  You will take one 75 mg capsule orally twice a day for the next 5 days.  A work note has been provided for you today. It will be available under "letters" in your MyChart account.   ANYONE WHO HAS FLU SYMPTOMS SHOULD: Stay home. The flu is highly contagious and going out or to work exposes others! Be sure to drink plenty of fluids. Water is fine as well as fruit juices, sodas and electrolyte beverages. You may want to stay away from caffeine or alcohol. If you are nauseated, try taking small sips of liquids. How do you know if  you are getting enough fluid? Your urine should be a pale yellow or almost colorless. Get rest. Taking a steamy shower or using a humidifier may help nasal congestion and ease sore throat pain. Using a saline nasal spray works much the same way. Cough drops, hard candies and sore throat lozenges may ease your cough. Line up a caregiver. Have someone check on you regularly.   GET HELP RIGHT AWAY IF: You cannot keep down liquids or your medications. You become short of breath Your fell like you are going to pass out or loose consciousness. Your symptoms persist after you have completed your treatment plan MAKE SURE YOU  Understand these instructions. Will watch your condition. Will get help right away if you are not doing well or get worse.  Your e-visit answers were reviewed by a board certified advanced clinical practitioner to complete your personal care plan.  Depending on the condition, your plan could have included both over the counter or prescription medications.  If there is a problem please reply  once you have received a response from your provider.  Your safety is important to [place].  If you have drug allergies check your prescription carefully.    You can use MyChart to ask questions about today's visit, request a non-urgent call back, or ask for a work or school excuse for 24 hours related to this e-Visit. If it has been greater than 24 hours you will need to follow up  with your provider, or enter a new e-Visit to address those concerns.  You will get an e-mail in the next two days asking about your experience.  I hope that your e-visit has been valuable and will speed your recovery. Thank you for using e-visits.    I have spent 5 minutes in review of e-visit questionnaire, review and updating patient chart, medical decision making and response to patient.   Margaretann Loveless, PA-C

## 2023-12-08 ENCOUNTER — Encounter: Payer: Self-pay | Admitting: Family Medicine

## 2023-12-08 DIAGNOSIS — D509 Iron deficiency anemia, unspecified: Secondary | ICD-10-CM

## 2023-12-08 NOTE — Telephone Encounter (Signed)
 Iron infusion referral placed, referral printed along with pt demographics and insurance info, all information faxed to [phone].

## 2023-12-09 ENCOUNTER — Telehealth: Payer: Self-pay | Admitting: Pharmacy Technician

## 2023-12-09 ENCOUNTER — Other Ambulatory Visit: Payer: Self-pay | Admitting: Family Medicine

## 2023-12-09 DIAGNOSIS — D509 Iron deficiency anemia, unspecified: Secondary | ICD-10-CM | POA: Insufficient documentation

## 2023-12-09 NOTE — Telephone Encounter (Signed)
 Auth Submission: NO AUTH NEEDED Site of care: Site of care: [facility] Payer: [employer]BS Medication & CPT/J Code(s) submitted: Venofer (Iron Sucrose) J1756 Route of submission (phone, fax, portal):  Phone # Fax # Auth type: Buy/Bill PB Units/visits requested: 5 DOSES Reference number:  Approval from: 12/09/23 to 05/10/24

## 2023-12-10 ENCOUNTER — Telehealth: Admitting: Physician Assistant

## 2023-12-10 ENCOUNTER — Encounter: Payer: Self-pay | Admitting: Family Medicine

## 2023-12-10 DIAGNOSIS — Z0289 Encounter for other administrative examinations: Secondary | ICD-10-CM

## 2023-12-10 NOTE — Progress Notes (Signed)
 Work note request from prior EV.

## 2023-12-10 NOTE — Telephone Encounter (Signed)
 OK. Pls do letter. thx

## 2023-12-11 ENCOUNTER — Encounter: Payer: Self-pay | Admitting: Family Medicine

## 2023-12-11 NOTE — Telephone Encounter (Signed)
 Please remind her that in the future I will can only do these letters if I actually see and treat her for the illness that requires work absence.

## 2023-12-11 NOTE — Telephone Encounter (Signed)
 Work note was given for missing work on 3/4 and 3/5. Pt is asking for extension.   Please advise if note can be provided

## 2023-12-11 NOTE — Telephone Encounter (Signed)
Letter okay? 

## 2023-12-12 ENCOUNTER — Other Ambulatory Visit

## 2023-12-12 ENCOUNTER — Other Ambulatory Visit (INDEPENDENT_AMBULATORY_CARE_PROVIDER_SITE_OTHER)

## 2023-12-12 DIAGNOSIS — Z79891 Long term (current) use of opiate analgesic: Secondary | ICD-10-CM | POA: Diagnosis not present

## 2023-12-12 DIAGNOSIS — E875 Hyperkalemia: Secondary | ICD-10-CM

## 2023-12-12 DIAGNOSIS — G894 Chronic pain syndrome: Secondary | ICD-10-CM | POA: Diagnosis not present

## 2023-12-12 DIAGNOSIS — M5416 Radiculopathy, lumbar region: Secondary | ICD-10-CM | POA: Diagnosis not present

## 2023-12-12 LAB — BASIC METABOLIC PANEL
BUN: 11 mg/dL (ref 6–23)
CO2: 23 meq/L (ref 19–32)
Calcium: 9.1 mg/dL (ref 8.4–10.5)
Chloride: 104 meq/L (ref 96–112)
Creatinine, Ser: 0.72 mg/dL (ref 0.40–1.20)
GFR: 99.07 mL/min (ref >60.00)
Glucose, Bld: 156 mg/dL — ABNORMAL HIGH (ref 70–99)
Potassium: 3.7 meq/L (ref 3.5–5.1)
Sodium: 139 meq/L (ref 135–145)

## 2023-12-14 ENCOUNTER — Encounter: Payer: Self-pay | Admitting: Family Medicine

## 2023-12-15 ENCOUNTER — Encounter: Payer: Self-pay | Admitting: Family Medicine

## 2023-12-15 MED ORDER — OMEPRAZOLE 40 MG PO CPDR: 40.0000 mg | DELAYED_RELEASE_CAPSULE | Freq: Two times a day (BID) | ORAL | 1 refills | Status: DC

## 2023-12-15 MED ORDER — FAMOTIDINE 20 MG PO TABS: 20.0000 mg | ORAL_TABLET | Freq: Two times a day (BID) | ORAL | 1 refills | Status: DC

## 2023-12-15 MED ORDER — ALPRAZOLAM 1 MG PO TABS: 2.0000 mg | ORAL_TABLET | Freq: Two times a day (BID) | ORAL | 5 refills | Status: DC

## 2023-12-15 NOTE — Telephone Encounter (Signed)
 FYI  Please see below

## 2023-12-15 NOTE — Telephone Encounter (Signed)
Please fill, if appropriate. Meds pending 

## 2023-12-16 ENCOUNTER — Telehealth: Payer: Self-pay

## 2023-12-16 NOTE — Telephone Encounter (Signed)
 Copied from CRM [id number]. Topic: General - Other >> Dec 16, 2023 10:50 AM Almira Coaster wrote: Reason for CRM: Donneta Romberg Benefit Admin with [facility] is calling to confirm that some letters that were received are correct and came from the providers office. She states that it should include the condition and how they are able to accommodate the patient when her symptoms occur. Best call back number is [phone].

## 2023-12-16 NOTE — Telephone Encounter (Signed)
 Please advise on requested changes

## 2023-12-22 ENCOUNTER — Ambulatory Visit (INDEPENDENT_AMBULATORY_CARE_PROVIDER_SITE_OTHER)

## 2023-12-22 VITALS — BP 121/86 | HR 76 | Temp 98.9°F | Resp 16 | Ht 66.0 in | Wt 237.0 lb

## 2023-12-22 DIAGNOSIS — D509 Iron deficiency anemia, unspecified: Secondary | ICD-10-CM | POA: Diagnosis not present

## 2023-12-22 MED ORDER — IRON SUCROSE 20 MG/ML IV SOLN
200.0000 mg | Freq: Once | INTRAVENOUS | Status: AC
Start: 2023-12-22 — End: 2023-12-22
  Administered 2023-12-22: 200 mg via INTRAVENOUS
  Filled 2023-12-22: qty 10

## 2023-12-22 NOTE — Patient Instructions (Signed)

## 2023-12-22 NOTE — Progress Notes (Signed)
 Diagnosis: Acute Anemia  Provider:  Chilton Greathouse MD  Procedure: IV Push  IV Type: Peripheral, IV Location: L Forearm  Venofer (Iron Sucrose), Dose: 200 mg  Post Infusion IV Care: Observation period completed and Peripheral IV Discontinued  Discharge: Condition: Good, Destination: Home . AVS Provided  Performed by:  Nat Math, RN

## 2023-12-23 ENCOUNTER — Encounter: Payer: Self-pay | Admitting: Family Medicine

## 2023-12-23 NOTE — Telephone Encounter (Signed)
 Okay for early fill today?

## 2023-12-24 ENCOUNTER — Ambulatory Visit

## 2023-12-24 VITALS — BP 121/85 | HR 82 | Temp 98.2°F | Resp 18 | Ht 66.0 in | Wt 238.8 lb

## 2023-12-24 DIAGNOSIS — D509 Iron deficiency anemia, unspecified: Secondary | ICD-10-CM | POA: Diagnosis not present

## 2023-12-24 MED ORDER — IRON SUCROSE 20 MG/ML IV SOLN
200.0000 mg | Freq: Once | INTRAVENOUS | Status: AC
  Administered 2023-12-24: 200 mg via INTRAVENOUS
  Filled 2023-12-24: qty 10

## 2023-12-24 NOTE — Telephone Encounter (Signed)
 Marlea, we have talked about this multiple times before. No early refills. This is the rule with this type of medication.

## 2023-12-24 NOTE — Progress Notes (Signed)
 Diagnosis: Iron Deficiency Anemia  Provider:  Chilton Greathouse MD  Procedure: IV Push  IV Type: Peripheral, IV Location: R Forearm  Venofer (Iron Sucrose), Dose: 200 mg  Post Infusion IV Care: Patient declined observation and Peripheral IV Discontinued  Discharge: Condition: Good, Destination: Home . AVS Declined  Performed by:  Adriana Mccallum, RN

## 2023-12-25 NOTE — Telephone Encounter (Signed)
 No further action needed.

## 2023-12-26 ENCOUNTER — Encounter

## 2023-12-26 ENCOUNTER — Other Ambulatory Visit: Payer: Self-pay

## 2023-12-26 MED ORDER — IRON SUCROSE 20 MG/ML IV SOLN: 200.0000 mg | Freq: Once | INTRAVENOUS | Status: DC

## 2023-12-26 MED ORDER — LISINOPRIL 20 MG PO TABS: 20.0000 mg | ORAL_TABLET | Freq: Every day | ORAL | 1 refills | Status: DC

## 2023-12-29 ENCOUNTER — Ambulatory Visit (INDEPENDENT_AMBULATORY_CARE_PROVIDER_SITE_OTHER)

## 2023-12-29 ENCOUNTER — Other Ambulatory Visit: Payer: Self-pay

## 2023-12-29 VITALS — BP 147/91 | HR 81 | Temp 97.8°F | Resp 16 | Ht 66.0 in | Wt 232.4 lb

## 2023-12-29 DIAGNOSIS — D509 Iron deficiency anemia, unspecified: Secondary | ICD-10-CM | POA: Diagnosis not present

## 2023-12-29 MED ORDER — IRON SUCROSE 20 MG/ML IV SOLN
200.0000 mg | Freq: Once | INTRAVENOUS | Status: AC
  Administered 2023-12-29: 200 mg via INTRAVENOUS
  Filled 2023-12-29: qty 10

## 2023-12-29 MED ORDER — LISINOPRIL 20 MG PO TABS: 20.0000 mg | ORAL_TABLET | Freq: Every day | ORAL | 1 refills | Status: DC

## 2023-12-29 NOTE — Progress Notes (Signed)
 Diagnosis: Iron Deficiency Anemia  Provider:  Chilton Greathouse MD  Procedure: IV Push  IV Type: Peripheral, IV Location: R Forearm  Venofer (Iron Sucrose), Dose: 200 mg  Post Infusion IV Care: Patient declined observation  Discharge: Condition: Good, Destination: Home . AVS Declined  Performed by:  Marilynn Rail, RN

## 2023-12-29 NOTE — Telephone Encounter (Signed)
 Pharmacy faxed stating they did not receive prior rx for Lisinopril. New rx re-sent.

## 2023-12-31 ENCOUNTER — Ambulatory Visit

## 2023-12-31 ENCOUNTER — Encounter: Payer: Self-pay | Admitting: Family Medicine

## 2023-12-31 MED ORDER — IRON SUCROSE 20 MG/ML IV SOLN: 200.0000 mg | Freq: Once | INTRAVENOUS | Status: DC

## 2024-01-02 ENCOUNTER — Ambulatory Visit

## 2024-01-05 ENCOUNTER — Other Ambulatory Visit: Payer: Self-pay

## 2024-01-05 MED ORDER — CYANOCOBALAMIN 1000 MCG/ML IJ SOLN: 1.0000 mg | INTRAMUSCULAR | 0 refills | Status: DC

## 2024-01-12 ENCOUNTER — Ambulatory Visit (INDEPENDENT_AMBULATORY_CARE_PROVIDER_SITE_OTHER)

## 2024-01-12 ENCOUNTER — Encounter: Payer: Self-pay | Admitting: Family Medicine

## 2024-01-12 ENCOUNTER — Telehealth: Payer: Self-pay

## 2024-01-12 VITALS — BP 111/76 | HR 65 | Temp 98.1°F | Resp 18 | Ht 66.0 in | Wt 240.0 lb

## 2024-01-12 DIAGNOSIS — D509 Iron deficiency anemia, unspecified: Secondary | ICD-10-CM | POA: Diagnosis not present

## 2024-01-12 MED ORDER — IRON SUCROSE 20 MG/ML IV SOLN
200.0000 mg | Freq: Once | INTRAVENOUS | Status: AC
  Administered 2024-01-12: 200 mg via INTRAVENOUS
  Filled 2024-01-12: qty 10

## 2024-01-12 NOTE — Progress Notes (Signed)
 Diagnosis: Iron Deficiency Anemia  Provider:  Chilton Greathouse MD  Procedure: IV Push  IV Type: Peripheral, IV Location: R Hand  Venofer (Iron Sucrose), Dose: 200 mg  Post Infusion IV Care: Patient declined observation and Peripheral IV Discontinued  Discharge: Condition: Good, Destination: Home . AVS Declined  Performed by:  Loney Hering, LPN

## 2024-01-12 NOTE — Telephone Encounter (Signed)
 Auth Submission: NO AUTH NEEDED - Insurance change Site of care: Site of care: [facility] Payer: [employer] Medicaid Medication & CPT/J Code(s) submitted: Venofer (Iron Sucrose) J1756 Route of submission (phone, fax, portal):  Phone # Fax # Auth type: Buy/Bill PB Units/visits requested: 200mg  x 2 doses remaining Reference number:  Approval from: 01/12/24 to 03/13/24

## 2024-01-14 ENCOUNTER — Ambulatory Visit

## 2024-01-14 VITALS — BP 119/78 | HR 65 | Temp 98.0°F | Resp 16 | Ht 66.0 in | Wt 240.0 lb

## 2024-01-14 DIAGNOSIS — D509 Iron deficiency anemia, unspecified: Secondary | ICD-10-CM | POA: Diagnosis not present

## 2024-01-14 MED ORDER — IRON SUCROSE 20 MG/ML IV SOLN
200.0000 mg | Freq: Once | INTRAVENOUS | Status: AC
  Administered 2024-01-14: 200 mg via INTRAVENOUS
  Filled 2024-01-14: qty 10

## 2024-01-14 NOTE — Progress Notes (Signed)
 Diagnosis: Acute Anemia  Provider:  Chilton Greathouse MD  Procedure: IV Push  IV Type: Peripheral, IV Location: L Antecubital  Venofer (Iron Sucrose), Dose: 200 mg  Post Infusion IV Care: Patient declined observation and Peripheral IV Discontinued  Discharge: Condition: Good, Destination: Home . AVS Declined  Performed by:  Nat Math, RN

## 2024-01-26 ENCOUNTER — Other Ambulatory Visit: Payer: Self-pay | Admitting: Pulmonary Disease

## 2024-01-26 ENCOUNTER — Other Ambulatory Visit: Payer: Self-pay | Admitting: Family Medicine

## 2024-01-27 MED ORDER — CYANOCOBALAMIN 1000 MCG/ML IJ SOLN: 1.0000 mg | INTRAMUSCULAR | 0 refills | Status: DC

## 2024-02-05 ENCOUNTER — Other Ambulatory Visit: Payer: Self-pay

## 2024-02-05 MED ORDER — VITAMIN D (ERGOCALCIFEROL) 1.25 MG (50000 UNIT) PO CAPS: 50000.0000 [IU] | ORAL_CAPSULE | ORAL | 1 refills | Status: AC

## 2024-02-06 ENCOUNTER — Telehealth (HOSPITAL_BASED_OUTPATIENT_CLINIC_OR_DEPARTMENT_OTHER): Admitting: Psychiatry

## 2024-02-06 ENCOUNTER — Encounter (HOSPITAL_COMMUNITY): Payer: Self-pay | Admitting: Psychiatry

## 2024-02-06 DIAGNOSIS — F331 Major depressive disorder, recurrent, moderate: Secondary | ICD-10-CM | POA: Diagnosis not present

## 2024-02-06 DIAGNOSIS — F429 Obsessive-compulsive disorder, unspecified: Secondary | ICD-10-CM | POA: Diagnosis not present

## 2024-02-06 DIAGNOSIS — F419 Anxiety disorder, unspecified: Secondary | ICD-10-CM

## 2024-02-06 MED ORDER — GABAPENTIN 300 MG PO CAPS: 300.0000 mg | ORAL_CAPSULE | Freq: Two times a day (BID) | ORAL | 0 refills | Status: DC

## 2024-02-06 MED ORDER — LAMOTRIGINE 150 MG PO TABS: 150.0000 mg | ORAL_TABLET | Freq: Every day | ORAL | 0 refills | Status: DC

## 2024-02-06 NOTE — Progress Notes (Signed)
  Health MD Virtual Progress Note   Patient Location: Work Provider Location: Home Office  I connect with patient by video and verified that I am speaking with correct person by using two identifiers. I discussed the limitations of evaluation and management by telemedicine and the availability of in person appointments. I also discussed with the patient that there may be a patient responsible charge related to this service. The patient expressed understanding and agreed to proceed.  Toni [name] [id number] 48 y.o.  02/06/2024 11:16 AM  History of Present Illness:  Patient is evaluated by video session however after a while due to deception it was converted to telephone visit.  Patient reported things are going okay.  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  She is now working at [employer].  She works in person.  She reports things are going okay.  She is happy that her son is accepted at [employer] in Public relations account executive.  Patient denies any irritability, anger, mania, psychosis or any hallucination.  She sleeps good.  Recently she had a blood work and labs are stable other than elevation of blood sugar but patient told she had blood work after she had a lunch.  She received auto infusion recently.  Overall she feels things are going well.  She like her new job which is laid back.  Her living situation is good.  She has no rash, itching, tremors or shakes.  She is compliant with Lamictal  and gabapentin .  She reported obsessive thoughts and anxiety is stable.  She denies any major panic attack.  She takes Xanax  prescribed by PCP.  Her appetite is okay and her weight is stable.  Past Psychiatric History: H/O depression after got married.  Tried Zoloft for years but stopped after seizure. PCP tried trazodone , Ambien, Doxepin  and Seroquel  but  did not work.  We tried  higher dose of Cymbalta  but developed side effects.  H/O irritability, poor impulse control with excessive buying and shopping but no history of mania, psychosis, suicidal attempt or inpatient treatment.     Outpatient Encounter Medications as of 02/06/2024  Medication Sig   ALPRAZolam  (XANAX ) 1 MG tablet Take 2 tablets (2 mg total) by mouth 2 (two) times daily.   Calcium  250 MG CAPS Take 1 capsule by mouth 3 (three) times daily.   cyanocobalamin  (VITAMIN B12) 1000 MCG/ML injection Inject 1 mL (1,000 mcg total) into the muscle every 2 weeks   ESTARYLLA  0.25-35 MG-MCG tablet Take 1 tablet by mouth daily.   famotidine  (PEPCID ) 20 MG tablet Take 1 tablet (20 mg total) by mouth 2 (two) times daily.   gabapentin  (NEURONTIN ) 300 MG capsule Take 1 capsule (300 mg total) by mouth 2 (two) times daily.   glucose blood (FREESTYLE LITE) test strip USE TO CHECK BLOOD SUGAR 3 TIMES A DAY   lamoTRIgine  (LAMICTAL ) 150 MG tablet Take 1 tablet (150 mg total) by mouth daily.   levocetirizine (XYZAL ) 5 MG tablet Take 1 tablet (5 mg total) by mouth every evening.   lisinopril  (ZESTRIL ) 20 MG tablet Take 1 tablet (20 mg total) by mouth daily.   naloxone (NARCAN) nasal spray 4 mg/0.1 mL Place 1 spray into the nose once.   omeprazole  (PRILOSEC) 40 MG capsule Take 1 capsule (40 mg total) by mouth 2 (two) times daily.   ondansetron  (ZOFRAN -ODT) 4 MG disintegrating  tablet Take 1 tablet (4 mg total) by mouth every 8 (eight) hours as needed.   oseltamivir  (TAMIFLU ) 75 MG capsule Take 1 capsule (75 mg total) by mouth 2 (two) times daily.   oxyCODONE -acetaminophen  (PERCOCET) 10-325 MG tablet Take 1 tablet by mouth every 6 (six) hours as needed for pain.   Ubrogepant  (UBRELVY ) 50 MG TABS 1-2 tabs po once a day as needed for migraine headache   Vitamin D , Ergocalciferol , (DRISDOL ) 1.25 MG (50000 UNIT) CAPS capsule Take 1 capsule (50,000 Units total) by mouth 2 (two) times a week.   No facility-administered encounter medications  on file as of 02/06/2024.    Recent Results (from the past 2160 hours)  Surgical pathology ([facility])     Status: None   Collection Time: 11/17/23 12:00 AM  Result Value Ref Range   SURGICAL PATHOLOGY      SURGICAL PATHOLOGY [facility] [address] [place], [place] [address] Telephone [phone] or [phone] Fax [phone]  REPORT OF SURGICAL PATHOLOGY   Accession #: [id number] Patient Name: Toni [name], Toni [name] Visit # : [id number]  MRN: [id number] Physician: Alvester Johnson DOB/Age [birth year] (Age: 89) Gender: F Collected Date: 11/17/2023 Received Date: 11/18/2023  FINAL DIAGNOSIS       1. 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       DATE SIGNED OUT: 11/19/2023 ELECTRONIC SIGNATURE : Earleen Glazier, John, Pathologist, Electronic Signature  MICROSCOPIC DESCRIPTION  CASE COMMENTS STAINS USED IN DIAGNOSIS: H&E    CLINICAL HISTORY  SPECIMEN(S) OBTAINED 1. Surgical [P], Colon, Transverse Polyp X 2, Polyp (2)  SPECIMEN COMMENTS: 1. Special screening for malignant neoplasms, colon; benign neoplasm of transverse colon SPECIMEN CLINICAL INFORMAT ION: 1. R/O adenoma    Gross Description 1. Received in formalin are tan, soft tissue fragments that are submitted in toto. Number: 2, Size: 0.4 cm smallest to 0.7 cm largest, (1B) ( TT )        Report signed out from the following location(s) [place] [address] [place], [place] [address] CLIA #: [id number]  [facility] [address] [place], [place] [address] CLIA #: [id number]   CBC     Status: Abnormal   Collection Time: 11/28/23  2:46 PM  Result Value Ref Range   WBC 8.0 3.8 - 10.8 Thousand/uL   RBC 4.19 3.80 - 5.10 Million/uL   Hemoglobin 11.6 (L) 11.7 - 15.5 g/dL   HCT 09.3 23.5 - 57.3 %   MCV 87.4 80.0 - 100.0 fL   MCH 27.7 27.0 - 33.0 pg    MCHC 31.7 (L) 32.0 - 36.0 g/dL    Comment: For adults, a slight decrease in the calculated MCHC value (in the range of 30 to 32 g/dL) is most likely not clinically significant; however, it should be interpreted with caution in correlation with other red cell parameters and the patient's clinical condition.    RDW 14.7 11.0 - 15.0 %   Platelets 313 140 - 400 Thousand/uL   MPV 11.8 7.5 - 12.5 fL  Iron , TIBC and Ferritin Panel     Status: Abnormal   Collection Time: 11/28/23  2:46 PM  Result Value Ref Range   Iron  71 40 - 190 mcg/dL   TIBC 220 (H) 250 - 450 mcg/dL (calc)   %SAT 15 (L) 16 - 45 % (calc)   Ferritin 5 (  L) 16 - 232 ng/mL  Basic metabolic panel     Status: Abnormal   Collection Time: 11/28/23  2:46 PM  Result Value Ref Range   Glucose, Bld 84 65 - 99 mg/dL    Comment: .            Fasting reference interval .    BUN 21 7 - 25 mg/dL   Creat 0.45 4.09 - 8.11 mg/dL   BUN/Creatinine Ratio SEE NOTE: 6 - 22 (calc)    Comment:    Not Reported: BUN and Creatinine are within    reference range. .    Sodium 138 135 - 146 mmol/L   Potassium 5.4 (H) 3.5 - 5.3 mmol/L   Chloride 106 98 - 110 mmol/L   CO2 20 20 - 32 mmol/L   Calcium  8.8 8.6 - 10.2 mg/dL  VITAMIN D  25 Hydroxy (Vit-D Deficiency, Fractures)     Status: None   Collection Time: 11/28/23  2:46 PM  Result Value Ref Range   Vit D, 25-Hydroxy 57 30 - 100 ng/mL    Comment: Vitamin D  Status         25-OH Vitamin D : . Deficiency:                    <20 ng/mL Insufficiency:             20 - 29 ng/mL Optimal:                 > or = 30 ng/mL . For 25-OH Vitamin D  testing on patients on  D2-supplementation and patients for whom quantitation  of D2 and D3 fractions is required, the [facility]ureD(TM) 25-OH VIT D, (D2,D3), LC/MS/MS is recommended: order  code 91478 (patients >74yrs). . See Note 1 . Note 1 . For additional information, please refer to  http://education.[facility].com/faq/FAQ199  (This link is  being provided for informational/ educational purposes only.)   Vitamin B12     Status: None   Collection Time: 11/28/23  2:46 PM  Result Value Ref Range   Vitamin B-12 515 200 - 1,100 pg/mL  Basic Metabolic Panel (BMET)     Status: Abnormal   Collection Time: 12/12/23  1:23 PM  Result Value Ref Range   Sodium 139 135 - 145 mEq/L   Potassium 3.7 3.5 - 5.1 mEq/L   Chloride 104 96 - 112 mEq/L   CO2 23 19 - 32 mEq/L   Glucose, Bld 156 (H) 70 - 99 mg/dL   BUN 11 6 - 23 mg/dL   Creatinine, Ser 2.95 0.40 - 1.20 mg/dL   GFR 62.13 >08.65 mL/min    Comment: Calculated using the CKD-EPI Creatinine Equation (2021)   Calcium  9.1 8.4 - 10.5 mg/dL     Psychiatric Specialty Exam: Physical Exam  Review of Systems  Weight 240 lb (108.9 kg).There is no height or weight on file to calculate BMI.  General Appearance: Casual  Eye Contact:  Good  Speech:  Clear and Coherent  Volume:  Normal  Mood:  Euthymic  Affect:  Congruent  Thought Process:  Goal Directed  Orientation:  Full (Time, Place, and Person)  Thought Content:  Logical  Suicidal Thoughts:  No  Homicidal Thoughts:  No  Memory:  Immediate;   Good Recent;   Good Remote;   Good  Judgement:  Good  Insight:  Shallow  Psychomotor Activity:  Normal  Concentration:  Concentration: Good and Attention Span: Good  Recall:  Good  Fund of Knowledge:  Good  Language:  Good  Akathisia:  No  Handed:  Right  AIMS (if indicated):     Assets:  Communication Skills Desire for Improvement Housing Social Support Talents/Skills Transportation  ADL's:  Intact  Cognition:  WNL  Sleep:  ok       12/22/2023    3:24 PM 11/28/2023    2:12 PM 05/16/2023    1:42 PM 11/24/2020    9:07 AM 05/15/2020    9:08 AM  Depression screen PHQ 2/9  Decreased Interest 0 1 1 2 1   Down, Depressed, Hopeless 0 1 1 1 1   PHQ - 2 Score 0 2 2 3 2   Altered sleeping  1 0 2 1  Tired, decreased energy  1 1 2  0  Change in appetite  0 2 1 3   Feeling bad or failure  about yourself   0 0 2 0  Trouble concentrating  0 0 1 2  Moving slowly or fidgety/restless  0 0 0 0  Suicidal thoughts  0 0 0 0  PHQ-9 Score  4 5 11 8   Difficult doing work/chores  Somewhat difficult Somewhat difficult Not difficult at all Extremely dIfficult    Assessment/Plan: Anxiety - Plan: gabapentin  (NEURONTIN ) 300 MG capsule  MDD (major depressive disorder), recurrent episode, moderate (HCC) - Plan: gabapentin  (NEURONTIN ) 300 MG capsule, lamoTRIgine  (LAMICTAL ) 150 MG tablet  Obsessive-compulsive disorder, unspecified type - Plan: gabapentin  (NEURONTIN ) 300 MG capsule  Patient doing better on her current medication.  Her anxiety depression and OCD is stable.  She has no rash or any itching.  She is not interested in therapy.  Continue Lamictal  150 mg daily, gabapentin  300 mg twice a day.  I recommend if she continue to have a suction issue at her workplace then we will consider in person visit.  Patient acknowledged and she will try to find a place next time where she can get the better reception for the visit.  Follow-up in 3 months.   Follow Up Instructions:     I discussed the assessment and treatment plan with the patient. The patient was provided an opportunity to ask questions and all were answered. The patient agreed with the plan and demonstrated an understanding of the instructions.   The patient was advised to call back or seek an in-person evaluation if the symptoms worsen or if the condition fails to improve as anticipated.    Collaboration of Care: Other provider involved in patient's care AEB notes are available in epic to review  Patient/Guardian was advised Release of Information must be obtained prior to any record release in order to collaborate their care with an outside provider. Patient/Guardian was advised if they have not already done so to contact the registration department to sign all necessary forms in order for us  to release information regarding their  care.   Consent: Patient/Guardian gives verbal consent for treatment and assignment of benefits for services provided during this visit. Patient/Guardian expressed understanding and agreed to proceed.     Total encounter time 21 minutes which includes face-to-face time, chart reviewed, care coordination, order entry and documentation during this encounter.   Note: This document was prepared by [employer] voice dictation technology and any errors that results from this process are unintentional.    Arturo Late, MD 02/06/2024

## 2024-02-20 ENCOUNTER — Encounter: Payer: Self-pay | Admitting: Family Medicine

## 2024-02-20 NOTE — Telephone Encounter (Signed)
 Please call pharmacy and give authorization to fill her Xanax  prescription today.

## 2024-02-20 NOTE — Telephone Encounter (Signed)
 Provider has been sent a message regarding medication.

## 2024-03-02 ENCOUNTER — Other Ambulatory Visit (HOSPITAL_COMMUNITY): Payer: Self-pay | Admitting: Psychiatry

## 2024-03-02 DIAGNOSIS — F419 Anxiety disorder, unspecified: Secondary | ICD-10-CM

## 2024-03-02 DIAGNOSIS — F429 Obsessive-compulsive disorder, unspecified: Secondary | ICD-10-CM

## 2024-03-02 DIAGNOSIS — F331 Major depressive disorder, recurrent, moderate: Secondary | ICD-10-CM

## 2024-03-04 ENCOUNTER — Encounter: Payer: Self-pay | Admitting: Family Medicine

## 2024-03-04 DIAGNOSIS — R1110 Vomiting, unspecified: Secondary | ICD-10-CM

## 2024-03-05 ENCOUNTER — Encounter: Payer: Self-pay | Admitting: Family Medicine

## 2024-03-05 ENCOUNTER — Other Ambulatory Visit (HOSPITAL_COMMUNITY): Payer: Self-pay

## 2024-03-05 MED ORDER — METRONIDAZOLE 500 MG PO TABS
500.0000 mg | ORAL_TABLET | Freq: Two times a day (BID) | ORAL | 0 refills | Status: DC
  Filled 2024-03-05 – 2024-03-06 (×2): qty 14, 7d supply, fill #0

## 2024-03-05 MED ORDER — FLUCONAZOLE 150 MG PO TABS
150.0000 mg | ORAL_TABLET | Freq: Once | ORAL | 2 refills | Status: AC
  Filled 2024-03-05: qty 1, 3d supply, fill #0
  Filled 2024-03-06: qty 1, 1d supply, fill #0

## 2024-03-05 MED ORDER — ONDANSETRON 4 MG PO TBDP
4.0000 mg | ORAL_TABLET | Freq: Three times a day (TID) | ORAL | 1 refills | Status: DC | PRN
Start: 2024-03-05 — End: 2024-06-08

## 2024-03-05 NOTE — Telephone Encounter (Signed)
 Pt was advised. See other mychart encounter

## 2024-03-05 NOTE — Telephone Encounter (Signed)
 Rx sent.

## 2024-03-06 ENCOUNTER — Other Ambulatory Visit (HOSPITAL_COMMUNITY): Payer: Self-pay

## 2024-03-08 ENCOUNTER — Other Ambulatory Visit (HOSPITAL_COMMUNITY): Payer: Self-pay

## 2024-04-20 ENCOUNTER — Encounter: Payer: Self-pay | Admitting: Family Medicine

## 2024-04-21 NOTE — Telephone Encounter (Signed)
 Ok for referral but you can also offer appt with me for toenail removal.

## 2024-04-22 ENCOUNTER — Other Ambulatory Visit: Payer: Self-pay

## 2024-04-22 DIAGNOSIS — L600 Ingrowing nail: Secondary | ICD-10-CM

## 2024-05-07 ENCOUNTER — Telehealth (HOSPITAL_COMMUNITY): Admitting: Psychiatry

## 2024-05-07 ENCOUNTER — Encounter (HOSPITAL_COMMUNITY): Payer: Self-pay

## 2024-05-28 ENCOUNTER — Encounter: Payer: Managed Care, Other (non HMO) | Admitting: Family Medicine

## 2024-06-04 ENCOUNTER — Ambulatory Visit (INDEPENDENT_AMBULATORY_CARE_PROVIDER_SITE_OTHER): Payer: PRIVATE HEALTH INSURANCE | Admitting: Family Medicine

## 2024-06-04 ENCOUNTER — Encounter: Payer: Self-pay | Admitting: Family Medicine

## 2024-06-04 VITALS — BP 110/77 | HR 91 | Temp 97.9°F | Ht 65.5 in | Wt 245.4 lb

## 2024-06-04 DIAGNOSIS — I10 Essential (primary) hypertension: Secondary | ICD-10-CM

## 2024-06-04 DIAGNOSIS — Z0000 Encounter for general adult medical examination without abnormal findings: Secondary | ICD-10-CM

## 2024-06-04 DIAGNOSIS — E559 Vitamin D deficiency, unspecified: Secondary | ICD-10-CM

## 2024-06-04 DIAGNOSIS — D508 Other iron deficiency anemias: Secondary | ICD-10-CM | POA: Diagnosis not present

## 2024-06-04 DIAGNOSIS — Z79899 Other long term (current) drug therapy: Secondary | ICD-10-CM

## 2024-06-04 DIAGNOSIS — F411 Generalized anxiety disorder: Secondary | ICD-10-CM

## 2024-06-04 DIAGNOSIS — E162 Hypoglycemia, unspecified: Secondary | ICD-10-CM

## 2024-06-04 DIAGNOSIS — K909 Intestinal malabsorption, unspecified: Secondary | ICD-10-CM

## 2024-06-04 MED ORDER — OMEPRAZOLE 40 MG PO CPDR: 40.0000 mg | DELAYED_RELEASE_CAPSULE | Freq: Two times a day (BID) | ORAL | 1 refills | Status: DC

## 2024-06-04 MED ORDER — LISINOPRIL 20 MG PO TABS: 20.0000 mg | ORAL_TABLET | Freq: Every day | ORAL | 1 refills | Status: DC

## 2024-06-04 MED ORDER — FERROUS FUMARATE 324 (106 FE) MG PO TABS: 1.0000 | ORAL_TABLET | Freq: Every day | ORAL | 6 refills | Status: DC

## 2024-06-04 MED ORDER — FAMOTIDINE 20 MG PO TABS: 20.0000 mg | ORAL_TABLET | Freq: Two times a day (BID) | ORAL | 1 refills | Status: DC

## 2024-06-04 NOTE — Progress Notes (Signed)
 Office Note 06/04/2024  CC:  Chief Complaint  Patient presents with   Annual Exam    Patient is a 48 y.o. 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 Her most recent vitamin B12 injection was 4 days ago.  She does this every 2 weeks. She is unable to tolerate oral iron  at all.   Check vitamin B12 level, CBC, iron  panel today.   #2 vitamin D  deficiency. She has required significantly high dose replacement long-term. Continue 50,000 unit tab twice a week. Check vitamin D  level today.  Recent dose 4 days ago.   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 Will add a 1 mg Xanax  tab at bedtime.  She will continue to take 2 of the 1 mg tabs in the morning and 2 in the afternoon.  No new prescription was needed today. When she requests new prescription I will send in one with these changed instructions and dispensation amount.   #4 hypertension.  Good control. Continue lisinopril  20 mg a day. Her serum creatinine was elevated at last check back in September 2024.  This was checked in the setting of gastroenteritis and dehydration. Basic metabolic panel today.  INTERIM HX: Doing ok. Tolerating oral iron  supp 2d/week.    PMP AWARE reviewed today: most recent rx for alprazolam  was filled 05/15/24, # 100, rx by me. No red flags.   Past Medical History:  Diagnosis Date   Acute renal failure (HCC) 04/26/2021   prerenal azotemia   Allergic rhinitis    Altered sensorium due to hypoglycemia 03/2016   Anxiety and depression    started with PPD   Chronic neck pain    Myofascial pain syndrome per [facility], oxycodone  per their office pain med contract.  (Normal cervical MRI 2010 per ortho notes).   Easy  bruisability 2011   Hx of: saw Hematologist (Dr. Freddie) and w/u neg for von Willebrand desease.   GERD (gastroesophageal reflux disease)    hx gastric ulcer and upper GI bleed, ? 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 Pt to start FeSO4 325 qd 10/28/19   Irregular menses 03/2018   GYN started [facility]  0.25-0.35.  [facility] as of 10/2018.  u/s wnl.  Endom bx NEG. OCPs helped.   Malabsorption syndrome    due to roux-en-Y   Medial epicondylitis of elbow, left 12/2019   [facility]   Migraine    Mixed hyperlipidemia    Osteoarthritis of carpometacarpal [facility]) joint of right thumb 12/2019   [facility]   Reactive hypoglycemia    Dr. Kassie   Seizure [facility]) 2015   s/p eval with neuro, no medication, no events since   Syncope    with ? seizure in 2015, eval with neruo   Vitamin B12 deficiency    Vitamin D  deficiency     Past Surgical History:  Procedure Laterality Date   CESAREAN SECTION  2006   COLONOSCOPY W/ POLYPECTOMY     adenomas 11/17/23-->recall 7 yrs   ELBOW X-RAY Left 12/07/2019   ENDOMETRIAL BIOPSY  EYE SURGERY Bilateral    2001   FINGER X-RAY Right 12/07/2019   ROUX-EN-Y GASTRIC BYPASS  01/05/2002   TONSILLECTOMY AND ADENOIDECTOMY     TUBAL LIGATION  2006   tubes in ears both Bilateral     Family History  Problem Relation Age of Onset   Arthritis Mother    Hyperlipidemia Mother    Heart disease Mother    Diabetes Mother    Asthma Mother    Aneurysm Father        deceased   Lung cancer Maternal Aunt        smoker   Lung cancer Maternal Aunt        smoker   Colon cancer Maternal Grandmother    Esophageal cancer Neg Hx    Rectal cancer Neg Hx    Stomach cancer Neg Hx     Social History   Socioeconomic History   Marital status: Legally Separated    Spouse name: Not on  file   Number of children: Not on file   Years of education: Not on file   Highest education level: Associate degree: occupational, Scientist, product/process development, or vocational program  Occupational History   Not on file  Tobacco Use   Smoking status: Never   Smokeless tobacco: Never  Vaping Use   Vaping status: Never Used  Substance and Sexual Activity   Alcohol  use: Yes    Alcohol /week: 0.0 standard drinks of alcohol     Comment: occasional    Drug use: No   Sexual activity: Not on file  Other Topics Concern   Not on file  Social History Narrative   Married.   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   No T/A/Ds.   Social Drivers of Health   Financial Resource Strain: High Risk (12/07/2021)   Overall Financial Resource Strain (CARDIA)    Difficulty of Paying Living Expenses: Hard  Food Insecurity: Food Insecurity Present (12/07/2021)   Hunger Vital Sign    Worried About Running Out of Food in the Last Year: Sometimes true    Ran Out of Food in the Last Year: Sometimes true  Transportation Needs: Unknown (12/07/2021)   PRAPARE - Administrator, Civil Service (Medical): Not on file    Lack of Transportation (Non-Medical): No  Physical Activity: Insufficiently Active (12/07/2021)   Exercise Vital Sign    Days of Exercise per Week: 3 days    Minutes of Exercise per Session: 30 min  Stress: Stress Concern Present (12/07/2021)   [employer] of Occupational Health - Occupational Stress Questionnaire    Feeling of Stress : Very much  Social Connections: Socially Isolated (12/07/2021)   Social Connection and Isolation Panel    Frequency of Communication with Friends and Family: Three times a week    Frequency of Social Gatherings with Friends and Family: Three times a week    Attends Religious Services: Never    Active Member of Clubs or Organizations: No    Attends Engineer, structural: Not on file    Marital Status: Divorced  Intimate Partner Violence: Not on file     Outpatient Medications Prior to Visit  Medication Sig Dispense Refill   ALPRAZolam  (XANAX ) 1 MG tablet Take 2 tablets (2 mg total) by mouth 2 (two) times daily. 120 tablet 5   Calcium  250 MG CAPS Take 1 capsule by mouth 3 (three) times daily.     cyanocobalamin  (VITAMIN B12) 1000 MCG/ML injection Inject 1 mL (1,000 mcg total) into  the muscle every 2 weeks 10 mL 0   ESTARYLLA  0.25-35 MG-MCG tablet Take 1 tablet by mouth daily.     famotidine  (PEPCID ) 20 MG tablet Take 1 tablet (20 mg total) by mouth 2 (two) times daily. 180 tablet 1   gabapentin  (NEURONTIN ) 300 MG capsule Take 1 capsule (300 mg total) by mouth 2 (two) times daily. 180 capsule 0   glucose blood (FREESTYLE LITE) test strip USE TO CHECK BLOOD SUGAR 3 TIMES A DAY 200 strip 12   lamoTRIgine  (LAMICTAL ) 150 MG tablet Take 1 tablet (150 mg total) by mouth daily. 90 tablet 0   levocetirizine (XYZAL ) 5 MG tablet Take 1 tablet (5 mg total) by mouth every evening. 30 tablet 2   lisinopril  (ZESTRIL ) 20 MG tablet Take 1 tablet (20 mg total) by mouth daily. 90 tablet 1   metroNIDAZOLE  (FLAGYL ) 500 MG tablet Take 1 tablet (500 mg total) by mouth 2 (two) times daily for 7 days. 14 tablet 0   naloxone (NARCAN) nasal spray 4 mg/0.1 mL Place 1 spray into the nose once.     omeprazole  (PRILOSEC) 40 MG capsule Take 1 capsule (40 mg total) by mouth 2 (two) times daily. 180 capsule 1   ondansetron  (ZOFRAN -ODT) 4 MG disintegrating tablet Take 1 tablet (4 mg total) by mouth every 8 (eight) hours as needed. 20 tablet 1   oxyCODONE -acetaminophen  (PERCOCET) 10-325 MG tablet Take 1 tablet by mouth every 6 (six) hours as needed for pain.     Ubrogepant  (UBRELVY ) 50 MG TABS 1-2 tabs po once a day as needed for migraine headache 30 tablet 1   Vitamin D , Ergocalciferol , (DRISDOL ) 1.25 MG (50000 UNIT) CAPS capsule Take 1 capsule (50,000 Units total) by mouth 2 (two) times a week. 24 capsule 1   No facility-administered medications prior to visit.     Allergies  Allergen Reactions   Ibuprofen Nausea Only    Causes ulcers    Review of Systems  Constitutional:  Negative for appetite change, chills, fatigue and fever.  HENT:  Negative for congestion, dental problem, ear pain and sore throat.   Eyes:  Negative for discharge, redness and visual disturbance.  Respiratory:  Negative for cough, chest tightness, shortness of breath and wheezing.   Cardiovascular:  Negative for chest pain, palpitations and leg swelling.  Gastrointestinal:  Negative for abdominal pain, blood in stool, diarrhea, nausea and vomiting.  Genitourinary:  Negative for difficulty urinating, dysuria, flank pain, frequency, hematuria and urgency.  Musculoskeletal:  Negative for arthralgias, back pain, joint swelling, myalgias and neck stiffness.  Skin:  Negative for pallor and rash.  Neurological:  Negative for dizziness, speech difficulty, weakness and headaches.  Hematological:  Negative for adenopathy. Does not bruise/bleed easily.  Psychiatric/Behavioral:  Negative for confusion and sleep disturbance. The patient is not nervous/anxious.     PE;    06/04/2024    2:40 PM 02/06/2024   11:35 AM 01/14/2024    2:18 PM  Vitals with BMI  Height 5' 5.5    Weight 245 lbs 6 oz    BMI 40.2    Systolic 110  119  Diastolic 77  78  Pulse 91  65     Information is confidential and restricted. Go to Review Flowsheets to unlock data.   Exam chaperoned by [employer], CMA Gen: Alert, well appearing.  Patient is oriented to person, place, time, and situation. AFFECT: pleasant, lucid thought and speech. ENT: Ears: EACs clear, normal epithelium.  TMs with good light  reflex and landmarks bilaterally.  Eyes: no injection, icteris, swelling, or exudate.  EOMI, PERRLA. Nose: no drainage or turbinate edema/swelling.  No injection or focal lesion.  Mouth: lips without lesion/swelling.  Oral mucosa pink and moist.  Dentition intact and without obvious caries or gingival swelling.   Oropharynx without erythema, exudate, or swelling.  Neck: supple/nontender.  No LAD, mass, or TM.  Carotid pulses 2+ bilaterally, without bruits. CV: RRR, no m/r/g.   LUNGS: CTA bilat, nonlabored resps, good aeration in all lung fields. ABD: soft, NT, ND, BS normal.  No hepatospenomegaly or mass.  No bruits. EXT: no clubbing, cyanosis, or edema.  Musculoskeletal: no joint swelling, erythema, warmth, or tenderness.  ROM of all joints intact. Skin - no sores or suspicious lesions or rashes or color changes  Pertinent labs:  Lab Results  Component Value Date   TSH 0.35 05/16/2023   Lab Results  Component Value Date   WBC 8.0 11/28/2023   HGB 11.6 (L) 11/28/2023   HCT 36.6 11/28/2023   MCV 87.4 11/28/2023   PLT 313 11/28/2023   Lab Results  Component Value Date   IRON  71 11/28/2023   TIBC 465 (H) 11/28/2023   FERRITIN 5 (L) 11/28/2023   Lab Results  Component Value Date   VITAMINB12 515 11/28/2023   Lab Results  Component Value Date   CREATININE 0.72 12/12/2023   BUN 11 12/12/2023   NA 139 12/12/2023   K 3.7 12/12/2023   CL 104 12/12/2023   CO2 23 12/12/2023   Lab Results  Component Value Date   ALT 10 06/12/2023   AST 16 06/12/2023   ALKPHOS 59 06/12/2023   BILITOT 0.3 06/12/2023   Lab Results  Component Value Date   CHOL 182 05/16/2023   Lab Results  Component Value Date   HDL 78.00 05/16/2023   Lab Results  Component Value Date   LDLCALC 78 05/16/2023   Lab Results  Component Value Date   TRIG 130.0 05/16/2023   Lab Results  Component Value Date   CHOLHDL 2 05/16/2023   Lab Results  Component Value Date   HGBA1C 5.4 03/12/2022   ASSESSMENT AND PLAN:   #1 health maintenance exam: Reviewed age and gender appropriate health maintenance issues (prudent diet, regular exercise, health risks of tobacco and excessive alcohol , use of seatbelts, fire alarms in home, use of sunscreen).  Also reviewed age and gender appropriate health screening as well  as vaccine recommendations. Vaccines: UTD Labs: lipid, TSH, cbc,cmet,vit b12, vit D, iron . Cervical ca screening: per gyn (Dr. Armond with [facility] Breast ca screening: per gyn. Colon ca screening: -> colonoscopy this year showed 2 adenomas. Recall was recommended 2032.  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 She had subsequent malabsorption of iron  and B12. Continue home B12 injections every 2 weeks. Continue iron  intake the best she can, sent in prescription for ferrous fumarate  324 mg today. Check vitamin B12 level, CBC, iron  panel today.   #3 vitamin D  deficiency. She has required significantly high dose replacement long-term. Continue 50,000 unit tab twice a week. Check vitamin D  level today.     #4 generalized anxiety disorder with anxiety-related insomnia. Continue 2 of the 1 mg Xanax  tabs twice a day. She does get followed by a psychiatrist and is prescribed Lamictal  150 mg daily. Controlled substance contract updated. Urine drug screen obtained today.  #5 hypertension.  Good control. Continue lisinopril  20 mg a day. Electrolytes and creatinine today.  An After Visit Summary was printed and given to the patient.  FOLLOW UP:  No follow-ups on file.  Signed:  Gerlene Hockey, MD           06/04/2024

## 2024-06-07 ENCOUNTER — Ambulatory Visit: Payer: Self-pay | Admitting: Family Medicine

## 2024-06-07 LAB — DRUG MONITORING, PANEL 8 WITH CONFIRMATION, URINE
6 Acetylmorphine: NEGATIVE ng/mL (ref <10)
6 Acetylmorphine: NEGATIVE ng/mL (ref <10)
Alcohol Metabolites: NEGATIVE ng/mL (ref <500)
Alphahydroxyalprazolam: 176 ng/mL — ABNORMAL HIGH (ref <25)
Alphahydroxymidazolam: NEGATIVE ng/mL (ref <50)
Alphahydroxytriazolam: NEGATIVE ng/mL (ref <50)
Aminoclonazepam: NEGATIVE ng/mL (ref <25)
Amphetamines: NEGATIVE ng/mL (ref <500)
Benzodiazepines: POSITIVE ng/mL — AB (ref <100)
Buprenorphine, Urine: POSITIVE ng/mL — AB (ref <5)
Buprenorphine: 2 ng/mL — ABNORMAL HIGH (ref <2)
Cocaine Metabolite: NEGATIVE ng/mL (ref <150)
Codeine: NEGATIVE ng/mL (ref <50)
Creatinine: 275 mg/dL (ref >=20.0)
Hydrocodone: NEGATIVE ng/mL (ref <50)
Hydromorphone: NEGATIVE ng/mL (ref <50)
Hydroxyethylflurazepam: NEGATIVE ng/mL (ref <50)
Lorazepam: NEGATIVE ng/mL (ref <50)
MDMA: NEGATIVE ng/mL (ref <500)
Marijuana Metabolite: NEGATIVE ng/mL (ref <20)
Morphine: NEGATIVE ng/mL (ref <50)
Naloxone: NEGATIVE ng/mL (ref <2)
Norbuprenorphine: 15 ng/mL — ABNORMAL HIGH (ref <2)
Nordiazepam: NEGATIVE ng/mL (ref <50)
Norhydrocodone: NEGATIVE ng/mL (ref <50)
Noroxycodone: >10000 ng/mL — ABNORMAL HIGH (ref <50)
Opiates: NEGATIVE ng/mL (ref <100)
Oxazepam: NEGATIVE ng/mL (ref <50)
Oxidant: NEGATIVE ug/mL (ref <200)
Oxycodone: >10000 ng/mL — ABNORMAL HIGH (ref <50)
Oxycodone: POSITIVE ng/mL — AB (ref <100)
Oxymorphone: 273 ng/mL — ABNORMAL HIGH (ref <50)
Temazepam: NEGATIVE ng/mL (ref <50)
pH: 5.8 (ref 4.5–9.0)

## 2024-06-07 LAB — CBC WITH DIFFERENTIAL/PLATELET
Absolute Lymphocytes: 3920 {cells}/uL — ABNORMAL HIGH (ref 850–3900)
Absolute Monocytes: 575 {cells}/uL (ref 200–950)
Basophils Absolute: 57 {cells}/uL (ref 0–200)
Basophils Relative: 0.7 %
Eosinophils Absolute: 235 {cells}/uL (ref 15–500)
Eosinophils Relative: 2.9 %
HCT: 45.3 % — ABNORMAL HIGH (ref 35.0–45.0)
Hemoglobin: 14.3 g/dL (ref 11.7–15.5)
MCH: 31.4 pg (ref 27.0–33.0)
MCHC: 31.6 g/dL — ABNORMAL LOW (ref 32.0–36.0)
MCV: 99.6 fL (ref 80.0–100.0)
MPV: 10.8 fL (ref 7.5–12.5)
Monocytes Relative: 7.1 %
Neutro Abs: 3313 {cells}/uL (ref 1500–7800)
Neutrophils Relative %: 40.9 %
Platelets: 360 Thousand/uL (ref 140–400)
RBC: 4.55 Million/uL (ref 3.80–5.10)
RDW: 13.4 % (ref 11.0–15.0)
Total Lymphocyte: 48.4 %
WBC: 8.1 Thousand/uL (ref 3.8–10.8)

## 2024-06-07 LAB — COMPREHENSIVE METABOLIC PANEL WITH GFR
AG Ratio: 1.8 (calc) (ref 1.0–2.5)
ALT: 9 U/L (ref 6–29)
AST: 11 U/L (ref 10–35)
Albumin: 4 g/dL (ref 3.6–5.1)
Alkaline phosphatase (APISO): 68 U/L (ref 31–125)
BUN: 19 mg/dL (ref 7–25)
CO2: 25 mmol/L (ref 20–32)
Calcium: 8.9 mg/dL (ref 8.6–10.2)
Chloride: 104 mmol/L (ref 98–110)
Creat: 0.88 mg/dL (ref 0.50–0.99)
Globulin: 2.2 g/dL (ref 1.9–3.7)
Glucose, Bld: 55 mg/dL — ABNORMAL LOW (ref 65–99)
Potassium: 4.2 mmol/L (ref 3.5–5.3)
Sodium: 137 mmol/L (ref 135–146)
Total Bilirubin: 0.3 mg/dL (ref 0.2–1.2)
Total Protein: 6.2 g/dL (ref 6.1–8.1)
eGFR: 81 mL/min/1.73m2 (ref >=60)

## 2024-06-07 LAB — VITAMIN B12: Vitamin B-12: 584 pg/mL (ref 200–1100)

## 2024-06-07 LAB — VITAMIN D 25 HYDROXY (VIT D DEFICIENCY, FRACTURES): Vit D, 25-Hydroxy: 65 ng/mL (ref 30–100)

## 2024-06-07 LAB — DM TEMPLATE

## 2024-06-07 LAB — HEMOGLOBIN A1C
Hgb A1c MFr Bld: 5 % (ref <5.7)
Mean Plasma Glucose: 97 mg/dL
eAG (mmol/L): 5.4 mmol/L

## 2024-06-07 LAB — IRON,TIBC AND FERRITIN PANEL
%SAT: 22 % (ref 16–45)
Ferritin: 49 ng/mL (ref 16–232)
Iron: 71 ug/dL (ref 40–190)
TIBC: 322 ug/dL (ref 250–450)

## 2024-06-07 LAB — LIPID PANEL
Cholesterol: 269 mg/dL — ABNORMAL HIGH (ref <200)
HDL: 81 mg/dL (ref >=50)
LDL Cholesterol (Calc): 149 mg/dL — ABNORMAL HIGH
Non-HDL Cholesterol (Calc): 188 mg/dL — ABNORMAL HIGH (ref <130)
Total CHOL/HDL Ratio: 3.3 (calc) (ref <5.0)
Triglycerides: 243 mg/dL — ABNORMAL HIGH (ref <150)

## 2024-06-07 LAB — TSH: TSH: 1.04 m[IU]/L

## 2024-06-08 ENCOUNTER — Encounter: Payer: Self-pay | Admitting: Family Medicine

## 2024-06-08 DIAGNOSIS — R1110 Vomiting, unspecified: Secondary | ICD-10-CM

## 2024-06-08 NOTE — Telephone Encounter (Signed)
 Pt requesting refills be sent to a different [facility], all requested medications are pending. CSC and UDS updated during last OV 06/04/24.  Please fill, if appropriate.

## 2024-06-09 MED ORDER — OMEPRAZOLE 40 MG PO CPDR: 40.0000 mg | DELAYED_RELEASE_CAPSULE | Freq: Two times a day (BID) | ORAL | 1 refills | Status: AC

## 2024-06-09 MED ORDER — ALPRAZOLAM 1 MG PO TABS: 2.0000 mg | ORAL_TABLET | Freq: Two times a day (BID) | ORAL | 5 refills | Status: DC

## 2024-06-09 MED ORDER — UBRELVY 50 MG PO TABS: ORAL_TABLET | ORAL | 1 refills | Status: AC

## 2024-06-09 MED ORDER — FAMOTIDINE 20 MG PO TABS: 20.0000 mg | ORAL_TABLET | Freq: Two times a day (BID) | ORAL | 1 refills | Status: AC

## 2024-06-09 MED ORDER — LISINOPRIL 20 MG PO TABS: 20.0000 mg | ORAL_TABLET | Freq: Every day | ORAL | 1 refills | Status: DC

## 2024-06-09 MED ORDER — FERROUS FUMARATE 324 (106 FE) MG PO TABS: 1.0000 | ORAL_TABLET | Freq: Every day | ORAL | 6 refills | Status: AC

## 2024-06-09 MED ORDER — ONDANSETRON 4 MG PO TBDP: 4.0000 mg | ORAL_TABLET | Freq: Three times a day (TID) | ORAL | 1 refills | Status: AC | PRN

## 2024-06-09 MED ORDER — CYANOCOBALAMIN 1000 MCG/ML IJ SOLN: 1.0000 mg | INTRAMUSCULAR | 0 refills | Status: AC

## 2024-06-09 NOTE — Telephone Encounter (Signed)
 MyChart message read by pt.

## 2024-06-15 ENCOUNTER — Encounter (HOSPITAL_COMMUNITY): Payer: Self-pay | Admitting: Psychiatry

## 2024-06-15 ENCOUNTER — Telehealth (HOSPITAL_COMMUNITY): Admitting: Psychiatry

## 2024-06-15 VITALS — Wt 245.0 lb

## 2024-06-15 DIAGNOSIS — F331 Major depressive disorder, recurrent, moderate: Secondary | ICD-10-CM | POA: Diagnosis not present

## 2024-06-15 DIAGNOSIS — F419 Anxiety disorder, unspecified: Secondary | ICD-10-CM | POA: Diagnosis not present

## 2024-06-15 DIAGNOSIS — F429 Obsessive-compulsive disorder, unspecified: Secondary | ICD-10-CM | POA: Diagnosis not present

## 2024-06-15 MED ORDER — DULOXETINE HCL 30 MG PO CPEP: 30.0000 mg | ORAL_CAPSULE | Freq: Every day | ORAL | 0 refills | Status: DC

## 2024-06-15 MED ORDER — LAMOTRIGINE 150 MG PO TABS: 150.0000 mg | ORAL_TABLET | Freq: Every day | ORAL | 0 refills | Status: DC

## 2024-06-15 MED ORDER — GABAPENTIN 300 MG PO CAPS: 300.0000 mg | ORAL_CAPSULE | Freq: Two times a day (BID) | ORAL | 0 refills | Status: DC

## 2024-06-15 NOTE — Progress Notes (Signed)
 [facility] MD Virtual Progress Note   Patient Location: In Car Provider Location: Home Office  I connect with patient by video and verified that I am speaking with correct person by using two identifiers. I discussed the limitations of evaluation and management by telemedicine and the availability of in person appointments. I also discussed with the patient that there may be a patient responsible charge related to this service. The patient expressed understanding and agreed to proceed.  Toni [name] [id number] 48 y.o.  06/15/2024 11:17 AM  History of Present Illness:  Patient is evaluated by video session.  She reported a lot of stress and anxiety because having issues with the job.  Patient told company may be filing bankruptcy.  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  She is actively looking for another job.  She like to go back on Cymbalta  as previously she recall it did help but could not continue because of insurance reason.  She also recall higher dose of Cymbalta  because side effects.  She is sleeping on and off.  She also reported back pain, fatigue, frequent awakening in sleep.  Patient reported fatigue and tiredness.  Sometimes she has forced to eat.  Patient has a history of gastric bypass.  She reported son is now [employer], hospital.  She reported in the beginning son has hard time but so far manageable.  Patient reported her living situation is the same but now she had given another room to rent and that helps her finances.  She reported intrusive and obsessive thoughts but denies hallucination, paranoia, suicidal thoughts.  Recently had a blood work.  Her cholesterol 269, LDL 149.  Her blood sugar is low.  She has no tremor or shakes or any EPS.  She denies any paranoia or any hallucination.  She denies any major panic attack but feel overwhelmed because of her job  situation.  Past Psychiatric History: H/O depression after got married.  Tried Zoloft for years but stopped after seizure. PCP tried trazodone , Ambien, Doxepin  and Seroquel  but  did not work.  We tried higher dose of Cymbalta  but developed side effects.  H/O irritability, poor impulse control with excessive buying and shopping but no history of mania, psychosis, suicidal attempt or inpatient treatment.      Past Medical History:  Diagnosis Date   Acute renal failure (HCC) 04/26/2021   prerenal azotemia   Allergic rhinitis    Altered sensorium due to hypoglycemia 03/2016   Anxiety and depression    started with PPD   Chronic neck pain    Myofascial pain syndrome per [facility], oxycodone  per their office pain med contract.  (Normal cervical MRI 2010 per ortho notes).   Easy bruisability 2011   Hx of: saw Hematologist (Dr. Freddie) and w/u neg for von Willebrand desease.   GERD (gastroesophageal reflux disease)    hx gastric ulcer and upper GI bleed, ? 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 Pt to start FeSO4 325 qd 10/28/19   Irregular menses 03/2018   GYN started [facility]  0.25-0.35.  [facility] as of 10/2018.  u/s wnl.  Endom bx NEG. OCPs helped.   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 Kassie   Seizure [facility]) 2015   s/p eval with neuro, no medication, no events since   Syncope    with ? seizure in 2015, eval with neruo   Vitamin B12 deficiency    Vitamin D  deficiency     Outpatient Encounter Medications as of 06/15/2024  Medication Sig   ALPRAZolam  (XANAX ) 1 MG tablet Take 2 tablets  (2 mg total) by mouth 2 (two) times daily.   Calcium  250 MG CAPS Take 1 capsule by mouth 3 (three) times daily.   cyanocobalamin  (VITAMIN B12) 1000 MCG/ML injection Inject 1 mL (1,000 mcg total) into the muscle every 2 weeks   ESTARYLLA  0.25-35 MG-MCG tablet Take 1 tablet by mouth daily.   famotidine  (PEPCID ) 20 MG tablet Take 1 tablet (20 mg total) by mouth 2 (two) times daily.   Ferrous Fumarate  (HEMOCYTE - 106 MG FE) 324 (106 Fe) MG TABS tablet Take 1 tablet (106 mg of iron  total) by mouth daily.   gabapentin  (NEURONTIN ) 300 MG capsule Take 1 capsule (300 mg total) by mouth 2 (two) times daily.   glucose blood (FREESTYLE LITE) test strip USE TO CHECK BLOOD SUGAR 3 TIMES A DAY   lamoTRIgine  (LAMICTAL ) 150 MG tablet Take 1 tablet (150 mg total) by mouth daily.   levocetirizine (XYZAL ) 5 MG tablet Take 1 tablet (5 mg total) by mouth every evening.   lisinopril  (ZESTRIL ) 20 MG tablet Take 1 tablet (20 mg total) by mouth daily.   metroNIDAZOLE  (FLAGYL ) 500 MG tablet Take 1 tablet (500 mg total) by mouth 2 (two) times daily for 7 days.   naloxone (NARCAN) nasal spray 4 mg/0.1 mL Place 1 spray into the nose once.   omeprazole  (PRILOSEC) 40 MG capsule Take 1 capsule (40 mg total) by mouth 2 (two) times daily.   ondansetron  (ZOFRAN -ODT) 4 MG disintegrating tablet Take 1 tablet (4 mg total) by mouth every 8 (eight) hours as needed.   oxyCODONE -acetaminophen  (PERCOCET) 10-325 MG tablet Take 1 tablet by mouth every 6 (six) hours as needed for pain.   Ubrogepant  (UBRELVY ) 50 MG TABS 1-2 tabs po once a day as needed for migraine headache   Vitamin D , Ergocalciferol , (DRISDOL ) 1.25 MG (50000 UNIT) CAPS capsule Take 1 capsule (50,000 Units total) by mouth 2 (two) times a week.   No facility-administered encounter medications on file as of 06/15/2024.    Recent Results (from the past 2160 hours)  DRUG MONITORING, PANEL 8 WITH CONFIRMATION, URINE     Status: Abnormal   Collection Time: 06/04/24  3:14 PM   Result Value Ref Range   Alcohol  Metabolites NEGATIVE <500 ng/mL   Amphetamines NEGATIVE <500 ng/mL   Benzodiazepines POSITIVE (A) <100 ng/mL   Alphahydroxyalprazolam 176 (H) <25 ng/mL   Alphahydroxymidazolam NEGATIVE <50 ng/mL   Alphahydroxytriazolam NEGATIVE <50 ng/mL   Aminoclonazepam NEGATIVE <25 ng/mL   Hydroxyethylflurazepam NEGATIVE <50 ng/mL   Lorazepam NEGATIVE <50 ng/mL   Nordiazepam NEGATIVE <50 ng/mL   Oxazepam NEGATIVE <50 ng/mL   Temazepam NEGATIVE <50 ng/mL   Benzodiazepines Comments      Comment: See Benzodiazepines Notes, LDT Notes   Buprenorphine, Urine POSITIVE (A) <5 ng/mL   Buprenorphine 2 (H) <2 ng/mL   Norbuprenorphine 15 (H) <2 ng/mL  Naloxone NEGATIVE <2 ng/mL   Buprenorphine Comments      Comment: See Buprenorphine Notes, LDT Notes   Cocaine Metabolite NEGATIVE <150 ng/mL   6 Acetylmorphine NEGATIVE CONFIRMED <10 ng/mL   6 Acetylmorphine NEGATIVE <10 ng/mL   Heroin Metab Comments      Comment: See LDT Notes   Marijuana Metabolite NEGATIVE <20 ng/mL   MDMA NEGATIVE <500 ng/mL   Opiates NEGATIVE CONFIRMED <100 ng/mL   Codeine NEGATIVE <50 ng/mL   Hydrocodone  NEGATIVE <50 ng/mL   Hydromorphone NEGATIVE <50 ng/mL   Morphine NEGATIVE <50 ng/mL   Norhydrocodone NEGATIVE <50 ng/mL   Opiates Comments      Comment: See LDT Notes   Oxycodone  POSITIVE (A) <100 ng/mL   Noroxycodone >10,000 (H) <50 ng/mL   Oxycodone  >10,000 (H) <50 ng/mL   Oxymorphone 273 (H) <50 ng/mL   Oxycodone  Comments      Comment: See Oxycodone  Notes, LDT Notes   Creatinine 275.0 > or = 20.0 mg/dL   pH 5.8 4.5 - 9.0   Oxidant NEGATIVE <200 mcg/mL  CBC with Differential/Platelet     Status: Abnormal   Collection Time: 06/04/24  3:14 PM  Result Value Ref Range   WBC 8.1 3.8 - 10.8 Thousand/uL   RBC 4.55 3.80 - 5.10 Million/uL   Hemoglobin 14.3 11.7 - 15.5 g/dL   HCT 54.6 (H) 64.9 - 54.9 %   MCV 99.6 80.0 - 100.0 fL   MCH 31.4 27.0 - 33.0 pg   MCHC 31.6 (L) 32.0 - 36.0 g/dL     Comment: For adults, a slight decrease in the calculated MCHC value (in the range of 30 to 32 g/dL) is most likely not clinically significant; however, it should be interpreted with caution in correlation with other red cell parameters and the patient's clinical condition.    RDW 13.4 11.0 - 15.0 %   Platelets 360 140 - 400 Thousand/uL   MPV 10.8 7.5 - 12.5 fL   Neutro Abs 3,313 1,500 - 7,800 cells/uL   Absolute Lymphocytes 3,920 (H) 850 - 3,900 cells/uL   Absolute Monocytes 575 200 - 950 cells/uL   Eosinophils Absolute 235 15 - 500 cells/uL   Basophils Absolute 57 0 - 200 cells/uL   Neutrophils Relative % 40.9 %   Total Lymphocyte 48.4 %   Monocytes Relative 7.1 %   Eosinophils Relative 2.9 %   Basophils Relative 0.7 %  Hemoglobin A1c     Status: None   Collection Time: 06/04/24  3:14 PM  Result Value Ref Range   Hgb A1c MFr Bld 5.0 <5.7 %    Comment: For the purpose of screening for the presence of diabetes: . <5.7%       Consistent with the absence of diabetes 5.7-6.4%    Consistent with increased risk for diabetes             (prediabetes) > or =6.5%  Consistent with diabetes . This assay result is consistent with a decreased risk of diabetes. . Currently, no consensus exists regarding use of hemoglobin A1c for diagnosis of diabetes in children. . According to American Diabetes Association (ADA) guidelines, hemoglobin A1c <7.0% represents optimal control in non-pregnant diabetic patients. Different metrics may apply to specific patient populations.  Standards of Medical Care in Diabetes(ADA). .    Mean Plasma Glucose 97 mg/dL   eAG (mmol/L) 5.4 mmol/L  Comprehensive metabolic panel with GFR     Status: Abnormal   Collection Time: 06/04/24  3:14 PM  Result Value Ref Range   Glucose, Bld 55 (L) 65 - 99 mg/dL    Comment: .            Fasting reference interval .    BUN 19 7 - 25 mg/dL   Creat 9.11 9.49 - 9.00 mg/dL   eGFR 81 > OR = 60 fO/fpw/8.26f7    BUN/Creatinine Ratio SEE NOTE: 6 - 22 (calc)    Comment:    Not Reported: BUN and Creatinine are within    reference range. .    Sodium 137 135 - 146 mmol/L   Potassium 4.2 3.5 - 5.3 mmol/L   Chloride 104 98 - 110 mmol/L   CO2 25 20 - 32 mmol/L   Calcium  8.9 8.6 - 10.2 mg/dL   Total Protein 6.2 6.1 - 8.1 g/dL   Albumin 4.0 3.6 - 5.1 g/dL   Globulin 2.2 1.9 - 3.7 g/dL (calc)   AG Ratio 1.8 1.0 - 2.5 (calc)   Total Bilirubin 0.3 0.2 - 1.2 mg/dL   Alkaline phosphatase (APISO) 68 31 - 125 U/L   AST 11 10 - 35 U/L   ALT 9 6 - 29 U/L  Lipid panel     Status: Abnormal   Collection Time: 06/04/24  3:14 PM  Result Value Ref Range   Cholesterol 269 (H) <200 mg/dL   HDL 81 > OR = 50 mg/dL   Triglycerides 756 (H) <150 mg/dL    Comment: . If a non-fasting specimen was collected, consider repeat triglyceride testing on a fasting specimen if clinically indicated.  Veatrice et al. J. of Clin. Lipidol. 2015;9:129-169. SABRA    LDL Cholesterol (Calc) 149 (H) mg/dL (calc)    Comment: Reference range: <100 . Desirable range <100 mg/dL for primary prevention;   <70 mg/dL for patients with CHD or diabetic patients  with > or = 2 CHD risk factors. SABRA LDL-C is now calculated using the Martin-Hopkins  calculation, which is a validated novel method providing  better accuracy than the Friedewald equation in the  estimation of LDL-C.  Gladis APPLETHWAITE et al. SANDREA. 7986;689(80): 2061-2068  (http://education.[facility].com/faq/FAQ164)    Total CHOL/HDL Ratio 3.3 <5.0 (calc)   Non-HDL Cholesterol (Calc) 188 (H) <130 mg/dL (calc)    Comment: For patients with diabetes plus 1 major ASCVD risk  factor, treating to a non-HDL-C goal of <100 mg/dL  (LDL-C of <29 mg/dL) is considered a therapeutic  option.   TSH     Status: None   Collection Time: 06/04/24  3:14 PM  Result Value Ref Range   TSH 1.04 mIU/L    Comment:           Reference Range .           > or = 20 Years  0.40-4.50 .                 Pregnancy Ranges           First trimester    0.26-2.66           Second trimester   0.55-2.73           Third trimester    0.43-2.91   VITAMIN D  25 Hydroxy (Vit-D Deficiency, Fractures)     Status: None   Collection Time: 06/04/24  3:14 PM  Result Value Ref Range   Vit D, 25-Hydroxy 65 30 - 100 ng/mL    Comment: Vitamin D  Status         25-OH Vitamin D : .  Deficiency:                    <20 ng/mL Insufficiency:             20 - 29 ng/mL Optimal:                 > or = 30 ng/mL . For 25-OH Vitamin D  testing on patients on  D2-supplementation and patients for whom quantitation  of D2 and D3 fractions is required, the [facility]ure[facility](TM) 25-OH VIT D, (D2,D3), LC/MS/MS is recommended: order  code 07111 (patients >67yrs). . See Note 1 . Note 1 . For additional information, please refer to  http://education.[facility].com/faq/FAQ199  (This link is being provided for informational/ educational purposes only.)   Vitamin B12     Status: None   Collection Time: 06/04/24  3:14 PM  Result Value Ref Range   Vitamin B-12 584 200 - 1,100 pg/mL  Iron , TIBC and Ferritin Panel     Status: None   Collection Time: 06/04/24  3:14 PM  Result Value Ref Range   Iron  71 40 - 190 mcg/dL   TIBC 677 749 - 549 mcg/dL (calc)   %SAT 22 16 - 45 % (calc)   Ferritin 49 16 - 232 ng/mL  DM TEMPLATE     Status: None   Collection Time: 06/04/24  3:14 PM  Result Value Ref Range   Notes and Comments      Comment: This drug testing is for medical treatment only. Analysis was performed as non-forensic testing and these results should be used only by healthcare providers to render diagnosis or treatment, or to monitor progress of medical conditions. . Benzodiazepines Notes: aOH Alprazolam  detected is consistent with the use of  the drug Alprazolam . . Buprenorphine Notes: Buprenorphine, Norbuprenorphine detected is consistent  with the use of the drug(s) Buprenorphine or  Buprenorphine with  Naloxone. . Naloxone may be negative due to poor oral  bioavailability and/or short half-life. . Oxycodone  Notes: Oxycodone , Noroxycodone, Oxymorphone detected is  consistent with the use of the drug Oxycodone . . Oxymorphone detected is consistent with the use of the  drug Oxymorphone. . Oxymorphone can be a prescribed drug and is also a  metabolite of Oxycodone . . LDT Notes: Confirmation tests were developed and their analytical  performance characteristics have been determined by   [employer]. It has not been cleared or approved  by the FDA. This assay has been validated pursuant to  the CLIA regulations and is used for clinical purposes. . . Healthcare Providers needing Interpretation assistance,  please contact us  at [phone] ([phone])  M-F, 8am to 10pm EST      Psychiatric Specialty Exam: Physical Exam  Review of Systems  Musculoskeletal:  Positive for back pain.  Psychiatric/Behavioral:  Positive for dysphoric mood. The patient is nervous/anxious.     Weight 245 lb (111.1 kg), last menstrual period 04/26/2024.There is no height or weight on file to calculate BMI.  General Appearance: Casual  Eye Contact:  Good  Speech:  Clear and Coherent  Volume:  Normal  Mood:  Anxious and Dysphoric  Affect:  Congruent  Thought Process:  Goal Directed  Orientation:  Full (Time, Place, and Person)  Thought Content:  Rumination  Suicidal Thoughts:  No  Homicidal Thoughts:  No  Memory:  Immediate;   Good Recent;   Good Remote;   Good  Judgement:  Intact  Insight:  Present  Psychomotor Activity:  Normal  Concentration:  Concentration: Good and  Attention Span: Good  Recall:  Good  Fund of Knowledge:  Good  Language:  Good  Akathisia:  No  Handed:  Right  AIMS (if indicated):     Assets:  Communication Skills Desire for Improvement Housing Talents/Skills Transportation  ADL's:  Intact  Cognition:  WNL  Sleep:  fair       06/04/2024    3:22  PM 12/22/2023    3:24 PM 11/28/2023    2:12 PM 05/16/2023    1:42 PM 11/24/2020    9:07 AM  Depression screen PHQ 2/9  Decreased Interest 0 0 1 1 2   Down, Depressed, Hopeless 0 0 1 1 1   PHQ - 2 Score 0 0 2 2 3   Altered sleeping 1  1 0 2  Tired, decreased energy 1  1 1 2   Change in appetite 0  0 2 1  Feeling bad or failure about yourself  0  0 0 2  Trouble concentrating 0  0 0 1  Moving slowly or fidgety/restless 0  0 0 0  Suicidal thoughts 0  0 0 0  PHQ-9 Score 2  4 5 11   Difficult doing work/chores Not difficult at all  Somewhat difficult Somewhat difficult Not difficult at all    Assessment/Plan: MDD (major depressive disorder), recurrent episode, moderate (HCC) - Plan: lamoTRIgine  (LAMICTAL ) 150 MG tablet, gabapentin  (NEURONTIN ) 300 MG capsule, DULoxetine  (CYMBALTA ) 30 MG capsule  Anxiety - Plan: gabapentin  (NEURONTIN ) 300 MG capsule, DULoxetine  (CYMBALTA ) 30 MG capsule  Obsessive-compulsive disorder, unspecified type - Plan: gabapentin  (NEURONTIN ) 300 MG capsule, DULoxetine  (CYMBALTA ) 30 MG capsule  Patient is 48 year old employed female with history of migraine, chronic back pain, hyperlipidemia, history of gastric bypass, malabsorption of iron  and B12, major depressive disorder, anxiety and obsessive-compulsive disorder.  Reviewed collateral notes, current medication and blood work results.  Cholesterol 269 LDL 149, hemoglobin A1c 5.0 and blood glucose 55.  Discussed current medication and symptoms of anxiety and nervousness.  She like to go back on Cymbalta .  In the past she used to take 40 mg but higher dose caused side effects.  She like Cymbalta  because it helps but could not continue due to insurance reason.  Recommend to start Cymbalta  30 mg daily.  For now continue gabapentin  300 mg twice a day.  Continue Lamictal  150 mg daily.  Patient also on narcotic pain medication for her back pain.  Discussed psychosocial stressors, patient is looking for another job as not sure about the  current job will last long due to bankruptcy issues.  Follow-up in 3 months unless patient requires sooner appointment.  Recommend to call back if symptoms worsening.   Follow Up Instructions:     I discussed the assessment and treatment plan with the patient. The patient was provided an opportunity to ask questions and all were answered. The patient agreed with the plan and demonstrated an understanding of the instructions.   The patient was advised to call back or seek an in-person evaluation if the symptoms worsen or if the condition fails to improve as anticipated.    Collaboration of Care: Other provider involved in patient's care AEB notes are available in epic to review  Patient/Guardian was advised Release of Information must be obtained prior to any record release in order to collaborate their care with an outside provider. Patient/Guardian was advised if they have not already done so to contact the registration department to sign all necessary forms in order for us  to release information regarding their care.  Consent: Patient/Guardian gives verbal consent for treatment and assignment of benefits for services provided during this visit. Patient/Guardian expressed understanding and agreed to proceed.     Total encounter time 27 minutes which includes face-to-face time, chart reviewed, care coordination, order entry and documentation during this encounter.   Note: This document was prepared by [employer] voice dictation technology and any errors that results from this process are unintentional.    Leni ONEIDA Client, MD 06/15/2024

## 2024-06-24 ENCOUNTER — Encounter: Payer: Self-pay | Admitting: Family Medicine

## 2024-06-29 ENCOUNTER — Ambulatory Visit (INDEPENDENT_AMBULATORY_CARE_PROVIDER_SITE_OTHER): Payer: PRIVATE HEALTH INSURANCE

## 2024-06-29 VITALS — Ht 65.5 in | Wt 245.0 lb

## 2024-06-29 DIAGNOSIS — L600 Ingrowing nail: Secondary | ICD-10-CM

## 2024-06-29 NOTE — Progress Notes (Signed)
 Subjective:  Patient ID: Toni [name], female    DOB: [birth year],  MRN: [id number]  Chief Complaint  Patient presents with   Ingrown Toenail    Patient is here for ingrown nail left medial border had both bilateral borders removed 20 years ago this one happens to be coming back    48 y.o. female presents with the above complaint.  She states that she has recently noted the right medial hallux is beginning to ingrowth.  Approximately 20 years ago she had bilateral nail borders borders of bilateral great toes permanently excised by Dr. Charlie Failing.  These have done very well and she has just recently began to experience ingrowing of the right medial hallux border.  Other nails are doing well without any ingrowing.  She denies any signs or symptoms of infection to the nailbed.   Review of Systems: Negative except as noted in the HPI. Denies N/V/F/Ch.  Past Medical History:  Diagnosis Date   Acute renal failure 04/26/2021   prerenal azotemia   Allergic rhinitis    Altered sensorium due to hypoglycemia 03/2016   Anxiety and depression    started with PPD   Chronic neck pain    Myofascial pain syndrome per [facility], oxycodone  per their office pain med contract.  (Normal cervical MRI 2010 per ortho notes).   Easy bruisability 2011   Hx of: saw Hematologist (Dr. Freddie) and w/u neg for von Willebrand desease.   GERD (gastroesophageal reflux disease)    hx gastric ulcer and upper GI bleed, ? 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 Pt to start FeSO4 325 qd 10/28/19   Irregular menses 03/2018   GYN started [facility]  0.25-0.35.  [facility] as of 10/2018.  u/s wnl.  Endom bx NEG. OCPs helped.   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 Kassie   Seizure [facility]) 2015   s/p eval with neuro, no medication, no events since   Syncope    with ? seizure in 2015, eval with neruo   Vitamin B12 deficiency    Vitamin D  deficiency     Current Outpatient Medications:    ALPRAZolam  (XANAX ) 1 MG tablet, Take 2 tablets (2 mg total) by mouth 2 (two) times daily., Disp: 120 tablet, Rfl: 5   Calcium  250 MG CAPS, Take 1 capsule by mouth 3 (three) times daily., Disp: , Rfl:    cyanocobalamin  (VITAMIN B12) 1000 MCG/ML injection, Inject 1 mL (1,000 mcg total) into the muscle every 2 weeks, Disp: 10 mL, Rfl: 0   DULoxetine  (CYMBALTA ) 30 MG capsule, Take 1 capsule (30 mg total) by mouth daily., Disp: 90 capsule, Rfl: 0   ESTARYLLA  0.25-35 MG-MCG tablet, Take 1 tablet by mouth daily., Disp: , Rfl:    famotidine  (PEPCID ) 20 MG tablet, Take 1 tablet (20 mg total) by mouth 2 (two) times daily., Disp: 180 tablet, Rfl: 1   Ferrous Fumarate  (HEMOCYTE - 106 MG FE) 324 (106 Fe) MG TABS tablet, Take 1 tablet (106 mg of iron  total) by mouth daily., Disp:  30 tablet, Rfl: 6   gabapentin  (NEURONTIN ) 300 MG capsule, Take 1 capsule (300 mg total) by mouth 2 (two) times daily., Disp: 180 capsule, Rfl: 0   glucose blood (FREESTYLE LITE) test strip, USE TO CHECK BLOOD SUGAR 3 TIMES A DAY, Disp: 200 strip, Rfl: 12   lamoTRIgine  (LAMICTAL ) 150 MG tablet, Take 1 tablet (150 mg total) by mouth daily., Disp: 90 tablet, Rfl: 0   levocetirizine (XYZAL ) 5 MG tablet, Take 1 tablet (5 mg total) by mouth every evening., Disp: 30 tablet, Rfl: 2   lisinopril  (ZESTRIL ) 20 MG tablet, Take 1 tablet (20 mg total) by mouth daily., Disp: 90 tablet, Rfl: 1   metroNIDAZOLE  (FLAGYL ) 500 MG tablet, Take 1 tablet (500 mg total) by mouth 2 (two) times daily for 7 days., Disp: 14 tablet, Rfl: 0   naloxone (NARCAN) nasal spray 4 mg/0.1 mL, Place 1 spray  into the nose once., Disp: , Rfl:    omeprazole  (PRILOSEC) 40 MG capsule, Take 1 capsule (40 mg total) by mouth 2 (two) times daily., Disp: 180 capsule, Rfl: 1   ondansetron  (ZOFRAN -ODT) 4 MG disintegrating tablet, Take 1 tablet (4 mg total) by mouth every 8 (eight) hours as needed., Disp: 20 tablet, Rfl: 1   oxyCODONE -acetaminophen  (PERCOCET) 10-325 MG tablet, Take 1 tablet by mouth every 6 (six) hours as needed for pain., Disp: , Rfl:    Ubrogepant  (UBRELVY ) 50 MG TABS, 1-2 tabs po once a day as needed for migraine headache, Disp: 30 tablet, Rfl: 1   Vitamin D , Ergocalciferol , (DRISDOL ) 1.25 MG (50000 UNIT) CAPS capsule, Take 1 capsule (50,000 Units total) by mouth 2 (two) times a week., Disp: 24 capsule, Rfl: 1  Social History   Tobacco Use  Smoking Status Never  Smokeless Tobacco Never    Allergies  Allergen Reactions   Ibuprofen Nausea Only    Causes ulcers   Objective:  There were no vitals filed for this visit. Body mass index is 40.15 kg/m. Constitutional Well developed. Well nourished.  Vascular Dorsalis pedis pulses palpable bilaterally. Posterior tibial pulses palpable bilaterally. Capillary refill normal to all digits.  No cyanosis or clubbing noted. Pedal hair growth normal.  Neurologic Normal speech. Oriented to person, place, and time. Epicritic sensation to light touch grossly present bilaterally.  Dermatologic Painful ingrowing nail at medial nail borders of the hallux nail right. Moderately painful to palpation.  No other open wounds. No skin lesions.  Orthopedic: Normal joint ROM without pain or crepitus bilaterally. No visible deformities. No bony tenderness.   Radiographs: None Assessment:   1. Ingrown toenail of right foot    Plan:  Patient was evaluated and all questions answered.  Ingrown Nail, right hallux -Discussed treatment options ranging from surgical to conservative along with risks and benefits of each. Patient expresses understanding  but states that she is going to visit her aunt in the hospital that she does not know her way around.  She does not wish to have a partial permanent nail avulsion performed today.  She is interested in doing this in the future.  She will schedule for a partial, permanent right medial hallux nail avulsion. We discussed the expected post operative course.   RTC for avulsion at patient's convenience  Prentice Ovens, DPM AACFAS Fellowship Trained Podiatric Surgeon [facility]

## 2024-06-29 NOTE — Patient Instructions (Signed)

## 2024-07-05 ENCOUNTER — Telehealth: Payer: PRIVATE HEALTH INSURANCE | Admitting: Family Medicine

## 2024-07-05 DIAGNOSIS — K1379 Other lesions of oral mucosa: Secondary | ICD-10-CM | POA: Diagnosis not present

## 2024-07-05 MED ORDER — LIDOCAINE VISCOUS HCL 2 % MT SOLN: 15.0000 mL | OROMUCOSAL | 0 refills | Status: DC | PRN

## 2024-07-05 NOTE — Progress Notes (Signed)
 E-Visit for Mouth Ulcers  We are sorry that you are not feeling well.  Here is how we plan to help!  Based on what you have shared with me, it appears that you do have mouth ulcer(s).     The following medications should decrease the discomfort and help with healing. Lidocaine swish   Mouth ulcers are painful areas in the mouth and gums. These are also known as "canker sores".  They can occur anywhere inside the mouth. While mostly harmless, mouth ulcers can be extremely uncomfortable and may make it difficult to eat, drink, and brush your teeth.  You may have more than 1 ulcer and they can vary and change in size. Mouth ulcers are not contagious and should not be confused with cold sores.  Cold sores appear on the lip or around the outside of the mouth and often begin with a tingling, burning or itching sensation.   While the exact causes are unknown, some common causes and factors that may aggravate mouth ulcers include: Genetics - Sometimes mouth ulcers run in families High alcohol  intake Acidic foods such as citrus fruits like pineapple, grapefruit, orange fruits/juices, may aggravate mouth ulcers Other foods high in acidity or spice such as coffee, chocolate, chips, pretzels, eggs, nuts, cheese Quitting smoking Injury caused by biting the tongue or inside of the cheek Diet lacking in B-12, zinc, folic acid  or iron  Female hormone shifts with menstruation Excessive fatigue, emotional stress or anxiety Prevention: Talk to your doctor if you are taking meds that are known to cause mouth ulcers such as:   Anti-inflammatory drugs (for example Ibuprofen, Naproxen sodium), pain killers, Beta blockers, Oral nicotine replacement drugs, Some street drugs (heroin).   Avoid allowing any tablets to dissolve in your mouth that are meant to swallowed whole Avoid foods/drinks that trigger or worsen symptoms Keep your mouth clean with daily brushing and flossing  Home Care: The goal with treatment is  to ease the pain where ulcers occur and help them heal as quickly as possible.  There is no medical treatment to prevent mouth ulcers from coming back or recurring.  Avoid spicy and acidic foods Eat soft foods and avoid rough, crunchy foods Avoid chewing gum Do not use toothpaste that contains sodium lauryl sulphite Use a straw to drink which helps avoid liquids toughing the ulcers near the front of your mouth Use a very soft toothbrush If you have dentures or dental hardware that you feel is not fitting well or contributing to his, please see your dentist. Use saltwater mouthwash which helps healing. Dissolve a  teaspoon of salt in a glass of warm water. Swish around your mouth and spit it out. This can be used as needed if it is soothing.   GET HELP RIGHT AWAY IF: Persistent ulcers require checking IN PERSON (face to face). Any mouth lesion lasting longer than a month should be seen by your DENTIST as soon as possible for evaluation for possible oral cancer. If you have a non-painful ulcer in 1 or more areas of your mouth Ulcers that are spreading, are very large or particularly painful Ulcers last longer than one week without improving on treatment If you develop a fever, swollen glands and begin to feel unwell Ulcers that developed after starting a new medication MAKE SURE YOU: Understand these instructions. Will watch your condition. Will get help right away if you are not doing well or get worse.  Thank you for choosing an e-visit.  Your e-visit answers were  reviewed by a board certified advanced clinical practitioner to complete your personal care plan. Depending upon the condition, your plan could have included both over the counter or prescription medications.  Please review your pharmacy choice. Make sure the pharmacy is open so you can pick up prescription now. If there is a problem, you may contact your provider through [employer] and have the prescription routed to  another pharmacy.  Your safety is important to us . If you have drug allergies check your prescription carefully.   For the next 24 hours you can use MyChart to ask questions about today's visit, request a non-urgent call back, or ask for a work or school excuse. You will get an email in the next two days asking about your experience. I hope that your e-visit has been valuable and will speed your recovery.  I provided 5 minutes of non face-to-face time during this encounter for chart review, medication and order placement, as well as and documentation.

## 2024-07-06 ENCOUNTER — Ambulatory Visit

## 2024-07-19 NOTE — Telephone Encounter (Signed)
 No further action needed.

## 2024-07-21 ENCOUNTER — Telehealth: Payer: PRIVATE HEALTH INSURANCE | Admitting: Physician Assistant

## 2024-07-21 DIAGNOSIS — R197 Diarrhea, unspecified: Secondary | ICD-10-CM | POA: Diagnosis not present

## 2024-07-21 NOTE — Progress Notes (Signed)
 We are sorry that you are not feeling well.  Here is how we plan to help!  Based on what you have shared with me it looks like you have Acute Infectious Diarrhea.  Most cases of acute diarrhea are due to infections with virus and bacteria and are self-limited conditions lasting less than 14 days.  For your symptoms you may take Imodium 2 mg tablets that are over the counter at your local pharmacy. Take two tablet now and then one after each loose stool up to 6 a day.   Antibiotics are not needed for most people with diarrhea.  HOME CARE We recommend changing your diet to help with your symptoms for the next few days. Drink plenty of fluids that contain water salt and sugar. Sports drinks such as Gatorade may help.  You may try broths, soups, bananas, applesauce, soft breads, mashed potatoes or crackers.  You are considered infectious for as long as the diarrhea continues. Hand washing or use of alcohol  based hand sanitizers is recommend. It is best to stay out of work or school until your symptoms stop.   GET HELP RIGHT AWAY If you have dark yellow colored urine or do not pass urine frequently you should drink more fluids.   If your symptoms worsen  If you feel like you are going to pass out (faint) You have a new problem  MAKE SURE YOU  Understand these instructions. Will watch your condition. Will get help right away if you are not doing well or get worse.  Your e-visit answers were reviewed by a board certified advanced clinical practitioner to complete your personal care plan.  Depending on the condition, your plan could have included both over the counter or prescription medications.  If there is a problem please reply  once you have received a response from your provider.  Your safety is important to us .  If you have drug allergies check your prescription carefully.    You can use MyChart to ask questions about today's visit, request a non-urgent call back, or ask for a work  or school excuse for 24 hours related to this e-Visit. If it has been greater than 24 hours you will need to follow up with your provider, or enter a new e-Visit to address those concerns.   You will get an e-mail in the next two days asking about your experience.  I hope that your e-visit has been valuable and will speed your recovery. Thank you for using e-visits.   I have spent 5 minutes in review of e-visit questionnaire, review and updating patient chart, medical decision making and response to patient.   Delon CHRISTELLA Dickinson, PA-C

## 2024-07-22 ENCOUNTER — Ambulatory Visit: Payer: PRIVATE HEALTH INSURANCE | Admitting: Family Medicine

## 2024-07-22 ENCOUNTER — Encounter (HOSPITAL_COMMUNITY): Payer: Self-pay | Admitting: *Deleted

## 2024-07-22 ENCOUNTER — Telehealth (HOSPITAL_COMMUNITY): Payer: Self-pay | Admitting: *Deleted

## 2024-07-22 ENCOUNTER — Ambulatory Visit: Payer: Self-pay

## 2024-07-22 MED ORDER — FLUOXETINE HCL 10 MG PO CAPS: 10.0000 mg | ORAL_CAPSULE | Freq: Every day | ORAL | 0 refills | Status: DC

## 2024-07-22 NOTE — Telephone Encounter (Signed)
 FYI Only or Action Required?: Action required by provider: request for appointment.  Patient was last seen in primary care on 06/04/2024 by McGowen, Aleene DEL, MD.  Called Nurse Triage reporting medication question.  Symptoms began several days ago.  Interventions attempted: Nothing.  Symptoms are: unchanged.  Triage Disposition: Call PCP Now  Patient/caregiver understands and will follow disposition?: Yes    Copied from CRM [id number]. Topic: Clinical - Red Word Triage >> Jul 22, 2024  8:06 AM Robinson DEL wrote: [facility] that prompted transfer to Nurse Triage: Upset stomach, cramping, dizziness Reason for Disposition  [1] Caller has URGENT medicine question about med that primary care doctor (or NP/PA) or specialist prescribed AND [2] triager unable to answer question  Answer Assessment - Initial Assessment Questions 1. NAME of MEDICINE: What medicine(s) are you calling about?     Cymbalta  2. QUESTION: What is your question? (e.g., double dose of medicine, side effect)     Side effect 3. PRESCRIBER: Who prescribed the medicine? Reason: if prescribed by specialist, call should be referred to that group.     PCP 4. SYMPTOMS: Do you have any symptoms? If Yes, ask: What symptoms are you having?  How bad are the symptoms (e.g., mild, moderate, severe)     Sunday, stomach pain, dizziness 5. PREGNANCY:  Is there any chance that you are pregnant? When was your last menstrual period?     no  Protocols used: Medication Question Call-A-AH

## 2024-07-22 NOTE — Telephone Encounter (Signed)
 Call returned.  Patient was started on Cymbalta  on her request 30 mg on her last visit.  She started taking this Friday as she was waiting for prior authorization.  Since started she is having nausea, dizziness, diarrhea.  She thought that it will go away but she continues to have symptoms and missed work.  She recall history of side effects with the higher dose of Cymbalta  and at that time she was taking 40 mg.  She still want to try something to help her anxiety and OCD.  Recommend to discontinue Cymbalta  and once the symptoms resolve she can trial low-dose Prozac 10 mg a day.  Discussed possible side effects of the medication.  She will also need a letter of excuse from the work.  Her last day of work was Friday and she like to resume work this coming Monday.

## 2024-07-22 NOTE — Telephone Encounter (Signed)
 Pt called with c/o side effects since restarting Cymbalta  last week. Pt describes feeling light headed and dizzy. She has been out of work for several days r/t this. Pt requesting a visit and RTW note. Please review and advise.

## 2024-07-22 NOTE — Telephone Encounter (Signed)
 Noted Pt scheduled for same day appt

## 2024-07-26 ENCOUNTER — Encounter: Payer: Self-pay | Admitting: Family Medicine

## 2024-07-26 NOTE — Telephone Encounter (Signed)
 No further action needed.

## 2024-08-03 ENCOUNTER — Telehealth: Payer: Self-pay

## 2024-08-03 ENCOUNTER — Other Ambulatory Visit (HOSPITAL_COMMUNITY): Payer: Self-pay

## 2024-08-03 NOTE — Telephone Encounter (Signed)
 Pharmacy Patient Advocate Encounter   Received notification from Onbase that prior authorization for Ubrelvy  50 mg tablets is required/requested.   Insurance verification completed.   The patient is insured through [facility] MEDICAID.   Per test claim: PA required; PA started via CoverMyMeds. KEY [id number] . Please see clinical question(s) below that I am not finding the answer to in their chart and advise.     *I also don't see any recent chart notes within the last year regarding patient's migraines. Recent chart notes are required to submit prior auth.

## 2024-08-03 NOTE — Telephone Encounter (Signed)
 Please see pt response to question for PA

## 2024-08-04 NOTE — Telephone Encounter (Signed)
 The last OV note was from 05/16/23. Nothing more recent available at this time

## 2024-08-05 NOTE — Telephone Encounter (Signed)
 FYI reviewed

## 2024-08-16 ENCOUNTER — Telehealth (HOSPITAL_COMMUNITY): Payer: Self-pay | Admitting: *Deleted

## 2024-08-16 NOTE — Telephone Encounter (Signed)
 Pt called to request increasing Cymbalta  dose as it is working well, but does feel higher dose would be more advantageous. Pt was on Cymbalta  40 mg in the past but stopped r/t side effects. Please review and advise.    Last visit: 06/15/24 Next visit: 09/14/24

## 2024-08-16 NOTE — Telephone Encounter (Signed)
 I would not recommend higher dose since she had side effects with Cymbalta  40 mg.  She need to stay on 30 mg.

## 2024-08-17 ENCOUNTER — Telehealth (HOSPITAL_COMMUNITY): Payer: Self-pay | Admitting: *Deleted

## 2024-08-17 MED ORDER — FLUOXETINE HCL 20 MG PO CAPS: 20.0000 mg | ORAL_CAPSULE | Freq: Every day | ORAL | 0 refills | Status: DC

## 2024-08-17 NOTE — Telephone Encounter (Signed)
 Pt says she would like to increase the Prozac. She is currently on 10 mg every day. She stopped taking the Cymbalta . Pt has a MM appointment on 09/14/24.

## 2024-08-17 NOTE — Telephone Encounter (Signed)
 I sent a new prescription of Prozac 20 mg daily.  Should not be taking Cymbalta  anymore.  If not doing well then may need an earlier appointment rather changing the medication on phone.  Thank you

## 2024-08-18 ENCOUNTER — Telehealth (HOSPITAL_COMMUNITY): Payer: Self-pay

## 2024-08-18 NOTE — Telephone Encounter (Signed)
 Patient called and said that she has stopped the Cymbalta  and is now on the Prozac, she does think it is working. 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 Please review and advise, thank you

## 2024-08-18 NOTE — Telephone Encounter (Signed)
 Please do one time but in future she need to contact PCP.

## 2024-08-19 ENCOUNTER — Encounter (HOSPITAL_COMMUNITY): Payer: Self-pay

## 2024-08-19 NOTE — Telephone Encounter (Signed)
 Called patient and let her know what Dr. Curry said. Letter was sent through my chart and emailed

## 2024-08-26 ENCOUNTER — Encounter: Payer: Self-pay | Admitting: Family Medicine

## 2024-08-26 ENCOUNTER — Other Ambulatory Visit: Payer: Self-pay

## 2024-08-26 MED ORDER — LEVOCETIRIZINE DIHYDROCHLORIDE 5 MG PO TABS: 5.0000 mg | ORAL_TABLET | Freq: Every evening | ORAL | 2 refills | Status: AC

## 2024-08-26 NOTE — Telephone Encounter (Signed)
 Rx sent to pharmacy for Levocetirizine.

## 2024-08-30 ENCOUNTER — Encounter (HOSPITAL_COMMUNITY): Payer: Self-pay | Admitting: Psychiatry

## 2024-08-30 ENCOUNTER — Telehealth (HOSPITAL_COMMUNITY): Payer: PRIVATE HEALTH INSURANCE | Admitting: Psychiatry

## 2024-08-30 VITALS — Wt 245.0 lb

## 2024-08-30 DIAGNOSIS — F429 Obsessive-compulsive disorder, unspecified: Secondary | ICD-10-CM

## 2024-08-30 DIAGNOSIS — F419 Anxiety disorder, unspecified: Secondary | ICD-10-CM

## 2024-08-30 DIAGNOSIS — F331 Major depressive disorder, recurrent, moderate: Secondary | ICD-10-CM

## 2024-08-30 MED ORDER — GABAPENTIN 400 MG PO CAPS
400.0000 mg | ORAL_CAPSULE | Freq: Two times a day (BID) | ORAL | 2 refills | Status: AC
Start: 2024-08-30 — End: ?

## 2024-08-30 MED ORDER — LAMOTRIGINE 150 MG PO TABS: 150.0000 mg | ORAL_TABLET | Freq: Every day | ORAL | 0 refills | Status: AC

## 2024-08-30 MED ORDER — FLUOXETINE HCL 40 MG PO CAPS: 40.0000 mg | ORAL_CAPSULE | Freq: Every day | ORAL | 0 refills | Status: AC

## 2024-08-30 NOTE — Progress Notes (Signed)
 [facility] MD Virtual Progress Note   Patient Location: In Car Provider Location: Home Office  I connect with patient by video and verified that I am speaking with correct person by using two identifiers. I discussed the limitations of evaluation and management by telemedicine and the availability of in person appointments. I also discussed with the patient that there may be a patient responsible charge related to this service. The patient expressed understanding and agreed to proceed.  Toni [name] [id number] 48 y.o.  08/30/2024 10:55 AM  History of Present Illness:  Patient is evaluated by video session.  She reported Prozac  helping her stress level and she is not as anxious or nervous but is still struggling with insomnia and racing thoughts.  She noticed Prozac  helped some of her obsessive thoughts.  She used to obsess about ice coffee and that has better now.  She is very happy about her son who is now doing very well and had a girlfriend from [place].  She really liked her a lot.  Her plan is to see her and her aunt on holidays.  She is taking gabapentin , Lamictal  and Prozac .  She is taking 20 mg.  She had called our office after having issues and side effects from Cymbalta .  So far tolerating very well and reported no side effects.  She denies any hallucination, paranoia, suicidal thoughts.  She still have a lot of obsessive thoughts and anxiety.  She is concerned about her job because company may be filed bankruptcy in January or laid off.  She is not sure but so far her job is stable until January.  She denies drinking or using any illegal substances.  She is taking Xanax  and pain medicine prescribed by other provider.  She denies any major panic attack.  Past Psychiatric History: H/O depression after got married.  Tried Zoloft for years but stopped after seizure. PCP tried trazodone , Ambien, Doxepin  and Seroquel  but  did not work.  We tried higher dose of Cymbalta  but  developed side effects.  H/O irritability, poor impulse control with excessive buying and shopping but no history of mania, psychosis, suicidal attempt or inpatient treatment.    Past Medical History:  Diagnosis Date   Acute renal failure 04/26/2021   prerenal azotemia   Allergic rhinitis    Altered sensorium due to hypoglycemia 03/2016   Anxiety and depression    started with PPD   Chronic neck pain    Myofascial pain syndrome per [facility], oxycodone  per their office pain med contract.  (Normal cervical MRI 2010 per ortho notes).   Easy bruisability 2011   Hx of: saw Hematologist (Dr. Freddie) and w/u neg for von Willebrand desease.   GERD (gastroesophageal reflux disease)    hx gastric ulcer and upper GI bleed, ? 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 Pt to start FeSO4 325 qd 10/28/19   Irregular menses 03/2018   GYN started [facility]  0.25-0.35.  [facility] as of 10/2018.  u/s wnl.  Endom bx NEG. OCPs helped.   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 Kassie   Seizure [facility]) 2015   s/p eval with neuro, no medication, no events since   Syncope    with ? seizure in 2015, eval with neruo   Vitamin B12 deficiency    Vitamin D  deficiency     Outpatient Encounter Medications as of 08/30/2024  Medication Sig   ALPRAZolam  (XANAX ) 1 MG tablet Take 2 tablets (2 mg total) by mouth 2 (two) times daily.   Calcium  250 MG CAPS Take 1 capsule by mouth 3 (three) times daily.   cyanocobalamin  (VITAMIN B12) 1000 MCG/ML injection Inject 1 mL (1,000 mcg total) into the muscle every 2 weeks   DULoxetine   (CYMBALTA ) 30 MG capsule Take 1 capsule (30 mg total) by mouth daily. (Patient not taking: Reported on 07/22/2024)   ESTARYLLA  0.25-35 MG-MCG tablet Take 1 tablet by mouth daily.   famotidine  (PEPCID ) 20 MG tablet Take 1 tablet (20 mg total) by mouth 2 (two) times daily.   Ferrous Fumarate  (HEMOCYTE - 106 MG FE) 324 (106 Fe) MG TABS tablet Take 1 tablet (106 mg of iron  total) by mouth daily.   FLUoxetine  (PROZAC ) 20 MG capsule Take 1 capsule (20 mg total) by mouth daily.   gabapentin  (NEURONTIN ) 300 MG capsule Take 1 capsule (300 mg total) by mouth 2 (two) times daily.   glucose blood (FREESTYLE LITE) test strip USE TO CHECK BLOOD SUGAR 3 TIMES A DAY   lamoTRIgine  (LAMICTAL ) 150 MG tablet Take 1 tablet (150 mg total) by mouth daily.   levocetirizine (XYZAL ) 5 MG tablet Take 1 tablet (5 mg total) by mouth every evening.   lidocaine  (XYLOCAINE ) 2 % solution Use as directed 15 mLs in the mouth or throat as needed for mouth pain.   lisinopril  (ZESTRIL ) 20 MG tablet Take 1 tablet (20 mg total) by mouth daily.   metroNIDAZOLE  (FLAGYL ) 500 MG tablet Take 1 tablet (500 mg total) by mouth 2 (two) times daily for 7 days.   naloxone (NARCAN) nasal spray 4 mg/0.1 mL Place 1 spray into the nose once.   omeprazole  (PRILOSEC) 40 MG capsule Take 1 capsule (40 mg total) by mouth 2 (two) times daily.   ondansetron  (ZOFRAN -ODT) 4 MG disintegrating tablet Take 1 tablet (4 mg total) by mouth every 8 (eight) hours as needed.   oxyCODONE -acetaminophen  (PERCOCET) 10-325 MG tablet Take 1 tablet by mouth every 6 (six) hours as needed for pain.   Ubrogepant  (UBRELVY ) 50 MG TABS 1-2 tabs po once a day as needed for migraine headache   Vitamin D , Ergocalciferol , (DRISDOL ) 1.25 MG (50000 UNIT) CAPS capsule Take 1 capsule (50,000 Units total) by mouth 2 (two) times a week.   No facility-administered encounter medications on file as of 08/30/2024.    Recent Results (from the past 2160 hours)  DRUG MONITORING, PANEL 8 WITH  CONFIRMATION, URINE     Status: Abnormal   Collection Time: 06/04/24  3:14 PM  Result Value Ref Range   Alcohol  Metabolites NEGATIVE <500 ng/mL   Amphetamines NEGATIVE <500 ng/mL   Benzodiazepines POSITIVE (A) <100 ng/mL   Alphahydroxyalprazolam 176 (H) <25 ng/mL   Alphahydroxymidazolam NEGATIVE <50 ng/mL   Alphahydroxytriazolam NEGATIVE <50 ng/mL   Aminoclonazepam NEGATIVE <25 ng/mL   Hydroxyethylflurazepam NEGATIVE <50 ng/mL   Lorazepam NEGATIVE <50 ng/mL   Nordiazepam NEGATIVE <50 ng/mL   Oxazepam NEGATIVE <50 ng/mL   Temazepam NEGATIVE <50 ng/mL   Benzodiazepines Comments      Comment: See Benzodiazepines Notes,  LDT Notes   Buprenorphine, Urine POSITIVE (A) <5 ng/mL   Buprenorphine 2 (H) <2 ng/mL   Norbuprenorphine 15 (H) <2 ng/mL    Naloxone NEGATIVE <2 ng/mL   Buprenorphine Comments      Comment: See Buprenorphine Notes, LDT Notes   Cocaine Metabolite NEGATIVE <150 ng/mL   6 Acetylmorphine NEGATIVE CONFIRMED <10 ng/mL   6 Acetylmorphine NEGATIVE <10 ng/mL   Heroin Metab Comments      Comment: See LDT Notes   Marijuana Metabolite NEGATIVE <20 ng/mL   MDMA NEGATIVE <500 ng/mL   Opiates NEGATIVE CONFIRMED <100 ng/mL   Codeine NEGATIVE <50 ng/mL   Hydrocodone  NEGATIVE <50 ng/mL   Hydromorphone NEGATIVE <50 ng/mL   Morphine NEGATIVE <50 ng/mL   Norhydrocodone NEGATIVE <50 ng/mL   Opiates Comments      Comment: See LDT Notes   Oxycodone  POSITIVE (A) <100 ng/mL   Noroxycodone >10,000 (H) <50 ng/mL   Oxycodone  >10,000 (H) <50 ng/mL   Oxymorphone 273 (H) <50 ng/mL   Oxycodone  Comments      Comment: See Oxycodone  Notes, LDT Notes   Creatinine 275.0 > or = 20.0 mg/dL   pH 5.8 4.5 - 9.0   Oxidant NEGATIVE <200 mcg/mL  CBC with Differential/Platelet     Status: Abnormal   Collection Time: 06/04/24  3:14 PM  Result Value Ref Range   WBC 8.1 3.8 - 10.8 Thousand/uL   RBC 4.55 3.80 - 5.10 Million/uL   Hemoglobin 14.3 11.7 - 15.5 g/dL   HCT 54.6 (H) 64.9 - 54.9 %   MCV 99.6  80.0 - 100.0 fL   MCH 31.4 27.0 - 33.0 pg   MCHC 31.6 (L) 32.0 - 36.0 g/dL    Comment: For adults, a slight decrease in the calculated MCHC value (in the range of 30 to 32 g/dL) is most likely not clinically significant; however, it should be interpreted with caution in correlation with other red cell parameters and the patient's clinical condition.    RDW 13.4 11.0 - 15.0 %   Platelets 360 140 - 400 Thousand/uL   MPV 10.8 7.5 - 12.5 fL   Neutro Abs 3,313 1,500 - 7,800 cells/uL   Absolute Lymphocytes 3,920 (H) 850 - 3,900 cells/uL   Absolute Monocytes 575 200 - 950 cells/uL   Eosinophils Absolute 235 15 - 500 cells/uL   Basophils Absolute 57 0 - 200 cells/uL   Neutrophils Relative % 40.9 %   Total Lymphocyte 48.4 %   Monocytes Relative 7.1 %   Eosinophils Relative 2.9 %   Basophils Relative 0.7 %  Hemoglobin A1c     Status: None   Collection Time: 06/04/24  3:14 PM  Result Value Ref Range   Hgb A1c MFr Bld 5.0 <5.7 %    Comment: For the purpose of screening for the presence of diabetes: . <5.7%       Consistent with the absence of diabetes 5.7-6.4%    Consistent with increased risk for diabetes             (prediabetes) > or =6.5%  Consistent with diabetes . This assay result is consistent with a decreased risk of diabetes. . Currently, no consensus exists regarding use of hemoglobin A1c for diagnosis of diabetes in children. . According to American Diabetes Association (ADA) guidelines, hemoglobin A1c <7.0% represents optimal control in non-pregnant diabetic patients. Different metrics may apply to specific patient populations.  Standards of Medical Care in Diabetes(ADA). .    Mean Plasma Glucose 97 mg/dL  eAG (mmol/L) 5.4 mmol/L  Comprehensive metabolic panel with GFR     Status: Abnormal   Collection Time: 06/04/24  3:14 PM  Result Value Ref Range   Glucose, Bld 55 (L) 65 - 99 mg/dL    Comment: .            Fasting reference interval .    BUN 19 7 - 25  mg/dL   Creat 9.11 9.49 - 9.00 mg/dL   eGFR 81 > OR = 60 fO/fpw/8.26f7   BUN/Creatinine Ratio SEE NOTE: 6 - 22 (calc)    Comment:    Not Reported: BUN and Creatinine are within    reference range. .    Sodium 137 135 - 146 mmol/L   Potassium 4.2 3.5 - 5.3 mmol/L   Chloride 104 98 - 110 mmol/L   CO2 25 20 - 32 mmol/L   Calcium  8.9 8.6 - 10.2 mg/dL   Total Protein 6.2 6.1 - 8.1 g/dL   Albumin 4.0 3.6 - 5.1 g/dL   Globulin 2.2 1.9 - 3.7 g/dL (calc)   AG Ratio 1.8 1.0 - 2.5 (calc)   Total Bilirubin 0.3 0.2 - 1.2 mg/dL   Alkaline phosphatase (APISO) 68 31 - 125 U/L   AST 11 10 - 35 U/L   ALT 9 6 - 29 U/L  Lipid panel     Status: Abnormal   Collection Time: 06/04/24  3:14 PM  Result Value Ref Range   Cholesterol 269 (H) <200 mg/dL   HDL 81 > OR = 50 mg/dL   Triglycerides 756 (H) <150 mg/dL    Comment: . If a non-fasting specimen was collected, consider repeat triglyceride testing on a fasting specimen if clinically indicated.  Veatrice et al. J. of Clin. Lipidol. 2015;9:129-169. SABRA    LDL Cholesterol (Calc) 149 (H) mg/dL (calc)    Comment: Reference range: <100 . Desirable range <100 mg/dL for primary prevention;   <70 mg/dL for patients with CHD or diabetic patients  with > or = 2 CHD risk factors. SABRA LDL-C is now calculated using the Martin-Hopkins  calculation, which is a validated novel method providing  better accuracy than the Friedewald equation in the  estimation of LDL-C.  Gladis APPLETHWAITE et al. SANDREA. 7986;689(80): 2061-2068  (http://education.[facility].com/faq/FAQ164)    Total CHOL/HDL Ratio 3.3 <5.0 (calc)   Non-HDL Cholesterol (Calc) 188 (H) <130 mg/dL (calc)    Comment: For patients with diabetes plus 1 major ASCVD risk  factor, treating to a non-HDL-C goal of <100 mg/dL  (LDL-C of <29 mg/dL) is considered a therapeutic  option.   TSH     Status: None   Collection Time: 06/04/24  3:14 PM  Result Value Ref Range   TSH 1.04 mIU/L    Comment:            Reference Range .           > or = 20 Years  0.40-4.50 .                Pregnancy Ranges           First trimester    0.26-2.66           Second trimester   0.55-2.73           Third trimester    0.43-2.91   VITAMIN D  25 Hydroxy (Vit-D Deficiency, Fractures)     Status: None   Collection Time: 06/04/24  3:14 PM  Result Value Ref Range   Vit  D, 25-Hydroxy 65 30 - 100 ng/mL    Comment: Vitamin D  Status         25-OH Vitamin D : . Deficiency:                    <20 ng/mL Insufficiency:             20 - 29 ng/mL Optimal:                 > or = 30 ng/mL . For 25-OH Vitamin D  testing on patients on  D2-supplementation and patients for whom quantitation  of D2 and D3 fractions is required, the [facility]ure[facility](TM) 25-OH VIT D, (D2,D3), LC/MS/MS is recommended: order  code 07111 (patients >109yrs). . See Note 1 . Note 1 . For additional information, please refer to  http://education.[facility].com/faq/FAQ199  (This link is being provided for informational/ educational purposes only.)   Vitamin B12     Status: None   Collection Time: 06/04/24  3:14 PM  Result Value Ref Range   Vitamin B-12 584 200 - 1,100 pg/mL  Iron , TIBC and Ferritin Panel     Status: None   Collection Time: 06/04/24  3:14 PM  Result Value Ref Range   Iron  71 40 - 190 mcg/dL   TIBC 677 749 - 549 mcg/dL (calc)   %SAT 22 16 - 45 % (calc)   Ferritin 49 16 - 232 ng/mL  DM TEMPLATE     Status: None   Collection Time: 06/04/24  3:14 PM  Result Value Ref Range   Notes and Comments      Comment: This drug testing is for medical treatment only. Analysis was performed as non-forensic testing and these results should be used only by healthcare providers to render diagnosis or treatment, or to monitor progress of medical conditions. . Benzodiazepines Notes: aOH Alprazolam  detected is consistent with the use of  the drug Alprazolam . . Buprenorphine Notes: Buprenorphine, Norbuprenorphine detected is consistent   with the use of the drug(s) Buprenorphine or  Buprenorphine with Naloxone. . Naloxone may be negative due to poor oral  bioavailability and/or short half-life. . Oxycodone  Notes: Oxycodone , Noroxycodone, Oxymorphone detected is  consistent with the use of the drug Oxycodone . . Oxymorphone detected is consistent with the use of the  drug Oxymorphone. . Oxymorphone can be a prescribed drug and is also a  metabolite of Oxycodone . . LDT Notes: Confirmation tests were developed and their analytical  performance characteristics have been determined by   [employer]. It has not been cleared or approved  by the FDA. This assay has been validated pursuant to  the CLIA regulations and is used for clinical purposes. . . Healthcare Providers needing Interpretation assistance,  please contact us  at [phone] ([phone])  M-F, 8am to 10pm EST      Psychiatric Specialty Exam: Physical Exam  Review of Systems  Weight 245 lb (111.1 kg).There is no height or weight on file to calculate BMI.  General Appearance: Casual  Eye Contact:  Fair  Speech:  Normal Rate  Volume:  Normal  Mood:  Dysphoric  Affect:  Appropriate  Thought Process:  Goal Directed  Orientation:  Full (Time, Place, and Person)  Thought Content:  Obsessions  Suicidal Thoughts:  No  Homicidal Thoughts:  No  Memory:  Immediate;   Good Recent;   Good Remote;   Good  Judgement:  Intact  Insight:  Present  Psychomotor Activity:  Normal  Concentration:  Concentration: Good and Attention Span:  Good  Recall:  Good  Fund of Knowledge:  Good  Language:  Good  Akathisia:  No  Handed:  Right  AIMS (if indicated):     Assets:  Communication Skills Desire for Improvement Housing Resilience Talents/Skills Transportation  ADL's:  Intact  Cognition:  WNL  Sleep:  fair       06/04/2024    3:22 PM 12/22/2023    3:24 PM 11/28/2023    2:12 PM 05/16/2023    1:42 PM 11/24/2020    9:07 AM  Depression  screen PHQ 2/9  Decreased Interest 0 0 1 1 2   Down, Depressed, Hopeless 0 0 1 1 1   PHQ - 2 Score 0 0 2 2 3   Altered sleeping 1  1 0 2  Tired, decreased energy 1  1 1 2   Change in appetite 0  0 2 1  Feeling bad or failure about yourself  0  0 0 2  Trouble concentrating 0  0 0 1  Moving slowly or fidgety/restless 0  0 0 0  Suicidal thoughts 0  0 0 0  PHQ-9 Score 2   4  5  11    Difficult doing work/chores Not difficult at all  Somewhat difficult Somewhat difficult Not difficult at all     Data saved with a previous flowsheet row definition    Assessment/Plan: Obsessive-compulsive disorder, unspecified type - Plan: FLUoxetine  (PROZAC ) 40 MG capsule, gabapentin  (NEURONTIN ) 400 MG capsule  MDD (major depressive disorder), recurrent episode, moderate (HCC) - Plan: FLUoxetine  (PROZAC ) 40 MG capsule, lamoTRIgine  (LAMICTAL ) 150 MG tablet, gabapentin  (NEURONTIN ) 400 MG capsule  Anxiety - Plan: FLUoxetine  (PROZAC ) 40 MG capsule, gabapentin  (NEURONTIN ) 400 MG capsule  Patient is a 48 year old employed female with history of migraine, chronic back pain, hyperlipidemia, malabsorption of iron , B12, major depressive disorder, anxiety and obsessive-compulsive disorder.  Review telephone encounter and current medication.  She is no longer taking Cymbalta  after having side effects.  She is now on Prozac  20 mg.  She took few days off after having side effects and issues with the medication.  Discussed psychosocial stress related to her job.  She is not sure about the future of the job in January as company may file for bankruptcy.  Encouraged to continue therapy with Joane.  She still struggling with insomnia.  Recommend to try gabapentin  higher dose from 300 mg twice a day to 400 mg twice a day.  I will also increase her Prozac  from 20 mg to 40 mg to help her obsessive thoughts.  Continue Lamictal  150 mg daily.  So far no rash or any itching.  Recommend to call back if she has any question or any concern.  Will  follow-up in 3 months unless patient require in a sooner appointment.   Follow Up Instructions:     I discussed the assessment and treatment plan with the patient. The patient was provided an opportunity to ask questions and all were answered. The patient agreed with the plan and demonstrated an understanding of the instructions.   The patient was advised to call back or seek an in-person evaluation if the symptoms worsen or if the condition fails to improve as anticipated.    Collaboration of Care: Other provider involved in patient's care AEB notes are available in epic to review  Patient/Guardian was advised Release of Information must be obtained prior to any record release in order to collaborate their care with an outside provider. Patient/Guardian was advised if they have not already done  so to contact the registration department to sign all necessary forms in order for us  to release information regarding their care.   Consent: Patient/Guardian gives verbal consent for treatment and assignment of benefits for services provided during this visit. Patient/Guardian expressed understanding and agreed to proceed.     Total encounter time 22 minutes which includes face-to-face time, chart reviewed, care coordination, order entry and documentation during this encounter.   Note: This document was prepared by [employer] voice dictation technology and any errors that results from this process are unintentional.    Leni ONEIDA Client, MD 08/30/2024

## 2024-09-10 ENCOUNTER — Encounter: Payer: Self-pay | Admitting: Sports Medicine

## 2024-09-10 ENCOUNTER — Ambulatory Visit: Payer: PRIVATE HEALTH INSURANCE | Admitting: Sports Medicine

## 2024-09-10 VITALS — BP 128/84 | HR 71 | Temp 98.3°F | Wt 240.1 lb

## 2024-09-10 DIAGNOSIS — R0981 Nasal congestion: Secondary | ICD-10-CM

## 2024-09-10 DIAGNOSIS — R5383 Other fatigue: Secondary | ICD-10-CM

## 2024-09-10 DIAGNOSIS — J0100 Acute maxillary sinusitis, unspecified: Secondary | ICD-10-CM

## 2024-09-10 DIAGNOSIS — R051 Acute cough: Secondary | ICD-10-CM

## 2024-09-10 LAB — POCT INFLUENZA A/B
Influenza A, POC: NEGATIVE
Influenza B, POC: NEGATIVE

## 2024-09-10 LAB — POC COVID19 BINAXNOW: SARS Coronavirus 2 Ag: NEGATIVE

## 2024-09-10 MED ORDER — BENZONATATE 200 MG PO CAPS: 200.0000 mg | ORAL_CAPSULE | Freq: Two times a day (BID) | ORAL | 0 refills | Status: DC | PRN

## 2024-09-10 MED ORDER — AMOXICILLIN-POT CLAVULANATE 500-125 MG PO TABS: 1.0000 | ORAL_TABLET | Freq: Three times a day (TID) | ORAL | 0 refills | Status: DC

## 2024-09-10 NOTE — Progress Notes (Signed)
 Careteam: Patient Care Team: Candise Aleene DEL, MD as PCP - General (Family Medicine) Bonner Ade, MD as Consulting Physician (Physical Medicine and Rehabilitation) Freddie Lynwood HERO, MD as Consulting Physician (Oncology) Perri Bjork, PA-C as Physician Assistant (Obstetrics and Gynecology) Kassie Mallick, MD (Inactive) as Consulting Physician (Endocrinology) Verla Ivanoff, PA-C as Physician Assistant (Chiropractic Medicine) Curry Leni DASEN, MD as Consulting Physician (Psychiatry) Carolee Lynwood JINNY DOUGLAS, MD as Consulting Physician Armbruster, Elspeth SQUIBB, MD as Consulting Physician (Gastroenterology)  Allergies  Allergen Reactions   Ibuprofen Nausea Only    Causes ulcers    Chief Complaint  Patient presents with   Cough    Pt started feeling sick on Tuesday. Symptoms are congestion, fatigue. She denies fever. She has been out of work since Tuesday and will need a work note.    Discussed the use of AI scribe software for clinical note transcription with the patient, who gave verbal consent to proceed.  History of Present Illness  Toni [name] is a 48 year old female who presents with a persistent nighttime cough and congestion.  She has been experiencing a persistent nighttime cough since Monday, which is particularly bothersome and accompanied by phlegm. The cough is absent during the day. She has tried over-the-counter medications including Mucinex, Tylenol , and Nyquil without significant relief. No fever, chills, or significant nasal drainage beyond her usual baseline.   She experiences pressure in her ears without pain or drainage. No sore throat. She reports a sensation of post-nasal drip when lying down and sinus discomfort.  No swelling in her legs, breathing difficulties, or urinary symptoms. She experiences body aches and headaches located in the front of her head, which have been persistent since the onset of her symptoms. She also reports feeling weak and  dizzy.  She has a decreased appetite, primarily consuming chicken noodle soup. No diarrhea or loose stools, attributing any nausea to her iron  pill. She has a history of intolerance to ibuprofen, which she describes as more of an intolerance than an allergy.    Review of Systems:  Review of Systems  Constitutional:  Positive for malaise/fatigue. Negative for chills and fever.  HENT:  Positive for congestion and sinus pain. Negative for ear discharge, ear pain and sore throat.   Eyes:  Negative for blurred vision.  Respiratory:  Positive for cough and sputum production. Negative for shortness of breath.   Cardiovascular:  Negative for chest pain and leg swelling.  Gastrointestinal:  Negative for abdominal pain, nausea and vomiting.  Genitourinary:  Negative for dysuria and urgency.  Musculoskeletal:  Positive for myalgias.  Neurological:  Positive for headaches. Negative for dizziness.   Negative unless indicated in HPI.   Patient Active Problem List   Diagnosis Date Noted   Anemia, iron  deficiency 12/09/2023   Nausea, vomiting and diarrhea 06/12/2023   Lumbar radiculopathy 05/07/2021   Hyponatremia 04/27/2021   AKI (acute kidney injury) 04/27/2021   Hypotension 04/26/2021   Pain of right thumb 12/07/2019   Pain, elbow joint 12/07/2019   MDD (major depressive disorder), recurrent episode, moderate (HCC) 08/19/2019   Allergic rhinitis 04/20/2019   Vaginal irritation 05/06/2018   Menorrhagia 04/03/2018   Long term (current) use of opiate analgesic 10/31/2017   Insomnia secondary to anxiety 05/02/2016   Altered mental status 04/11/2016   Hypoglycemia 03/21/2016   Encephalopathy acute 03/21/2016   Vaginal discharge 02/13/2016   Visit for preventive health examination 11/24/2015   Vitamin B12 deficiency 06/05/2015   Vitamin D  deficiency 04/30/2015  Chronic neck pain 04/11/2015   Esophageal reflux 04/11/2015   Class 3 obesity with BMI of 43.80 04/11/2015   Anxiety and  depression 04/11/2015   Migraine    History of bariatric surgery 12/19/2001   Past Medical History:  Diagnosis Date   Acute renal failure 04/26/2021   prerenal azotemia   Allergic rhinitis    Altered sensorium due to hypoglycemia 03/2016   Anxiety and depression    started with PPD   Chronic neck pain    Myofascial pain syndrome per [facility], oxycodone  per their office pain med contract.  (Normal cervical MRI 2010 per ortho notes).   Easy bruisability 2011   Hx of: saw Hematologist (Dr. Freddie) and w/u neg for von Willebrand desease.   GERD (gastroesophageal reflux disease)    hx gastric ulcer and upper GI bleed, ? 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 Pt to start FeSO4 325 qd 10/28/19   Irregular menses 03/2018   GYN started [facility]  0.25-0.35.  [facility] as of 10/2018.  u/s wnl.  Endom bx NEG. OCPs helped.   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 Kassie   Seizure [facility]) 2015   s/p eval with neuro, no medication, no events since   Syncope    with ? seizure in 2015, eval with neruo   Vitamin B12 deficiency    Vitamin D  deficiency    Past Surgical History:  Procedure Laterality Date   CESAREAN SECTION  2006   COLONOSCOPY W/ POLYPECTOMY     adenomas 11/17/23-->recall 7 yrs   ELBOW X-RAY Left 12/07/2019   ENDOMETRIAL BIOPSY     EYE SURGERY Bilateral    2001   FINGER X-RAY Right 12/07/2019   ROUX-EN-Y GASTRIC BYPASS  01/05/2002   TONSILLECTOMY AND ADENOIDECTOMY     TUBAL LIGATION  2006   tubes in ears both Bilateral    Social History   Tobacco Use   Smoking status:  Never   Smokeless tobacco: Never  Vaping Use   Vaping status: Never Used  Substance Use Topics   Alcohol  use: Yes    Alcohol /week: 0.0 standard drinks of alcohol     Comment: occasional    Drug use: No   Family History  Problem Relation Age of Onset   Arthritis Mother    Hyperlipidemia Mother    Heart disease Mother    Diabetes Mother    Asthma Mother    Aneurysm Father        deceased   Lung cancer Maternal Aunt        smoker   Lung cancer Maternal Aunt        smoker   Colon cancer Maternal Grandmother    Esophageal cancer Neg Hx    Rectal cancer Neg Hx    Stomach cancer Neg Hx    Allergies  Allergen Reactions   Ibuprofen Nausea Only    Causes ulcers    Medications: Patient's Medications  New Prescriptions   AMOXICILLIN -CLAVULANATE (AUGMENTIN ) 500-125 MG TABLET    Take 1 tablet by  mouth 3 (three) times daily.   BENZONATATE  (TESSALON ) 200 MG CAPSULE    Take 1 capsule (200 mg total) by mouth 2 (two) times daily as needed for cough.  Previous Medications   ALPRAZOLAM  (XANAX ) 1 MG TABLET    Take 2 tablets (2 mg total) by mouth 2 (two) times daily.   CALCIUM  250 MG CAPS    Take 1 capsule by mouth 3 (three) times daily.   CYANOCOBALAMIN  (VITAMIN B12) 1000 MCG/ML INJECTION    Inject 1 mL (1,000 mcg total) into the muscle every 2 weeks   ESTARYLLA  0.25-35 MG-MCG TABLET    Take 1 tablet by mouth daily.   FAMOTIDINE  (PEPCID ) 20 MG TABLET    Take 1 tablet (20 mg total) by mouth 2 (two) times daily.   FERROUS FUMARATE  (HEMOCYTE - 106 MG FE) 324 (106 FE) MG TABS TABLET    Take 1 tablet (106 mg of iron  total) by mouth daily.   FLUOXETINE  (PROZAC ) 40 MG CAPSULE    Take 1 capsule (40 mg total) by mouth daily.   GABAPENTIN  (NEURONTIN ) 400 MG CAPSULE    Take 1 capsule (400 mg total) by mouth 2 (two) times daily.   GLUCOSE BLOOD (FREESTYLE LITE) TEST STRIP    USE TO CHECK BLOOD SUGAR 3 TIMES A DAY   LAMOTRIGINE  (LAMICTAL ) 150 MG TABLET    Take 1 tablet (150 mg total) by mouth daily.    LEVOCETIRIZINE (XYZAL ) 5 MG TABLET    Take 1 tablet (5 mg total) by mouth every evening.   LIDOCAINE  (XYLOCAINE ) 2 % SOLUTION    Use as directed 15 mLs in the mouth or throat as needed for mouth pain.   LISINOPRIL  (ZESTRIL ) 20 MG TABLET    Take 1 tablet (20 mg total) by mouth daily.   METRONIDAZOLE  (FLAGYL ) 500 MG TABLET    Take 1 tablet (500 mg total) by mouth 2 (two) times daily for 7 days.   NALOXONE (NARCAN) NASAL SPRAY 4 MG/0.1 ML    Place 1 spray into the nose once.   OMEPRAZOLE  (PRILOSEC) 40 MG CAPSULE    Take 1 capsule (40 mg total) by mouth 2 (two) times daily.   ONDANSETRON  (ZOFRAN -ODT) 4 MG DISINTEGRATING TABLET    Take 1 tablet (4 mg total) by mouth every 8 (eight) hours as needed.   OXYCODONE -ACETAMINOPHEN  (PERCOCET) 10-325 MG TABLET    Take 1 tablet by mouth every 6 (six) hours as needed for pain.   UBROGEPANT  (UBRELVY ) 50 MG TABS    1-2 tabs po once a day as needed for migraine headache   VITAMIN D , ERGOCALCIFEROL , (DRISDOL ) 1.25 MG (50000 UNIT) CAPS CAPSULE    Take 1 capsule (50,000 Units total) by mouth 2 (two) times a week.  Modified Medications   No medications on file  Discontinued Medications   No medications on file    Physical Exam: Vitals:   09/10/24 0923  BP: 128/84  Pulse: 71  Temp: 98.3 F (36.8 C)  TempSrc: Oral  SpO2: 96%  Weight: 240 lb 1.9 oz (108.9 kg)   Body mass index is 39.35 kg/m. BP Readings from Last 3 Encounters:  09/10/24 128/84  06/04/24 110/77  01/14/24 119/78   Wt Readings from Last 3 Encounters:  09/10/24 240 lb 1.9 oz (108.9 kg)  08/30/24 245 lb (111.1 kg)  06/29/24 245 lb (111.1 kg)    Physical Exam Constitutional:      Appearance: Normal appearance.  HENT:     Head: Normocephalic and atraumatic.  Right Ear: Tympanic membrane normal.     Left Ear: Tympanic membrane normal.     Mouth/Throat:     Pharynx: Posterior oropharyngeal erythema present. No oropharyngeal exudate.     Comments: Rt maxillary sinus  tenderness+ Cardiovascular:     Rate and Rhythm: Normal rate and regular rhythm.  Pulmonary:     Effort: Pulmonary effort is normal. No respiratory distress.     Breath sounds: Normal breath sounds. No wheezing.  Abdominal:     General: Bowel sounds are normal. There is no distension.     Tenderness: There is no abdominal tenderness. There is no guarding or rebound.     Comments:    Musculoskeletal:        General: No swelling or tenderness.  Lymphadenopathy:     Cervical: No cervical adenopathy.  Neurological:     Mental Status: She is alert. Mental status is at baseline.     Sensory: No sensory deficit.     Motor: No weakness.     Labs reviewed: Basic Metabolic Panel: Recent Labs    11/28/23 1446 12/12/23 1323 06/04/24 1514  NA 138 139 137  K 5.4* 3.7 4.2  CL 106 104 104  CO2 20 23 25   GLUCOSE 84 156* 55*  BUN 21 11 19   CREATININE 0.75 0.72 0.88  CALCIUM  8.8 9.1 8.9  TSH  --   --  1.04   Liver Function Tests: Recent Labs    06/04/24 1514  AST 11  ALT 9  BILITOT 0.3  PROT 6.2   No results for input(s): LIPASE, AMYLASE in the last 8760 hours. No results for input(s): AMMONIA in the last 8760 hours. CBC: Recent Labs    11/28/23 1446 06/04/24 1514  WBC 8.0 8.1  NEUTROABS  --  3,313  HGB 11.6* 14.3  HCT 36.6 45.3*  MCV 87.4 99.6  PLT 313 360   Lipid Panel: Recent Labs    06/04/24 1514  CHOL 269*  HDL 81  LDLCALC 149*  TRIG 243*  CHOLHDL 3.3   TSH: Recent Labs    06/04/24 1514  TSH 1.04   A1C: Lab Results  Component Value Date   HGBA1C 5.0 06/04/2024    Assessment & Plan Fatigue, unspecified type Flu/ covid neg Orders:   POC COVID-19 BinaxNow  Nasal congestion Take claritin, flonase  Orders:   POCT Influenza A/B  Acute non-recurrent maxillary sinusitis Rt maxillary sinus tenderness Will start augmentin  1 tab x 5 days Instructed patient to use claritin, flonase ,  Work note provided to patient  Orders:    amoxicillin -clavulanate (AUGMENTIN ) 500-125 MG tablet; Take 1 tablet by mouth 3 (three) times daily.  Acute cough Lungs clear  O2 sat 96% Will send tessalon   Orders:   benzonatate  (TESSALON ) 200 MG capsule; Take 1 capsule (200 mg total) by mouth 2 (two) times daily as needed for cough.   Return follow up with PCP.:   Tarica Harl

## 2024-09-14 ENCOUNTER — Telehealth (HOSPITAL_COMMUNITY): Admitting: Psychiatry

## 2024-10-24 ENCOUNTER — Encounter (HOSPITAL_COMMUNITY): Payer: Self-pay

## 2024-10-24 ENCOUNTER — Other Ambulatory Visit: Payer: Self-pay

## 2024-10-24 ENCOUNTER — Observation Stay (HOSPITAL_COMMUNITY)
Admission: EM | Admit: 2024-10-24 | Discharge: 2024-10-26 | Disposition: A | Discharge status: Home / Self Care | Payer: PRIVATE HEALTH INSURANCE | Attending: Family Medicine | Admitting: Family Medicine

## 2024-10-24 DIAGNOSIS — I951 Orthostatic hypotension: Secondary | ICD-10-CM | POA: Insufficient documentation

## 2024-10-24 DIAGNOSIS — F418 Other specified anxiety disorders: Secondary | ICD-10-CM | POA: Insufficient documentation

## 2024-10-24 DIAGNOSIS — E875 Hyperkalemia: Secondary | ICD-10-CM | POA: Insufficient documentation

## 2024-10-24 DIAGNOSIS — F39 Unspecified mood [affective] disorder: Secondary | ICD-10-CM | POA: Diagnosis present

## 2024-10-24 DIAGNOSIS — K219 Gastro-esophageal reflux disease without esophagitis: Secondary | ICD-10-CM | POA: Insufficient documentation

## 2024-10-24 DIAGNOSIS — R55 Syncope and collapse: Principal | ICD-10-CM | POA: Diagnosis present

## 2024-10-24 DIAGNOSIS — N179 Acute kidney failure, unspecified: Principal | ICD-10-CM | POA: Diagnosis present

## 2024-10-24 DIAGNOSIS — I10 Essential (primary) hypertension: Secondary | ICD-10-CM | POA: Insufficient documentation

## 2024-10-24 DIAGNOSIS — K279 Peptic ulcer, site unspecified, unspecified as acute or chronic, without hemorrhage or perforation: Secondary | ICD-10-CM | POA: Insufficient documentation

## 2024-10-24 DIAGNOSIS — Z79899 Other long term (current) drug therapy: Secondary | ICD-10-CM | POA: Insufficient documentation

## 2024-10-24 DIAGNOSIS — G894 Chronic pain syndrome: Secondary | ICD-10-CM | POA: Insufficient documentation

## 2024-10-24 DIAGNOSIS — R569 Unspecified convulsions: Secondary | ICD-10-CM | POA: Insufficient documentation

## 2024-10-24 LAB — CBG MONITORING, ED: Glucose-Capillary: 92 mg/dL (ref 70–99)

## 2024-10-24 MED ORDER — OXYCODONE-ACETAMINOPHEN 5-325 MG PO TABS
2.0000 | ORAL_TABLET | Freq: Once | ORAL | Status: AC
  Administered 2024-10-24: 2 via ORAL
  Filled 2024-10-24: qty 2

## 2024-10-24 NOTE — ED Triage Notes (Signed)
 Per EMS:  Unresponsive 15 minutes per bystander Found in her car Someone called EMS Aox2 when EMS arrived AMS Unable to tell time Repeating phrases Hx Seizure

## 2024-10-24 NOTE — ED Provider Notes (Signed)
 " [place] EMERGENCY DEPARTMENT AT [facility] Provider Note   CSN: [id number] Arrival date & time: 10/24/24  2123     Patient presents with: Seizures and Weakness   Belle Charlie is a 49 y.o. female.  {Add pertinent medical, surgical, social history, OB history to YEP:67052} Patient to ED after being found in her car in a parking lot unresponsive by bystander. EMS was called who reports patient was awake but with altered mental status. Disoriented, repeating words. Patient reports she was doing some packing at home, felt dizzy and lightheaded and went to her car to rest. She remembers getting to her car, and remembers paramedics on scene. She reports history of seizure remotely, not on medications, last seizure years ago. No recent sleep disturbance, illness/fever. She feels she is under significant stress. No recent new medications or medication changes. No chest, abdominal pain or headache. She reports back pain c/w history of chronic back pain.  The history is provided by the patient and the EMS personnel. No language interpreter was used.  Seizures Weakness Associated symptoms: seizures        Prior to Admission medications  Medication Sig Start Date End Date Taking? Authorizing Provider  ALPRAZolam  (XANAX ) 1 MG tablet Take 2 tablets (2 mg total) by mouth 2 (two) times daily. 06/09/24   McGowen, Aleene DEL, MD  amoxicillin -clavulanate (AUGMENTIN ) 500-125 MG tablet Take 1 tablet by mouth 3 (three) times daily. 09/10/24   Sherlynn Madden, MD  benzonatate  (TESSALON ) 200 MG capsule Take 1 capsule (200 mg total) by mouth 2 (two) times daily as needed for cough. 09/10/24   Sherlynn Madden, MD  Calcium  250 MG CAPS Take 1 capsule by mouth 3 (three) times daily.    [provider]  cyanocobalamin  (VITAMIN B12) 1000 MCG/ML injection Inject 1 mL (1,000 mcg total) into the muscle every 2 weeks 06/09/24   McGowen, Aleene DEL, MD  ESTARYLLA  0.25-35 MG-MCG tablet Take 1  tablet by mouth daily. 01/16/23   Armond Cape, MD  famotidine  (PEPCID ) 20 MG tablet Take 1 tablet (20 mg total) by mouth 2 (two) times daily. 06/09/24   McGowen, Aleene DEL, MD  Ferrous Fumarate  (HEMOCYTE - 106 MG FE) 324 (106 Fe) MG TABS tablet Take 1 tablet (106 mg of iron  total) by mouth daily. 06/09/24   McGowen, Aleene DEL, MD  FLUoxetine  (PROZAC ) 40 MG capsule Take 1 capsule (40 mg total) by mouth daily. 08/30/24   Arfeen, Leni DASEN, MD  gabapentin  (NEURONTIN ) 400 MG capsule Take 1 capsule (400 mg total) by mouth 2 (two) times daily. Patient taking differently: Take 300 mg by mouth 2 (two) times daily. 08/30/24   Arfeen, Leni T, MD  glucose blood (FREESTYLE LITE) test strip USE TO CHECK BLOOD SUGAR 3 TIMES A DAY 06/06/20   McGowen, Aleene DEL, MD  lamoTRIgine  (LAMICTAL ) 150 MG tablet Take 1 tablet (150 mg total) by mouth daily. 08/30/24   Arfeen, Leni DASEN, MD  levocetirizine (XYZAL ) 5 MG tablet Take 1 tablet (5 mg total) by mouth every evening. 08/26/24   McGowen, Aleene DEL, MD  lidocaine  (XYLOCAINE ) 2 % solution Use as directed 15 mLs in the mouth or throat as needed for mouth pain. 07/05/24   Moishe Chiquita HERO, NP  lisinopril  (ZESTRIL ) 20 MG tablet Take 1 tablet (20 mg total) by mouth daily. 06/09/24   McGowen, Aleene DEL, MD  metroNIDAZOLE  (FLAGYL ) 500 MG tablet Take 1 tablet (500 mg total) by mouth 2 (two) times daily for 7 days.  Patient not taking: Reported on 09/10/2024 03/05/24     naloxone  (NARCAN ) nasal spray 4 mg/0.1 mL Place 1 spray into the nose once. Patient not taking: Reported on 09/10/2024 02/14/23   [provider]  omeprazole  (PRILOSEC) 40 MG capsule Take 1 capsule (40 mg total) by mouth 2 (two) times daily. 06/09/24   McGowen, Aleene DEL, MD  ondansetron  (ZOFRAN -ODT) 4 MG disintegrating tablet Take 1 tablet (4 mg total) by mouth every 8 (eight) hours as needed. 06/09/24   McGowen, Aleene DEL, MD  oxyCODONE -acetaminophen  (PERCOCET) 10-325 MG tablet Take 1 tablet by mouth every 6 (six) hours as needed  for pain. 05/31/19   [provider]  Ubrogepant  (UBRELVY ) 50 MG TABS 1-2 tabs po once a day as needed for migraine headache 06/09/24   McGowen, Aleene DEL, MD  Vitamin D , Ergocalciferol , (DRISDOL ) 1.25 MG (50000 UNIT) CAPS capsule Take 1 capsule (50,000 Units total) by mouth 2 (two) times a week. 02/05/24   McGowen, Aleene DEL, MD    Allergies: Ibuprofen    Review of Systems  Neurological:  Positive for seizures and weakness.    Updated Vital Signs BP 106/62   Pulse 86   Temp 97.9 F (36.6 C) (Oral)   Resp 14   Ht 5' 6 (1.676 m)   Wt 108.9 kg   SpO2 100%   BMI 38.74 kg/m   Physical Exam Constitutional:      Appearance: Normal appearance. She is well-developed. She is not toxic-appearing.     Comments: Mildly somnolent, soft spoken but awake and oriented.  HENT:     Head: Normocephalic.  Eyes:     Extraocular Movements: Extraocular movements intact.     Pupils: Pupils are equal, round, and reactive to light.  Neck:     Vascular: No carotid bruit.  Cardiovascular:     Rate and Rhythm: Normal rate and regular rhythm.     Heart sounds: No murmur heard. Pulmonary:     Effort: Pulmonary effort is normal.     Breath sounds: Normal breath sounds. No wheezing, rhonchi or rales.  Abdominal:     Palpations: Abdomen is soft.     Tenderness: There is no abdominal tenderness. There is no guarding or rebound.  Musculoskeletal:        General: Normal range of motion.     Cervical back: Normal range of motion and neck supple.  Skin:    General: Skin is warm and dry.  Neurological:     General: No focal deficit present.     Mental Status: She is alert and oriented to person, place, and time.     GCS: GCS eye subscore is 4. GCS verbal subscore is 5. GCS motor subscore is 6.     Cranial Nerves: No dysarthria or facial asymmetry.     Sensory: Sensation is intact.     Motor: No pronator drift.     Coordination: Finger-Nose-Finger Test normal.     Gait: Gait normal.     (all  labs ordered are listed, but only abnormal results are displayed) Labs Reviewed  CBG MONITORING, ED    EKG: EKG Interpretation Date/Time:  Sunday October 24 2024 21:36:27 EST Ventricular Rate:  81 PR Interval:  144 QRS Duration:  93 QT Interval:  411 QTC Calculation: 478 R Axis:   4  Text Interpretation: Sinus rhythm Consider right atrial enlargement Left ventricular hypertrophy Artifact in lead(s) I II aVR aVL No significant change since last tracing Confirmed by Emil Share [phone]  on 10/24/2024 10:30:08 PM  Radiology: No results found.  {Document cardiac monitor, telemetry assessment procedure when appropriate:32947} Procedures   Medications Ordered in the ED  oxyCODONE -acetaminophen  (PERCOCET/ROXICET) 5-325 MG per tablet 2 tablet (2 tablets Oral Given 10/24/24 2322)    Clinical Course as of 10/24/24 2341  Sun Oct 24, 2024  2339 Patient to eD after period of unconsciousness. H/O seizures remotely but endorses aura of dizziness and lightheadedness similar to today's episode. No witnessed seizure.   Discussed with ED attending, Dr. Emil. Will monitor over time as she is returning to a baseline mental status. Likely seizure today with no other sign of illness, or neurologic deficit. No work up at this time if she continues to return to baseline. Patient is comfortable with plan.  [SU]    Clinical Course User Index [SU] Odell Balls, PA-C   {Click here for ABCD2, HEART and other calculators REFRESH Note before signing:1}                              Medical Decision Making  ***  {Document critical care time when appropriate  Document review of labs and clinical decision tools ie CHADS2VASC2, etc  Document your independent review of radiology images and any outside records  Document your discussion with family members, caretakers and with consultants  Document social determinants of health affecting pt's care  Document your decision making why or why not admission,  treatments were needed:32947:::1}   Final diagnoses:  None    ED Discharge Orders     None        "

## 2024-10-25 ENCOUNTER — Observation Stay (HOSPITAL_COMMUNITY): Payer: PRIVATE HEALTH INSURANCE

## 2024-10-25 ENCOUNTER — Observation Stay (HOSPITAL_BASED_OUTPATIENT_CLINIC_OR_DEPARTMENT_OTHER): Payer: PRIVATE HEALTH INSURANCE

## 2024-10-25 DIAGNOSIS — R55 Syncope and collapse: Secondary | ICD-10-CM | POA: Diagnosis not present

## 2024-10-25 DIAGNOSIS — R569 Unspecified convulsions: Secondary | ICD-10-CM | POA: Diagnosis not present

## 2024-10-25 LAB — CBC WITH DIFFERENTIAL/PLATELET
Abs Immature Granulocytes: 0.03 K/uL (ref 0.00–0.07)
Basophils Absolute: 0.1 K/uL (ref 0.0–0.1)
Basophils Relative: 1 %
Eosinophils Absolute: 0.1 K/uL (ref 0.0–0.5)
Eosinophils Relative: 2 %
HCT: 39.1 % (ref 36.0–46.0)
Hemoglobin: 12.5 g/dL (ref 12.0–15.0)
Immature Granulocytes: 0 %
Lymphocytes Relative: 34 %
Lymphs Abs: 2.6 K/uL (ref 0.7–4.0)
MCH: 31.3 pg (ref 26.0–34.0)
MCHC: 32 g/dL (ref 30.0–36.0)
MCV: 97.8 fL (ref 80.0–100.0)
Monocytes Absolute: 0.8 K/uL (ref 0.1–1.0)
Monocytes Relative: 10 %
Neutro Abs: 4.1 K/uL (ref 1.7–7.7)
Neutrophils Relative %: 53 %
Platelets: 346 K/uL (ref 150–400)
RBC: 4 MIL/uL (ref 3.87–5.11)
RDW: 14.5 % (ref 11.5–15.5)
WBC: 7.7 K/uL (ref 4.0–10.5)
nRBC: 0 % (ref 0.0–0.2)

## 2024-10-25 LAB — BASIC METABOLIC PANEL WITH GFR
Anion gap: 12 (ref 5–15)
BUN: 23 mg/dL — ABNORMAL HIGH (ref 6–20)
CO2: 25 mmol/L (ref 22–32)
Calcium: 8.8 mg/dL — ABNORMAL LOW (ref 8.9–10.3)
Chloride: 101 mmol/L (ref 98–111)
Creatinine, Ser: 1.95 mg/dL — ABNORMAL HIGH (ref 0.44–1.00)
GFR, Estimated: 31 mL/min — ABNORMAL LOW
Glucose, Bld: 88 mg/dL (ref 70–99)
Potassium: 4 mmol/L (ref 3.5–5.1)
Sodium: 139 mmol/L (ref 135–145)

## 2024-10-25 LAB — URINALYSIS, ROUTINE W REFLEX MICROSCOPIC
Bilirubin Urine: NEGATIVE
Glucose, UA: NEGATIVE mg/dL
Hgb urine dipstick: NEGATIVE
Ketones, ur: NEGATIVE mg/dL
Leukocytes,Ua: NEGATIVE
Nitrite: NEGATIVE
Protein, ur: 30 mg/dL — AB
Specific Gravity, Urine: 1.031 — ABNORMAL HIGH (ref 1.005–1.030)
pH: 5 (ref 5.0–8.0)

## 2024-10-25 LAB — ECHOCARDIOGRAM COMPLETE
Area-P 1/2: 3.91 cm2
Calc EF: 64.3 %
Height: 66 in
S' Lateral: 3.4 cm
Single Plane A2C EF: 65.4 %
Single Plane A4C EF: 63.1 %
Weight: 3840 [oz_av]

## 2024-10-25 LAB — COMPREHENSIVE METABOLIC PANEL WITH GFR
ALT: 23 U/L (ref 0–44)
AST: 15 U/L (ref 15–41)
Albumin: 3.7 g/dL (ref 3.5–5.0)
Alkaline Phosphatase: 74 U/L (ref 38–126)
Anion gap: 10 (ref 5–15)
BUN: 22 mg/dL — ABNORMAL HIGH (ref 6–20)
CO2: 22 mmol/L (ref 22–32)
Calcium: 8.4 mg/dL — ABNORMAL LOW (ref 8.9–10.3)
Chloride: 107 mmol/L (ref 98–111)
Creatinine, Ser: 1.52 mg/dL — ABNORMAL HIGH (ref 0.44–1.00)
GFR, Estimated: 42 mL/min — ABNORMAL LOW
Glucose, Bld: 95 mg/dL (ref 70–99)
Potassium: 4.1 mmol/L (ref 3.5–5.1)
Sodium: 138 mmol/L (ref 135–145)
Total Bilirubin: 0.4 mg/dL (ref 0.0–1.2)
Total Protein: 5.9 g/dL — ABNORMAL LOW (ref 6.5–8.1)

## 2024-10-25 LAB — CBG MONITORING, ED: Glucose-Capillary: 80 mg/dL (ref 70–99)

## 2024-10-25 LAB — HIV ANTIBODY (ROUTINE TESTING W REFLEX): HIV Screen 4th Generation wRfx: NONREACTIVE

## 2024-10-25 LAB — PREGNANCY, URINE: Preg Test, Ur: NEGATIVE

## 2024-10-25 MED ORDER — FAMOTIDINE 20 MG PO TABS: 20.0000 mg | ORAL_TABLET | Freq: Every day | ORAL | Status: DC | PRN

## 2024-10-25 MED ORDER — NORGESTIMATE-ETH ESTRADIOL 0.25-35 MG-MCG PO TABS
1.0000 | ORAL_TABLET | Freq: Every day | ORAL | Status: DC
  Administered 2024-10-26: 1 via ORAL

## 2024-10-25 MED ORDER — OXYCODONE HCL 5 MG PO TABS
5.0000 mg | ORAL_TABLET | ORAL | Status: DC | PRN
  Administered 2024-10-25 – 2024-10-26 (×4): 5 mg via ORAL
  Filled 2024-10-25 (×5): qty 1

## 2024-10-25 MED ORDER — ACETAMINOPHEN 325 MG PO TABS: 650.0000 mg | ORAL_TABLET | Freq: Four times a day (QID) | ORAL | Status: DC | PRN

## 2024-10-25 MED ORDER — LORATADINE 10 MG PO TABS
10.0000 mg | ORAL_TABLET | Freq: Every evening | ORAL | Status: DC
  Administered 2024-10-25: 10 mg via ORAL
  Filled 2024-10-25: qty 1

## 2024-10-25 MED ORDER — SODIUM CHLORIDE 0.9 % IV BOLUS
1000.0000 mL | Freq: Once | INTRAVENOUS | Status: AC
  Administered 2024-10-25: 1000 mL via INTRAVENOUS

## 2024-10-25 MED ORDER — PANTOPRAZOLE SODIUM 40 MG PO TBEC
40.0000 mg | DELAYED_RELEASE_TABLET | Freq: Two times a day (BID) | ORAL | Status: DC
  Administered 2024-10-25 – 2024-10-26 (×3): 40 mg via ORAL
  Filled 2024-10-25 (×3): qty 1

## 2024-10-25 MED ORDER — OXYCODONE-ACETAMINOPHEN 5-325 MG PO TABS
1.0000 | ORAL_TABLET | ORAL | Status: DC | PRN
  Administered 2024-10-25 – 2024-10-26 (×5): 1 via ORAL
  Filled 2024-10-25 (×6): qty 1

## 2024-10-25 MED ORDER — ACETAMINOPHEN 500 MG PO TABS
1000.0000 mg | ORAL_TABLET | Freq: Once | ORAL | Status: AC
  Administered 2024-10-25: 1000 mg via ORAL
  Filled 2024-10-25: qty 2

## 2024-10-25 MED ORDER — LAMOTRIGINE 150 MG PO TABS
150.0000 mg | ORAL_TABLET | Freq: Every day | ORAL | Status: DC
  Filled 2024-10-25: qty 1

## 2024-10-25 MED ORDER — NALOXONE HCL 0.4 MG/ML IJ SOLN: 0.4000 mg | INTRAMUSCULAR | Status: DC | PRN

## 2024-10-25 MED ORDER — LAMOTRIGINE 25 MG PO TABS
150.0000 mg | ORAL_TABLET | Freq: Every day | ORAL | Status: DC
  Administered 2024-10-25: 150 mg via ORAL
  Filled 2024-10-25: qty 1

## 2024-10-25 MED ORDER — GABAPENTIN 400 MG PO CAPS
400.0000 mg | ORAL_CAPSULE | Freq: Two times a day (BID) | ORAL | Status: DC
  Administered 2024-10-25 – 2024-10-26 (×3): 400 mg via ORAL
  Filled 2024-10-25 (×3): qty 1

## 2024-10-25 MED ORDER — ACETAMINOPHEN 650 MG RE SUPP: 650.0000 mg | Freq: Four times a day (QID) | RECTAL | Status: DC | PRN

## 2024-10-25 MED ORDER — ENOXAPARIN SODIUM 60 MG/0.6ML IJ SOSY
50.0000 mg | PREFILLED_SYRINGE | INTRAMUSCULAR | Status: DC
  Administered 2024-10-25: 50 mg via SUBCUTANEOUS
  Filled 2024-10-25: qty 0.6

## 2024-10-25 MED ORDER — SODIUM CHLORIDE 0.9 % IV SOLN: INTRAVENOUS | Status: AC

## 2024-10-25 MED ORDER — OXYCODONE-ACETAMINOPHEN 10-325 MG PO TABS: 1.0000 | ORAL_TABLET | ORAL | Status: DC | PRN

## 2024-10-25 MED ORDER — FLUOXETINE HCL 20 MG PO CAPS
40.0000 mg | ORAL_CAPSULE | Freq: Every day | ORAL | Status: DC
  Administered 2024-10-25: 40 mg via ORAL
  Filled 2024-10-25: qty 2

## 2024-10-25 MED ORDER — FLUOXETINE HCL 20 MG PO CAPS: 40.0000 mg | ORAL_CAPSULE | Freq: Every day | ORAL | Status: DC

## 2024-10-25 MED ORDER — ALPRAZOLAM 0.5 MG PO TABS
2.0000 mg | ORAL_TABLET | Freq: Two times a day (BID) | ORAL | Status: DC
  Administered 2024-10-25 – 2024-10-26 (×3): 2 mg via ORAL
  Filled 2024-10-25: qty 4
  Filled 2024-10-25 (×2): qty 8

## 2024-10-25 NOTE — ED Notes (Addendum)
 Unable to draw labs from IV. Phleb unable to get labs, attempted x2.

## 2024-10-25 NOTE — Progress Notes (Signed)
 Subjective: Patient admitted this morning, see detailed H&P by Dr Alfornia  49 y.o. female with medical history significant of hypertension, hyperlipidemia, anxiety, depression, chronic neck pain, GERD, PUD, iron /B12/vitamin D  deficiency, history of Roux-en-Y bypass surgery, migraine, history of seizure in 2015 presenting to the ED after she was found unresponsive in her car in a parking lot and a bystander called EMS.  On EMS arrival, patient was awake but confused/disoriented and repeating words. Vital signs stable on arrival to the ED but orthostatics checked later positive with SBP dropping from 110 to 88 with standing.  Labs showing a leukocytosis or anemia, not hypoglycemic, BUN 23, creatinine 1.95 (baseline 0.8), UA not suggestive of infection.  EKG showing sinus rhythm and no acute ischemic changes.  Patient was given Tylenol , Percocet, and 2 L IV fluid boluses.  [facility] called to admit.   Vitals:   10/25/24 0710 10/25/24 0747  BP:    Pulse: 84   Resp:    Temp:  98.4 F (36.9 C)  SpO2: 95%       A/P  Syncope versus seizure -Patient went for lunch and had episode in the parking lot the last; thinks she remembers was backing up her car and after that she was in ambulance -She had a similar episode 10 years ago where she was diagnosed with seizure, she has been followed by neurology -She had another episode 3 years ago after lunch -She was found to be positive orthostatic hypotension in the ED -Patient was confused after the episode, concern for seizure; she denies biting her tongue but says that her pants were wet -Will consult neurology for further workup for seizure    Orthostatic hypotension SBP dropped from 110 to 88 with standing.  Hold home antihypertensive.   Continue IV fluid hydration and repeat orthostatics in the morning.  Fall precautions.   AKI Likely due to dehydration.  Creatinine 1.95 (baseline 0.8).  Continue IV fluid hydration and monitor renal function.  Avoid  nephrotoxic agents/hold lisinopril .   Mood disorder Continue home Xanax , Prozac , and Lamictal .   GERD/PUD Continue PPI, H2 blocker PRN.   Chronic pain syndrome Continue gabapentin  and Percocet.   Sabas GORMAN Brod [facility]

## 2024-10-25 NOTE — Consult Note (Addendum)
 NEUROLOGY CONSULT NOTE   Date of service: October 25, 2024 Patient Name: Toni [name] MRN:  [id number] DOB:  1976/07/10 Chief Complaint: Found unresponsive Requesting Provider: Drusilla Sabas RAMAN, MD  History of Present Illness  Toni [name] is a 49 y.o. female with PMHx of HTN, HLD, anxiety, depression, chronic neck pain, GERD, peptic ulcer disease, iron /B12/vitamin D  deficiency, Roux-en-Y gastric bypass, migraines, and remote seizure history (2015) who presented after being found unresponsive in her car in a parking lot.  Per EMS, the patient was initially awake but confused and disoriented, repetitively speaking upon their arrival. Vital signs were stable in the ED, though orthostatic vitals were positive with systolic blood pressure dropping from 110 to 88 upon standing. Cr 1.95 from baseline 0.8. She received IV fluids, Tylenol , and Percocet and was admitted for further evaluation.  Patient reports three episodes of loss of consciousness, including a seizure approximately 10 years ago while traveling in [place] , and another episode in 2023 during a lunch break while sitting in her parked car, for which she did not seek follow-up. She also recalls multiple episodes over the past few years where she loses small parts of time. She states her neurologist said these are due to a stress response and has never been prescribed antiseizure medications.  Regarding the current episode, she recalls sitting in her parked car during a lunch break, with the last memory being inside the car, and then awakening in the emergency department. She denies tongue biting, incontinence, or any weakness or sensory changes. She does endorse prolonged confusion after her syncope event and complains of an achy pain throughout her entire body. She endorses chronic use of xanax  daily for anxiety (on for years, including prior to seizures), as well as gabapentin  and oxycodone  for chronic back pain. She reports migraines once to  twice weekly, treated with ubrogepant . She denies illicit drug use.  NIHSS 0 with no focal neurologic deficits; stroke metrics documented for completeness   ROS  Comprehensive ROS performed and pertinent positives documented in HPI   Past History   Past Medical History:  Diagnosis Date   Acute renal failure 04/26/2021   prerenal azotemia   Allergic rhinitis    Altered sensorium due to hypoglycemia 03/2016   Anxiety and depression    started with PPD   Chronic neck pain    Myofascial pain syndrome per [facility], oxycodone  per their office pain med contract.  (Normal cervical MRI 2010 per ortho notes).   Easy bruisability 2011   Hx of: saw Hematologist (Dr. Freddie) and w/u neg for von Willebrand desease.   GERD (gastroesophageal reflux disease)    hx gastric ulcer and upper GI bleed, ? 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 Pt to start FeSO4 325 qd 10/28/19   Irregular menses 03/2018   GYN started [facility]  0.25-0.35.  [facility] as of 10/2018.  u/s wnl.  Endom bx NEG. OCPs helped.   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 Kassie   Seizure [facility]) 2015   s/p eval with  neuro, no medication, no events since   Syncope    with ? seizure in 2015, eval with neruo   Vitamin B12 deficiency    Vitamin D  deficiency     Past Surgical History:  Procedure Laterality Date   CESAREAN SECTION  2006   COLONOSCOPY W/ POLYPECTOMY     adenomas 11/17/23-->recall 7 yrs   ELBOW X-RAY Left 12/07/2019   ENDOMETRIAL BIOPSY     EYE SURGERY Bilateral    2001   FINGER X-RAY Right 12/07/2019   ROUX-EN-Y  GASTRIC BYPASS  01/05/2002   TONSILLECTOMY AND ADENOIDECTOMY     TUBAL LIGATION  2006   tubes in ears both Bilateral     Family History: Family History  Problem Relation Age of Onset   Arthritis Mother    Hyperlipidemia Mother    Heart disease Mother    Diabetes Mother    Asthma Mother    Aneurysm Father        deceased   Lung cancer Maternal Aunt        smoker   Lung cancer Maternal Aunt        smoker   Colon cancer Maternal Grandmother    Esophageal cancer Neg Hx    Rectal cancer Neg Hx    Stomach cancer Neg Hx     Social History  reports that she has never smoked. She has never used smokeless tobacco. She reports current alcohol  use. She reports that she does not use drugs.  Allergies[1]  Medications  Current Medications[2]  Vitals   Vitals:   Nov 01, 2024 0418 11/01/24 0710 Nov 01, 2024 0747 2024-11-01 0845  BP: (!) 110/59   (!) 100/57  Pulse: 81 84  81  Resp: 19   10  Temp:   98.4 F (36.9 C)   TempSrc:      SpO2: 99% 95%  95%  Weight:      Height:        Body mass index is 38.74 kg/m.   Physical Exam   Constitutional: Appears well-developed and well-nourished.  Psych: Affect appropriate to situation.  Eyes: No scleral injection.  HENT: No OP obstruction.  Head: Normocephalic.  Cardiovascular: Normal rate and regular rhythm.  Respiratory: Effort normal, non-labored breathing.  GI: Soft.  No distension. There is no tenderness.  Skin: WDI.   Neurologic Examination    Neuro: Mental Status: Patient is awake, alert, oriented to person, place, month, year, and situation. Patient is able to give a clear and coherent history. No signs of aphasia or neglect Cranial Nerves: II: Visual Fields are full. Pupils are equal, round, and reactive to light.   III,IV, VI: EOMI without ptosis or diploplia.  V: Facial sensation is symmetric to temperature VII: Facial movement is symmetric.  VIII: hearing is intact to voice X: Uvula elevates symmetrically XII:  tongue is midline without atrophy or fasciculations.  Motor: Tone is normal. Bulk is normal. 5/5 strength was present in all four extremities.  Sensory: Sensation is symmetric to light touch  in the arms and legs.      Labs/Imaging/Neurodiagnostic studies   CBC:  Recent Labs  Lab November 01, 2024 0241  WBC 7.7  NEUTROABS 4.1  HGB 12.5  HCT 39.1  MCV 97.8  PLT 346   Basic Metabolic Panel:  Lab Results  Component Value Date   NA 138 11/01/24   K 4.1 11-01-24   CO2 22 11-01-2024   GLUCOSE 95 11-01-2024   BUN 22 (H) 11/01/2024   CREATININE 1.52 (H) 2024/11/01  CALCIUM  8.4 (L) 10/25/2024   GFRNONAA 42 (L) 10/25/2024   GFRAA 41 (L) 05/15/2020   Lipid Panel:  Lab Results  Component Value Date   LDLCALC 149 (H) 06/04/2024   HgbA1c:  Lab Results  Component Value Date   HGBA1C 5.0 06/04/2024   Urine Drug Screen:     Component Value Date/Time   LABOPIA NONE DETECTED 03/21/2016 1350   COCAINSCRNUR NONE DETECTED 03/21/2016 1350   LABBENZ POSITIVE (A) 03/21/2016 1350   AMPHETMU NONE DETECTED 03/21/2016 1350   THCU NONE DETECTED 03/21/2016 1350   LABBARB NONE DETECTED 03/21/2016 1350    Alcohol  Level No results found for: [facility] INR  Lab Results  Component Value Date   INR 1.11 03/21/2016   APTT  Lab Results  Component Value Date   APTT 30 03/21/2016   AED levels: No results found for: PHENYTOIN, ZONISAMIDE, LAMOTRIGINE , LEVETIRACETA  CT angio Head and Neck with contrast(Personally reviewed): CTA Neck: Mild irregularity of the right ICA at the C1-C2 level without intimal flap, pseudoaneurysm, or significant stenosis to suggest vessel injury. Additionally, there is streak artifact in this region, which may accentuate the finding.   CTA Head: Limited evaluation due to motion without large vessel occlusion or proximal high-grade stenosis.  MR Angio head without contrast and Carotid Duplex BL(Personally reviewed): Normal intracranial MRA. Normal MRA of the  neck. Previously questioned irregularity about the distal cervical right ICA not seen on this exam.  MRI Brain(Personally reviewed): Normal brain MRI for age. No acute intracranial abnormality or findings to explain patient's symptoms identified.  Neurodiagnostics rEEG:  Pending  ASSESSMENT   Toni [name] is a 49 y.o. female with remote seizure history and multiple prior episodes of transient loss of consciousness, now presenting after being found unresponsive in her parked car. Patient has returned to baseline mental status.  The differential includes seizure vs syncope. Given her history of prior unexplained LOC episodes and remote seizure history, epileptic activity cannot be fully excluded.   RECOMMENDATIONS  - Overnight EEG - Continue with plan as outlined by primary team - Neurology will continue to follow  ______________________________________________________________________    Signed, Alan Maiden, MD [facility]    Attending Neurohospitalist Addendum Patient seen and examined with APP/Resident. Agree with the history and physical as documented above. Agree with the plan as documented, which I helped formulate. I have independently reviewed the chart, obtained history, review of systems and examined the patient.I have personally reviewed pertinent head/neck/spine imaging (CT/MRI).  Seen and examined.  Patient has had multiple episodes concerning for syncope versus seizure-with confounders such as sedating medications.  She has been told she has stress seizures but never been worked up with long-term EEG.  I think it would be prudent to do overnight EEG.  No need for antiepileptics for now.  MRI after EEG.  Will follow.  Plan relayed to Dr. Drusilla  Please feel free to call with any questions.  -- Eligio Lav, MD Neurologist [facility] Pager: [phone]     [1]  Allergies Allergen Reactions   Ibuprofen Nausea Only    Causes ulcers   [2]  Current Facility-Administered Medications:    0.9 %  sodium chloride  infusion, , Intravenous, Continuous, Alfornia Madison, MD, Last Rate: 125 mL/hr at 10/25/24 0647, New Bag at 10/25/24 0647   ALPRAZolam  (XANAX ) tablet 2 mg, 2 mg, Oral, BID, Rathore, Vasundhra, MD   enoxaparin  (LOVENOX ) injection 50 mg, 50 mg, Subcutaneous, Q24H, Rathore, Vasundhra, MD   famotidine  (PEPCID ) tablet 20 mg,  20 mg, Oral, Daily PRN, Alfornia Madison, MD   FLUoxetine  (PROZAC ) capsule 40 mg, 40 mg, Oral, QHS, Rathore, Vasundhra, MD   gabapentin  (NEURONTIN ) capsule 400 mg, 400 mg, Oral, BID, Rathore, Vasundhra, MD, 400 mg at 10/25/24 1103   lamoTRIgine  (LAMICTAL ) tablet 150 mg, 150 mg, Oral, QHS, Rathore, Vasundhra, MD   loratadine  (CLARITIN ) tablet 10 mg, 10 mg, Oral, QPM, Rathore, Vasundhra, MD   naloxone  (NARCAN ) injection 0.4 mg, 0.4 mg, Intravenous, PRN, Rathore, Vasundhra, MD   norgestimate -ethinyl estradiol  (ORTHO-CYCLEN) 0.25-35 MG-MCG tablet 1 tablet, 1 tablet, Oral, Daily, Alfornia Madison, MD   oxyCODONE -acetaminophen  (PERCOCET/ROXICET) 5-325 MG per tablet 1 tablet, 1 tablet, Oral, Q4H PRN, 1 tablet at 10/25/24 0744 **AND** oxyCODONE  (Oxy IR/ROXICODONE ) immediate release tablet 5 mg, 5 mg, Oral, Q4H PRN, Laron Agent, RPH   pantoprazole  (PROTONIX ) EC tablet 40 mg, 40 mg, Oral, BID, Rathore, Vasundhra, MD, 40 mg at 10/25/24 1103  Current Outpatient Medications:    ALPRAZolam  (XANAX ) 1 MG tablet, Take 2 tablets (2 mg total) by mouth 2 (two) times daily., Disp: 120 tablet, Rfl: 5   Calcium  250 MG CAPS, Take 1 capsule by mouth 3 (three) times daily., Disp: , Rfl:    cyanocobalamin  (VITAMIN B12) 1000 MCG/ML injection, Inject 1 mL (1,000 mcg total) into the muscle every 2 weeks, Disp: 10 mL, Rfl: 0   ESTARYLLA  0.25-35 MG-MCG tablet, Take 1 tablet by mouth daily., Disp: , Rfl:    famotidine  (PEPCID ) 20 MG tablet, Take 1 tablet (20 mg total) by mouth 2 (two) times daily. (Patient taking differently:  Take 20 mg by mouth daily as needed for heartburn.), Disp: 180 tablet, Rfl: 1   Ferrous Fumarate  (HEMOCYTE - 106 MG FE) 324 (106 Fe) MG TABS tablet, Take 1 tablet (106 mg of iron  total) by mouth daily. (Patient taking differently: Take 1 tablet by mouth every other day.), Disp: 30 tablet, Rfl: 6   FLUoxetine  (PROZAC ) 40 MG capsule, Take 1 capsule (40 mg total) by mouth daily. (Patient taking differently: Take 40 mg by mouth at bedtime.), Disp: 90 capsule, Rfl: 0   gabapentin  (NEURONTIN ) 400 MG capsule, Take 1 capsule (400 mg total) by mouth 2 (two) times daily., Disp: 60 capsule, Rfl: 2   lamoTRIgine  (LAMICTAL ) 150 MG tablet, Take 1 tablet (150 mg total) by mouth daily. (Patient taking differently: Take 150 mg by mouth at bedtime.), Disp: 90 tablet, Rfl: 0   levocetirizine (XYZAL ) 5 MG tablet, Take 1 tablet (5 mg total) by mouth every evening., Disp: 30 tablet, Rfl: 2   lisinopril  (ZESTRIL ) 20 MG tablet, Take 1 tablet (20 mg total) by mouth daily., Disp: 90 tablet, Rfl: 1   omeprazole  (PRILOSEC) 40 MG capsule, Take 1 capsule (40 mg total) by mouth 2 (two) times daily., Disp: 180 capsule, Rfl: 1   oxyCODONE -acetaminophen  (PERCOCET) 10-325 MG tablet, Take 1 tablet by mouth every 4 (four) hours as needed for pain., Disp: , Rfl:    Ubrogepant  (UBRELVY ) 50 MG TABS, 1-2 tabs po once a day as needed for migraine headache, Disp: 30 tablet, Rfl: 1   Vitamin D , Ergocalciferol , (DRISDOL ) 1.25 MG (50000 UNIT) CAPS capsule, Take 1 capsule (50,000 Units total) by mouth 2 (two) times a week., Disp: 24 capsule, Rfl: 1   benzonatate  (TESSALON ) 200 MG capsule, Take 1 capsule (200 mg total) by mouth 2 (two) times daily as needed for cough. (Patient not taking: Reported on 10/25/2024), Disp: 20 capsule, Rfl: 0   glucose blood (FREESTYLE LITE) test strip, USE TO  CHECK BLOOD SUGAR 3 TIMES A DAY, Disp: 200 strip, Rfl: 12   naloxone  (NARCAN ) nasal spray 4 mg/0.1 mL, Place 1 spray into the nose once. (Patient not taking: No sig  reported), Disp: , Rfl:    ondansetron  (ZOFRAN -ODT) 4 MG disintegrating tablet, Take 1 tablet (4 mg total) by mouth every 8 (eight) hours as needed. (Patient not taking: Reported on 10/25/2024), Disp: 20 tablet, Rfl: 1

## 2024-10-25 NOTE — Progress Notes (Signed)
 RN to pt bedside. Pt was sleepy and hard to arouse at first. Pt oriented x2 at first. Pt would answer RN, but some of what pt stated did not make sense to questions asked. VS stable other than pain 9/10 for HA. RN pressed EEG button and continued to assess situation. Hospitalist alerted as well as Dr. Arora from neuro. No new orders at this time.

## 2024-10-25 NOTE — ED Notes (Signed)
 Son brought patient birth control pills and she took it this morning

## 2024-10-25 NOTE — Progress Notes (Signed)
" °  Echocardiogram 2D Echocardiogram has been performed.  Toni [name] 10/25/2024, 2:46 PM "

## 2024-10-25 NOTE — Progress Notes (Cosign Needed Addendum)
 LTM EEG hooked up and running - no initial skin breakdown - push button tested - [facility] not monitoring pt while in ED.

## 2024-10-25 NOTE — Progress Notes (Signed)
 EEG equipment transferred to 1S14 with patient. [facility] monitoring.

## 2024-10-25 NOTE — H&P (Signed)
 " History and Physical    Toni [name] FMW:[id number] DOB: 01-05-1976 DOA: 10/24/2024  PCP: Candise Aleene DEL, MD  Patient coming from: Home  Chief Complaint: Unresponsiveness  HPI: Toni [name] is a 49 y.o. female with medical history significant of hypertension, hyperlipidemia, anxiety, depression, chronic neck pain, GERD, PUD, iron /B12/vitamin D  deficiency, history of Roux-en-Y bypass surgery, migraine, history of seizure in 2015 presenting to the ED after she was found unresponsive in her car in a parking lot and a bystander called EMS.  On EMS arrival, patient was awake but confused/disoriented and repeating words. Vital signs stable on arrival to the ED but orthostatics checked later positive with SBP dropping from 110 to 88 with standing.  Labs showing a leukocytosis or anemia, not hypoglycemic, BUN 23, creatinine 1.95 (baseline 0.8), UA not suggestive of infection.  EKG showing sinus rhythm and no acute ischemic changes.  Patient was given Tylenol , Percocet, and 2 L IV fluid boluses.  [facility] called to admit.  Patient states she was feeling dizzy at home going from sitting to standing position.  She then drove to a store to get food and while sitting in her car which was parked she lost consciousness.  She takes lisinopril  for high blood pressure.  Also takes Xanax  for anxiety and gabapentin  and oxycodone  for chronic neck/back pain after an injury several years ago.  She reports a brief episode of sharp left upper quadrant abdominal pain 3 days ago after she ate some candy but no recurrence of abdominal pain since then.  Denies nausea, vomiting, diarrhea, or constipation.  She is having regular bowel movements.  Denies fevers, cough, shortness of breath, or chest pain.  Denies illicit drug use.  Review of Systems:  Review of Systems  All other systems reviewed and are negative.   Past Medical History:  Diagnosis Date   Acute renal failure 04/26/2021   prerenal azotemia   Allergic rhinitis     Altered sensorium due to hypoglycemia 03/2016   Anxiety and depression    started with PPD   Chronic neck pain    Myofascial pain syndrome per [facility], oxycodone  per their office pain med contract.  (Normal cervical MRI 2010 per ortho notes).   Easy bruisability 2011   Hx of: saw Hematologist (Dr. Freddie) and w/u neg for von Willebrand desease.   GERD (gastroesophageal reflux disease)    hx gastric ulcer and upper GI bleed, ? 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 Pt to start FeSO4 325 qd 10/28/19   Irregular menses 03/2018   GYN started [facility]  0.25-0.35.  [facility] as of 10/2018.  u/s wnl.  Endom bx NEG. OCPs helped.   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 Kassie   Seizure [facility]) 2015   s/p eval with neuro, no medication, no events since   Syncope    with ? seizure in 2015, eval with neruo   Vitamin B12 deficiency    Vitamin D  deficiency     Past Surgical History:  Procedure Laterality Date  CESAREAN SECTION  2006   COLONOSCOPY W/ POLYPECTOMY     adenomas 11/17/23-->recall 7 yrs   ELBOW X-RAY Left 12/07/2019   ENDOMETRIAL BIOPSY     EYE SURGERY Bilateral    2001   FINGER X-RAY Right 12/07/2019   ROUX-EN-Y GASTRIC BYPASS  01/05/2002   TONSILLECTOMY AND ADENOIDECTOMY     TUBAL LIGATION  2006   tubes in ears both Bilateral      reports that she has never smoked. She has never used smokeless tobacco. She reports current alcohol  use. She reports that she does not use drugs.  Allergies[1]  Family History  Problem Relation Age of Onset   Arthritis Mother     Hyperlipidemia Mother    Heart disease Mother    Diabetes Mother    Asthma Mother    Aneurysm Father        deceased   Lung cancer Maternal Aunt        smoker   Lung cancer Maternal Aunt        smoker   Colon cancer Maternal Grandmother    Esophageal cancer Neg Hx    Rectal cancer Neg Hx    Stomach cancer Neg Hx     Prior to Admission medications  Medication Sig Start Date End Date Taking? Authorizing Provider  ALPRAZolam  (XANAX ) 1 MG tablet Take 2 tablets (2 mg total) by mouth 2 (two) times daily. 06/09/24   McGowen, Aleene DEL, MD  amoxicillin -clavulanate (AUGMENTIN ) 500-125 MG tablet Take 1 tablet by mouth 3 (three) times daily. 09/10/24   Sherlynn Madden, MD  benzonatate  (TESSALON ) 200 MG capsule Take 1 capsule (200 mg total) by mouth 2 (two) times daily as needed for cough. 09/10/24   Sherlynn Madden, MD  Calcium  250 MG CAPS Take 1 capsule by mouth 3 (three) times daily.    [provider]  cyanocobalamin  (VITAMIN B12) 1000 MCG/ML injection Inject 1 mL (1,000 mcg total) into the muscle every 2 weeks 06/09/24   McGowen, Aleene DEL, MD  ESTARYLLA  0.25-35 MG-MCG tablet Take 1 tablet by mouth daily. 01/16/23   Armond Cape, MD  famotidine  (PEPCID ) 20 MG tablet Take 1 tablet (20 mg total) by mouth 2 (two) times daily. 06/09/24   McGowen, Aleene DEL, MD  Ferrous Fumarate  (HEMOCYTE - 106 MG FE) 324 (106 Fe) MG TABS tablet Take 1 tablet (106 mg of iron  total) by mouth daily. 06/09/24   McGowen, Aleene DEL, MD  FLUoxetine  (PROZAC ) 40 MG capsule Take 1 capsule (40 mg total) by mouth daily. 08/30/24   Arfeen, Leni DASEN, MD  gabapentin  (NEURONTIN ) 400 MG capsule Take 1 capsule (400 mg total) by mouth 2 (two) times daily. Patient taking differently: Take 300 mg by mouth 2 (two) times daily. 08/30/24   Arfeen, Leni T, MD  glucose blood (FREESTYLE LITE) test strip USE TO CHECK BLOOD SUGAR 3 TIMES A DAY 06/06/20   McGowen, Aleene DEL, MD  lamoTRIgine  (LAMICTAL ) 150 MG tablet Take 1 tablet (150 mg  total) by mouth daily. 08/30/24   Arfeen, Leni DASEN, MD  levocetirizine (XYZAL ) 5 MG tablet Take 1 tablet (5 mg total) by mouth every evening. 08/26/24   McGowen, Aleene DEL, MD  lidocaine  (XYLOCAINE ) 2 % solution Use as directed 15 mLs in the mouth or throat as needed for mouth pain. 07/05/24   Moishe Chiquita HERO, NP  lisinopril  (ZESTRIL ) 20 MG tablet Take 1 tablet (20 mg total) by mouth daily. 06/09/24   McGowen, Aleene DEL, MD  metroNIDAZOLE  (FLAGYL ) 500  MG tablet Take 1 tablet (500 mg total) by mouth 2 (two) times daily for 7 days. Patient not taking: Reported on 09/10/2024 03/05/24     naloxone  (NARCAN ) nasal spray 4 mg/0.1 mL Place 1 spray into the nose once. Patient not taking: Reported on 09/10/2024 02/14/23   [provider]  omeprazole  (PRILOSEC) 40 MG capsule Take 1 capsule (40 mg total) by mouth 2 (two) times daily. 06/09/24   McGowen, Aleene DEL, MD  ondansetron  (ZOFRAN -ODT) 4 MG disintegrating tablet Take 1 tablet (4 mg total) by mouth every 8 (eight) hours as needed. 06/09/24   McGowen, Aleene DEL, MD  oxyCODONE -acetaminophen  (PERCOCET) 10-325 MG tablet Take 1 tablet by mouth every 6 (six) hours as needed for pain. 05/31/19   [provider]  Ubrogepant  (UBRELVY ) 50 MG TABS 1-2 tabs po once a day as needed for migraine headache 06/09/24   McGowen, Aleene DEL, MD  Vitamin D , Ergocalciferol , (DRISDOL ) 1.25 MG (50000 UNIT) CAPS capsule Take 1 capsule (50,000 Units total) by mouth 2 (two) times a week. 02/05/24   Candise Aleene DEL, MD    Physical Exam: Vitals:   10/25/24 0200 10/25/24 0215 10/25/24 0345 10/25/24 0418  BP: (!) 90/51 (!) 87/55  (!) 110/59  Pulse:  80 81 81  Resp:  14 (!) 21 19  Temp: 98 F (36.7 C)     TempSrc: Oral     SpO2:  95% 100% 99%  Weight:      Height:        Physical Exam Vitals reviewed.  Constitutional:      General: She is not in acute distress. HENT:     Head: Normocephalic and atraumatic.  Eyes:     Extraocular Movements: Extraocular movements intact.   Cardiovascular:     Rate and Rhythm: Normal rate and regular rhythm.  Pulmonary:     Effort: Pulmonary effort is normal. No respiratory distress.     Breath sounds: Normal breath sounds.  Abdominal:     General: Bowel sounds are normal. There is no distension.     Palpations: Abdomen is soft.     Tenderness: There is no abdominal tenderness. There is no guarding.  Musculoskeletal:     Cervical back: Normal range of motion.     Right lower leg: No edema.     Left lower leg: No edema.  Skin:    General: Skin is warm and dry.  Neurological:     General: No focal deficit present.     Mental Status: She is alert and oriented to person, place, and time.     Cranial Nerves: No cranial nerve deficit.     Sensory: No sensory deficit.     Motor: No weakness.     Labs on Admission: I have personally reviewed following labs and imaging studies  CBC: Recent Labs  Lab 10/25/24 0241  WBC 7.7  NEUTROABS 4.1  HGB 12.5  HCT 39.1  MCV 97.8  PLT 346   Basic Metabolic Panel: Recent Labs  Lab 10/25/24 0241  NA 139  K 4.0  CL 101  CO2 25  GLUCOSE 88  BUN 23*  CREATININE 1.95*  CALCIUM  8.8*   GFR: Estimated Creatinine Clearance: 43.6 mL/min (A) (by C-G formula based on SCr of 1.95 mg/dL (H)). Liver Function Tests: No results for input(s): AST, ALT, ALKPHOS, BILITOT, PROT, ALBUMIN in the last 168 hours. No results for input(s): LIPASE, AMYLASE in the last 168 hours. No results for input(s): AMMONIA in the last  168 hours. Coagulation Profile: No results for input(s): INR, PROTIME in the last 168 hours. Cardiac Enzymes: No results for input(s): CKTOTAL, CKMB, CKMBINDEX, TROPONINI in the last 168 hours. BNP (last 3 results) No results for input(s): PROBNP in the last 8760 hours. HbA1C: No results for input(s): HGBA1C in the last 72 hours. CBG: Recent Labs  Lab 10/24/24 2132 10/25/24 0155  GLUCAP 92 80   Lipid Profile: No results for  input(s): CHOL, HDL, LDLCALC, TRIG, CHOLHDL, LDLDIRECT in the last 72 hours. Thyroid  Function Tests: No results for input(s): TSH, T4TOTAL, FREET4, T3FREE, THYROIDAB in the last 72 hours. Anemia Panel: No results for input(s): VITAMINB12, FOLATE, FERRITIN, TIBC, IRON , RETICCTPCT in the last 72 hours. Urine analysis:    Component Value Date/Time   COLORURINE YELLOW 10/25/2024 0410   APPEARANCEUR HAZY (A) 10/25/2024 0410   LABSPEC 1.031 (H) 10/25/2024 0410   PHURINE 5.0 10/25/2024 0410   GLUCOSEU NEGATIVE 10/25/2024 0410   GLUCOSEU NEGATIVE 01/04/2021 1548   HGBUR NEGATIVE 10/25/2024 0410   BILIRUBINUR NEGATIVE 10/25/2024 0410   BILIRUBINUR Negative 01/04/2021 1541   KETONESUR NEGATIVE 10/25/2024 0410   PROTEINUR 30 (A) 10/25/2024 0410   UROBILINOGEN 0.2 01/04/2021 1548   UROBILINOGEN 0.2 01/04/2021 1541   NITRITE NEGATIVE 10/25/2024 0410   LEUKOCYTESUR NEGATIVE 10/25/2024 0410    Radiological Exams on Admission: No results found.  Assessment and Plan  Syncope Syncope likely due to orthostatic hypotension.  Polypharmacy could also be playing a role as she takes multiple sedating medications at home including Xanax , gabapentin , and oxycodone .  Discussed reducing dose of some of her home medications but patient is very hesitant and she thinks the dose of her medications needs to be increased to help with her chronic pain and anxiety.  EKG showing sinus rhythm and no acute ischemic changes.  Patient is not endorsing chest pain or dyspnea.  No tachycardia or hypoxia to suggest PE.  Not hypoglycemic.  Do not think brain imaging is indicated at this time given no focal neurodeficit on exam and no head injury reported.  No seizure-like activity reported.  Continue cardiac monitoring, echocardiogram ordered, and continue management of orthostatic hypotension as mentioned below.  Orthostatic hypotension SBP dropped from 110 to 88 with standing.  Hold home  antihypertensive.  Continue IV fluid hydration and repeat orthostatics in the morning.  Fall precautions.  AKI Likely due to dehydration.  Creatinine 1.95 (baseline 0.8).  Continue IV fluid hydration and monitor renal function.  Avoid nephrotoxic agents/hold lisinopril .  Mood disorder Continue home Xanax , Prozac , and Lamictal .  GERD/PUD Continue PPI, H2 blocker PRN.  Chronic pain syndrome Continue gabapentin  and Percocet.  DVT prophylaxis: Lovenox  Code Status: Full Code (discussed with the patient) Level of care: Telemetry bed Admission status: It is my clinical opinion that referral for OBSERVATION is reasonable and necessary in this patient based on the above information provided. The aforementioned taken together are felt to place the patient at high risk for further clinical deterioration. However, it is anticipated that the patient may be medically stable for discharge from the hospital within 24 to 48 hours.  Editha Ram MD [facility]  If 7PM-7AM, please contact night-coverage [email or web address]  10/25/2024, 5:21 AM       [1]  Allergies Allergen Reactions   Ibuprofen Nausea Only    Causes ulcers   "

## 2024-10-26 ENCOUNTER — Observation Stay (HOSPITAL_COMMUNITY)

## 2024-10-26 ENCOUNTER — Encounter (HOSPITAL_COMMUNITY): Payer: Self-pay

## 2024-10-26 ENCOUNTER — Encounter: Payer: Self-pay | Admitting: Family Medicine

## 2024-10-26 DIAGNOSIS — R404 Transient alteration of awareness: Secondary | ICD-10-CM

## 2024-10-26 DIAGNOSIS — R569 Unspecified convulsions: Secondary | ICD-10-CM | POA: Diagnosis not present

## 2024-10-26 DIAGNOSIS — Z8669 Personal history of other diseases of the nervous system and sense organs: Secondary | ICD-10-CM

## 2024-10-26 LAB — BASIC METABOLIC PANEL WITH GFR
Anion gap: 9 (ref 5–15)
BUN: 23 mg/dL — ABNORMAL HIGH (ref 6–20)
CO2: 19 mmol/L — ABNORMAL LOW (ref 22–32)
Calcium: 8.7 mg/dL — ABNORMAL LOW (ref 8.9–10.3)
Chloride: 107 mmol/L (ref 98–111)
Creatinine, Ser: 1 mg/dL (ref 0.44–1.00)
GFR, Estimated: >60 mL/min
Glucose, Bld: 78 mg/dL (ref 70–99)
Potassium: 5.2 mmol/L — ABNORMAL HIGH (ref 3.5–5.1)
Sodium: 135 mmol/L (ref 135–145)

## 2024-10-26 LAB — GLUCOSE, CAPILLARY: Glucose-Capillary: 112 mg/dL — ABNORMAL HIGH (ref 70–99)

## 2024-10-26 MED ORDER — SODIUM ZIRCONIUM CYCLOSILICATE 5 G PO PACK
5.0000 g | PACK | Freq: Once | ORAL | Status: AC
  Administered 2024-10-26: 5 g via ORAL
  Filled 2024-10-26: qty 1

## 2024-10-26 NOTE — Progress Notes (Signed)
 2318- This nurse pressed EEG monitoring button due to patient becoming even more incomprehensible and having worsened slurred speech and mumbling that sounds like she is speaking another language. Patient is also experiencing word salad and during conversation with patient patient is not making any sense in what she is talking about and falls asleep mid-sentence. Once patient is awakened to seek clarification on what she was referring to prior to falling asleep patient does not know what she was talking about and starts a new conversation on a new topic that is also incomprehensible still and is not linear are makes sense.   [id number] Barefoot EEG tech with [employer] called this nurse to inquire about the reasoning for this nurse pressing the EEG button.This nurse explained to him why.  2330- This nurse paged Michaela MD Neurologist covering hospital tonight to let provider know about patient's symptoms and that this nurse had to press the EEG monitoring button. Awaiting providers response.  [id number] Michaela MD responded to this nurses page and didn't allow this nurse to explain the symptoms the patient was experiencing before interrupting this nurse by saying I will review the EEG stirp and hanging up on this nurse.  2334- This nurse paged Franky MD, Hospitalist covering the hospital for tonight, to also make the provider aware of the symptoms that the patient is now experiencing and that this nurse had to press the EEG monitoring device button. Waiting on providers response.  2338- This nurse pressed EEG button due to patient having worsening memory loss and asking for things that she thought that she had near her or in her room prior that were not in her room or near her.  2339- Hospitalist Franky responded to this nurses page and want this nurse to obtain a CBG on patient. CBG on patient was 112. Provider made aware and inquired about what Neuro stated or  suggested. This nurse had to end the phone call with provider though due to this nurse receiving another phone call from someone about this patient. Provider stated that he will call back.  [id number] Barefoot EEG tech with [facility] called this nurse to inform nurse that he had sent a text message to the Epileptologist to review patient's tracing's.  [id number] Michaela Neurologist provider at bedside to assess patient, Deztiny Cox Charge nurse was in the room with provider and stated to this nurse that she was A&O x3. That was reported to me after I was headed to patient's room to be in room with provider to be in there when he assessed patient, but unfortunately this nurse was unable to be in the room with him but caught provider as he was leaving the room and asked provider what he thought and provider stated she is doing better and this nurse again tried to report symptoms to provider since she was declining in her mental status quickly; but the provider kept walking and said over his shoulder we are not going to do anything different for her and left the unit.  2354- Hospitalist Franky called this nurse back to inform this nurse that he was going to come and assess patient in person.

## 2024-10-26 NOTE — Plan of Care (Signed)
" °  Problem: Education: Goal: Knowledge of General Education information will improve Description: Including pain rating scale, medication(s)/side effects and non-pharmacologic comfort measures 10/26/2024 0221 by Geanie Annabella DASEN, RN Outcome: Not Progressing 10/26/2024 0220 by Geanie Annabella DASEN, RN Outcome: Not Progressing   Problem: Health Behavior/Discharge Planning: Goal: Ability to manage health-related needs will improve 10/26/2024 0221 by Geanie Annabella DASEN, RN Outcome: Not Progressing 10/26/2024 0220 by Geanie Annabella DASEN, RN Outcome: Not Progressing   Problem: Clinical Measurements: Goal: Ability to maintain clinical measurements within normal limits will improve 10/26/2024 0221 by Geanie Annabella DASEN, RN Outcome: Not Progressing 10/26/2024 0220 by Geanie Annabella DASEN, RN Outcome: Not Progressing Goal: Will remain free from infection 10/26/2024 0221 by Geanie Annabella DASEN, RN Outcome: Not Progressing 10/26/2024 0220 by Geanie Annabella DASEN, RN Outcome: Not Progressing Goal: Diagnostic test results will improve 10/26/2024 0221 by Geanie Annabella DASEN, RN Outcome: Not Progressing 10/26/2024 0220 by Geanie Annabella DASEN, RN Outcome: Not Progressing Goal: Respiratory complications will improve 10/26/2024 0221 by Geanie Annabella DASEN, RN Outcome: Not Progressing 10/26/2024 0220 by Geanie Annabella DASEN, RN Outcome: Not Progressing Goal: Cardiovascular complication will be avoided 10/26/2024 0221 by Geanie Annabella DASEN, RN Outcome: Not Progressing 10/26/2024 0220 by Geanie Annabella DASEN, RN Outcome: Not Progressing   Problem: Activity: Goal: Risk for activity intolerance will decrease 10/26/2024 0221 by Geanie Annabella DASEN, RN Outcome: Not Progressing 10/26/2024 0220 by Geanie Annabella DASEN, RN Outcome: Not Progressing   Problem: Nutrition: Goal: Adequate nutrition will be maintained 10/26/2024 0221 by Geanie Annabella DASEN, RN Outcome: Not Progressing 10/26/2024 0220 by Geanie Annabella DASEN, RN Outcome: Not  Progressing   Problem: Coping: Goal: Level of anxiety will decrease 10/26/2024 0221 by Geanie Annabella DASEN, RN Outcome: Not Progressing 10/26/2024 0220 by Geanie Annabella DASEN, RN Outcome: Not Progressing   Problem: Elimination: Goal: Will not experience complications related to bowel motility 10/26/2024 0221 by Geanie Annabella DASEN, RN Outcome: Not Progressing 10/26/2024 0220 by Geanie Annabella DASEN, RN Outcome: Not Progressing Goal: Will not experience complications related to urinary retention 10/26/2024 0221 by Geanie Annabella DASEN, RN Outcome: Not Progressing 10/26/2024 0220 by Geanie Annabella DASEN, RN Outcome: Not Progressing   Problem: Pain Managment: Goal: General experience of comfort will improve and/or be controlled 10/26/2024 0221 by Geanie Annabella DASEN, RN Outcome: Not Progressing 10/26/2024 0220 by Geanie Annabella DASEN, RN Outcome: Not Progressing   Problem: Safety: Goal: Ability to remain free from injury will improve 10/26/2024 0221 by Geanie Annabella DASEN, RN Outcome: Not Progressing 10/26/2024 0220 by Geanie Annabella DASEN, RN Outcome: Not Progressing   Problem: Skin Integrity: Goal: Risk for impaired skin integrity will decrease 10/26/2024 0221 by Geanie Annabella DASEN, RN Outcome: Not Progressing 10/26/2024 0220 by Geanie Annabella DASEN, RN Outcome: Not Progressing   "

## 2024-10-26 NOTE — Progress Notes (Addendum)
 NEUROLOGY CONSULT FOLLOW UP NOTE   Date of service: October 26, 2024 Patient Name: Toni [name] MRN:  [id number] DOB:  1976/07/30  Interval Hx/subjective  Seen and examined.  No acute issues reported by the patient. She had event button's pressed 3 times yesterday when she had difficulty talking, lethargic, mumbling, reported brain fog-no concomitant EEG change.  Events were nonepileptic.  Vitals   Vitals:   10/25/24 1948 10/25/24 2332 10/26/24 0337 10/26/24 0754  BP: 121/64 112/68 129/65 107/68  Pulse: 68 71 67 91  Resp: 18 18 18 18   Temp: 98.4 F (36.9 C) 98.2 F (36.8 C) 97.9 F (36.6 C) 97.9 F (36.6 C)  TempSrc: Oral Oral Oral Oral  SpO2:   96% 100%  Weight:      Height:         Body mass index is 38.74 kg/m.  Physical Exam   Awake alert oriented x 3 No dysarthria No aphasia Cranial nerves II to XII intact No strength deficits No sensory deficits No coordination deficits  Well-developed well-nourished Regular rate rhythm Chest clear to auscultation   Medications Current Medications[1]  Labs and Diagnostic Imaging   CBC:  Recent Labs  Lab 10/25/24 0241  WBC 7.7  NEUTROABS 4.1  HGB 12.5  HCT 39.1  MCV 97.8  PLT 346    Basic Metabolic Panel:  Lab Results  Component Value Date   NA 135 10/26/2024   K 5.2 (H) 10/26/2024   CO2 19 (L) 10/26/2024   GLUCOSE 78 10/26/2024   BUN 23 (H) 10/26/2024   CREATININE 1.00 10/26/2024   CALCIUM  8.7 (L) 10/26/2024   GFRNONAA >60 10/26/2024   GFRAA 41 (L) 05/15/2020   Lipid Panel:  Lab Results  Component Value Date   LDLCALC 149 (H) 06/04/2024   HgbA1c:  Lab Results  Component Value Date   HGBA1C 5.0 06/04/2024   Urine Drug Screen:     Component Value Date/Time   LABOPIA NONE DETECTED 03/21/2016 1350   COCAINSCRNUR NONE DETECTED 03/21/2016 1350   LABBENZ POSITIVE (A) 03/21/2016 1350   AMPHETMU NONE DETECTED 03/21/2016 1350   THCU NONE DETECTED 03/21/2016 1350   LABBARB NONE DETECTED  03/21/2016 1350    Alcohol  Level No results found for: [facility] INR  Lab Results  Component Value Date   INR 1.11 03/21/2016   APTT  Lab Results  Component Value Date   APTT 30 03/21/2016   LTM EEG: Description: The posterior dominant rhythm consists of 9 Hz activity of moderate voltage (25-35 uV) seen predominantly in posterior head regions, symmetric and reactive to eye opening and eye closing. Sleep was characterized by vertex waves, sleep spindles (12 to 14 Hz), maximal frontocentral region.     Event button was pressed on 10/25/2024 at 1626  Per RN, patient had trouble answering some questions. Concomitant EEG before, during and after the event showed normal posterior dominant rhythm, did not show any EEG changes to suggest seizure.   Event button was pressed on 10/25/2024 at 2318.  Patient was lethargic and mumbling, reported brain fog. Concomitant EEG before, during and after the event showed normal posterior dominant rhythm, did not show any EEG changes to suggest seizure.   Event button was pressed on 10/25/2024 at 2338. Patient was confused, asking for things that were not there. Concomitant EEG before, during and after the event showed normal posterior dominant rhythm, did not show any EEG changes to suggest seizure.   Hyperventilation and photic stimulation were not performed.  IMPRESSION: This study is within normal limits. No seizures or epileptiform discharges were seen throughout the recording.   Three events were recorded as described above without concomitant change. These events were most likely nonepileptic  Assessment   Toni [name] is a 49 y.o. female past personally reported history of seizures and multiple prior episodes of transient loss of consciousness now being found unresponsive in her parked car.  Hooked up to LTM due to unclear etiology of these events.  Reported by RN-3 episodes of different types that were captured with no concomitant EEG change. Events  are likely nonepileptic.  Also concern for polypharmacy with most of the episodes in the past Discussed EMU admission as an outpatient which she is amenable to No need for AEDs  Impression: Transient loss of awareness/consciousness-etiology includes polypharmacy versus psychogenic etiology LTM EEG negative for epileptic phenomenology  Recommendations  At this time, will not recommend antiepileptic medication She is amenable for EMU admission-will send a referral Seizure precautions since there was an unwitnessed and unexplained loss of consciousness.  This has been discussed with the patient's and she verbalized understanding Minimize sedating medication use She would like to follow-up with [place] neurology-has followed with [facility] neurology in the past and would not like to return to their practice. Plan was discussed with Dr. Shelton, epileptologist and Dr. Drusilla, hospitalist ______________________________________________________________________   Signed, Eligio Lav, MD [facility]   SEIZURE PRECAUTIONS Per [place]  DMV statutes, patients with seizures are not allowed to drive until they have been seizure-free for six months.   Use caution when using heavy equipment or power tools. Avoid working on ladders or at heights. Take showers instead of baths. Ensure the water temperature is not too high on the home water heater. Do not go swimming alone. Do not lock yourself in a room alone (i.e. bathroom). When caring for infants or small children, sit down when holding, feeding, or changing them to minimize risk of injury to the child in the event you have a seizure. Maintain good sleep hygiene. Avoid alcohol .    If patient has another seizure, call 911 and bring them back to the ED if: A.  The seizure lasts longer than 5 minutes.      B.  The patient doesn't wake shortly after the seizure or has new problems such as difficulty seeing, speaking or moving following the  seizure C.  The patient was injured during the seizure D.  The patient has a temperature over 102 F (39C) E.  The patient vomited during the seizure and now is having trouble breathing     [1]  Current Facility-Administered Medications:    ALPRAZolam  (XANAX ) tablet 2 mg, 2 mg, Oral, BID, Rathore, Vasundhra, MD, 2 mg at 10/26/24 9194   enoxaparin  (LOVENOX ) injection 50 mg, 50 mg, Subcutaneous, Q24H, Rathore, Vasundhra, MD, 50 mg at 10/25/24 1348   famotidine  (PEPCID ) tablet 20 mg, 20 mg, Oral, Daily PRN, Rathore, Vasundhra, MD   FLUoxetine  (PROZAC ) capsule 40 mg, 40 mg, Oral, QHS, Drusilla, Gagan S, MD, 40 mg at 10/25/24 1737   gabapentin  (NEURONTIN ) capsule 400 mg, 400 mg, Oral, BID, Rathore, Vasundhra, MD, 400 mg at 10/26/24 0805   lamoTRIgine  (LAMICTAL ) tablet 150 mg, 150 mg, Oral, QHS, Drusilla, Gagan S, MD, 150 mg at 10/25/24 1737   loratadine  (CLARITIN ) tablet 10 mg, 10 mg, Oral, QPM, Rathore, Vasundhra, MD, 10 mg at 10/25/24 1737   naloxone  (NARCAN ) injection 0.4 mg, 0.4 mg, Intravenous, PRN, Rathore, Vasundhra, MD   norgestimate -ethinyl  estradiol  (ORTHO-CYCLEN) 0.25-35 MG-MCG tablet 1 tablet *Patient Supplied - at bedside*, 1 tablet, Oral, Daily, Rathore, Vasundhra, MD, 1 tablet at 10/26/24 0806   oxyCODONE -acetaminophen  (PERCOCET/ROXICET) 5-325 MG per tablet 1 tablet, 1 tablet, Oral, Q4H PRN, 1 tablet at 10/26/24 0848 **AND** oxyCODONE  (Oxy IR/ROXICODONE ) immediate release tablet 5 mg, 5 mg, Oral, Q4H PRN, Laron Agent, RPH, 5 mg at 10/26/24 0848   pantoprazole  (PROTONIX ) EC tablet 40 mg, 40 mg, Oral, BID, Rathore, Vasundhra, MD, 40 mg at 10/26/24 0805   sodium zirconium cyclosilicate  (LOKELMA ) packet 5 g, 5 g, Oral, Once, Drusilla Sabas RAMAN, MD

## 2024-10-26 NOTE — Progress Notes (Addendum)
 While this nurse was assisting patient back to bed from the bathroom patient stated to this nurse during covid I started to feel sort of down so I went to see a therapist and I had told her that I am not a 1 glass of wine kind of person. When I drink wine I drink the whole bottle of wine, and when I told her that she tried to admit me to an inpatient rehab. 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 So I started going to a psychiatrist and was talking to him about it and he said I was not an alcoholic it was because I am OCD and he started talking to me about the different medications he could put me on to treat it. This nurse inquired about how often she drinks a whole bottle of wine and originally patient stated  occasionally, maybe every month then after a brief pause this nurse asked patient inquired again and patient stated I have a bottle probably three days out of the week. Patient assisted to a comfortable position in bed and bed alarm turned back on.

## 2024-10-26 NOTE — Progress Notes (Signed)
 LTM EEG discontinued - no skin breakdown at [facility].

## 2024-10-26 NOTE — Discharge Instructions (Signed)
SEIZURE PRECAUTIONS °Per [place] DMV statutes, patients with seizures are not allowed to drive until they have been seizure-free for six months. °  °Use caution when using heavy equipment or power tools. Avoid working on ladders or at heights. Take showers instead of baths. Ensure the water temperature is not too high on the home water heater. Do not go swimming alone. Do not lock yourself in a room alone (i.e. bathroom). When caring for infants or small children, sit down when holding, feeding, or changing them to minimize risk of injury to the child in the event you have a seizure. Maintain good sleep hygiene. Avoid alcohol. °  °If patient has another seizure, call 911 and bring them back to the ED if: °A.  The seizure lasts longer than 5 minutes.      °B.  The patient doesn't wake shortly after the seizure or has new problems such as difficulty seeing, speaking or moving following the seizure °C.  The patient was injured during the seizure °D.  The patient has a temperature over 102 F (39C) °E.  The patient vomited during the seizure and now is having trouble breathing ° ° °

## 2024-10-26 NOTE — Progress Notes (Signed)
 LTM maint complete - no skin breakdown under: FP2,F7. Patient transferred this morning from 1S14 to 3W15. Files transferred and verified  that [facility] is monitoring

## 2024-10-26 NOTE — Progress Notes (Signed)
 28- Hospitalist Franky MD at bedside to assess patient. After assessing patient he asked this nurse so this unit specializes in OBGYN care only this nurse responded by saying we also take care of gynecological patients as well. Then the provider asked this nurse how the patient ended up on this unit because she should not have been placed on this unit and she needs to be transferred to another unit. He then followed up to say that this nurse needed to get in contact with my [facility] and get the patient transferred to another monitored unit.This nurse explained to the provider that we can not transfer patients without a doctors order and that my AC would not be able to facilitate a transfer without a providers order. Provider stated that he would talk to someone on his side and Neuro and get back with this nurse about transferring patient.   0031- This nurse called [facility] to inform her of everything that was going on.  In the process of all this occurring patient's mental status was declining and was episodic and was changes that were once noted after talking to her for at least 5 minutes or longer while trying to have a conversation with her; but had now became more apparent in a shorter time spam. Patient had to be redirected more frequently and reminded more often to not try and take the electrodes out of her for the EEG machine frequently. She would ask questions over again more often in a shorter time spam. Her communication was almost all mumbling at this point, but it was all episodic and did not warrant calling a rapid response on patient.  0210- This nurse had to call Lesle the hospitalist to touch base with him to see if he still wanted patient to be transferred to a different unit and he did. This nurse had to explain to him again that he had to put in the transfer order.Transfer order obtained.  9786- Deztiny Cox told this nurse that patient would be going to room  6N14.  0228- This nurse called unit and attempted to give report to the nurse that was supposed to be getting patient, when this nurse stated that patient was on EEG monitoring transfer unit nurse stated that he does not think patient is appropriate for his unit and seems more like a Neuro patient. This nurse stated that if he needed to talk to his charge nurse to find out he could and just call this nurse back. That nurse was agreeable to this plan.  [id number] Raisin Cox charge nurse told this nurse that [facility] was on the phone during rounds with another [facility] that denied the transfer of the patient to 6N because the patient was not appropriate for that unit.  9656- This nurse called [facility] to see what the plan was now and was informed that the patient will be going to either a progressive care unit or to the ICU. We are just waiting on a bed.   This nurse has been monitoring patient throughout all of this and patient is currently asleep.

## 2024-10-26 NOTE — Procedures (Addendum)
 Patient Name: Toni [name]  MRN: [id number]  Epilepsy Attending: Arlin MALVA Krebs  Referring Physician/Provider: Voncile Isles, MD  Duration: 10/25/2024 1145 to 1/20/226 0917  Patient history: 49 y.o. female with remote seizure history and multiple prior episodes of transient loss of consciousness, now presenting after being found unresponsive in her parked car. EEG to evaluate for seizure  Level of alertness: Awake, asleep  AEDs during EEG study: Xanax , GBP, LTG  Technical aspects: This EEG study was done with scalp electrodes positioned according to the 10-20 International system of electrode placement. Electrical activity was reviewed with band pass filter of 1-70Hz , sensitivity of 7 uV/mm, display speed of 74mm/sec with a 60Hz  notched filter applied as appropriate. EEG data were recorded continuously and digitally stored.  Video monitoring was available and reviewed as appropriate.  Description: The posterior dominant rhythm consists of 9 Hz activity of moderate voltage (25-35 uV) seen predominantly in posterior head regions, symmetric and reactive to eye opening and eye closing. Sleep was characterized by vertex waves, sleep spindles (12 to 14 Hz), maximal frontocentral region.    Event button was pressed on 10/25/2024 at 1626  Per RN, patient had trouble answering some questions. Concomitant EEG before, during and after the event showed normal posterior dominant rhythm, did not show any EEG changes to suggest seizure.  Event button was pressed on 10/25/2024 at 2318.  Patient was lethargic and mumbling, reported brain fog. Concomitant EEG before, during and after the event showed normal posterior dominant rhythm, did not show any EEG changes to suggest seizure.  Event button was pressed on 10/25/2024 at 2338. Patient was confused, asking for things that were not there. Concomitant EEG before, during and after the event showed normal posterior dominant rhythm, did not show any EEG changes to  suggest seizure.  Hyperventilation and photic stimulation were not performed.     IMPRESSION: This study is within normal limits. No seizures or epileptiform discharges were seen throughout the recording.  Three events were recorded as described above without concomitant change. These events were most likely nonepileptic.  Will Heinkel O [place]

## 2024-10-26 NOTE — Progress Notes (Signed)
 I responded to the patient's bedside, because I was called reporting that she was sleepy and hard to arouse, acting confused.  This was noted at 1118, I was able to assess the patient shortly thereafter and by the time of my evaluation around 1140, the patient had returned to baseline and had an NIH stroke scale of zero.  The patient does not remember having trouble speaking, which would make cerebrovascular etiology less likely.  We will continue to monitor and continue to follow.  Aisha Seals, MD [facility]   If 7pm- 7am, please page neurology on call as listed in AMION.

## 2024-10-26 NOTE — Progress Notes (Signed)
 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 Discussed with the patient and all questions fully answered.  Skin clean, dry, and intact without evidence of skin break down. IV catheter discontinued intact. Site without signs and symptoms of complications. Dressing and pressure applied.  An After Visit Summary was printed and given to the patient.  Patient escorted via [facility], and discharged home via private auto.  Toni [name]  10/26/2024

## 2024-10-26 NOTE — TOC Transition Note (Signed)
 Transition of Care [facility]) - Discharge Note   Patient Details  Name: Toni [name] MRN: [id number] Date of Birth: Dec 05, 1975  [facility]) CM/SW Contact:  Andrez JULIANNA George, RN Phone Number: 10/26/2024, 10:52 AM   Clinical Narrative:     Pt is discharging home with self care. No needs per ICM. Pt has transportation home.  Final next level of care: Home/Self Care Barriers to Discharge: No Barriers Identified   Patient Goals and CMS Choice            Discharge Placement                       Discharge Plan and Services Additional resources added to the After Visit Summary for                                       Social Drivers of Health (SDOH) Interventions SDOH Screenings   Food Insecurity: Food Insecurity Present (10/25/2024)  Housing: High Risk (10/25/2024)  Transportation Needs: No Transportation Needs (10/25/2024)  Utilities: At Risk (10/25/2024)  Depression (PHQ2-9): Low Risk (09/10/2024)  Financial Resource Strain: High Risk (12/07/2021)  Physical Activity: Insufficiently Active (12/07/2021)  Social Connections: Socially Isolated (12/07/2021)  Stress: Stress Concern Present (12/07/2021)  Tobacco Use: Low Risk (10/24/2024)     Readmission Risk Interventions     No data to display
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
## 2024-10-26 NOTE — Progress Notes (Signed)
 Upon this nurses initial assessment it was noted that patient has left sided deficit in strength in upper extremity. Patient is A&O x 4, patient is experiencing impulsiveness, poor attention and concentration, poor judgement, poor safety awareness, is unable to follow commands, and is displaying slight memory impairment and forgetfulness. Patient's speech is also slightly slurred and incomprehensible at times, but if you ask patient to repeat herself her speech becomes somewhat clearer; patient does however have delayed responses quite often. Patient states that she is very drowsy and drained. Will continue to monitor patient closely at this time this nurse has placed non-slip grip socks on patient and has turned patient's bed alarm on.

## 2024-10-30 ENCOUNTER — Other Ambulatory Visit: Payer: Self-pay

## 2024-10-30 ENCOUNTER — Encounter (HOSPITAL_COMMUNITY): Payer: Self-pay

## 2024-10-30 ENCOUNTER — Emergency Department (HOSPITAL_COMMUNITY)
Admission: EM | Admit: 2024-10-30 | Discharge: 2024-10-30 | Disposition: A | Discharge status: Home / Self Care | Attending: Emergency Medicine | Admitting: Emergency Medicine

## 2024-10-30 ENCOUNTER — Emergency Department (HOSPITAL_COMMUNITY)

## 2024-10-30 DIAGNOSIS — R569 Unspecified convulsions: Secondary | ICD-10-CM | POA: Diagnosis present

## 2024-10-30 DIAGNOSIS — G43809 Other migraine, not intractable, without status migrainosus: Secondary | ICD-10-CM | POA: Diagnosis not present

## 2024-10-30 DIAGNOSIS — R55 Syncope and collapse: Secondary | ICD-10-CM | POA: Diagnosis not present

## 2024-10-30 LAB — COMPREHENSIVE METABOLIC PANEL WITH GFR
ALT: 13 U/L (ref 0–44)
AST: 38 U/L (ref 15–41)
Albumin: 4.2 g/dL (ref 3.5–5.0)
Alkaline Phosphatase: 76 U/L (ref 38–126)
Anion gap: 14 (ref 5–15)
BUN: 15 mg/dL (ref 6–20)
CO2: 21 mmol/L — ABNORMAL LOW (ref 22–32)
Calcium: 9.7 mg/dL (ref 8.9–10.3)
Chloride: 102 mmol/L (ref 98–111)
Creatinine, Ser: 0.95 mg/dL (ref 0.44–1.00)
GFR, Estimated: >60 mL/min
Glucose, Bld: 74 mg/dL (ref 70–99)
Potassium: 5 mmol/L (ref 3.5–5.1)
Sodium: 136 mmol/L (ref 135–145)
Total Bilirubin: 0.5 mg/dL (ref 0.0–1.2)
Total Protein: 7.2 g/dL (ref 6.5–8.1)

## 2024-10-30 LAB — CBG MONITORING, ED
Glucose-Capillary: 69 mg/dL — ABNORMAL LOW (ref 70–99)
Glucose-Capillary: 70 mg/dL (ref 70–99)
Glucose-Capillary: 114 mg/dL — ABNORMAL HIGH (ref 70–99)

## 2024-10-30 LAB — HCG, SERUM, QUALITATIVE: Preg, Serum: NEGATIVE

## 2024-10-30 MED ORDER — ACETAMINOPHEN 500 MG PO TABS
1000.0000 mg | ORAL_TABLET | Freq: Once | ORAL | Status: AC
  Administered 2024-10-30: 1000 mg via ORAL
  Filled 2024-10-30: qty 2

## 2024-10-30 MED ORDER — PROCHLORPERAZINE EDISYLATE 10 MG/2ML IJ SOLN
10.0000 mg | Freq: Once | INTRAMUSCULAR | Status: AC
  Administered 2024-10-30: 10 mg via INTRAVENOUS
  Filled 2024-10-30: qty 2

## 2024-10-30 MED ORDER — GLUCOSE 40 % PO GEL
1.0000 | Freq: Once | ORAL | Status: AC
  Administered 2024-10-30: 31 g via ORAL
  Filled 2024-10-30: qty 1.21

## 2024-10-30 MED ORDER — MORPHINE SULFATE (PF) 4 MG/ML IV SOLN
4.0000 mg | Freq: Once | INTRAVENOUS | Status: AC
  Administered 2024-10-30: 4 mg via INTRAVENOUS
  Filled 2024-10-30: qty 1

## 2024-10-30 NOTE — ED Provider Notes (Signed)
 " [facility] AT [facility] Provider Note   CSN: [id number] Arrival date & time: 10/30/24  9063     Patient presents with: Seizures   Toni [name] is a 49 y.o. female.  {Add pertinent medical, surgical, social history, OB history to HPI:32947}  Seizures    Patient has history of migraines chronic neck pain acid reflux menorrhagia, depression, prior bariatric surgery.  Patient was also admitted to the hospital 6 days ago for an episode of possible seizure.  Patient also noted to have an AKI.  Patient had EEG monitoring during hospitalization.  Patient had 3 nonelective events.  Neurology recommended outpatient follow-up.  No antiepileptic drugs are recommended.  Patient presents to the ED for evaluation of a possible seizure today.  Patient states she was home she started to feel lightheaded and dizzy as if she was going to have a seizure.  Patient was looking at her phone and knows it was around 8:00.  Patient reports then waking up at around 815.  Prior to Admission medications  Medication Sig Start Date End Date Taking? Authorizing Provider  ALPRAZolam  (XANAX ) 1 MG tablet Take 2 tablets (2 mg total) by mouth 2 (two) times daily. 06/09/24   McGowen, Aleene DEL, MD  benzonatate  (TESSALON ) 200 MG capsule Take 1 capsule (200 mg total) by mouth 2 (two) times daily as needed for cough. Patient not taking: Reported on 10/25/2024 09/10/24   Sherlynn Madden, MD  Calcium  250 MG CAPS Take 1 capsule by mouth 3 (three) times daily.    [provider]  cyanocobalamin  (VITAMIN B12) 1000 MCG/ML injection Inject 1 mL (1,000 mcg total) into the muscle every 2 weeks 06/09/24   McGowen, Aleene DEL, MD  ESTARYLLA  0.25-35 MG-MCG tablet Take 1 tablet by mouth daily. 01/16/23   Armond Cape, MD  famotidine  (PEPCID ) 20 MG tablet Take 1 tablet (20 mg total) by mouth 2 (two) times daily. Patient taking differently: Take 20 mg by mouth daily as needed for heartburn. 06/09/24    McGowen, Aleene DEL, MD  Ferrous Fumarate  (HEMOCYTE - 106 MG FE) 324 (106 Fe) MG TABS tablet Take 1 tablet (106 mg of iron  total) by mouth daily. Patient taking differently: Take 1 tablet by mouth every other day. 06/09/24   McGowen, Aleene DEL, MD  FLUoxetine  (PROZAC ) 40 MG capsule Take 1 capsule (40 mg total) by mouth daily. Patient taking differently: Take 40 mg by mouth at bedtime. 08/30/24   Arfeen, Leni DASEN, MD  gabapentin  (NEURONTIN ) 400 MG capsule Take 1 capsule (400 mg total) by mouth 2 (two) times daily. 08/30/24   Arfeen, Leni T, MD  glucose blood (FREESTYLE LITE) test strip USE TO CHECK BLOOD SUGAR 3 TIMES A DAY 06/06/20   McGowen, Aleene DEL, MD  lamoTRIgine  (LAMICTAL ) 150 MG tablet Take 1 tablet (150 mg total) by mouth daily. Patient taking differently: Take 150 mg by mouth at bedtime. 08/30/24   Arfeen, Leni DASEN, MD  levocetirizine (XYZAL ) 5 MG tablet Take 1 tablet (5 mg total) by mouth every evening. 08/26/24   McGowen, Aleene DEL, MD  naloxone  (NARCAN ) nasal spray 4 mg/0.1 mL Place 1 spray into the nose once. Patient not taking: No sig reported 02/14/23   [provider]  omeprazole  (PRILOSEC) 40 MG capsule Take 1 capsule (40 mg total) by mouth 2 (two) times daily. 06/09/24   McGowen, Aleene DEL, MD  ondansetron  (ZOFRAN -ODT) 4 MG disintegrating tablet Take 1 tablet (4 mg total) by mouth every 8 (eight)  hours as needed. Patient not taking: Reported on 10/25/2024 06/09/24   McGowen, Philip H, MD  oxyCODONE -acetaminophen  (PERCOCET) 10-325 MG tablet Take 1 tablet by mouth every 4 (four) hours as needed for pain.    [provider]  Ubrogepant  (UBRELVY ) 50 MG TABS 1-2 tabs po once a day as needed for migraine headache 06/09/24   McGowen, Aleene DEL, MD  Vitamin D , Ergocalciferol , (DRISDOL ) 1.25 MG (50000 UNIT) CAPS capsule Take 1 capsule (50,000 Units total) by mouth 2 (two) times a week. 02/05/24   McGowen, Aleene DEL, MD    Allergies: Ibuprofen    Review of Systems  Neurological:  Positive  for seizures.    Updated Vital Signs BP 134/64   Pulse 69   Temp 97.6 F (36.4 C) (Axillary)   Resp 11   Ht 1.676 m (5' 6)   Wt 99.8 kg   SpO2 100%   BMI 35.51 kg/m   Physical Exam Vitals and nursing note reviewed.  Constitutional:      General: She is not in acute distress.    Appearance: She is well-developed.  HENT:     Head: Normocephalic and atraumatic.     Right Ear: External ear normal.     Left Ear: External ear normal.  Eyes:     General: No scleral icterus.       Right eye: No discharge.        Left eye: No discharge.     Conjunctiva/sclera: Conjunctivae normal.  Neck:     Trachea: No tracheal deviation.  Cardiovascular:     Rate and Rhythm: Normal rate and regular rhythm.  Pulmonary:     Effort: Pulmonary effort is normal. No respiratory distress.     Breath sounds: Normal breath sounds. No stridor. No wheezing or rales.  Abdominal:     General: Bowel sounds are normal. There is no distension.     Palpations: Abdomen is soft.     Tenderness: There is no abdominal tenderness. There is no guarding or rebound.  Musculoskeletal:        General: No tenderness or deformity.     Cervical back: Neck supple.  Skin:    General: Skin is warm and dry.     Findings: No rash.  Neurological:     General: No focal deficit present.     Mental Status: She is alert.     Cranial Nerves: No cranial nerve deficit, dysarthria or facial asymmetry.     Sensory: No sensory deficit.     Motor: No abnormal muscle tone or seizure activity.     Coordination: Coordination normal.  Psychiatric:        Mood and Affect: Mood normal.     (all labs ordered are listed, but only abnormal results are displayed) Labs Reviewed  COMPREHENSIVE METABOLIC PANEL WITH GFR - Abnormal; Notable for the following components:      Result Value   CO2 21 (*)    All other components within normal limits  CBG MONITORING, ED - Abnormal; Notable for the following components:   Glucose-Capillary 69  (*)    All other components within normal limits  CBG MONITORING, ED - Abnormal; Notable for the following components:   Glucose-Capillary 114 (*)    All other components within normal limits  HCG, SERUM, QUALITATIVE  CBC WITH DIFFERENTIAL/PLATELET  CBC WITH DIFFERENTIAL/PLATELET  CBG MONITORING, ED    EKG: EKG Interpretation Date/Time:  Saturday October 30 2024 09:45:56 EST Ventricular Rate:  72 PR Interval:  118 QRS Duration:  96 QT Interval:  418 QTC Calculation: 458 R Axis:   19  Text Interpretation: Sinus rhythm Borderline short PR interval Left ventricular hypertrophy Confirmed by Randol Simmonds [phone]) on 10/30/2024 9:49:28 AM  Radiology: CT Head Wo Contrast Result Date: 10/30/2024 EXAM: CT HEAD WITHOUT CONTRAST 10/30/2024 11:50:29 AM TECHNIQUE: CT of the head was performed without the administration of intravenous contrast. Automated exposure control, iterative reconstruction, and/or weight based adjustment of the mA/kV was utilized to reduce the radiation dose to as low as reasonably achievable. COMPARISON: CT head 06/27/2022. CLINICAL HISTORY: 49 year old female. Head trauma, moderate-severe. Seizure activity, current. FINDINGS: BRAIN AND VENTRICLES: No acute hemorrhage. No evidence of acute infarct. No hydrocephalus. No extra-axial collection. No mass effect or midline shift. Stable and normal brain volume. No suspicious intracranial vascular hyperdensity. Gray white differentiation stable and with minimal limits. Mild calcified atherosclerosis at the skull base. ORBITS: No gaze deviation. SINUSES: Basal sinuses, tympanic cavities and mastoids are well aerated. SOFT TISSUES AND SKULL: No acute soft tissue abnormality. No skull fracture. IMPRESSION: 1. Normal for age non-contrast head CT 2. No acute traumatic injury identified. Electronically signed by: Helayne Hurst MD 10/30/2024 11:58 AM EST RP Workstation: [id number]    {Document cardiac monitor, telemetry assessment procedure when  appropriate:32947} Procedures   Medications Ordered in the ED  prochlorperazine  (COMPAZINE ) injection 10 mg (10 mg Intravenous Given 10/30/24 1233)  dextrose  (GLUTOSE) oral gel 40% (peds > 20kg and adults) (31 g Oral Given 10/30/24 1242)  acetaminophen  (TYLENOL ) tablet 1,000 mg (1,000 mg Oral Given 10/30/24 1234)  morphine  (PF) 4 MG/ML injection 4 mg (4 mg Intravenous Given 10/30/24 1248)    Clinical Course as of 10/30/24 1448  Sat Oct 30, 2024  1217 Head CT without acute findings [JK]    Clinical Course User Index [JK] Randol Simmonds, MD   {Click here for ABCD2, HEART and other calculators REFRESH Note before signing:1}                              Medical Decision Making Amount and/or Complexity of Data Reviewed Labs: ordered. Decision-making details documented in ED Course. Radiology: ordered and independent interpretation performed.  Risk OTC drugs. Prescription drug management.   ***  {Document critical care time when appropriate  Document review of labs and clinical decision tools ie CHADS2VASC2, etc  Document your independent review of radiology images and any outside records  Document your discussion with family members, caretakers and with consultants  Document social determinants of health affecting pt's care  Document your decision making why or why not admission, treatments were needed:32947:::1}   Final diagnoses:  None    ED Discharge Orders     None        "

## 2024-10-30 NOTE — ED Notes (Signed)
 Ambulated to restroom

## 2024-10-30 NOTE — ED Triage Notes (Signed)
 PT BIB [facility] d/t unwitnessed Seizure 15 mins ago pt states last 1-15 mins, thinks lowered to floor Didn't hit head pulled calf Saw stars then seizure. Last week had one was admitted here for 3 days  Hx seizures  176/82 74 hr 100%  98 cbg

## 2024-10-30 NOTE — ED Notes (Signed)
 Patient transported to CT

## 2024-10-30 NOTE — ED Notes (Signed)
 Blood sugar 69, EDP made aware. Gave pt orange juice.

## 2024-10-30 NOTE — Discharge Instructions (Addendum)
 Follow-up with the neurologist as planned.  Continue your current medications

## 2024-10-30 NOTE — ED Notes (Signed)
 CCMD called.

## 2024-10-31 ENCOUNTER — Telehealth: Admitting: Family

## 2024-10-31 ENCOUNTER — Encounter: Payer: Self-pay | Admitting: Family Medicine

## 2024-10-31 ENCOUNTER — Encounter

## 2024-10-31 DIAGNOSIS — F411 Generalized anxiety disorder: Secondary | ICD-10-CM

## 2024-10-31 NOTE — Progress Notes (Signed)
 I am sorry, but we can not refill controlled medications through a virtual visits.   NOTE: There will be NO CHARGE for this E-Visit   If you are having a true medical emergency, please call 911.     For an urgent face to face visit, [facility] has multiple urgent care centers for your convenience.  Click the link below for the full list of locations and hours, walk-in wait times, appointment scheduling options and driving directions:  Urgent Care - [place], [place], [place], [place], [place], [place]  [place]     Your MyChart E-visit questionnaire answers were reviewed by a board certified advanced clinical practitioner to complete your personal care plan based on your specific symptoms.    Thank you for using e-Visits.

## 2024-11-01 ENCOUNTER — Ambulatory Visit: Payer: Self-pay | Admitting: Family Medicine

## 2024-11-01 ENCOUNTER — Ambulatory Visit (HOSPITAL_COMMUNITY): Payer: PRIVATE HEALTH INSURANCE

## 2024-11-01 ENCOUNTER — Other Ambulatory Visit: Payer: Self-pay

## 2024-11-01 DIAGNOSIS — F411 Generalized anxiety disorder: Secondary | ICD-10-CM

## 2024-11-01 DIAGNOSIS — F321 Major depressive disorder, single episode, moderate: Secondary | ICD-10-CM

## 2024-11-01 MED ORDER — FLUTICASONE PROPIONATE 50 MCG/ACT NA SUSP: 2.0000 | Freq: Every day | NASAL | 0 refills | Status: AC

## 2024-11-01 MED ORDER — ALPRAZOLAM 1 MG PO TABS: 2.0000 mg | ORAL_TABLET | Freq: Two times a day (BID) | ORAL | 0 refills | Status: AC

## 2024-11-01 NOTE — Telephone Encounter (Signed)
 Spoke with patient regarding results/recommendations.
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
## 2024-11-01 NOTE — Telephone Encounter (Signed)
 Pharmacist from [facility] ([address] called to verify the pt's xanax  prescription order. The order is for two 1mg  tablets BID, but the pt was at an alternate [facility] ([address]) and requesting the prescription early, as well as stating her daily dose is higher than what the pharmacy has on file. Please advise the pharmacy on the correct order.   FYI Only or Action Required?: Action required by provider: clinical question for provider.  Patient was last seen in primary care on 09/10/2024 by Sherlynn Madden, MD.  Called Nurse Triage reporting Medication Refill.  Symptom Triage Disposition: Call PCP When Office is Open  Patient/caregiver understands and will follow disposition?: Yes     Reason for Disposition  [1] Caller requesting NON-URGENT health information AND [2] PCP's office is the best resource  Answer Assessment - Initial Assessment Questions 1. REASON FOR CALL: What is the main reason for your call? or How can I best help you?   Pharmacist from [facility] ([address]) called to verify the pt's xanax  prescription order. The order is for two 1mg  tablets BID, but the pt was at an alternate [facility] ([address]) and requesting the prescription early, as well as stating her daily dose is higher than what the pharmacy has on file.  Protocols used: Information Only Call - No Triage-A-AH

## 2024-11-01 NOTE — Telephone Encounter (Signed)
 Alprazolam  RF sent

## 2024-11-01 NOTE — Telephone Encounter (Signed)
 Patient is currently at the Pharmacy at [address] Pharmacist, Sarah, called and advised that she needs the prescriber to authorize the patient getting this prescription two days early and what the dose is supposed to be now. Patient's contact number is [phone] Pharmacy at [facility] is [phone]  They states the office called the wrong pharmacy on [address] and they were supposed to call [address].  See previous triage notes from today in regards to this.

## 2024-11-01 NOTE — Telephone Encounter (Signed)
 FYI Only or Action Required?: Action required by provider: medication refill request. PT needs refill on Xanax  called in to [facility] [phone]. Pt has been taking an extra pill a day d/t stress of job loss and frustration with unemployment claim processing.  Rx may need up-dating to account for extra medication being taken.  Provider will need to state that pt can get refill early  Patient was last seen in primary care on 09/10/2024 by Sherlynn Madden, MD.  Called Nurse Triage reporting Anxiety.  Symptoms began ONgoing.  Interventions attempted: Prescription medications: xanax  and therapy.  Symptoms are: gradually worsening.  Triage Disposition: See PCP Within 2 Weeks  Patient/caregiver understands and will follow disposition?: yes                  Reason for Triage: Anxiety   Reason for Disposition  [1] Symptoms of anxiety or panic attack AND [2] is a chronic symptom (recurrent or ongoing AND present > 4 weeks)  Answer Assessment - Initial Assessment Questions 1. CONCERN: Did anything happen that prompted you to call today?      Out of Medication 2. ANXIETY SYMPTOMS: Can you describe how you (your loved one; patient) have been feeling? (e.g., tense, restless, panicky, anxious, keyed up, overwhelmed, sense of impending doom).      Overwhelmed, frustrated 3. ONSET: How long have you been feeling this way? (e.g., hours, days, weeks)     Ongoing  - lost job at the end of December 4. SEVERITY: How would you rate the level of anxiety? (e.g., 0 - 10; or mild, moderate, severe).     moderate 6. HISTORY: Have you felt this way before? Have you ever been diagnosed with an anxiety problem in the past? (e.g., generalized anxiety disorder, panic attacks, PTSD). If Yes, ask: How was this problem treated? (e.g., medicines, counseling, etc.)     yes  8. TREATMENT:  What has been done so far to treat this anxiety? (e.g., medicines, relaxation  strategies). What has helped?     Medications  and therapy 9. THERAPIST: Do you have a counselor or therapist? If Yes, ask: What is their name?     yes  Protocols used: Anxiety and Panic Attack-A-AH

## 2024-11-01 NOTE — Telephone Encounter (Signed)
 Pt has Rx at the [facility] on [address]. Pt has appointment scheduled for 1 pm on 01/28 due to recent seizures. Rx is able to be transferred electronically per [facility] ([address]) pharmacist

## 2024-11-02 ENCOUNTER — Encounter: Payer: Self-pay | Admitting: Internal Medicine

## 2024-11-02 ENCOUNTER — Encounter (HOSPITAL_COMMUNITY): Payer: Self-pay

## 2024-11-02 ENCOUNTER — Ambulatory Visit: Admitting: Internal Medicine

## 2024-11-02 ENCOUNTER — Ambulatory Visit (HOSPITAL_COMMUNITY)

## 2024-11-02 ENCOUNTER — Telehealth: Payer: Self-pay

## 2024-11-02 ENCOUNTER — Ambulatory Visit: Payer: Self-pay

## 2024-11-02 ENCOUNTER — Ambulatory Visit: Admitting: Sports Medicine

## 2024-11-02 VITALS — BP 122/72 | HR 80 | Temp 97.8°F | Ht 65.0 in | Wt 248.2 lb

## 2024-11-02 DIAGNOSIS — F419 Anxiety disorder, unspecified: Secondary | ICD-10-CM

## 2024-11-02 NOTE — Telephone Encounter (Signed)
 Caller requesting earlier time appt today. Advised nothing available and appt is on wait list.

## 2024-11-02 NOTE — Patient Instructions (Signed)
 Follow up with PCP as scheduled.

## 2024-11-02 NOTE — Progress Notes (Signed)
 " [facility] PRIMARY CARE [facility] [address] [place] [address] Dept: [phone] Dept Fax: [phone]  Acute Care Office Visit  Subjective:   Toni [name] 11/23/1975 11/02/2024  Chief Complaint  Patient presents with   Anxiety    Last week dealing stress on job, struggling with living without pay    HPI:  History of Present Illness   Toni [name] is a 49 year old female who presents with increased stress and medication concerns.  She is experiencing significant stress related to her job, which has been exacerbated by losing her job at the end of December due to lack of work. She is currently seeing a counselor and is on medication for anxiety, including fluoxetine  40 mg once daily and alprazolam , which was recently adjusted to allow for two 1 mg tablets twice a day. PCP had sent in Rx on 11/01/24. She is concerned about needing to increase her Xanax  dosage due to heightened stress. She also takes Lamictal  for depression prescribed by her psychiatrist.  She mentions having seizures earlier this week, with a noted low blood sugar level of 67 and low blood pressure of 86/50 last week. She thinks this is the cause. No hx of seizures. Not currently on anti-epileptic medications.  Despite not feeling hungry, she reports that she is trying to eat more frequently.  No thoughts of self-harm or others. She is actively seeking unemployment benefits, which has been challenging, adding to her stress.       The following portions of the patient's history were reviewed and updated as appropriate: past medical history, past surgical history, family history, social history, allergies, medications, and problem list.   Patient Active Problem List   Diagnosis Date Noted   Syncope 10/25/2024   Anemia, iron  deficiency 12/09/2023   Nausea, vomiting and diarrhea 06/12/2023   Lumbar radiculopathy 05/07/2021   Hyponatremia 04/27/2021   AKI (acute kidney  injury) 04/27/2021   Orthostatic hypotension 04/26/2021   Pain of right thumb 12/07/2019   Pain, elbow joint 12/07/2019   MDD (major depressive disorder), recurrent episode, moderate (HCC) 08/19/2019   Allergic rhinitis 04/20/2019   Vaginal irritation 05/06/2018   Menorrhagia 04/03/2018   Long term (current) use of opiate analgesic 10/31/2017   Insomnia secondary to anxiety 05/02/2016   Altered mental status 04/11/2016   Hypoglycemia 03/21/2016   Encephalopathy acute 03/21/2016   Vaginal discharge 02/13/2016   Visit for preventive health examination 11/24/2015   Vitamin B12 deficiency 06/05/2015   Vitamin D  deficiency 04/30/2015   Chronic neck pain 04/11/2015   GERD (gastroesophageal reflux disease) 04/11/2015   Class 3 obesity with BMI of 43.80 04/11/2015   Anxiety and depression 04/11/2015   Migraine    History of bariatric surgery 12/19/2001   Past Medical History:  Diagnosis Date   Acute renal failure 04/26/2021   prerenal azotemia   Allergic rhinitis    Altered sensorium due to hypoglycemia 03/2016   Anxiety and depression    started with PPD   Chronic neck pain    Myofascial pain syndrome per [facility] [facility], oxycodone  per their office pain med contract.  (Normal cervical MRI 2010 per ortho notes).   Easy bruisability 2011   Hx of: saw Hematologist (Dr. Freddie) and w/u neg for von Willebrand desease.   GERD (gastroesophageal reflux disease)    hx gastric ulcer and upper GI bleed, ? NSAID induced   History of PCR DNA positive for HSV1    History of PCR DNA positive for  HSV2    History of stomach ulcers    Hypertension    Iron  deficiency 10/2019   WITHOUT anemia-suspect malabsorption. Pt to start FeSO4 325 qd 10/28/19   Irregular menses 03/2018   GYN started Femynor  0.25-0.35.  [facility] as of 10/2018.  u/s wnl.  Endom bx NEG. OCPs helped.   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 Kassie   Seizure [facility]) 2015   s/p eval with neuro, no medication, no events since   Syncope    with ? seizure in 2015, eval with neruo   Vitamin B12 deficiency    Vitamin D  deficiency    Past Surgical History:  Procedure Laterality Date   CESAREAN SECTION  2006   COLONOSCOPY W/ POLYPECTOMY     adenomas 11/17/23-->recall 7 yrs   EEG     (sleep) 10/25/24--->no epileptic activity   ELBOW X-RAY Left 12/07/2019   ENDOMETRIAL BIOPSY     EYE SURGERY Bilateral    2001   FINGER X-RAY Right 12/07/2019   ROUX-EN-Y GASTRIC BYPASS  01/05/2002   TONSILLECTOMY AND ADENOIDECTOMY     TUBAL LIGATION  2006   tubes in ears both Bilateral    Family History  Problem Relation Age of Onset   Arthritis Mother    Hyperlipidemia Mother    Heart disease Mother    Diabetes Mother    Asthma Mother    Aneurysm Father        deceased   Lung cancer Maternal Aunt        smoker   Lung cancer Maternal Aunt        smoker   Colon cancer Maternal Grandmother    Esophageal cancer Neg Hx    Rectal cancer Neg Hx    Stomach cancer Neg Hx    Current Medications[1] Allergies[2]   ROS: A complete ROS was performed with pertinent positives/negatives noted in the HPI. The remainder of the ROS are negative.    Objective:   Today's Vitals   11/02/24 1015  BP: 122/72  Pulse: 80  Temp: 97.8 F (36.6 C)  TempSrc: Temporal  SpO2: 98%  Weight: 248 lb 3.2 oz (112.6 kg)  Height: 5' 5 (1.651 m)    GENERAL: Well-appearing, in NAD. Well nourished.  SKIN: Pink, warm and dry.  NECK: Trachea midline. Full ROM w/o pain or tenderness. No lymphadenopathy.  RESPIRATORY: Chest wall symmetrical. Respirations even and non-labored. Breath sounds clear to auscultation bilaterally.  CARDIAC: S1, S2 present, regular rate and rhythm. Peripheral pulses 2+ bilaterally.   EXTREMITIES: Without clubbing, cyanosis, or edema.  NEUROLOGIC: No motor or sensory deficits. Steady, even gait.  PSYCH/MENTAL STATUS: Alert, oriented x 3. Cooperative, appropriate mood and affect.    No results found for any visits on 11/02/24.    Assessment & Plan:   Assessment and Plan    Anxiety disorder Exacerbated by stressors including job loss. Managed with fluoxetine  and alprazolam . No suicidal ideation, but stress-related anger present. Blood pressure stable. Patient actively eating Kind protein bar during visit.  - Continue fluoxetine  40 mg once daily. - Continue Alprazolam  . Advised patient I would not be increasing her Alprazolam  as she is already on a high dose, and PCP sent in Rx on 11/01/24. This  will need to be addressed by her PCP and/or psychiatrist.  - Continue counseling sessions.  No orders of the defined types were placed in this encounter.  No orders of the defined types were placed in this encounter.  Lab Orders  No laboratory test(s) ordered today   No images are attached to the encounter or orders placed in the encounter.   Rosina Senters, FNP    [1]  Current Outpatient Medications:    ALPRAZolam  (XANAX ) 1 MG tablet, Take 2 tablets (2 mg total) by mouth 2 (two) times daily., Disp: 120 tablet, Rfl: 0   Calcium  250 MG CAPS, Take 1 capsule by mouth 3 (three) times daily., Disp: , Rfl:    cyanocobalamin  (VITAMIN B12) 1000 MCG/ML injection, Inject 1 mL (1,000 mcg total) into the muscle every 2 weeks, Disp: 10 mL, Rfl: 0   ESTARYLLA  0.25-35 MG-MCG tablet, Take 1 tablet by mouth daily., Disp: , Rfl:    fluticasone  (FLONASE ) 50 MCG/ACT nasal spray, Place 2 sprays into both nostrils daily., Disp: 30 g, Rfl: 0   gabapentin  (NEURONTIN ) 400 MG capsule, Take 1 capsule (400 mg total) by mouth 2 (two) times daily., Disp: 60 capsule, Rfl: 2   glucose blood (FREESTYLE LITE) test strip, USE TO CHECK BLOOD SUGAR 3 TIMES A DAY, Disp: 200 strip, Rfl: 12   levocetirizine  (XYZAL ) 5 MG tablet, Take 1 tablet (5 mg total) by mouth every evening., Disp: 30 tablet, Rfl: 2   omeprazole  (PRILOSEC) 40 MG capsule, Take 1 capsule (40 mg total) by mouth 2 (two) times daily., Disp: 180 capsule, Rfl: 1   oxyCODONE -acetaminophen  (PERCOCET) 10-325 MG tablet, Take 1 tablet by mouth every 4 (four) hours as needed for pain., Disp: , Rfl:    Ubrogepant  (UBRELVY ) 50 MG TABS, 1-2 tabs po once a day as needed for migraine headache, Disp: 30 tablet, Rfl: 1   Vitamin D , Ergocalciferol , (DRISDOL ) 1.25 MG (50000 UNIT) CAPS capsule, Take 1 capsule (50,000 Units total) by mouth 2 (two) times a week., Disp: 24 capsule, Rfl: 1   benzonatate  (TESSALON ) 200 MG capsule, Take 1 capsule (200 mg total) by mouth 2 (two) times daily as needed for cough. (Patient not taking: Reported on 10/25/2024), Disp: 20 capsule, Rfl: 0   famotidine  (PEPCID ) 20 MG tablet, Take 1 tablet (20 mg total) by mouth 2 (two) times daily. (Patient taking differently: Take 20 mg by mouth daily as needed for heartburn.), Disp: 180 tablet, Rfl: 1   Ferrous Fumarate  (HEMOCYTE - 106 MG FE) 324 (106 Fe) MG TABS tablet, Take 1 tablet (106 mg of iron  total) by mouth daily. (Patient taking differently: Take 1 tablet by mouth every other day.), Disp: 30 tablet, Rfl: 6   FLUoxetine  (PROZAC ) 40 MG capsule, Take 1 capsule (40 mg total) by mouth daily. (Patient taking differently: Take 40 mg by mouth at bedtime.), Disp: 90 capsule, Rfl: 0   lamoTRIgine  (LAMICTAL ) 150 MG tablet, Take 1 tablet (150 mg total) by mouth daily. (Patient taking differently: Take 150 mg by mouth at bedtime.), Disp: 90 tablet, Rfl: 0   naloxone  (NARCAN ) nasal spray 4 mg/0.1 mL, Place 1 spray into the nose once. (Patient not taking: No sig reported), Disp: , Rfl:    ondansetron  (ZOFRAN -ODT) 4 MG disintegrating tablet, Take 1 tablet (4 mg total) by mouth every 8 (eight) hours as needed. (Patient not taking: Reported on 10/25/2024), Disp: 20 tablet, Rfl: 1 [2]   Allergies Allergen Reactions   Ibuprofen Nausea Only    Causes  ulcers   "

## 2024-11-02 NOTE — Telephone Encounter (Signed)
"  Rx picked up   "

## 2024-11-02 NOTE — Telephone Encounter (Signed)
" °  FYI Only or Action Required?: Action required by provider: request for appointment.  Patient was last seen in primary care on 09/10/2024 by Sherlynn Madden, MD.  Called Nurse Triage reporting Anxiety.  Symptoms began several weeks ago.  Interventions attempted: Prescription medications:  SABRA  Symptoms are: gradually worsening. Pt.'s employer shut down end of January and is trying to collect unemployment. Has caused increase in her anxiety. Afraid of eviction.  Triage Disposition: See Physician Within 24 Hours  Patient/caregiver understands and will follow disposition?: Yes      Reason for Triage: anxiety, depression, stress from losing job, may have cause a seizure. Has an appointment tomorrow, would be open to seeing anyone else today.    Reason for Disposition  Patient sounds very upset or troubled to the triager  Answer Assessment - Initial Assessment Questions 1. CONCERN: Did anything happen that prompted you to call today?      anxiety 2. ANXIETY SYMPTOMS: Can you describe how you (your loved one; patient) have been feeling? (e.g., tense, restless, panicky, anxious, keyed up, overwhelmed, sense of impending doom).      anxiety 3. ONSET: How long have you been feeling this way? (e.g., hours, days, weeks)     Few weeks 4. SEVERITY: How would you rate the level of anxiety? (e.g., 0 - 10; or mild, moderate, severe).     10 5. FUNCTIONAL IMPAIRMENT: How have these feelings affected your ability to do daily activities? Have you had more difficulty than usual doing your normal daily activities? (e.g., getting better, same, worse; self-care, school, work, interactions)     same 6. HISTORY: Have you felt this way before? Have you ever been diagnosed with an anxiety problem in the past? (e.g., generalized anxiety disorder, panic attacks, PTSD). If Yes, ask: How was this problem treated? (e.g., medicines, counseling, etc.)     yes 7. RISK OF HARM - SUICIDAL  IDEATION: Do you ever have thoughts of hurting or killing yourself? If Yes, ask:  Do you have these feelings now? Do you have a plan on how you would do this?     If I get evicted. 8. TREATMENT:  What has been done so far to treat this anxiety? (e.g., medicines, relaxation strategies). What has helped?     medication 9. THERAPIST: Do you have a counselor or therapist? If Yes, ask: What is their name?     yes 10. POTENTIAL TRIGGERS: Do you drink caffeinated beverages (e.g., coffee, colas, teas), and how much daily? Do you drink alcohol  or use any drugs? Have you started any new medicines recently?       no 11. PATIENT SUPPORT: Who is with you now? Who do you live with? Do you have family or friends who you can talk to?        Has a son 67. OTHER SYMPTOMS: Do you have any other symptoms? (e.g., feeling depressed, trouble concentrating, trouble sleeping, trouble breathing, palpitations or fast heartbeat, chest pain, sweating, nausea, or diarrhea)       no 13. PREGNANCY: Is there any chance you are pregnant? When was your last menstrual period?       no  Protocols used: Anxiety and Panic Attack-A-AH  "

## 2024-11-02 NOTE — Telephone Encounter (Signed)
 Copied from CRM [id number]. Topic: General - Call Back - No Documentation >> Nov 01, 2024  8:57 AM Rea BROCKS wrote: Reason for CRM: Pt is calling back to speak with Sheena. She was left a message today in regards to her prescriptions.    [phone] BENNIE)

## 2024-11-02 NOTE — Telephone Encounter (Signed)
" °  FYI Only or Action Required?: Action required by provider: request for appointment.  Patient was last seen in primary care on 11/02/2024 by Billy Knee, FNP.  Called Nurse Triage reporting Seizures and Stress.  Symptoms began several weeks ago.  Interventions attempted: Rest, hydration, or home remedies.  Symptoms are: stable.  Triage Disposition: Call PCP Within 24 Hours  Patient/caregiver understands and will follow disposition?: No, wishes to speak with PCP    Reason for Disposition  [1] Caller requests to speak ONLY to PCP AND [2] NON-URGENT question  Answer Assessment - Initial Assessment Questions Seen in office today . Patient reports had a seizure last week. That was low blood pressure  , 86/50 . And another one this week low blood sugar most recent seizure was 1 week ago.   No history of seizures. Reviewed chart saw provider in office today discussed seizures activity  and medications for anxiety as well as increased stress levels.  Wants to be seen again wants to see PCP next time wants to see PCP at primary care office.  Has hospital follow up tomorrow for separate concerns,  but wants to come in today to follow up again. Wants to discuss medication management and discuss making changes on medications . Advised patient will route to office for review further advisement patient is agreeable and confirms appointment tomorrow 11/03/2024 1:00 PM McGowen, Aleene DEL, MD [facility]  Patient denies the following chest pain, shortness of breath, dizziness    Patient denies any thoughts of self harm or harm to others     The RN reviewed office visit today   Assessment and Plan    Anxiety disorder Exacerbated by stressors including job loss. Managed with fluoxetine  and alprazolam . No suicidal ideation, but stress-related anger present. Blood pressure stable. Patient actively eating Kind protein bar during visit.  - Continue fluoxetine  40 mg once daily. - Continue  Alprazolam  . Advised patient I would not be increasing her Alprazolam  as she is already on a high dose, and PCP sent in Rx on 11/01/24. This will need to be addressed by her PCP and/or psychiatrist.  - Continue counseling sessions.      1. REASON FOR CALL or QUESTION: What is your reason for calling today? or How can I best     Was seen today in the office today for concerns but wants to come back in to see PCP at primary care office today to discuss 2. CALLER: Document the source of call. (e.g., laboratory staff, caregiver or patient).     Patient  Protocols used: PCP Call - No Triage-A-AH  Message from [place] sent at 11/02/2024 11:20 AM EST  Reason for Triage: continuous seizure, depression, blood pressure 86/60 , glucose 69   "

## 2024-11-03 ENCOUNTER — Encounter (HOSPITAL_COMMUNITY): Payer: Self-pay

## 2024-11-03 ENCOUNTER — Telehealth: Admitting: Family Medicine

## 2024-11-03 ENCOUNTER — Ambulatory Visit (HOSPITAL_COMMUNITY)

## 2024-11-03 ENCOUNTER — Inpatient Hospital Stay: Admitting: Family Medicine

## 2024-11-03 NOTE — Progress Notes (Unsigned)
 OFFICE VISIT  11/03/2024  CC: No chief complaint on file.   Patient is a 49 y.o. female who presents for 67-month follow-up hypertension, anxiety, and recent hospitalization for syncope.  INTERIM HX: ***  Admitted 1/18 to 10/26/2024 for syncope.  Seizure evaluation normal. Acute kidney injury.  Was taken off lisinopril  and this resolved.  It was surmised that she may have been having orthostatic hypotension as well as postprandial hypoglycemia.    She did have some nonepileptic episodes while in the hospital. EKG normal. She had mild hyperkalemia.  She was given 1 dose of Lokelma  prior to discharge.  She was told to arrange neurology follow-up as an outpatient. Discharge medications: Hold lisinopril . Continue alprazolam  1 mg tabs, 2 tabs twice daily. Tessalon  Perles, calcium , vitamin B12 injections,Estarylla  0.25-35, famotidine  20 mg twice daily, ferrous fumarate  324 mg daily, fluoxetine  40 mg a day, gabapentin  400 mg twice daily, Lamictal  150 mg tab daily, Xyzal  5 mg daily, naloxone , omeprazole  40 mg twice daily, Zofran  ODT 4 mg, Percocet 10/325, Ubrelvy  50 mg, vitamin D  50,000 unit.  10/30/2024 ED visit for dizziness and headache.  Glucose 67 at that time.  CT head without contrast was normal. She was treated with Compazine  and morphine  as well as some dextrose  oral gel.  Currently:  ***  PMP AWARE reviewed today: most recent rx for alprazolam  was filled 11/01/2024, # 120, rx by me.  Her gabapentin  and oxycodone  are prescribed by different providers. No red flags.   Past Medical History:  Diagnosis Date   Acute renal failure 04/26/2021   prerenal azotemia   Allergic rhinitis    Altered sensorium due to hypoglycemia 03/2016   Anxiety and depression    started with PPD   Chronic neck pain    Myofascial pain syndrome per [facility] [facility], oxycodone  per their office pain med contract.  (Normal cervical MRI 2010 per ortho notes).   Easy bruisability 2011   Hx of: saw Hematologist (Dr.  Freddie) and w/u neg for von Willebrand desease.   GERD (gastroesophageal reflux disease)    hx gastric ulcer and upper GI bleed, ? 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 Pt to start FeSO4 325 qd 10/28/19   Irregular menses 03/2018   GYN started Femynor  0.25-0.35.  [facility] as of 10/2018.  u/s wnl.  Endom bx NEG. OCPs helped.   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 Kassie   Seizure [facility]) 2015   s/p eval with neuro, no medication, no events since   Syncope    with ? seizure in 2015, eval with neruo   Vitamin B12 deficiency    Vitamin D  deficiency     Past Surgical History:  Procedure Laterality Date   CESAREAN SECTION  2006   COLONOSCOPY W/ POLYPECTOMY     adenomas 11/17/23-->recall 7 yrs   EEG     (sleep) 10/25/24--->no epileptic activity   ELBOW X-RAY Left 12/07/2019   ENDOMETRIAL BIOPSY     EYE SURGERY Bilateral    2001   FINGER X-RAY Right  12/07/2019   ROUX-EN-Y GASTRIC BYPASS  01/05/2002   TONSILLECTOMY AND ADENOIDECTOMY     TUBAL LIGATION  2006   tubes in ears both Bilateral     Outpatient Medications Prior to Visit  Medication Sig Dispense Refill   ALPRAZolam  (XANAX ) 1 MG tablet Take 2 tablets (2 mg total) by mouth 2 (two) times daily. 120 tablet 0   benzonatate  (TESSALON ) 200 MG capsule Take 1 capsule (200 mg total) by mouth 2 (two) times daily as needed for cough. (Patient not taking: Reported on 10/25/2024) 20 capsule 0   Calcium  250 MG CAPS Take 1 capsule by mouth 3 (three) times daily.     cyanocobalamin  (VITAMIN B12) 1000 MCG/ML injection Inject 1 mL (1,000 mcg  total) into the muscle every 2 weeks 10 mL 0   ESTARYLLA  0.25-35 MG-MCG tablet Take 1 tablet by mouth daily.     famotidine  (PEPCID ) 20 MG tablet Take 1 tablet (20 mg total) by mouth 2 (two) times daily. (Patient taking differently: Take 20 mg by mouth daily as needed for heartburn.) 180 tablet 1   Ferrous Fumarate  (HEMOCYTE - 106 MG FE) 324 (106 Fe) MG TABS tablet Take 1 tablet (106 mg of iron  total) by mouth daily. (Patient taking differently: Take 1 tablet by mouth every other day.) 30 tablet 6   FLUoxetine  (PROZAC ) 40 MG capsule Take 1 capsule (40 mg total) by mouth daily. (Patient taking differently: Take 40 mg by mouth at bedtime.) 90 capsule 0   fluticasone  (FLONASE ) 50 MCG/ACT nasal spray Place 2 sprays into both nostrils daily. 30 g 0   gabapentin  (NEURONTIN ) 400 MG capsule Take 1 capsule (400 mg total) by mouth 2 (two) times daily. 60 capsule 2   glucose blood (FREESTYLE LITE) test strip USE TO CHECK BLOOD SUGAR 3 TIMES A DAY 200 strip 12   lamoTRIgine  (LAMICTAL ) 150 MG tablet Take 1 tablet (150 mg total) by mouth daily. (Patient taking differently: Take 150 mg by mouth at bedtime.) 90 tablet 0   levocetirizine (XYZAL ) 5 MG tablet Take 1 tablet (5 mg total) by mouth every evening. 30 tablet 2   naloxone  (NARCAN ) nasal spray 4 mg/0.1 mL Place 1 spray into the nose once. (Patient not taking: No sig reported)     omeprazole  (PRILOSEC) 40 MG capsule Take 1 capsule (40 mg total) by mouth 2 (two) times daily. 180 capsule 1   ondansetron  (ZOFRAN -ODT) 4 MG disintegrating tablet Take 1 tablet (4 mg total) by mouth every 8 (eight) hours as needed. (Patient not taking: Reported on 10/25/2024) 20 tablet 1   oxyCODONE -acetaminophen  (PERCOCET) 10-325 MG tablet Take 1 tablet by mouth every 4 (four) hours as needed for pain.     Ubrogepant  (UBRELVY ) 50 MG TABS 1-2 tabs po once a day as needed for migraine headache 30 tablet 1   Vitamin D , Ergocalciferol , (DRISDOL ) 1.25 MG (50000 UNIT) CAPS capsule Take 1  capsule (50,000 Units total) by mouth 2 (two) times a week. 24 capsule 1   No facility-administered medications prior to visit.    Allergies[1]  Review of Systems As per HPI  PE:    11/02/2024   10:15 AM 10/30/2024    2:59 PM 10/30/2024    1:15 PM  Vitals with BMI  Height 5' 5    Weight 248 lbs 3 oz    BMI 41.3    Systolic 122  134  Diastolic 72  64  Pulse 80 72 69     Physical Exam  ***  LABS:  Last CBC Lab Results  Component Value Date   WBC 7.7 10/25/2024   HGB 12.5 10/25/2024   HCT 39.1 10/25/2024   MCV 97.8 10/25/2024   MCH 31.3 10/25/2024   RDW 14.5 10/25/2024   PLT 346 10/25/2024   Lab Results  Component Value Date   IRON  71 06/04/2024   TIBC 322 06/04/2024   FERRITIN 49 06/04/2024   Last metabolic panel Lab Results  Component Value Date   GLUCOSE 74 10/30/2024   NA 136 10/30/2024   K 5.0 10/30/2024   CL 102 10/30/2024   CO2 21 (L) 10/30/2024   BUN 15 10/30/2024   CREATININE 0.95 10/30/2024   GFRNONAA >60 10/30/2024   CALCIUM  9.7 10/30/2024   PHOS 2.2 (L) 04/27/2021   PROT 7.2 10/30/2024   ALBUMIN 4.2 10/30/2024   BILITOT 0.5 10/30/2024   ALKPHOS 76 10/30/2024   AST 38 10/30/2024   ALT 13 10/30/2024   ANIONGAP 14 10/30/2024   Last lipids Lab Results  Component Value Date   CHOL 269 (H) 06/04/2024   HDL 81 06/04/2024   LDLCALC 149 (H) 06/04/2024   LDLDIRECT 122.0 03/12/2022   TRIG 243 (H) 06/04/2024   CHOLHDL 3.3 06/04/2024   Last hemoglobin A1c Lab Results  Component Value Date   HGBA1C 5.0 06/04/2024   Last thyroid  functions Lab Results  Component Value Date   TSH 1.04 06/04/2024   Last vitamin D  Lab Results  Component Value Date   VD25OH 65 06/04/2024   Last vitamin B12 and Folate Lab Results  Component Value Date   VITAMINB12 584 06/04/2024   FOLATE 17.0 03/22/2016   IMPRESSION AND PLAN:  No problem-specific Assessment & Plan notes found for this encounter.   An After Visit Summary was printed and given  to the patient.  FOLLOW UP: No follow-ups on file.  Signed:  Gerlene Hockey, MD           11/03/2024       [1]  Allergies Allergen Reactions   Ibuprofen Nausea Only    Causes ulcers

## 2024-11-03 NOTE — Telephone Encounter (Signed)
 LM for pt to return call to discuss.

## 2024-11-03 NOTE — Telephone Encounter (Signed)
 No further action needed.

## 2024-11-03 NOTE — Progress Notes (Unsigned)
 OFFICE VISIT  11/03/2024  I connected with Mod[facility] today via virtual platform.  Patient is a 49 y.o. female who presents for 65-month follow-up hypertension, anxiety, and recent hospitalization for syncope.  INTERIM HX: ***  Admitted 1/18 to 10/26/2024 for syncope.  Seizure evaluation normal. Acute kidney injury.  Was taken off lisinopril  and this resolved.  It was surmised that she may have been having orthostatic hypotension as well as postprandial hypoglycemia.    She did have some nonepileptic episodes while in the hospital. EKG normal. She had mild hyperkalemia.  She was given 1 dose of Lokelma  prior to discharge.  She was told to arrange neurology follow-up as an outpatient. Discharge medications: Hold lisinopril . Continue alprazolam  1 mg tabs, 2 tabs twice daily. Tessalon  Perles, calcium , vitamin B12 injections,Estarylla  0.25-35, famotidine  20 mg twice daily, ferrous fumarate  324 mg daily, fluoxetine  40 mg a day, gabapentin  400 mg twice daily, Lamictal  150 mg tab daily, Xyzal  5 mg daily, naloxone , omeprazole  40 mg twice daily, Zofran  ODT 4 mg, Percocet 10/325, Ubrelvy  50 mg, vitamin D  50,000 unit.  10/30/2024 ED visit for dizziness and headache.  Glucose 67 at that time.  CT head without contrast was normal. She was treated with Compazine  and morphine  as well as some dextrose  oral gel.  Currently:  ***  She has a remote history of a generalized tonic-clonic seizure and was seen by the neurologist on 06/22/2014.  An MRI of the brain and an EEG at that time were normal.  No epileptic medications were recommended.  03/04/2021 MRA of the head without contrast, MRI of the brain without contrast, and MRA of the neck with and without contrast--> all normal.  PMP AWARE reviewed today: most recent rx for alprazolam  was filled 11/01/2024, # 120, rx by me.  Her gabapentin  and oxycodone  are prescribed by different providers. No red flags.   Past Medical History:  Diagnosis Date   Acute renal  failure 04/26/2021   prerenal azotemia   Allergic rhinitis    Altered sensorium due to hypoglycemia 03/2016   Anxiety and depression    started with PPD   Chronic neck pain    Myofascial pain syndrome per [facility], oxycodone  per their office pain med contract.  (Normal cervical MRI 2010 per ortho notes).   Easy bruisability 2011   Hx of: saw Hematologist (Dr. Freddie) and w/u neg for von Willebrand desease.   GERD (gastroesophageal reflux disease)    hx gastric ulcer and upper GI bleed, ? 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 Pt to start FeSO4 325 qd 10/28/19   Irregular menses 03/2018   GYN started Femynor  0.25-0.35.  [facility] as of 10/2018.  u/s wnl.  Endom bx NEG. OCPs helped.   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 Kassie   Seizure [facility]) 2015   s/p eval with neuro, no medication, no events since   Syncope    with ? seizure in 2015, eval with neruo   Vitamin B12 deficiency    Vitamin D  deficiency  Past Surgical History:  Procedure Laterality Date   CESAREAN SECTION  2006   COLONOSCOPY W/ POLYPECTOMY     adenomas 11/17/23-->recall 7 yrs   EEG     (sleep) 10/25/24--->no epileptic activity   ELBOW X-RAY Left 12/07/2019   ENDOMETRIAL BIOPSY     EYE SURGERY Bilateral    2001   FINGER X-RAY Right 12/07/2019   ROUX-EN-Y GASTRIC BYPASS  01/05/2002   TONSILLECTOMY AND ADENOIDECTOMY     TUBAL LIGATION  2006   tubes in ears both Bilateral     Outpatient Medications Prior to Visit  Medication Sig Dispense Refill   ALPRAZolam  (XANAX ) 1 MG tablet Take 2 tablets (2 mg total)  by mouth 2 (two) times daily. 120 tablet 0   benzonatate  (TESSALON ) 200 MG capsule Take 1 capsule (200 mg total) by mouth 2 (two) times daily as needed for cough. (Patient not taking: Reported on 10/25/2024) 20 capsule 0   Calcium  250 MG CAPS Take 1 capsule by mouth 3 (three) times daily.     cyanocobalamin  (VITAMIN B12) 1000 MCG/ML injection Inject 1 mL (1,000 mcg total) into the muscle every 2 weeks 10 mL 0   ESTARYLLA  0.25-35 MG-MCG tablet Take 1 tablet by mouth daily.     famotidine  (PEPCID ) 20 MG tablet Take 1 tablet (20 mg total) by mouth 2 (two) times daily. (Patient taking differently: Take 20 mg by mouth daily as needed for heartburn.) 180 tablet 1   Ferrous Fumarate  (HEMOCYTE - 106 MG FE) 324 (106 Fe) MG TABS tablet Take 1 tablet (106 mg of iron  total) by mouth daily. (Patient taking differently: Take 1 tablet by mouth every other day.) 30 tablet 6   FLUoxetine  (PROZAC ) 40 MG capsule Take 1 capsule (40 mg total) by mouth daily. (Patient taking differently: Take 40 mg by mouth at bedtime.) 90 capsule 0   fluticasone  (FLONASE ) 50 MCG/ACT nasal spray Place 2 sprays into both nostrils daily. 30 g 0   gabapentin  (NEURONTIN ) 400 MG capsule Take 1 capsule (400 mg total) by mouth 2 (two) times daily. 60 capsule 2   glucose blood (FREESTYLE LITE) test strip USE TO CHECK BLOOD SUGAR 3 TIMES A DAY 200 strip 12   lamoTRIgine  (LAMICTAL ) 150 MG tablet Take 1 tablet (150 mg total) by mouth daily. (Patient taking differently: Take 150 mg by mouth at bedtime.) 90 tablet 0   levocetirizine (XYZAL ) 5 MG tablet Take 1 tablet (5 mg total) by mouth every evening. 30 tablet 2   naloxone  (NARCAN ) nasal spray 4 mg/0.1 mL Place 1 spray into the nose once. (Patient not taking: No sig reported)     omeprazole  (PRILOSEC) 40 MG capsule Take 1 capsule (40 mg total) by mouth 2 (two) times daily. 180 capsule 1   ondansetron  (ZOFRAN -ODT) 4 MG disintegrating tablet Take 1 tablet (4 mg total) by mouth every 8 (eight) hours as  needed. (Patient not taking: Reported on 10/25/2024) 20 tablet 1   oxyCODONE -acetaminophen  (PERCOCET) 10-325 MG tablet Take 1 tablet by mouth every 4 (four) hours as needed for pain.     Ubrogepant  (UBRELVY ) 50 MG TABS 1-2 tabs po once a day as needed for migraine headache 30 tablet 1   Vitamin D , Ergocalciferol , (DRISDOL ) 1.25 MG (50000 UNIT) CAPS capsule Take 1 capsule (50,000 Units total) by mouth 2 (two) times a week. 24 capsule 1   No facility-administered medications prior to visit.    Allergies[1]  Review of Systems As per HPI  PE:  11/02/2024   10:15 AM 10/30/2024    2:59 PM 10/30/2024    1:15 PM  Vitals with BMI  Height 5' 5    Weight 248 lbs 3 oz    BMI 41.3    Systolic 122  134  Diastolic 72  64  Pulse 80 72 69   Physical Exam  ***  LABS:  Last CBC Lab Results  Component Value Date   WBC 7.7 10/25/2024   HGB 12.5 10/25/2024   HCT 39.1 10/25/2024   MCV 97.8 10/25/2024   MCH 31.3 10/25/2024   RDW 14.5 10/25/2024   PLT 346 10/25/2024   Lab Results  Component Value Date   IRON  71 06/04/2024   TIBC 322 06/04/2024   FERRITIN 49 06/04/2024   Last metabolic panel Lab Results  Component Value Date   GLUCOSE 74 10/30/2024   NA 136 10/30/2024   K 5.0 10/30/2024   CL 102 10/30/2024   CO2 21 (L) 10/30/2024   BUN 15 10/30/2024   CREATININE 0.95 10/30/2024   GFRNONAA >60 10/30/2024   CALCIUM  9.7 10/30/2024   PHOS 2.2 (L) 04/27/2021   PROT 7.2 10/30/2024   ALBUMIN 4.2 10/30/2024   BILITOT 0.5 10/30/2024   ALKPHOS 76 10/30/2024   AST 38 10/30/2024   ALT 13 10/30/2024   ANIONGAP 14 10/30/2024   Last lipids Lab Results  Component Value Date   CHOL 269 (H) 06/04/2024   HDL 81 06/04/2024   LDLCALC 149 (H) 06/04/2024   LDLDIRECT 122.0 03/12/2022   TRIG 243 (H) 06/04/2024   CHOLHDL 3.3 06/04/2024   Last hemoglobin A1c Lab Results  Component Value Date   HGBA1C 5.0 06/04/2024   Last thyroid  functions Lab Results  Component Value Date   TSH  1.04 06/04/2024   Last vitamin D  Lab Results  Component Value Date   VD25OH 65 06/04/2024   Last vitamin B12 and Folate Lab Results  Component Value Date   VITAMINB12 584 06/04/2024   FOLATE 17.0 03/22/2016   IMPRESSION AND PLAN:  No problem-specific Assessment & Plan notes found for this encounter.  Bmet future  An After Visit Summary was printed and given to the patient.  FOLLOW UP: No follow-ups on file.  Signed:  Gerlene Hockey, MD           11/03/2024        [1]  Allergies Allergen Reactions   Ibuprofen Nausea Only    Causes ulcers

## 2024-11-03 NOTE — Telephone Encounter (Signed)
 Copied from CRM #[id number]. Topic: Referral - Question >> Nov 03, 2024  1:55 PM Robinson H wrote: Reason for CRM: Patient missed her appointment with Dr. McGowen today for ED follow up, agent tried rescheduling visit and patient declined appointment stating can provider just give her a new referral to Neurology without coming in. Advised patient of referral protocol and patient states she's seen Neurology before but now they want referral since it's been over a year.   Artist [phone]

## 2024-11-05 ENCOUNTER — Inpatient Hospital Stay: Admitting: Family Medicine

## 2024-11-05 NOTE — Telephone Encounter (Signed)
Rx has been picked up by pt. 

## 2024-11-08 ENCOUNTER — Encounter: Payer: Self-pay | Admitting: Family Medicine

## 2024-11-08 ENCOUNTER — Other Ambulatory Visit: Payer: Self-pay | Admitting: Family Medicine

## 2024-11-08 DIAGNOSIS — R55 Syncope and collapse: Secondary | ICD-10-CM

## 2024-11-08 DIAGNOSIS — Z87898 Personal history of other specified conditions: Secondary | ICD-10-CM

## 2024-11-08 DIAGNOSIS — R404 Transient alteration of awareness: Secondary | ICD-10-CM

## 2024-11-08 DIAGNOSIS — E162 Hypoglycemia, unspecified: Secondary | ICD-10-CM

## 2024-11-08 MED ORDER — GLUCOSE 40 % PO GEL: 1.0000 | Freq: Once | ORAL | 0 refills | Status: AC | PRN

## 2024-11-08 NOTE — Telephone Encounter (Signed)
 Dextrose  and neuro ref ordered.

## 2024-11-08 NOTE — Progress Notes (Unsigned)
 Referral and dextrose  ordered

## 2024-11-09 MED ORDER — LANCET DEVICE MISC: 1.0000 | 0 refills | Status: AC

## 2024-11-09 MED ORDER — BLOOD GLUCOSE MONITORING SUPPL DEVI: 1.0000 | 0 refills | Status: AC

## 2024-11-09 MED ORDER — ALCOHOL PREP PAD 70 % PADS: MEDICATED_PAD | 5 refills | Status: AC

## 2024-11-09 MED ORDER — BLOOD GLUCOSE TEST VI STRP: 1.0000 | ORAL_STRIP | 0 refills | Status: AC

## 2024-11-09 MED ORDER — LANCETS MISC: 1.0000 | 0 refills | Status: AC

## 2024-11-09 NOTE — Telephone Encounter (Signed)
 Rx sent no further action needed at this time

## 2024-11-19 ENCOUNTER — Ambulatory Visit: Admitting: Family Medicine

## 2024-11-22 ENCOUNTER — Inpatient Hospital Stay (HOSPITAL_COMMUNITY): Admission: RE | Admit: 2024-11-22 | Payer: PRIVATE HEALTH INSURANCE | Source: Ambulatory Visit | Admitting: Neurology

## 2024-11-30 ENCOUNTER — Telehealth (HOSPITAL_COMMUNITY): Admitting: Psychiatry

## 2024-12-03 ENCOUNTER — Ambulatory Visit: Payer: PRIVATE HEALTH INSURANCE | Admitting: Family Medicine
